# Patient Record
Sex: Female | Born: 1935 | Race: White | Hispanic: No | Smoking: Never smoker
Health system: Southern US, Community
[De-identification: ages and names within clinical notes are randomized; demographics above are authoritative.]

## PROBLEM LIST (undated history)

## (undated) DIAGNOSIS — I10 Essential (primary) hypertension: Secondary | ICD-10-CM

## (undated) DIAGNOSIS — E785 Hyperlipidemia, unspecified: Secondary | ICD-10-CM

## (undated) DIAGNOSIS — M1990 Unspecified osteoarthritis, unspecified site: Secondary | ICD-10-CM

## (undated) DIAGNOSIS — I2510 Atherosclerotic heart disease of native coronary artery without angina pectoris: Secondary | ICD-10-CM

## (undated) DIAGNOSIS — H269 Unspecified cataract: Secondary | ICD-10-CM

## (undated) HISTORY — PX: SPLENECTOMY, TOTAL: SHX788

## (undated) HISTORY — PX: BACK SURGERY: SHX140

## (undated) HISTORY — PX: CARPAL TUNNEL RELEASE: SHX101

## (undated) HISTORY — PX: ABDOMINAL HYSTERECTOMY: SHX81

## (undated) HISTORY — PX: APPENDECTOMY: SHX54

## (undated) HISTORY — PX: HERNIA REPAIR: SHX51

## (undated) HISTORY — PX: KNEE SURGERY: SHX244

## (undated) HISTORY — PX: SHOULDER SURGERY: SHX246

## (undated) HISTORY — DX: Unspecified cataract: H26.9

---

## 1998-09-01 ENCOUNTER — Ambulatory Visit (HOSPITAL_COMMUNITY): Admission: RE | Admit: 1998-09-01 | Discharge: 1998-09-01 | Payer: Self-pay | Admitting: Geriatric Medicine

## 1998-10-15 ENCOUNTER — Encounter: Payer: Self-pay | Admitting: Gastroenterology

## 1998-10-15 ENCOUNTER — Ambulatory Visit (HOSPITAL_COMMUNITY): Admission: RE | Admit: 1998-10-15 | Discharge: 1998-10-15 | Payer: Self-pay | Admitting: Gastroenterology

## 1998-11-04 ENCOUNTER — Other Ambulatory Visit: Admission: RE | Admit: 1998-11-04 | Discharge: 1998-11-04 | Payer: Self-pay | Admitting: Obstetrics and Gynecology

## 2000-03-17 ENCOUNTER — Other Ambulatory Visit: Admission: RE | Admit: 2000-03-17 | Discharge: 2000-03-17 | Payer: Self-pay | Admitting: Obstetrics and Gynecology

## 2000-06-17 ENCOUNTER — Encounter: Admission: RE | Admit: 2000-06-17 | Discharge: 2000-06-17 | Payer: Self-pay | Admitting: Geriatric Medicine

## 2000-06-17 ENCOUNTER — Encounter: Payer: Self-pay | Admitting: Geriatric Medicine

## 2000-10-24 ENCOUNTER — Encounter: Admission: RE | Admit: 2000-10-24 | Discharge: 2000-10-24 | Payer: Self-pay | Admitting: Neurosurgery

## 2000-10-24 ENCOUNTER — Encounter: Payer: Self-pay | Admitting: Neurosurgery

## 2000-10-28 ENCOUNTER — Ambulatory Visit (HOSPITAL_COMMUNITY): Admission: RE | Admit: 2000-10-28 | Discharge: 2000-10-28 | Payer: Self-pay | Admitting: Neurosurgery

## 2000-10-28 ENCOUNTER — Encounter: Payer: Self-pay | Admitting: Neurosurgery

## 2000-11-15 ENCOUNTER — Encounter: Payer: Self-pay | Admitting: Neurosurgery

## 2000-11-15 ENCOUNTER — Encounter: Admission: RE | Admit: 2000-11-15 | Discharge: 2000-11-15 | Payer: Self-pay | Admitting: Neurosurgery

## 2000-11-29 ENCOUNTER — Encounter: Admission: RE | Admit: 2000-11-29 | Discharge: 2000-11-29 | Payer: Self-pay | Admitting: Neurosurgery

## 2000-11-29 ENCOUNTER — Encounter: Payer: Self-pay | Admitting: Neurosurgery

## 2000-12-06 ENCOUNTER — Ambulatory Visit (HOSPITAL_COMMUNITY): Admission: RE | Admit: 2000-12-06 | Discharge: 2000-12-06 | Payer: Self-pay | Admitting: Interventional Cardiology

## 2000-12-13 ENCOUNTER — Encounter: Admission: RE | Admit: 2000-12-13 | Discharge: 2000-12-13 | Payer: Self-pay | Admitting: Neurosurgery

## 2000-12-13 ENCOUNTER — Encounter: Payer: Self-pay | Admitting: Neurosurgery

## 2001-04-05 ENCOUNTER — Other Ambulatory Visit: Admission: RE | Admit: 2001-04-05 | Discharge: 2001-04-05 | Payer: Self-pay | Admitting: Obstetrics and Gynecology

## 2001-04-14 ENCOUNTER — Encounter: Payer: Self-pay | Admitting: Neurosurgery

## 2001-04-14 ENCOUNTER — Encounter: Admission: RE | Admit: 2001-04-14 | Discharge: 2001-04-14 | Payer: Self-pay | Admitting: Neurosurgery

## 2002-07-18 ENCOUNTER — Encounter: Admission: RE | Admit: 2002-07-18 | Discharge: 2002-07-18 | Payer: Self-pay | Admitting: Geriatric Medicine

## 2002-07-18 ENCOUNTER — Encounter: Payer: Self-pay | Admitting: Geriatric Medicine

## 2003-05-13 ENCOUNTER — Encounter: Payer: Self-pay | Admitting: Geriatric Medicine

## 2003-05-13 ENCOUNTER — Encounter: Admission: RE | Admit: 2003-05-13 | Discharge: 2003-05-13 | Payer: Self-pay | Admitting: Geriatric Medicine

## 2003-06-12 ENCOUNTER — Ambulatory Visit (HOSPITAL_COMMUNITY): Admission: RE | Admit: 2003-06-12 | Discharge: 2003-06-12 | Payer: Self-pay | Admitting: Gastroenterology

## 2003-11-06 ENCOUNTER — Other Ambulatory Visit: Admission: RE | Admit: 2003-11-06 | Discharge: 2003-11-06 | Payer: Self-pay | Admitting: Obstetrics and Gynecology

## 2004-06-17 ENCOUNTER — Encounter: Admission: RE | Admit: 2004-06-17 | Discharge: 2004-06-17 | Payer: Self-pay | Admitting: Geriatric Medicine

## 2004-06-24 ENCOUNTER — Encounter: Admission: RE | Admit: 2004-06-24 | Discharge: 2004-06-24 | Payer: Self-pay | Admitting: Geriatric Medicine

## 2004-07-29 ENCOUNTER — Encounter: Admission: RE | Admit: 2004-07-29 | Discharge: 2004-07-29 | Payer: Self-pay | Admitting: Geriatric Medicine

## 2004-09-25 ENCOUNTER — Emergency Department (HOSPITAL_COMMUNITY): Admission: EM | Admit: 2004-09-25 | Discharge: 2004-09-25 | Payer: Self-pay | Admitting: Emergency Medicine

## 2005-01-08 ENCOUNTER — Ambulatory Visit (HOSPITAL_COMMUNITY): Admission: RE | Admit: 2005-01-08 | Discharge: 2005-01-08 | Payer: Self-pay | Admitting: Geriatric Medicine

## 2005-01-10 ENCOUNTER — Inpatient Hospital Stay (HOSPITAL_COMMUNITY): Admission: EM | Admit: 2005-01-10 | Discharge: 2005-01-22 | Payer: Self-pay | Admitting: Emergency Medicine

## 2005-01-12 ENCOUNTER — Encounter (INDEPENDENT_AMBULATORY_CARE_PROVIDER_SITE_OTHER): Payer: Self-pay | Admitting: Specialist

## 2005-06-29 ENCOUNTER — Ambulatory Visit (HOSPITAL_COMMUNITY): Admission: RE | Admit: 2005-06-29 | Discharge: 2005-06-29 | Payer: Self-pay | Admitting: Gastroenterology

## 2005-07-12 ENCOUNTER — Encounter: Admission: RE | Admit: 2005-07-12 | Discharge: 2005-07-12 | Payer: Self-pay | Admitting: Geriatric Medicine

## 2005-07-12 IMAGING — US US EXTREM LOW VENOUS*R*
1 series · 14 of 24 positions shown · non-contrast
Comparison: none

CLINICAL DATA: Right lower extremity pain and swelling.

RIGHT LOWER EXTREMITY VENOUS DUPLEX DOPPLER ULTRASOUND  [DATE]:
TECHNIQUE: Gray-scale sonography with compression and color and duplex Doppler
ultrasound were performed to evaluate the deep venous system from the level of
the common femoral vein through the popliteal and proximal calf veins. The
greater saphenous vein was also evaluated with compression.

[Series 1: unknown · 14 of 38 slices shown]
[im 1/38]
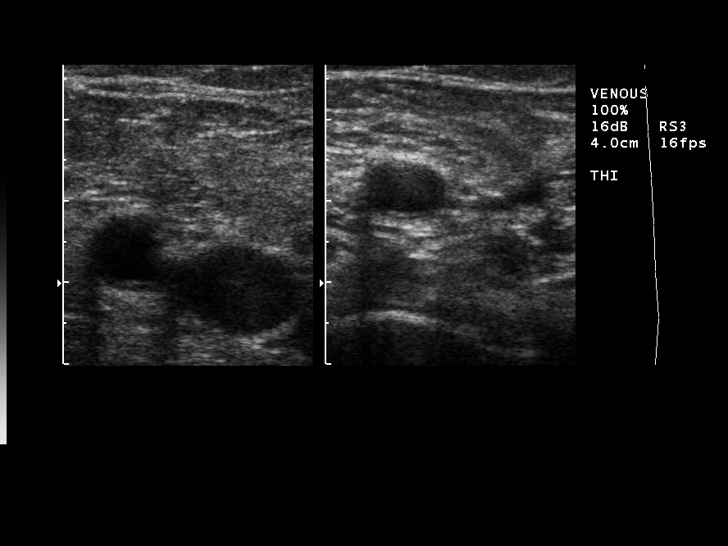
[im 4/38]
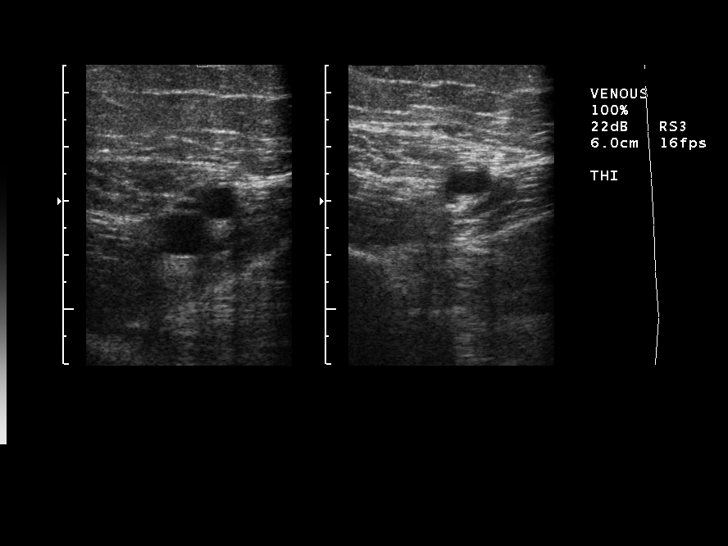
[im 7/38]
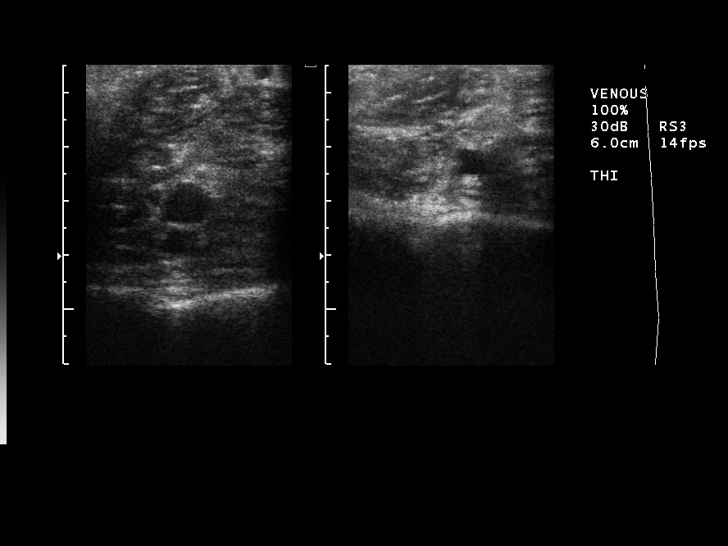
[im 10/38]
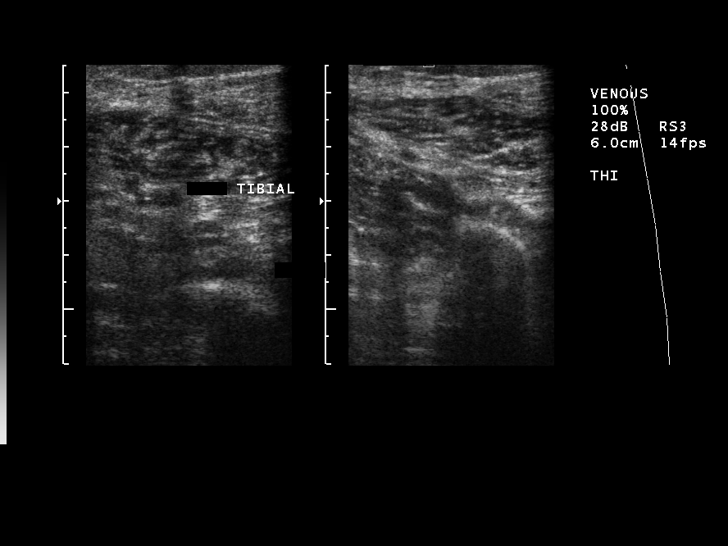
[im 12/38]
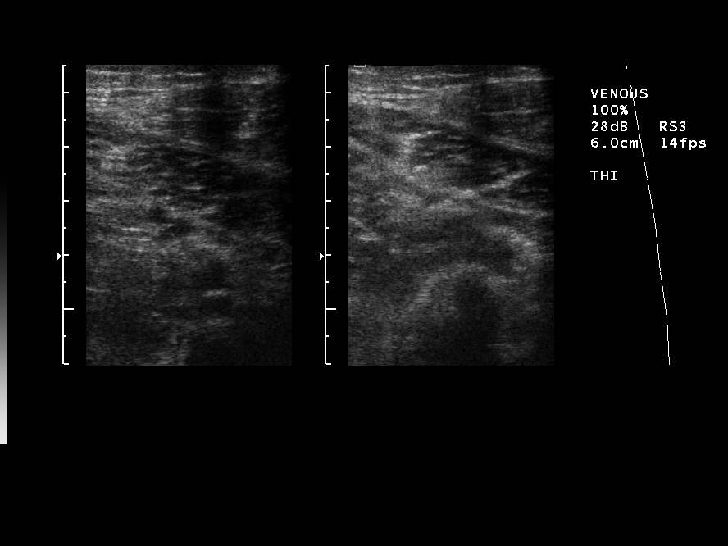
[im 15/38]
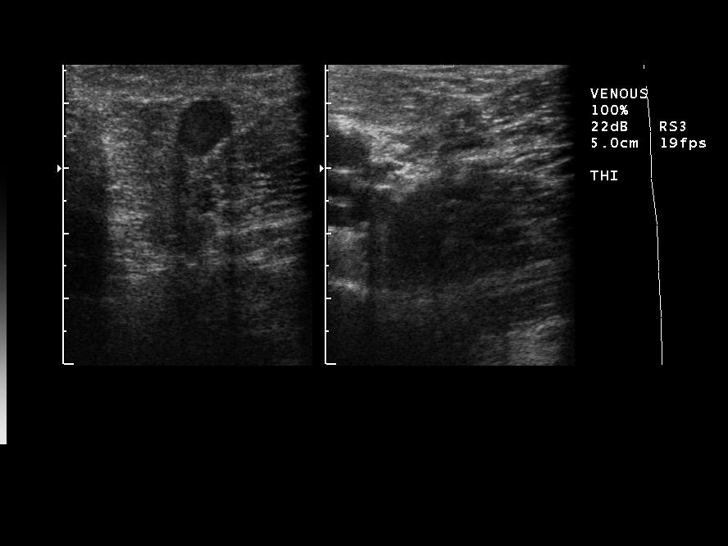
[im 18/38]
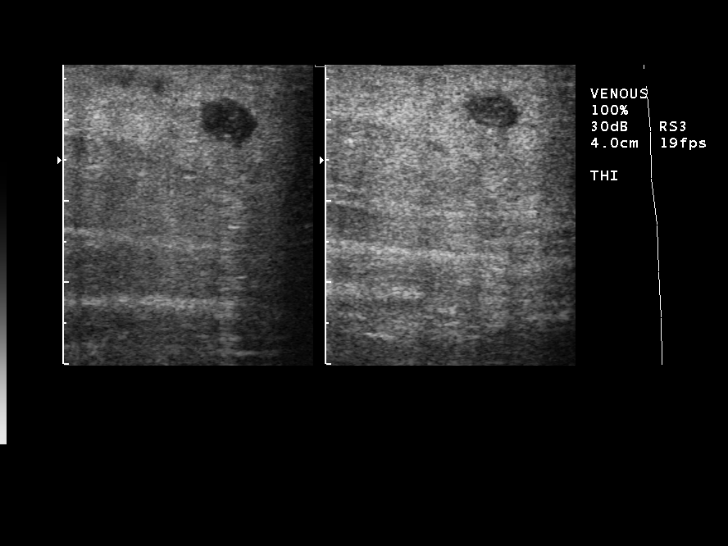
[im 20/38]
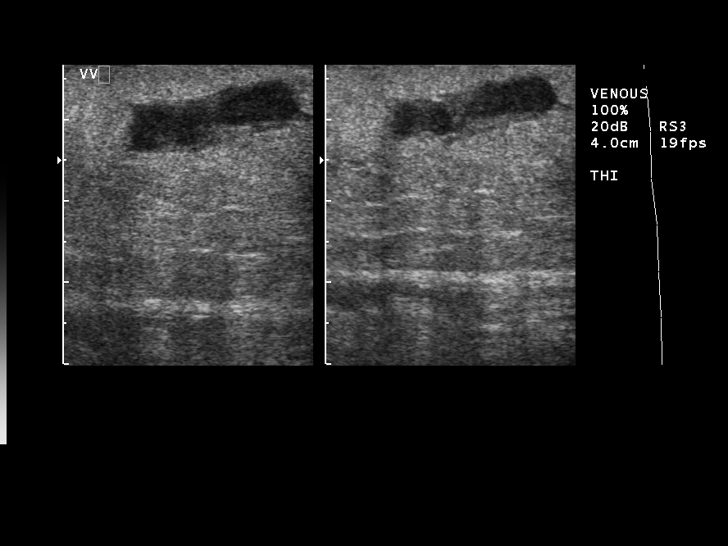
[im 23/38]
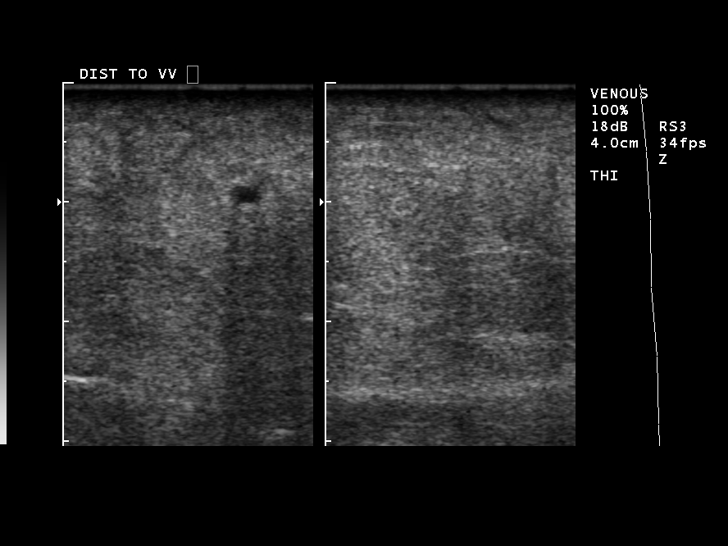
[im 26/38]
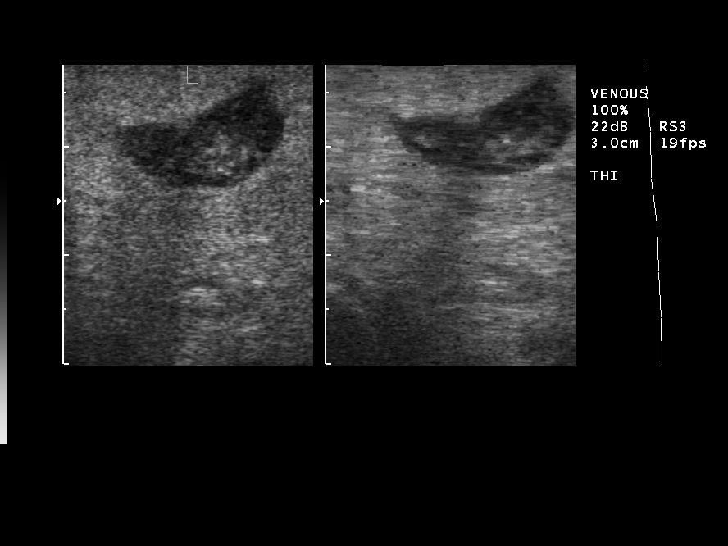
[im 29/38]
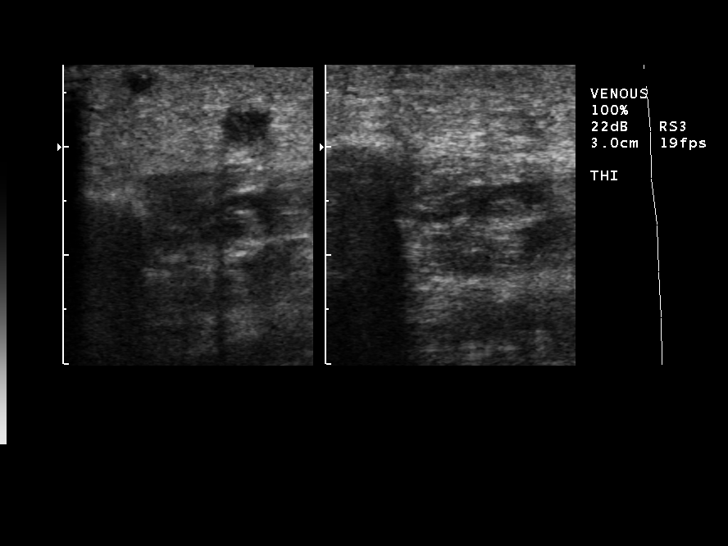
[im 31/38]
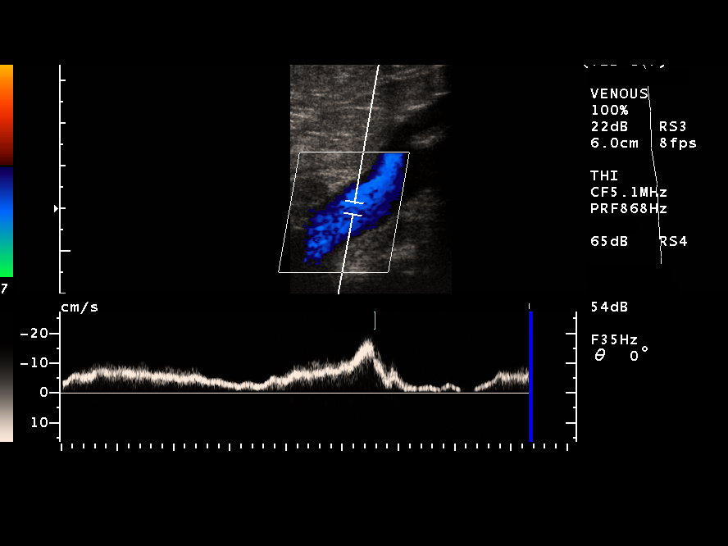
[im 34/38]
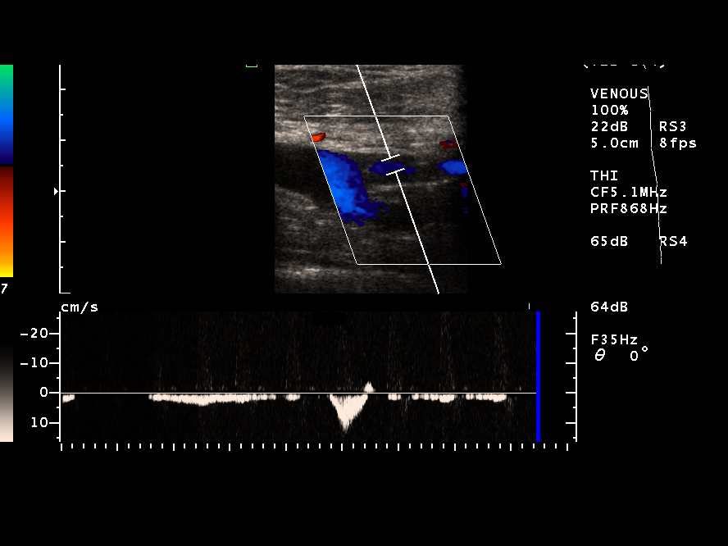
[im 38/38]
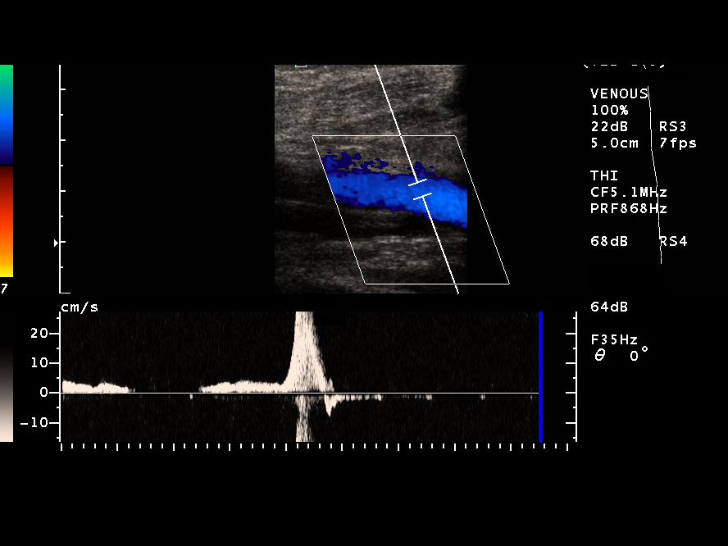

[14 of 24 positions shown; findings below may reference images not displayed]

FINDINGS: The deep venous system is widely patent, with normal compressibility
and normal augmentation to flow in all of the deep veins evaluated, including
the proximal calf veins. However, there is extensive thrombus involving the
greater saphenous vein in the lower leg below the knee. There are varicosities
versus collateral superficial veins in the lower leg.
IMPRESSION: 1. No evidence of deep venous thrombosis.
2. Superficial thrombophlebitis involving the greater saphenous vein below the
knee. There are varicosities and/or collateral superficial veins in the lower
leg.

## 2006-02-14 ENCOUNTER — Encounter: Admission: RE | Admit: 2006-02-14 | Discharge: 2006-02-14 | Payer: Self-pay | Admitting: Orthopedic Surgery

## 2006-02-17 ENCOUNTER — Encounter (INDEPENDENT_AMBULATORY_CARE_PROVIDER_SITE_OTHER): Payer: Self-pay | Admitting: Specialist

## 2006-02-17 ENCOUNTER — Ambulatory Visit (HOSPITAL_BASED_OUTPATIENT_CLINIC_OR_DEPARTMENT_OTHER): Admission: RE | Admit: 2006-02-17 | Discharge: 2006-02-17 | Payer: Self-pay | Admitting: Orthopedic Surgery

## 2006-11-09 ENCOUNTER — Encounter: Admission: RE | Admit: 2006-11-09 | Discharge: 2006-11-09 | Payer: Self-pay | Admitting: Orthopedic Surgery

## 2008-03-25 ENCOUNTER — Encounter: Admission: RE | Admit: 2008-03-25 | Discharge: 2008-03-25 | Payer: Self-pay | Admitting: Orthopedic Surgery

## 2008-03-28 ENCOUNTER — Ambulatory Visit (HOSPITAL_BASED_OUTPATIENT_CLINIC_OR_DEPARTMENT_OTHER): Admission: RE | Admit: 2008-03-28 | Discharge: 2008-03-29 | Payer: Self-pay | Admitting: Orthopedic Surgery

## 2010-06-04 ENCOUNTER — Ambulatory Visit (HOSPITAL_COMMUNITY): Admission: RE | Admit: 2010-06-04 | Discharge: 2010-06-04 | Payer: Self-pay | Admitting: Gastroenterology

## 2010-12-29 NOTE — Op Note (Signed)
[name], [name]                ACCOUNT NO.:  [id number]   MEDICAL RECORD NO.:  [id number]          PATIENT TYPE:  AMB   LOCATION:  DSC                          FACILITY:  [facility]   PHYSICIAN:  Katy Fitch. Sypher, M.D. DATE OF BIRTH:  November 18, 1928   DATE OF PROCEDURE:  03/28/2008  DATE OF DISCHARGE:                               OPERATIVE REPORT   PREOPERATIVE DIAGNOSES:  1. Right rotator cuff tear, documented by preoperative MRI.  2. Advanced degenerative arthritis. right acromioclavicular joint.  3. Unfavorable acromial anatomy.   POSTOPERATIVE DIAGNOSES:  1. Degenerative rotator cuff tear involving entire supraspinatus and      infraspinatus rotator cuff tendons.  2. Chronic acromioclavicular arthropathy.  3. Unfavorable acromial anatomy.  4. Synovitis and minimal adhesive capsulitis, right glenohumeral      joint.   OPERATION:  1. Examination of right shoulder under anesthesia.  2. Diagnostic arthroscopy, right glenohumeral joint followed by      arthroscopic debridement of adhesive capsulitis and deep surface of      rotator cuff tear with photographic documentation of rotator cuff      tear.  3. Arthroscopic subacromial decompression with coracoacromial ligament      relaxation, acromioplasty, and bursectomy.  4. Arthroscopic distal clavicle resection.  5. Open repair of supraspinatus and infraspinatus rotator cuff tear      utilizing two medial footprint Bio-Corkscrew anchors and suture-      bridge technique followed by over-the-top repair to lateral      SwiveLock and a single McLaughlin through bone suture to lateralize      the conjoined tendon.   OPERATION SURGEON:  Katy Fitch. Sypher, MD   ASSISTANT:  Marveen Reeks Dasnoit, PA-C   ANESTHESIA:  General endotracheal supplemented by a right interscalene  block.   SUPERVISING ANESTHESIOLOGIST:  Zenon Mayo, MD   INDICATIONS:  [name] is a 77 year old woman well known to our  practice.  We have cared for  her for number of upper extremity  orthopedic predicaments over the years.  Her primary care physician is  Hal Stoneking.   Recently, she presented for evaluation of a chronically painful right  shoulder.  Clinical examination revealed signs of significant AC  arthropathy, impingement and weakness of abduction and external  rotation.   Plain x-rays confirmed AC arthropathy and unfavorable acromial anatomy.  She had sclerotic change in greater tuberosity consistent with a rotator  cuff tear.   An MRI of the shoulder confirmed a retracted rotator cuff tear.   Due to her failure to respond to nonoperative measures, she is brought  to the operating at this time anticipating reconstruction of her right  rotator cuff and appropriate decompression.   PROCEDURE:  [name] was brought to the operating room and placed in  supine position on the operating table.   Preoperatively, she was interviewed by Dr. Sampson Goon of the Anesthesia  Service.  He advised her through informed consent of general anesthesia  by endotracheal technique supplemented by right interscalene block.  After her consent was achieved, an interscalene block was placed in the  holding  area without complication.  She was brought to room-1, placed in  supine position on the operating table, and under Dr. Jarrett Ables  direct supervision, general anesthesia induced by endotracheal  technique.  [name] was positioned in the beach chair position with the  aide of a torso and head holder designed for shoulder arthroscopy.  The  entire right upper extremity and forequarter prepped with DuraPrep and  draped with impervious arthroscopy drapes.   The shoulder was distended with 20 mL of sterile saline with a spinal  needle followed by placing the arthroscope through a standard posterior  viewing portal.  Diagnostic arthroscopy revealed moderate adhesive  capsulitis changes and synovitis.  An anterior port was created under   direct vision, and a synovectomy accomplished as well as limited  debridement of adhesive capsulitis tissues, tenolysing the subscapularis  tendon.   The deep surface of the rotator cuff was debrided and a full-thickness  tear involving the entire supraspinatus and infraspinatus tendon was  confirmed.   This was documented in the digital camera followed by removal the  arthroscope from the glenohumeral joint and placement in the subacromial  space.   After bursectomy was accomplished, coracoacromial ligament was relaxed  with the cutting cautery followed by leveling of the acromion to a type  1 morphology.  The distal 15 mm of clavicle was resected  arthroscopically.  After hemostasis was achieved, we proceeded to open  repair of the cuff.  A 4-cm muscle-splitting incision was fashioned  between the anterior and middle thirds of deltoid.  The bursa was  resected followed by debridement of the margin of the tear.  The deep  surface was debrided with a suction shaver and a rongeur.  The greater  tuberosity was decorticated and its profile lowered about 3 mm with a  power bur followed by placement 2 medial anchors utilizing 5.5-mm PEEK  and FiberWire.  The 4-0 medial mattress sutures were placed insetting  the margin of the repair anatomically followed by use of McLaughlin  through bone suture to lateralize the conjoined tendon.  The repair was  finished with a pair of over-the-top sutures to a lateral SwiveLock.   A low profile repair was achieved.  The lateral margin of the acromion  was examined and found to be not problematic.  The deltoid split was  then repaired with simple suture of 0 Vicryl and an apical corner suture  grasping style of #2 FiberWire.   The skin was repaired with subdermal sutures of 2-0 Vicryl and  intradermal 3-0 Prolene with Steri-Strips.   [name] was placed in sling and transferred to recovery room with  stable vital signs.  She will be admitted to  recovery care center for  appropriate management of her pain with IV PCA morphine on a p.r.n.  basis and p.o. medications as ordered.   She was provided Ancef 1 g IV q.8 h. x3 doses as a prophylactic  antibiotic.   We anticipate discharge 24 hours to home.  We will reexamine her on the  morning of March 29, 2008, prior to discharge.      Katy Fitch Sypher, M.D.  Electronically Signed     RVS/MEDQ  D:  03/28/2008  T:  03/29/2008  Job:  [id number]   cc:   Hal T. Stoneking, M.D.

## 2011-01-01 NOTE — H&P (Signed)
NAMEEMMALIN, JAQUESS NO.:  [id number]   MEDICAL RECORD NO.:  [id number]          PATIENT TYPE:  INP   LOCATION:  1830                         FACILITY:  [facility]   PHYSICIAN:  Gabrielle Dare. Janee Morn, M.D.DATE OF BIRTH:  1929/01/28   DATE OF ADMISSION:  01/10/2005  DATE OF DISCHARGE:                                HISTORY & PHYSICAL   CHIEF COMPLAINT:  Painful hernia.   HISTORY OF PRESENT ILLNESS:  The patient is a 77 year old white female with  a multiple year history of lower abdominal hernia associated with her  previous Pfannenstiel incision. She presents complaining of increased pain  and nausea since Friday. She was evaluated by her primary care physician on  Friday who sent her for a HIDA scan. This was unremarkable. She continued to  have pain and nausea through the weekend and she felt some redness over her  lower abdomen over the hernia and she came to [facility]  Emergency Department.   Abdominal series shows dilated colon and small bowel. White blood cell count  is elevated at 17.2. She continues to have pain.   PAST MEDICAL HISTORY:  1.  Hypertension.  2.  Hypercholesterolemia.   PAST SURGICAL HISTORY:  1.  Hysterectomy.  2.  Bladder tacking.  3.  Splenectomy.   MEDICATIONS:  Premarin, Ditropan, hydrochlorothiazide 25 mg daily, Bextra,  Zetia, and Ocuvite.   PHYSICAL EXAMINATION:  GENERAL:  She is awake and alert. She is in mild  distress.  NECK:  Supple.  HEENT:  Pupils are reactive and her sclerae are clear.  LUNGS:  Clear to auscultation. Respiratory excursion is normal.  HEART:  Regular rate and rhythm. No murmurs are noted.  ABDOMEN:  Soft but the lower abdomen has a palpable hernia just to the left  of the midline and some erythema of the overlying skin extending across the  left lower quadrant. Hernia is not reducible and is tender. Bowel sounds  were present and occasionally high pitched. No organomegaly noted.  SKIN:   Warm and dry.  MUSCULOSKELETAL:  Strength is equal in the upper and lower extremities  without noted deficit.   LABORATORY DATA:  Data reviewed includes abdominal films as above. White  blood cell count 17.2, hemoglobin 15.9, platelets 194,000. PT is 12.8, INR  1.0. Chemistry panel is pending. EKG is also pending.   IMPRESSION:  A 77 year old female with an incarcerated incisional hernia  with possible ischemic bowel.   PLAN:  Plan will be to take her to the OR for emergency exploration and  repair of this hernia. She may also require bowel resection and possible  colostomy. The procedure, risks, and benefits were discussed in detail with  the patient and her son and she is agreeable with proceeding.      BET/MEDQ  D:  01/10/2005  T:  01/10/2005  Job:  [id number]

## 2011-01-01 NOTE — Op Note (Signed)
[name], [name]                ACCOUNT NO.:  [id number]   MEDICAL RECORD NO.:  [id number]          PATIENT TYPE:  AMB   LOCATION:  DSC                          FACILITY:  [facility]   PHYSICIAN:  [name], M.D. DATE OF BIRTH:  22-Jun-1929   DATE OF PROCEDURE:  02/17/2006  DATE OF DISCHARGE:                                 OPERATIVE REPORT   PREOPERATIVE DIAGNOSES:  1.  Chronic entrapment neuropathy, right median nerve at carpal tunnel.  2.  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   POSTOPERATIVE DIAGNOSES:  1.  Chronic entrapment neuropathy, right median nerve at carpal tunnel.  2.  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  Identification of a complex      ganglion developing on the wall of the arterial branch of the dorsal      radial artery perforating the first web between the adductor pollicis      and first dorsal interosseous muscles.   OPERATION:  1.  Release of right transverse carpal ligament.  2.  Resection of complex first web ganglion.   OPERATIONS:  [name], M.D.  No assistant.   ANESTHESIA:  General by LMA.  Supervising anesthesiologist is Dr. Krista Blue.   INDICATIONS:  [name] is a 77 year old woman referred through the  courtesy of Dr. Merlene [name] for evaluation and management of hand numbness  and a mass on the dorsal aspect of her right thumb index webspace.   Clinical examination and past electrodiagnostic studies have confirmed  carpal tunnel syndrome, unresponsive to nonoperative measures including  splinting, activity modification and ulnar bursa injection with steroid.   Also [name] noted a mass developing in her thumb index web space  consistent with a ganglion associated with the arterial branch connecting  the dorsal radial artery to the deep palmar arch.   She requested  that we resect this ganglion if possible.   I advised that these could be complex and often involve the arterial  structures in this region.  Sometimes they also are noted to have a origin  on the carpometacarpal joint capsules on the palmar aspect of the hand and  can be difficult to eradicate.   After informed consent, she is brought to the operating room at this time.  Preoperatively we discussed potential risks and benefits of surgery in  detail.   PROCEDURE:  [name] is brought to the operating room and placed in  supine position on the operating table.   Following the induction of general anesthesia by LMA technique, the right  arm was prepped with Betadine soap solution, sterilely draped.   Following exsanguination of the right arm with Esmarch bandage, the arterial  tourniquet slated to inflated to 220 mmHg later elevated to 250 mmHg due to  systolic hypertension.   The procedure commenced with short incision in the line of the ring finger  in the palm.  The  subcutaneous tissues were carefully divided revealing the  palmar fascia.  This split longitudinally to reveal common sensory branch of  median nerve.  These were followed back to transverse carpal ligament.  The  superficial palmar arch and ulnar artery identified.  The ulnar aspect of  the transverse carpal ligament was released with scissors extending into the  distal forearm including subcutaneous release of the volar forearm fascia.   This widely opened the carpal canal.   Due to elevation of the systolic blood pressure we noted some bleeding.  Therefore the tourniquet released and the arm was re-exsanguinated with  reinflation of the tourniquet to 250 mmHg.   The palmar wound was repaired with intradermal 3-0 Prolene and Steri-Strips.  2% lidocaine was infiltrated for postoperative analgesia along the wound  margins.  Attention was then directed to the dorsal aspect of the hand.  The  ganglion was easily  palpable between the abductor pollicis and first dorsal  interosseous muscles.  The skin was incised longitudinally directly over the  mass.  The dorsal radial sensory branch to the index metacarpal region was  gently identified and retracted in radial direction.  Several dorsal veins  were dissected free from the mass.  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  This circumferentially dissected  and was found to be exiting a fascial plane adjacent to the perforating  branch of the dorsal radial artery connecting to the deep palmar arch.  This  directly involved the wall the artery.  This circumferentially dissected  through the adventitia of the artery and after circumferential dissection  was removed.  Bleeding points along the margin of the arterial adventitia  were electrocauterized with bipolar current.  Care was taken not to violate  the muscular layer nor the intima.   Past experience with similar lesions has revealed a relatively high risk of  recurrence.  We will share this with the [name] family postoperatively.  I did  not make an attempt to follow the ganglion into the deep palmar space.   The wound was then inspected for bleeding points, tourniquet released, there  was no bleeding noted, followed by repair the skin with intradermal 3-0  Prolene and Steri-Strips.  2% lidocaine was infiltrated for postoperative  analgesia followed by dressing the hand with sterile gauze, sterile Webril  and a volar plaster splint maintaining the wrist in 10 degrees of  dorsiflexion.   There were no apparent complications.      [name], M.D.  Electronically Signed     RVS/MEDQ  D:  02/17/2006  T:  02/17/2006  Job:  [id number]

## 2011-01-01 NOTE — Cardiovascular Report (Signed)
. [facility]  Patient:    [name], [name]                       MRN: [id number] Proc. Date: 12/06/00 Adm. Date:  [id number] Attending:  Lyn Records. Iii CC:         Hal T. Stoneking, M.D.   Cardiac Catheterization  INDICATION FOR STUDY:  Exertional dyspnea, chest discomfort and abnormal Cardiolite.  PROCEDURE PERFORMED: 1. Left heart catheterization. 2. Selective coronary angiography. 3. Left ventriculography.  DESCRIPTION OF PROCEDURE:  After informed consent, a 6-French sheath was inserted into the right femoral artery using the modified Seldinger technique. A 6-French A2 multipurpose catheter was used for left ventriculography by hand injection and right coronary angiography.  The hemodynamic recordings were also made with this catheter.  A 6-French #4 left Judkins catheter was used for left coronary angiography.  The patient tolerated the procedure without complications.  RESULTS:  I.  HEMODYNAMIC DATA:     A. Aortic pressure: 161/75.     b. Left ventricular pressure: 161/19.  II. LEFT VENTRICULOGRAPHY:  Left ventricular cavity is small.  Overall LV function was normal.  EF is 70%.  No mitral regurgitation was noted.  III. CORONARY ANGIOGRAPHY:      A. Left main coronary:  Normal.      B. Left anterior descending coronary artery:  The LAD is relatively         small.  It reaches the apex, but does not go around the apex.  There         is a very eccentric, somewhat complex proximal LAD lesion that         angiographically appeared to be no more than 50% in multiple views.         The diagonal, which arises 3-4 mm beyond this contained a segmental         50-60% proximal narrowing.  Antegrade flow throughout the LAD was         brisk.      C. Circumflex artery:  Three obtuse marginal branches arise from a normal         circumflex.  The obtuse marginal branches are normal.      D. Right coronary artery:  The right coronary artery  was large and free         of any significant obstruction.  A large PDA and left ventricular         branch arise from it.  CONCLUSIONS: 1. Normal LV function. 2. Moderate to moderately severe coronary artery disease with an eccentric    50-60% proximal LAD and 60% first diagonal lesion.  PLAN:  Medical therapy to include risk factor modification with statins therapy and aspirin therapy.  If symptoms become aggressive and/or refractory coronary intervention on this particular lesion would be easy and quite approachable. DD:  12/06/00 TD:  12/06/00 Job: [id number]

## 2011-01-01 NOTE — Discharge Summary (Signed)
NAMENAYA, ILAGAN NO.:  [id number]   MEDICAL RECORD NO.:  [id number]          PATIENT TYPE:  INP   LOCATION:  5742                         FACILITY:  [facility]   PHYSICIAN:  Guy Franco, P.A.       DATE OF BIRTH:  03-22-1929   DATE OF ADMISSION:  01/10/2005  DATE OF DISCHARGE:                                 DISCHARGE SUMMARY   DISCHARGE DIAGNOSES:  1.  Incarcerated incisional hernia, necrotic omentum with a viable sigmoid      colon, status post exploratory laparotomy, partial omentectomy and      repair of incarcerated incisional hernia by Dr. Violeta Gelinas on Jan 10, 2005.  2.  Hypertension, treated.  3.  Hypercholesterolemia, treated.  4.  Status post hysterectomy.  5.  Status post bladder tacking.  6.  Status post splenectomy.   HOSPITAL COURSE:  [name] is a 77 year old female with a multiple year  history of abdominal hernia and had a several day history of increased pain  in that area.  She was evaluated by her primary care physician who  apparently felt this may be her gallbladder.  She was sent for a HIDA scan  that was unremarkable.  She continued to have pain, as well as nausea over  the next several days and the proceeded to the emergency room.  The  abdominal series showed dilated colon, as well as small bowel.  Her white  count was elevated at 17,200.  On exam, she was found to have an  incarcerated incisional hernia and a question of possible ischemic bowel.   She was taken to the operating room on the same day and she was found to  have an incarcerated incisional hernia with necrotic omentum.  She underwent  an exploratory laparotomy, partial omentectomy and repair of the  incarcerated incisional hernia by Dr. Violeta Gelinas.  She seemed to  tolerate the procedure well and was transferred to the surgical ward.  Postoperatively, she did have some redness around her incision and she was  given IV Ancef.  The incision was clean with no  redness or erythema by  discharge.  Her diet was gradually increased over the next several days and  by postoperative day #10, it was felt that she was ready for discharge to  home.  She did have some elevated blood pressures during her hospital stay.  I did speak with Dr. Pete Glatter, who asked me to have her follow up  immediately after her hospitalization, so that he could treat her as an  outpatient for her blood pressure.   DISCHARGE MEDICATIONS:  1.  Continue hydrochlorothiazide, Bextra, Zetia, Ditropan and Premarin.  2.  Her new medications include Protonix 40 mg a day, Reglan q.i.d., Vicodin      1-2 tablets q.6h. as needed for pain.   She is not to lift over 10 pounds.  She is to remain on a low fat diet.  She  is to clean her incisions gently with soap and water and call for  temperature greater than 101.  I have asked her to follow up with Dr. Violeta Gelinas in the next two to three weeks and I have made this appointment for  her.      LB/MEDQ  D:  01/20/2005  T:  01/20/2005  Job:  [id number]   cc:   Gabrielle Dare. Janee Morn, M.D.  [facility]  [address] [place], [place] [address]   Hal T. Stoneking, M.D.  [address] [place], [place] [address]  Fax: [phone]

## 2011-01-01 NOTE — Op Note (Signed)
[name], [name] NO.:  [id number]   MEDICAL RECORD NO.:  [id number]          PATIENT TYPE:  INP   LOCATION:  1830                         FACILITY:  [facility]   PHYSICIAN:  [name]. [name], M.D.DATE OF BIRTH:  01-12-1929   DATE OF PROCEDURE:  01/10/2005  DATE OF DISCHARGE:                                 OPERATIVE REPORT   PREOPERATIVE DIAGNOSIS:  Incarcerated incisional hernia.   POSTOPERATIVE DIAGNOSES:  1.  Incarcerated incisional hernia.  2.  Necrotic omentum.  3.  Viable sigmoid colon.   PROCEDURES:  Exploratory laparotomy, partial omentectomy, repair of  incarcerated incisional hernia.   SURGEON:  [name]. [name], M.D.   ASSISTANT:  [name], M.D.   ANESTHESIA:  General.   HISTORY OF PRESENT ILLNESS:  The patient is a 77 year old white female with  a multiple-year history of a lower abdominal incisional hernia from a  Pfannenstiel incision who presented today complaining of increased pain and  nausea since Friday.  She was evaluated by her primary M.D., who obtained A  HIDA scan which was negative. She continued to have nausea and increasing  pain over her hernia sac in her lower abdomen. She developed some redness of  the skin today and came to the emergency department. Abdominal films were  consistent with colonic obstruction, and her physical exam was consistent  with an incarcerated incisional hernia with concern for ischemic bowel due  to the erythema. She was brought emergently to the operating room.   PROCEDURE IN DETAIL:  Informed consent was obtained. She received  intravenous antibiotics. She was identified and brought to the operating  room. General anesthesia was administered. A lower midline incision was  made. Subcutaneous tissues were dissected down to expose the fascia. The  hernia was encountered. It was a large ball covered with a hernia sac. This  was circumferentially dissected, and the fascia was opened with a  vertical  incision extending from the neck of the hernia upward several centimeters.  The remainder of the hernia was circumferentially dissected off the fascia,  and the hernia sac was then opened, revealing a large piece of necrotic  omentum. This was taken down with Kelly clamps and removed. The pedicles  were tied securely with 2-0 silk. The remainder of the contents was a loop  of sigmoid colon which was a little erythematous but viable. There was an  appendix epiploica on a long stalk that a chronic tip. This was removed, and  the base was tied with 2-0 silk. The colon appeared otherwise intact. The  colon was reduced back into the abdomen. The NG tube was checked to be in  good position. The hernia sac was then excised with the Bovie cautery  getting excellent hemostasis. The decision was made not to use the mesh  considering the character of the omentum and the rest of this case. The  fascia was then cleaned a little bit more, and then the fascial defect was  then closed accomplishing the hernia repair with interrupted figure-of-eight  #1 Novofil sutures. This was accomplished without damaging any other intra-  abdominal contents. Prior to completion of the closure, the sigmoid colon  was rechecked and continued to remain viable. The  fascial closure was completed. The subcutaneous tissues were copiously  irrigated, and the skin was closed with staples. Sponge, needle, and  instrument counts were correct. A sterile dressing was applied. The patient  tolerated the procedure well without apparent complication and was taken to  the recovery in stable condition.      BET/MEDQ  D:  01/10/2005  T:  01/10/2005  Job:  [id number]   cc:   [name], M.D.  [address] [place], [place] [address]  Fax: [phone]

## 2011-01-01 NOTE — Op Note (Signed)
   NAME:  [name], [name]                          ACCOUNT NO.:  [id number]   MEDICAL RECORD NO.:  [id number]                   PATIENT TYPE:  AMB   LOCATION:  ENDO                                 FACILITY:  [facility]   PHYSICIAN:  Danise Edge, M.D.                DATE OF BIRTH:  [birth year]   DATE OF PROCEDURE:  06/12/2003  DATE OF DISCHARGE:                                 OPERATIVE REPORT   PROCEDURE INDICATION:  [name] is a 77 year old female born  1928/10/18.  [name] is scheduled to undergo her first screening  colonoscopy with polypectomy to prevent colon cancer.   ENDOSCOPIST:  Danise Edge, M.D.   PREMEDICATION:  Demerol 70 mg, Versed 10 mg.   PROCEDURE:  After obtaining informed consent, [name] was placed in the  left lateral decubitus position.  I administered intravenous Demerol and  intravenous Versed to achieve conscious sedation for the procedure.  The  patient's blood pressure, oxygen saturation, and cardiac rhythm were  monitored throughout the procedure and documented in the medical record.   Anal inspection was normal.  Digital rectal exam was normal.  The Olympus  adjustable pediatric colonoscope was introduced into the rectum and advanced  to the cecum.  A normal-appearing ileocecal valve was intubated and the  distal ileum inspected.  Colonic preparation for the exam today was  excellent.   Rectum:  normal.   Sigmoid colon and descending colon.  Left colonic diverticulosis without  diverticulitis or diverticular stricture formation.   Splenic flexure:  normal.   Transverse colon:  normal.   Hepatic flexure:  normal.   Ascending colon:  normal.   Cecum and ileocecal valve:  normal.   Distal ileum:  normal.    ASSESSMENT:  Normal screening proctocolonoscopy to the cecum.  No endoscopic  evidence for the presence of colorectal neoplasia.                                               Danise Edge, M.D.    MJ/MEDQ  D:   06/12/2003  T:  06/12/2003  Job:  [id number]   cc:   Hal T. Stoneking, M.D.  [address] [place], [place] [address]  Fax: [phone]

## 2011-05-14 LAB — BASIC METABOLIC PANEL
BUN: 23
CO2: 26
Calcium: 10
Chloride: 106
Creatinine, Ser: 1
GFR calc Af Amer: >60
GFR calc non Af Amer: 54 — ABNORMAL LOW
Glucose, Bld: 103 — ABNORMAL HIGH
Potassium: 4.1
Sodium: 140

## 2011-05-14 LAB — POCT HEMOGLOBIN-HEMACUE: Hemoglobin: 13.5

## 2011-08-27 DIAGNOSIS — M76899 Other specified enthesopathies of unspecified lower limb, excluding foot: Secondary | ICD-10-CM | POA: Diagnosis not present

## 2011-09-06 DIAGNOSIS — M79609 Pain in unspecified limb: Secondary | ICD-10-CM | POA: Diagnosis not present

## 2011-09-06 DIAGNOSIS — M76899 Other specified enthesopathies of unspecified lower limb, excluding foot: Secondary | ICD-10-CM | POA: Diagnosis not present

## 2011-09-08 DIAGNOSIS — M76899 Other specified enthesopathies of unspecified lower limb, excluding foot: Secondary | ICD-10-CM | POA: Diagnosis not present

## 2011-09-08 DIAGNOSIS — M79609 Pain in unspecified limb: Secondary | ICD-10-CM | POA: Diagnosis not present

## 2011-09-10 DIAGNOSIS — M79609 Pain in unspecified limb: Secondary | ICD-10-CM | POA: Diagnosis not present

## 2011-09-10 DIAGNOSIS — M76899 Other specified enthesopathies of unspecified lower limb, excluding foot: Secondary | ICD-10-CM | POA: Diagnosis not present

## 2011-09-13 DIAGNOSIS — M79609 Pain in unspecified limb: Secondary | ICD-10-CM | POA: Diagnosis not present

## 2011-09-13 DIAGNOSIS — M76899 Other specified enthesopathies of unspecified lower limb, excluding foot: Secondary | ICD-10-CM | POA: Diagnosis not present

## 2011-09-15 DIAGNOSIS — M76899 Other specified enthesopathies of unspecified lower limb, excluding foot: Secondary | ICD-10-CM | POA: Diagnosis not present

## 2011-09-15 DIAGNOSIS — M79609 Pain in unspecified limb: Secondary | ICD-10-CM | POA: Diagnosis not present

## 2011-09-17 DIAGNOSIS — M76899 Other specified enthesopathies of unspecified lower limb, excluding foot: Secondary | ICD-10-CM | POA: Diagnosis not present

## 2011-09-17 DIAGNOSIS — M79609 Pain in unspecified limb: Secondary | ICD-10-CM | POA: Diagnosis not present

## 2011-09-30 DIAGNOSIS — N951 Menopausal and female climacteric states: Secondary | ICD-10-CM | POA: Diagnosis not present

## 2011-09-30 DIAGNOSIS — I129 Hypertensive chronic kidney disease with stage 1 through stage 4 chronic kidney disease, or unspecified chronic kidney disease: Secondary | ICD-10-CM | POA: Diagnosis not present

## 2011-09-30 DIAGNOSIS — E669 Obesity, unspecified: Secondary | ICD-10-CM | POA: Diagnosis not present

## 2011-09-30 DIAGNOSIS — N183 Chronic kidney disease, stage 3 unspecified: Secondary | ICD-10-CM | POA: Diagnosis not present

## 2011-09-30 DIAGNOSIS — Z0000 Encounter for general adult medical examination without abnormal findings: Secondary | ICD-10-CM | POA: Diagnosis not present

## 2011-09-30 DIAGNOSIS — Z79899 Other long term (current) drug therapy: Secondary | ICD-10-CM | POA: Diagnosis not present

## 2011-09-30 DIAGNOSIS — E782 Mixed hyperlipidemia: Secondary | ICD-10-CM | POA: Diagnosis not present

## 2011-10-18 DIAGNOSIS — Z780 Asymptomatic menopausal state: Secondary | ICD-10-CM | POA: Diagnosis not present

## 2011-10-26 DIAGNOSIS — M76899 Other specified enthesopathies of unspecified lower limb, excluding foot: Secondary | ICD-10-CM | POA: Diagnosis not present

## 2011-12-21 DIAGNOSIS — M76899 Other specified enthesopathies of unspecified lower limb, excluding foot: Secondary | ICD-10-CM | POA: Diagnosis not present

## 2011-12-22 DIAGNOSIS — H18519 Endothelial corneal dystrophy, unspecified eye: Secondary | ICD-10-CM | POA: Diagnosis not present

## 2011-12-22 DIAGNOSIS — H26499 Other secondary cataract, unspecified eye: Secondary | ICD-10-CM | POA: Diagnosis not present

## 2011-12-22 DIAGNOSIS — H538 Other visual disturbances: Secondary | ICD-10-CM | POA: Diagnosis not present

## 2011-12-22 DIAGNOSIS — H04129 Dry eye syndrome of unspecified lacrimal gland: Secondary | ICD-10-CM | POA: Diagnosis not present

## 2011-12-22 DIAGNOSIS — Z961 Presence of intraocular lens: Secondary | ICD-10-CM | POA: Diagnosis not present

## 2011-12-22 DIAGNOSIS — H40019 Open angle with borderline findings, low risk, unspecified eye: Secondary | ICD-10-CM | POA: Diagnosis not present

## 2012-02-02 DIAGNOSIS — H26499 Other secondary cataract, unspecified eye: Secondary | ICD-10-CM | POA: Diagnosis not present

## 2012-03-15 DIAGNOSIS — H40019 Open angle with borderline findings, low risk, unspecified eye: Secondary | ICD-10-CM | POA: Diagnosis not present

## 2012-03-15 DIAGNOSIS — Z961 Presence of intraocular lens: Secondary | ICD-10-CM | POA: Diagnosis not present

## 2012-03-15 DIAGNOSIS — H26499 Other secondary cataract, unspecified eye: Secondary | ICD-10-CM | POA: Diagnosis not present

## 2012-03-15 DIAGNOSIS — H04129 Dry eye syndrome of unspecified lacrimal gland: Secondary | ICD-10-CM | POA: Diagnosis not present

## 2012-03-15 DIAGNOSIS — H18519 Endothelial corneal dystrophy, unspecified eye: Secondary | ICD-10-CM | POA: Diagnosis not present

## 2012-03-30 DIAGNOSIS — R5383 Other fatigue: Secondary | ICD-10-CM | POA: Diagnosis not present

## 2012-03-30 DIAGNOSIS — D509 Iron deficiency anemia, unspecified: Secondary | ICD-10-CM | POA: Diagnosis not present

## 2012-03-30 DIAGNOSIS — I129 Hypertensive chronic kidney disease with stage 1 through stage 4 chronic kidney disease, or unspecified chronic kidney disease: Secondary | ICD-10-CM | POA: Diagnosis not present

## 2012-03-30 DIAGNOSIS — R5381 Other malaise: Secondary | ICD-10-CM | POA: Diagnosis not present

## 2012-03-30 DIAGNOSIS — N183 Chronic kidney disease, stage 3 unspecified: Secondary | ICD-10-CM | POA: Diagnosis not present

## 2012-03-30 DIAGNOSIS — Z79899 Other long term (current) drug therapy: Secondary | ICD-10-CM | POA: Diagnosis not present

## 2012-03-30 DIAGNOSIS — M25569 Pain in unspecified knee: Secondary | ICD-10-CM | POA: Diagnosis not present

## 2012-05-03 DIAGNOSIS — M76899 Other specified enthesopathies of unspecified lower limb, excluding foot: Secondary | ICD-10-CM | POA: Diagnosis not present

## 2012-05-24 DIAGNOSIS — D509 Iron deficiency anemia, unspecified: Secondary | ICD-10-CM | POA: Diagnosis not present

## 2012-07-16 ENCOUNTER — Emergency Department (HOSPITAL_COMMUNITY): Payer: Medicare Other

## 2012-07-16 ENCOUNTER — Encounter (HOSPITAL_COMMUNITY): Payer: Self-pay | Admitting: Emergency Medicine

## 2012-07-16 ENCOUNTER — Emergency Department (HOSPITAL_COMMUNITY)
Admission: EM | Admit: 2012-07-16 | Discharge: 2012-07-16 | Disposition: A | Discharge status: Home / Self Care | Payer: Medicare Other | Attending: Emergency Medicine | Admitting: Emergency Medicine

## 2012-07-16 DIAGNOSIS — M169 Osteoarthritis of hip, unspecified: Secondary | ICD-10-CM | POA: Diagnosis not present

## 2012-07-16 DIAGNOSIS — Z9889 Other specified postprocedural states: Secondary | ICD-10-CM | POA: Diagnosis not present

## 2012-07-16 DIAGNOSIS — M25559 Pain in unspecified hip: Secondary | ICD-10-CM

## 2012-07-16 DIAGNOSIS — R269 Unspecified abnormalities of gait and mobility: Secondary | ICD-10-CM | POA: Insufficient documentation

## 2012-07-16 DIAGNOSIS — Z8739 Personal history of other diseases of the musculoskeletal system and connective tissue: Secondary | ICD-10-CM | POA: Insufficient documentation

## 2012-07-16 DIAGNOSIS — Z79899 Other long term (current) drug therapy: Secondary | ICD-10-CM | POA: Diagnosis not present

## 2012-07-16 DIAGNOSIS — I10 Essential (primary) hypertension: Secondary | ICD-10-CM | POA: Insufficient documentation

## 2012-07-16 HISTORY — DX: Essential (primary) hypertension: I10

## 2012-07-16 MED ORDER — HYDROCODONE-ACETAMINOPHEN 5-325 MG PO TABS
1.0000 | ORAL_TABLET | Freq: Once | ORAL | Status: AC
  Administered 2012-07-16: 1 via ORAL
  Filled 2012-07-16: qty 1

## 2012-07-16 MED ORDER — HYDROCODONE-ACETAMINOPHEN 5-325 MG PO TABS: ORAL_TABLET | ORAL | Status: DC

## 2012-07-16 NOTE — ED Provider Notes (Signed)
History     CSN: [id number]  Arrival date & time 07/16/12  1245   First MD Initiated Contact with Patient 07/16/12 1338      Chief Complaint  Patient presents with  . Hip Pain    (Consider location/radiation/quality/duration/timing/severity/associated sxs/prior treatment) HPI Comments: Patient with history of lower back surgery, osteoarthritis presents with two-day history of worsening right lateral and posterior hip pain. Onset was gradual and was not associated with any injury or fall. Patient describes pain as a "pinched nerve", throbbing. She denies weakness, numbness, or tingling in her leg. Patient has been ambulatory today while at a funeral and she states that the pain became worse. She denies swelling of the extremity. She's taken Tylenol with minimal relief. She denies fever, chills, chest pain, abdominal pain, urinary symptoms. Onset gradual. Course is constant.  Patient is a 78 y.o. female presenting with hip pain. The history is provided by the patient.  Hip Pain Associated symptoms include arthralgias. Pertinent negatives include no fever, joint swelling, neck pain, numbness or weakness.    Past Medical History  Diagnosis Date  . Hypertension     Past Surgical History  Procedure Date  . Back surgery   . Abdominal hysterectomy   . Knee surgery   . Hernia repair     History reviewed. No pertinent family history.  History  Substance Use Topics  . Smoking status: Never Smoker   . Smokeless tobacco: Not on file  . Alcohol Use: No    OB History    Grav Para Term Preterm Abortions TAB SAB Ect Mult Living                  Review of Systems  Constitutional: Negative for fever, activity change and unexpected weight change.  HENT: Negative for neck pain.   Gastrointestinal: Negative for constipation.       Negative for fecal incontinence.   Genitourinary: Negative for dysuria, hematuria, flank pain and pelvic pain.       Negative for urinary incontinence or  retention.  Musculoskeletal: Positive for arthralgias and gait problem. Negative for back pain and joint swelling.  Skin: Negative for wound.  Neurological: Negative for weakness and numbness.       Denies saddle paresthesias.    Allergies  Felodipine and Omeprazole  Home Medications   Current Outpatient Rx  Name  Route  Sig  Dispense  Refill  . ALPRAZOLAM 0.5 MG PO TABS   Oral   Take 0.5 mg by mouth at bedtime as needed. For sleep         . AMLODIPINE BESYLATE 2.5 MG PO TABS   Oral   Take 2.5 mg by mouth every morning.         Marland Kitchen VITAMIN D 1000 UNITS PO TABS   Oral   Take 1,000 Units by mouth every morning.         . ENALAPRIL MALEATE 20 MG PO TABS   Oral   Take 20 mg by mouth daily.         Marland Kitchen FERROUS SULFATE 325 (65 FE) MG PO TABS   Oral   Take 325 mg by mouth daily with breakfast.         . OMEGA-3 FATTY ACIDS 1000 MG PO CAPS   Oral   Take 2 g by mouth daily after lunch.         Marland Kitchen HYDROCHLOROTHIAZIDE 25 MG PO TABS   Oral   Take 25 mg by mouth every  morning.         . METHYLPREDNISOLONE SODIUM SUCC 40 MG IJ SOLR   Intramuscular   Inject 40-80 mg into the muscle once.         . ADULT MULTIVITAMIN W/MINERALS CH   Oral   Take 1 tablet by mouth daily after lunch.         Marland Kitchen HYDROCODONE-ACETAMINOPHEN 5-325 MG PO TABS      Take 0.5-1 tablet every 6 hours as needed for severe pain   10 tablet   0     BP 156/67  Pulse 74  Temp 99.3 F (37.4 C) (Oral)  Resp 18  SpO2 99%  Physical Exam  Nursing note and vitals reviewed. Constitutional: She appears well-developed and well-nourished.  HENT:  Head: Normocephalic and atraumatic.  Eyes: Pupils are equal, round, and reactive to light.  Neck: Normal range of motion. Neck supple.  Cardiovascular: Exam reveals no decreased pulses.   Pulses:      Dorsalis pedis pulses are 2+ on the right side, and 2+ on the left side.       Posterior tibial pulses are 2+ on the right side, and 2+ on the left  side.  Musculoskeletal: She exhibits tenderness. She exhibits no edema.       Right hip: She exhibits tenderness. She exhibits normal range of motion, normal strength and no swelling.       Left hip: Normal.       Right knee: Normal. She exhibits normal range of motion. no tenderness found.       Right ankle: Normal. She exhibits normal range of motion. no tenderness.       Lumbar back: Normal. She exhibits no tenderness and no bony tenderness.       Back:       Legs:      Right foot: Normal. She exhibits normal range of motion and no tenderness.  Neurological: She is alert. No sensory deficit.       Motor, sensation, and vascular distal to the injury is fully intact.   Skin: Skin is warm and dry.  Psychiatric: She has a normal mood and affect.    ED Course  Procedures (including critical care time)  Labs Reviewed - No data to display Dg Hip Complete Right  07/16/2012  *RADIOLOGY REPORT*  Clinical Data: Right hip pain without injury  RIGHT HIP - COMPLETE 2+ VIEW  Comparison: None.  Findings: Negative for fracture.  Mild degenerative change in the right hip joint.  Negative for AVN.  No mass lesion.  Prior inguinal hernia repair bilaterally.  IMPRESSION: Mild osteoarthritis in the right hip.  No acute abnormality.   Original Report Authenticated By: Janeece Riggers, M.D.      1. Hip pain     1:59 PM Patient seen and examined. X-ray reviewed. D/w Dr. Rosalia Hammers who will see.   Vital signs reviewed and are as follows: Filed Vitals:   07/16/12 1256  BP: 156/67  Pulse: 74  Temp: 99.3 F (37.4 C)  Resp: 18   Dr. Rosalia Hammers saw patient. Agree no need for further imaging. Will treat symptomatically. Patient has PCP and ortho follow-up.   Discussed at length with patient and son that prescribed pain medication can cause drowsiness and can increase fall risk. This medication is to be used at the lowest dose and under supervision. He verbalized understanding.  Patient urged to return with worsening  symptoms or other concerns. Patient verbalized understanding and agrees with plan.  MDM  Patients with gradual onset of right posterior hip pain without injury. Patient has a history of lower back problems. This is likely musculoskeletal or radicular in nature. Patient is ambulatory. No indication for CT of hip. There is no concern for DVT in the pelvis or of cellulitis given exam. Patient has PCP and orthopedic followup.        Renne Crigler, [place] 07/16/12 1755

## 2012-07-16 NOTE — ED Notes (Signed)
Pt c/o Rt hip pain since Friday.  Denies injury.  States "It feels like a pinched nerve"

## 2012-07-17 NOTE — ED Provider Notes (Signed)
History/physical exam/procedure(s) were performed by non-physician practitioner and as supervising physician I was immediately available for consultation/collaboration. I have reviewed all notes and am in agreement with care and plan.   Hilario Quarry, MD 07/17/12 [phone]

## 2012-07-19 DIAGNOSIS — M25559 Pain in unspecified hip: Secondary | ICD-10-CM | POA: Diagnosis not present

## 2012-08-18 DIAGNOSIS — M76899 Other specified enthesopathies of unspecified lower limb, excluding foot: Secondary | ICD-10-CM | POA: Diagnosis not present

## 2012-09-22 DIAGNOSIS — K219 Gastro-esophageal reflux disease without esophagitis: Secondary | ICD-10-CM | POA: Diagnosis not present

## 2012-09-22 DIAGNOSIS — R079 Chest pain, unspecified: Secondary | ICD-10-CM | POA: Diagnosis not present

## 2012-09-26 DIAGNOSIS — I2510 Atherosclerotic heart disease of native coronary artery without angina pectoris: Secondary | ICD-10-CM | POA: Diagnosis not present

## 2012-09-26 DIAGNOSIS — R079 Chest pain, unspecified: Secondary | ICD-10-CM | POA: Diagnosis not present

## 2012-09-26 DIAGNOSIS — R0602 Shortness of breath: Secondary | ICD-10-CM | POA: Diagnosis not present

## 2012-09-26 DIAGNOSIS — I10 Essential (primary) hypertension: Secondary | ICD-10-CM | POA: Diagnosis not present

## 2012-10-06 DIAGNOSIS — I10 Essential (primary) hypertension: Secondary | ICD-10-CM | POA: Diagnosis not present

## 2012-10-06 DIAGNOSIS — I2510 Atherosclerotic heart disease of native coronary artery without angina pectoris: Secondary | ICD-10-CM | POA: Diagnosis not present

## 2012-10-06 DIAGNOSIS — R0602 Shortness of breath: Secondary | ICD-10-CM | POA: Diagnosis not present

## 2012-10-06 DIAGNOSIS — R079 Chest pain, unspecified: Secondary | ICD-10-CM | POA: Diagnosis not present

## 2012-10-11 DIAGNOSIS — I2510 Atherosclerotic heart disease of native coronary artery without angina pectoris: Secondary | ICD-10-CM | POA: Diagnosis not present

## 2012-10-11 DIAGNOSIS — I10 Essential (primary) hypertension: Secondary | ICD-10-CM | POA: Diagnosis not present

## 2012-10-11 DIAGNOSIS — E785 Hyperlipidemia, unspecified: Secondary | ICD-10-CM | POA: Diagnosis not present

## 2012-10-11 DIAGNOSIS — R9439 Abnormal result of other cardiovascular function study: Secondary | ICD-10-CM | POA: Diagnosis not present

## 2012-10-11 DIAGNOSIS — I209 Angina pectoris, unspecified: Secondary | ICD-10-CM | POA: Diagnosis not present

## 2012-10-19 DIAGNOSIS — N183 Chronic kidney disease, stage 3 unspecified: Secondary | ICD-10-CM | POA: Diagnosis not present

## 2012-10-19 DIAGNOSIS — R0602 Shortness of breath: Secondary | ICD-10-CM | POA: Diagnosis not present

## 2012-10-19 DIAGNOSIS — Z0000 Encounter for general adult medical examination without abnormal findings: Secondary | ICD-10-CM | POA: Diagnosis not present

## 2012-10-19 DIAGNOSIS — Z79899 Other long term (current) drug therapy: Secondary | ICD-10-CM | POA: Diagnosis not present

## 2012-10-19 DIAGNOSIS — I129 Hypertensive chronic kidney disease with stage 1 through stage 4 chronic kidney disease, or unspecified chronic kidney disease: Secondary | ICD-10-CM | POA: Diagnosis not present

## 2012-10-19 DIAGNOSIS — R609 Edema, unspecified: Secondary | ICD-10-CM | POA: Diagnosis not present

## 2012-10-19 DIAGNOSIS — Z1331 Encounter for screening for depression: Secondary | ICD-10-CM | POA: Diagnosis not present

## 2012-10-19 DIAGNOSIS — R5381 Other malaise: Secondary | ICD-10-CM | POA: Diagnosis not present

## 2012-11-02 DIAGNOSIS — J209 Acute bronchitis, unspecified: Secondary | ICD-10-CM | POA: Diagnosis not present

## 2012-11-02 DIAGNOSIS — I129 Hypertensive chronic kidney disease with stage 1 through stage 4 chronic kidney disease, or unspecified chronic kidney disease: Secondary | ICD-10-CM | POA: Diagnosis not present

## 2012-11-14 DIAGNOSIS — I209 Angina pectoris, unspecified: Secondary | ICD-10-CM | POA: Diagnosis not present

## 2012-11-14 DIAGNOSIS — I10 Essential (primary) hypertension: Secondary | ICD-10-CM | POA: Diagnosis not present

## 2012-11-14 DIAGNOSIS — R9439 Abnormal result of other cardiovascular function study: Secondary | ICD-10-CM | POA: Diagnosis not present

## 2013-01-10 DIAGNOSIS — H40019 Open angle with borderline findings, low risk, unspecified eye: Secondary | ICD-10-CM | POA: Diagnosis not present

## 2013-01-10 DIAGNOSIS — H04129 Dry eye syndrome of unspecified lacrimal gland: Secondary | ICD-10-CM | POA: Diagnosis not present

## 2013-03-19 DIAGNOSIS — I209 Angina pectoris, unspecified: Secondary | ICD-10-CM | POA: Diagnosis not present

## 2013-03-19 DIAGNOSIS — I10 Essential (primary) hypertension: Secondary | ICD-10-CM | POA: Diagnosis not present

## 2013-03-19 DIAGNOSIS — E785 Hyperlipidemia, unspecified: Secondary | ICD-10-CM | POA: Diagnosis not present

## 2013-03-19 DIAGNOSIS — R9439 Abnormal result of other cardiovascular function study: Secondary | ICD-10-CM | POA: Diagnosis not present

## 2013-03-21 DIAGNOSIS — H18519 Endothelial corneal dystrophy, unspecified eye: Secondary | ICD-10-CM | POA: Diagnosis not present

## 2013-03-21 DIAGNOSIS — H26499 Other secondary cataract, unspecified eye: Secondary | ICD-10-CM | POA: Diagnosis not present

## 2013-03-21 DIAGNOSIS — H35039 Hypertensive retinopathy, unspecified eye: Secondary | ICD-10-CM | POA: Diagnosis not present

## 2013-03-21 DIAGNOSIS — Z961 Presence of intraocular lens: Secondary | ICD-10-CM | POA: Diagnosis not present

## 2013-03-21 DIAGNOSIS — H40019 Open angle with borderline findings, low risk, unspecified eye: Secondary | ICD-10-CM | POA: Diagnosis not present

## 2013-03-22 DIAGNOSIS — M76899 Other specified enthesopathies of unspecified lower limb, excluding foot: Secondary | ICD-10-CM | POA: Diagnosis not present

## 2013-04-19 DIAGNOSIS — N183 Chronic kidney disease, stage 3 unspecified: Secondary | ICD-10-CM | POA: Diagnosis not present

## 2013-04-19 DIAGNOSIS — L821 Other seborrheic keratosis: Secondary | ICD-10-CM | POA: Diagnosis not present

## 2013-04-19 DIAGNOSIS — I129 Hypertensive chronic kidney disease with stage 1 through stage 4 chronic kidney disease, or unspecified chronic kidney disease: Secondary | ICD-10-CM | POA: Diagnosis not present

## 2013-04-23 DIAGNOSIS — H26499 Other secondary cataract, unspecified eye: Secondary | ICD-10-CM | POA: Diagnosis not present

## 2013-05-23 DIAGNOSIS — Z1231 Encounter for screening mammogram for malignant neoplasm of breast: Secondary | ICD-10-CM | POA: Diagnosis not present

## 2013-06-16 ENCOUNTER — Inpatient Hospital Stay (HOSPITAL_COMMUNITY)
Admission: EM | Admit: 2013-06-16 | Discharge: 2013-06-21 | DRG: 418 | Disposition: A | Discharge status: Skilled Nursing Facility | Payer: Medicare Other | Attending: Surgery | Admitting: Surgery

## 2013-06-16 ENCOUNTER — Encounter (HOSPITAL_COMMUNITY): Payer: Self-pay | Admitting: Emergency Medicine

## 2013-06-16 ENCOUNTER — Emergency Department (HOSPITAL_COMMUNITY): Payer: Medicare Other

## 2013-06-16 DIAGNOSIS — K810 Acute cholecystitis: Secondary | ICD-10-CM

## 2013-06-16 DIAGNOSIS — Z01810 Encounter for preprocedural cardiovascular examination: Secondary | ICD-10-CM

## 2013-06-16 DIAGNOSIS — E785 Hyperlipidemia, unspecified: Secondary | ICD-10-CM | POA: Diagnosis present

## 2013-06-16 DIAGNOSIS — K8066 Calculus of gallbladder and bile duct with acute and chronic cholecystitis without obstruction: Secondary | ICD-10-CM | POA: Diagnosis not present

## 2013-06-16 DIAGNOSIS — M109 Gout, unspecified: Secondary | ICD-10-CM | POA: Diagnosis not present

## 2013-06-16 DIAGNOSIS — R1011 Right upper quadrant pain: Secondary | ICD-10-CM

## 2013-06-16 DIAGNOSIS — K8000 Calculus of gallbladder with acute cholecystitis without obstruction: Secondary | ICD-10-CM | POA: Diagnosis not present

## 2013-06-16 DIAGNOSIS — K2970 Gastritis, unspecified, without bleeding: Secondary | ICD-10-CM | POA: Diagnosis not present

## 2013-06-16 DIAGNOSIS — R079 Chest pain, unspecified: Secondary | ICD-10-CM | POA: Diagnosis not present

## 2013-06-16 DIAGNOSIS — N390 Urinary tract infection, site not specified: Secondary | ICD-10-CM | POA: Diagnosis not present

## 2013-06-16 DIAGNOSIS — R109 Unspecified abdominal pain: Secondary | ICD-10-CM | POA: Diagnosis not present

## 2013-06-16 DIAGNOSIS — Z5189 Encounter for other specified aftercare: Secondary | ICD-10-CM | POA: Diagnosis not present

## 2013-06-16 DIAGNOSIS — K5900 Constipation, unspecified: Secondary | ICD-10-CM | POA: Diagnosis not present

## 2013-06-16 DIAGNOSIS — I2510 Atherosclerotic heart disease of native coronary artery without angina pectoris: Secondary | ICD-10-CM | POA: Diagnosis present

## 2013-06-16 DIAGNOSIS — K219 Gastro-esophageal reflux disease without esophagitis: Secondary | ICD-10-CM | POA: Diagnosis not present

## 2013-06-16 DIAGNOSIS — I10 Essential (primary) hypertension: Secondary | ICD-10-CM | POA: Diagnosis present

## 2013-06-16 DIAGNOSIS — R110 Nausea: Secondary | ICD-10-CM | POA: Diagnosis not present

## 2013-06-16 DIAGNOSIS — R5381 Other malaise: Secondary | ICD-10-CM | POA: Diagnosis not present

## 2013-06-16 DIAGNOSIS — K819 Cholecystitis, unspecified: Secondary | ICD-10-CM | POA: Diagnosis not present

## 2013-06-16 DIAGNOSIS — R918 Other nonspecific abnormal finding of lung field: Secondary | ICD-10-CM

## 2013-06-16 DIAGNOSIS — N281 Cyst of kidney, acquired: Secondary | ICD-10-CM | POA: Diagnosis not present

## 2013-06-16 DIAGNOSIS — K279 Peptic ulcer, site unspecified, unspecified as acute or chronic, without hemorrhage or perforation: Secondary | ICD-10-CM | POA: Diagnosis not present

## 2013-06-16 DIAGNOSIS — I517 Cardiomegaly: Secondary | ICD-10-CM | POA: Diagnosis not present

## 2013-06-16 HISTORY — DX: Atherosclerotic heart disease of native coronary artery without angina pectoris: I25.10

## 2013-06-16 HISTORY — DX: Hyperlipidemia, unspecified: E78.5

## 2013-06-16 LAB — CBC
HCT: 38.3 % (ref 36.0–46.0)
Hemoglobin: 13.2 g/dL (ref 12.0–15.0)
MCH: 33 pg (ref 26.0–34.0)
MCHC: 34.5 g/dL (ref 30.0–36.0)
MCV: 95.8 fL (ref 78.0–100.0)
Platelets: 249 10*3/uL (ref 150–400)
RBC: 4 MIL/uL (ref 3.87–5.11)
RDW: 15.1 % (ref 11.5–15.5)
WBC: 9.1 10*3/uL (ref 4.0–10.5)

## 2013-06-16 LAB — COMPREHENSIVE METABOLIC PANEL
ALT: 15 U/L (ref 0–35)
AST: 23 U/L (ref 0–37)
Albumin: 3.4 g/dL — ABNORMAL LOW (ref 3.5–5.2)
Alkaline Phosphatase: 58 U/L (ref 39–117)
BUN: 31 mg/dL — ABNORMAL HIGH (ref 6–23)
CO2: 29 mEq/L (ref 19–32)
Calcium: 10 mg/dL (ref 8.4–10.5)
Chloride: 98 mEq/L (ref 96–112)
Creatinine, Ser: 0.92 mg/dL (ref 0.50–1.10)
GFR calc Af Amer: 65 mL/min — ABNORMAL LOW (ref >90)
GFR calc non Af Amer: 56 mL/min — ABNORMAL LOW (ref >90)
Glucose, Bld: 124 mg/dL — ABNORMAL HIGH (ref 70–99)
Potassium: 4.1 mEq/L (ref 3.5–5.1)
Sodium: 137 mEq/L (ref 135–145)
Total Bilirubin: 0.4 mg/dL (ref 0.3–1.2)
Total Protein: 6.5 g/dL (ref 6.0–8.3)

## 2013-06-16 LAB — LIPASE, BLOOD: Lipase: 29 U/L (ref 11–59)

## 2013-06-16 LAB — TROPONIN I: Troponin I: <0.3 ng/mL (ref <0.30)

## 2013-06-16 MED ORDER — MORPHINE SULFATE 4 MG/ML IJ SOLN
4.0000 mg | Freq: Once | INTRAMUSCULAR | Status: AC
  Administered 2013-06-16: 4 mg via INTRAVENOUS
  Filled 2013-06-16: qty 1

## 2013-06-16 MED ORDER — SODIUM CHLORIDE 0.9 % IV BOLUS (SEPSIS)
500.0000 mL | Freq: Once | INTRAVENOUS | Status: AC
  Administered 2013-06-16: 500 mL via INTRAVENOUS

## 2013-06-16 MED ORDER — LEVOFLOXACIN IN D5W 750 MG/150ML IV SOLN
750.0000 mg | Freq: Once | INTRAVENOUS | Status: AC
  Administered 2013-06-16: 750 mg via INTRAVENOUS
  Filled 2013-06-16: qty 150

## 2013-06-16 MED ORDER — ASPIRIN 325 MG PO TABS
325.0000 mg | ORAL_TABLET | Freq: Once | ORAL | Status: DC
  Filled 2013-06-16: qty 1

## 2013-06-16 MED ORDER — ONDANSETRON HCL 4 MG/2ML IJ SOLN
4.0000 mg | Freq: Once | INTRAMUSCULAR | Status: AC
  Administered 2013-06-16: 4 mg via INTRAVENOUS
  Filled 2013-06-16: qty 2

## 2013-06-16 NOTE — ED Notes (Signed)
Pt changed into a gown with a sheet covering her. Pt also hooked up to the blood pressure cuff and the cardiac monitor.

## 2013-06-16 NOTE — ED Provider Notes (Signed)
CSN: [id number]     Arrival date & time 06/16/13  1921 History   First MD Initiated Contact with Patient 06/16/13 1937     Chief Complaint  Patient presents with  . Chest Pain  . Abdominal Pain   (Consider location/radiation/quality/duration/timing/severity/associated sxs/prior Treatment) HPI Pt presents with c/o right upper abdominal pain.  Pt states pain woke her from sleep approx 5am this morning.  Pain has been constant all day.  Has had several episodes of emesis as well.  Non bloody and nonbilious.  No fever/chills.  No difficulty breathing.  Approx 2 hours before coming to the ED she also had some left sided chest pain which she describes as dull pain under her left breast.  This was relieved with nitroglycerin via EMS.  Pt also received ASA.  Pt states she sometimes has pain similar to this but has no hx of MI or stents.  Her primary complaint is the right upper abdominal pain.  She states she has had episodes of right upper abdominal pain in the past but this episode is more severe.  There are no other associated systemic symptoms, there are no other alleviating or modifying factors.   Past Medical History  Diagnosis Date  . Hypertension   . CAD (coronary artery disease)     a. Mod prox LAD & first diagonal stenosis by cath 2002 - managed medically since that time with normal LV function.  . Hyperlipidemia    Past Surgical History  Procedure Laterality Date  . Back surgery    . Abdominal hysterectomy    . Knee surgery    . Hernia repair    . Shoulder surgery    . Carpal tunnel release     Family History  Problem Relation Age of Onset  . Heart disease Mother     "enlarged valve"   History  Substance Use Topics  . Smoking status: Never Smoker   . Smokeless tobacco: Never Used  . Alcohol Use: No   OB History   Grav Para Term Preterm Abortions TAB SAB Ect Mult Living                 Review of Systems ROS reviewed and all otherwise negative except for mentioned in  HPI  Allergies  Felodipine; Omeprazole; and Shrimp  Home Medications   No current outpatient prescriptions on file. BP 142/68  Pulse 76  Temp(Src) 98.6 F (37 C) (Oral)  Resp 18  Ht 5\' 5"  (1.651 m)  Wt 196 lb 10.4 oz (89.2 kg)  BMI 32.72 kg/m2  SpO2 96% Vitals reviewed Physical Exam Physical Examination: General appearance - alert, well appearing, and in no distress Mental status - alert, oriented to person, place, and time Eyes - no scleral icterus, no conjunctival injection Mouth - mucous membranes moist, pharynx normal without lesions Chest - clear to auscultation, no wheezes, rales or rhonchi, symmetric air entry Heart - normal rate, regular rhythm, normal S1, S2, no murmurs, rubs, clicks or gallops Abdomen - soft, right upper quadrant moderate tenderness, no gaurding or rebound, nabs, nondistended, no masses or organomegaly Extremities - peripheral pulses normal, no pedal edema, no clubbing or cyanosis Skin - normal coloration and turgor, no rashes  ED Course  Procedures (including critical care time)  11:28 PM d/w Dr. Corliss Skains, surgery- he plans to admit patient and would like to do cholecystectomy tomorrow.  Requests medicine consult tonight- paging now to discuss.    11:33 PM  Discussed all results and plan with  patient and family at bedside.    11:44 PM d/w Dr. Lovell Sheehan, [facility] hospitalist, requested medical consult tonight in preparation for OR tomorrow.  Labs Review Labs Reviewed  COMPREHENSIVE METABOLIC PANEL - Abnormal; Notable for the following:    Glucose, Bld 124 (*)    BUN 31 (*)    Albumin 3.4 (*)    GFR calc non Af Amer 56 (*)    GFR calc Af Amer 65 (*)    All other components within normal limits  URINALYSIS, ROUTINE W REFLEX MICROSCOPIC - Abnormal; Notable for the following:    APPearance HAZY (*)    Ketones, ur 15 (*)    Leukocytes, UA SMALL (*)    All other components within normal limits  URINE MICROSCOPIC-ADD ON - Abnormal; Notable for the  following:    Squamous Epithelial / LPF FEW (*)    Bacteria, UA FEW (*)    All other components within normal limits  URINE CULTURE  CBC  LIPASE, BLOOD  TROPONIN I  TROPONIN I  TROPONIN I  TROPONIN I   Imaging Review Dg Chest 2 View  06/16/2013   CLINICAL DATA:  Chest pain  EXAM: CHEST  2 VIEW  COMPARISON:  03/24/2009  FINDINGS: The cardiac shadow is stable. A hiatal hernia is again noted. Left basilar infiltrate is seen adjacent to the hiatal hernia. No other focal infiltrate is noted.  IMPRESSION: Left lower lobe infiltrate   Electronically Signed   By: Alcide Clever M.D.   On: 06/16/2013 21:06   US Abdomen Limited  06/16/2013   CLINICAL DATA:  Right chest and abdominal pain, history splenectomy  EXAM: US ABDOMEN LIMITED - RIGHT UPPER QUADRANT  COMPARISON:  06/24/2004  FINDINGS: Gallbladder  Sludge within gallbladder. Additionally, 1.8 cm diameter shadowing calculus within gallbladder. Borderline gallbladder wall thickening. No pericholecystic fluid. Sonographic Murphy sign present.  Common bile duct  Diameter: 3 mm diameter  Liver:  Normal appearance. Hepatopetal portal venous flow.  No right upper quadrant ascites.  Incidentally noted cyst within right kidney 2.5 x 2.5 x 2.6 cm.  IMPRESSION: Sludge and gallstone within gallbladder with borderline gallbladder wall thickening and presence of a sonographic Murphy sign, raising question of acute cholecystitis.  Small right renal cyst.   Electronically Signed   By: Ulyses Southward M.D.   On: 06/16/2013 22:56    EKG Interpretation     Ventricular Rate:    PR Interval:    QRS Duration:   QT Interval:    QTC Calculation:   R Axis:     Text Interpretation:             Date: 06/16/2013  Rate: 65  Rhythm: normal sinus rhythm with PACs  QRS Axis: right  Intervals: normal  ST/T Wave abnormalities: normal  Conduction Disutrbances:none  Narrative Interpretation:   Old EKG Reviewed: since prior tracing axis has shifted leftward, PACs are  new  Note--unable to enter interpretation into muse- hyperlink to open muse did not work   MDM   1. Acute cholecystitis   2. CAD (coronary artery disease)   3. Chest pain   4. Hypertension   5. RUQ abdominal pain   6. Preop cardiovascular exam     Pt presentks with c/o right upper abdominal pain, labs are reassuring, ultrasound shows signs of acute cholecystitis.  Pt also had some left sided chest pain, troponin negative and EKG reassuring.  CXR also was read as left lower lobe infiltrate- pt started on levaquin, however clinically  pt does not fit picture of pneumonia.  Surgery to admit and medicine to consult.      Ethelda Chick, MD 06/17/13 [phone]

## 2013-06-16 NOTE — ED Notes (Signed)
Pt from [facility] with right sided flank and chest pain.  Pt sts this pain started this morning.  Sts it has progressively gotten worse.  Initially pain was 5/10; pain has increased to 8/10 PTA.  Pt reporting some n/v/d.  Pt given 324 ASA and 2 nitros with some pain relief.  Pt hypertensive with EMS.  Sts she has had difficulty with her medications today.  IV access established en route 20 LAC.  Pt alert and oriented x 4.  MAE x4.

## 2013-06-16 NOTE — ED Notes (Signed)
Old and new  EKG given to Dr Karma Ganja

## 2013-06-17 ENCOUNTER — Encounter (HOSPITAL_COMMUNITY): Payer: Self-pay | Admitting: *Deleted

## 2013-06-17 DIAGNOSIS — R918 Other nonspecific abnormal finding of lung field: Secondary | ICD-10-CM | POA: Diagnosis present

## 2013-06-17 DIAGNOSIS — K810 Acute cholecystitis: Secondary | ICD-10-CM | POA: Diagnosis present

## 2013-06-17 DIAGNOSIS — R1011 Right upper quadrant pain: Secondary | ICD-10-CM

## 2013-06-17 DIAGNOSIS — K8066 Calculus of gallbladder and bile duct with acute and chronic cholecystitis without obstruction: Secondary | ICD-10-CM

## 2013-06-17 DIAGNOSIS — I10 Essential (primary) hypertension: Secondary | ICD-10-CM

## 2013-06-17 DIAGNOSIS — Z01810 Encounter for preprocedural cardiovascular examination: Secondary | ICD-10-CM

## 2013-06-17 DIAGNOSIS — I2510 Atherosclerotic heart disease of native coronary artery without angina pectoris: Secondary | ICD-10-CM

## 2013-06-17 DIAGNOSIS — R079 Chest pain, unspecified: Secondary | ICD-10-CM | POA: Diagnosis present

## 2013-06-17 LAB — URINALYSIS, ROUTINE W REFLEX MICROSCOPIC
Bilirubin Urine: NEGATIVE
Glucose, UA: NEGATIVE mg/dL
Hgb urine dipstick: NEGATIVE
Ketones, ur: 15 mg/dL — AB
Nitrite: NEGATIVE
Protein, ur: NEGATIVE mg/dL
Specific Gravity, Urine: 1.021 (ref 1.005–1.030)
Urobilinogen, UA: 0.2 mg/dL (ref 0.0–1.0)
pH: 5.5 (ref 5.0–8.0)

## 2013-06-17 LAB — URINE MICROSCOPIC-ADD ON

## 2013-06-17 LAB — TROPONIN I
Troponin I: <0.3 ng/mL (ref <0.30)
Troponin I: <0.3 ng/mL (ref <0.30)
Troponin I: <0.3 ng/mL (ref <0.30)

## 2013-06-17 MED ORDER — ONDANSETRON HCL 4 MG/2ML IJ SOLN
4.0000 mg | Freq: Four times a day (QID) | INTRAMUSCULAR | Status: DC | PRN
  Administered 2013-06-17 – 2013-06-21 (×5): 4 mg via INTRAVENOUS
  Filled 2013-06-17 (×5): qty 2

## 2013-06-17 MED ORDER — ENALAPRIL MALEATE 20 MG PO TABS
20.0000 mg | ORAL_TABLET | Freq: Every day | ORAL | Status: DC
  Administered 2013-06-17 – 2013-06-18 (×2): 20 mg via ORAL
  Filled 2013-06-17 (×3): qty 1

## 2013-06-17 MED ORDER — HYDROCHLOROTHIAZIDE 25 MG PO TABS
25.0000 mg | ORAL_TABLET | Freq: Every morning | ORAL | Status: DC
  Administered 2013-06-17 – 2013-06-18 (×2): 25 mg via ORAL
  Filled 2013-06-17 (×3): qty 1

## 2013-06-17 MED ORDER — HYDROMORPHONE HCL PF 1 MG/ML IJ SOLN
1.0000 mg | INTRAMUSCULAR | Status: DC | PRN
  Administered 2013-06-17 – 2013-06-20 (×14): 1 mg via INTRAVENOUS
  Filled 2013-06-17 (×14): qty 1

## 2013-06-17 MED ORDER — FENTANYL CITRATE 0.05 MG/ML IJ SOLN
INTRAMUSCULAR | Status: AC
  Filled 2013-06-17: qty 2

## 2013-06-17 MED ORDER — MORPHINE SULFATE 2 MG/ML IJ SOLN: 2.0000 mg | INTRAMUSCULAR | Status: DC | PRN

## 2013-06-17 MED ORDER — POTASSIUM CHLORIDE IN NACL 20-0.9 MEQ/L-% IV SOLN
INTRAVENOUS | Status: DC
  Administered 2013-06-17 – 2013-06-18 (×3): via INTRAVENOUS
  Filled 2013-06-17 (×8): qty 1000

## 2013-06-17 MED ORDER — PANTOPRAZOLE SODIUM 40 MG PO TBEC
40.0000 mg | DELAYED_RELEASE_TABLET | Freq: Every day | ORAL | Status: DC
  Administered 2013-06-17 – 2013-06-21 (×4): 40 mg via ORAL
  Filled 2013-06-17 (×4): qty 1

## 2013-06-17 MED ORDER — AMLODIPINE BESYLATE 2.5 MG PO TABS
2.5000 mg | ORAL_TABLET | Freq: Every morning | ORAL | Status: DC
  Administered 2013-06-17 – 2013-06-21 (×4): 2.5 mg via ORAL
  Filled 2013-06-17 (×5): qty 1

## 2013-06-17 MED ORDER — ISOSORBIDE MONONITRATE ER 60 MG PO TB24
60.0000 mg | ORAL_TABLET | Freq: Every day | ORAL | Status: DC
  Administered 2013-06-17 – 2013-06-21 (×4): 60 mg via ORAL
  Filled 2013-06-17 (×5): qty 1

## 2013-06-17 MED ORDER — PIPERACILLIN-TAZOBACTAM 3.375 G IVPB
3.3750 g | Freq: Three times a day (TID) | INTRAVENOUS | Status: DC
  Administered 2013-06-17 – 2013-06-20 (×10): 3.375 g via INTRAVENOUS
  Filled 2013-06-17 (×14): qty 50

## 2013-06-17 MED ORDER — MORPHINE SULFATE 2 MG/ML IJ SOLN
INTRAMUSCULAR | Status: AC
  Administered 2013-06-17: 2 mg via INTRAVENOUS
  Filled 2013-06-17: qty 1

## 2013-06-17 MED ORDER — FENTANYL CITRATE 0.05 MG/ML IJ SOLN
50.0000 ug | INTRAMUSCULAR | Status: DC | PRN
  Filled 2013-06-17: qty 2

## 2013-06-17 MED ORDER — ONDANSETRON HCL 4 MG/2ML IJ SOLN
INTRAMUSCULAR | Status: AC
  Administered 2013-06-17: 4 mg
  Filled 2013-06-17: qty 2

## 2013-06-17 NOTE — Consult Note (Signed)
[facility] Medical Consultation  [name] WGN:[id number] DOB: December 07, 1928 DOA: 06/16/2013     PCP: No primary provider on file.  Requesting physician:  Dr. Lorre Munroe Date of consultation: 06/17/2013 Reason for consultation: Pre-op Clearance and Medical Management      Impression/Recommendations Principal Problem:   Acute cholecystitis Active Problems:   RUQ abdominal pain   Chest pain   CAD (coronary artery disease)   Hypertension    1.   Acute Cholecystitis-  General Surgery preparing for possible surgery this AM.  NPO and on IVFs, and IV Fentanyl for Pain.    2.   RUQ Pain due to #1.    3.   Chest Pain-  Hx CAD, cycle Troponins, Negative so far and No Acute Changes on EKG.    Cardiology Consulted for clearance.   4.   CAD hx-  Sees Cardiology Dr. Verdis Prime, and is on Imdur Rx.     5.   HTN-   Home medications of Enalapril, Amlodipine on hold while NPO,  IV hydralazine for SBP > 160.        The [facility] Rounding Team will followup again tomorrow.  Thank you for this consultation.     Chief Complaint: RUQ ABD Pain    HPI:  79 yo female from the [facility] who was sent to the ED due to RUQ ABD Pain  Since the AM.   She has had several months of post-prandial "indigestion" and 16 hours of severe RUQ/ epigastric abdominal pain, nausea, vomiting. This has been her worst attack. The pain became  progressively worse, when the pain started it was a 5/10;  It continued to progress to an 8/10.     She had poor intake of foods and liquids because of her pain and symptoms.  She also reported having chest pain in the ED,  And she was given 324 ASA and 2 nitros with some pain relief.    In the ED she was evaluated by the EDP and an initial troponin was performed and found to be negative, and a LLL infiltrate was seen on Chest X-ray.  And ABD [place] study  Was performed because of the RUQ ABD Pain and revealed a 1.8 cm Gallstone and  Gallbladder wall thickening and Gall bladder Sludge.   General Surgery was called and patient was admitted to the General Surgery Service and Medicaine was consulted.   She was placed on IV levaquin for the LLL Infiltrate.      Review of Systems: The patient denies  fever, chills, headaches, weight loss, vision loss, diplopia, dizziness, decreased hearing, rhinitis, hoarseness, syncope, dyspnea on exertion, peripheral edema, balance deficits, cough, hemoptysis, diarrhea, constipation, hematemesis, melena, hematochezia, severe indigestion/heartburn, dysuria, hematuria, incontinence, muscle weakness, suspicious skin lesions, transient blindness, difficulty walking, depression, unusual weight change, abnormal bleeding, enlarged lymph nodes, angioedema, and breast masses.    Past Medical History  Diagnosis Date  . Hypertension    Past Surgical History  Procedure Laterality Date  . Back surgery    . Abdominal hysterectomy    . Knee surgery    . Hernia repair     Social History:  reports that she has never smoked. She has never used smokeless tobacco. She reports that she does not drink alcohol or use illicit drugs.  Allergies  Allergen Reactions  . Felodipine     Reaction: unknown possible nausea or rash  . Omeprazole     Reaction: unknown possible nausea  or rash  . Shrimp [Shellfish Allergy] Nausea And Vomiting   History reviewed. No pertinent family history.  Prior to Admission medications   Medication Sig Start Date End Date Taking? Authorizing Provider  ALPRAZolam Prudy Feeler) 0.5 MG tablet Take 0.5 mg by mouth at bedtime as needed. For sleep   Yes Historical Provider, MD  amLODipine (NORVASC) 2.5 MG tablet Take 2.5 mg by mouth every morning.   Yes Historical Provider, MD  aspirin EC 325 MG tablet Take 325 mg by mouth daily.   Yes Historical Provider, MD  cholecalciferol (VITAMIN D) 1000 UNITS tablet Take 1,000 Units by mouth every morning.   Yes Historical Provider, MD  enalapril  (VASOTEC) 20 MG tablet Take 20 mg by mouth daily.   Yes Historical Provider, MD  ferrous sulfate 325 (65 FE) MG tablet Take 325 mg by mouth daily with breakfast.   Yes Historical Provider, MD  fish oil-omega-3 fatty acids 1000 MG capsule Take 2 g by mouth daily after lunch.   Yes Historical Provider, MD  hydrochlorothiazide (HYDRODIURIL) 25 MG tablet Take 25 mg by mouth every morning.   Yes Historical Provider, MD  isosorbide mononitrate (IMDUR) 60 MG 24 hr tablet Take 60 mg by mouth daily.   Yes Historical Provider, MD  Multiple Vitamin (MULTIVITAMIN WITH MINERALS) TABS Take 1 tablet by mouth daily after lunch.   Yes Historical Provider, MD  pantoprazole (PROTONIX) 40 MG tablet Take 40 mg by mouth daily.   Yes Historical Provider, MD  Polyethyl Glycol-Propyl Glycol (SYSTANE OP) Place 1 drop into both eyes 2 (two) times daily.   Yes Historical Provider, MD      Physical Exam:   GEN:  Pleasant  Elderly Obese 79 y.o.  Caucasian female  examined  and in no acute distress; cooperative with exam Blood pressure 135/71, pulse 75, temperature 98.4 F (36.9 C), temperature source Oral, resp. rate 16, height 5\' 5"  (1.651 m), weight 89.2 kg (196 lb 10.4 oz), SpO2 97.00%. Filed Vitals:   06/17/13 0413  BP: 135/71  Pulse: 75  Temp: 98.4 F (36.9 C)  Resp: 16   PSYCH: She is alert and oriented x4; does not appear anxious does not appear depressed; affect is normal HEENT: Normocephalic and Atraumatic, Mucous membranes pink; PERRLA; EOM intact; Fundi:  Benign;  No scleral icterus, Nares: Patent, Oropharynx: Clear, Fair Dentition, Neck:  FROM, no cervical lymphadenopathy nor thyromegaly or carotid bruit; no JVD; Breasts:: Not examined CHEST WALL: No tenderness CHEST: Normal respiration, clear to auscultation bilaterally HEART: Regular rate and rhythm; no murmurs rubs or gallops BACK: No kyphosis or scoliosis; no CVA tenderness ABDOMEN: Positive Bowel Sounds, Obese, soft  Tender in the RUQ, + Murphy's  Sign,  No Rebound No Guarding,   No masses, no organomegaly, no pannus; no intertriginous candida. Rectal Exam: Not done EXTREMITIES: No cyanosis, clubbing or edema; no ulcerations. Genitalia: not examined PULSES: 2+ and symmetric SKIN: Normal hydration no rash or ulceration CNS: Cranial nerves 2-12 grossly intact no focal neurologic deficit    Labs on Admission:  Basic Metabolic Panel:  Recent Labs Lab 06/16/13 1958  NA 137  K 4.1  CL 98  CO2 29  GLUCOSE 124*  BUN 31*  CREATININE 0.92  CALCIUM 10.0   Liver Function Tests:  Recent Labs Lab 06/16/13 1958  AST 23  ALT 15  ALKPHOS 58  BILITOT 0.4  PROT 6.5  ALBUMIN 3.4*    Recent Labs Lab 06/16/13 1958  LIPASE 29   No results found  for this basename: AMMONIA,  in the last 168 hours CBC:  Recent Labs Lab 06/16/13 1958  WBC 9.1  HGB 13.2  HCT 38.3  MCV 95.8  PLT 249   Cardiac Enzymes:  Recent Labs Lab 06/16/13 1958  TROPONINI <0.30   BNP: No components found with this basename: POCBNP,  CBG: No results found for this basename: GLUCAP,  in the last 168 hours  Radiological Exams on Admission: Dg Chest 2 View  06/16/2013   CLINICAL DATA:  Chest pain  EXAM: CHEST  2 VIEW  COMPARISON:  03/24/2009  FINDINGS: The cardiac shadow is stable. A hiatal hernia is again noted. Left basilar infiltrate is seen adjacent to the hiatal hernia. No other focal infiltrate is noted.  IMPRESSION: Left lower lobe infiltrate   Electronically Signed   By: Alcide Clever M.D.   On: 06/16/2013 21:06   US Abdomen Limited  06/16/2013   CLINICAL DATA:  Right chest and abdominal pain, history splenectomy  EXAM: US ABDOMEN LIMITED - RIGHT UPPER QUADRANT  COMPARISON:  06/24/2004  FINDINGS: Gallbladder  Sludge within gallbladder. Additionally, 1.8 cm diameter shadowing calculus within gallbladder. Borderline gallbladder wall thickening. No pericholecystic fluid. Sonographic Murphy sign present.  Common bile duct  Diameter: 3 mm diameter   Liver:  Normal appearance. Hepatopetal portal venous flow.  No right upper quadrant ascites.  Incidentally noted cyst within right kidney 2.5 x 2.5 x 2.6 cm.  IMPRESSION: Sludge and gallstone within gallbladder with borderline gallbladder wall thickening and presence of a sonographic Murphy sign, raising question of acute cholecystitis.  Small right renal cyst.   Electronically Signed   By: Ulyses Southward M.D.   On: 06/16/2013 22:56    EKG: Independently reviewed.  Normal Sinus rhythm without Acute S-T changes.      Time spent:  60 Minutes  JENKINS,HARVETTE C [facility] Pager [phone]  If 7PM-7AM, please contact night-coverage [email or web address] Password [facility] 06/17/2013, 5:35 AM

## 2013-06-17 NOTE — Progress Notes (Signed)
[facility] Progress Note    Subjective: Patient still tender in the epigastrium and the RUQ, but in the process of being evaluated by cardiology.  No urgency in surgery at this time.  Objective: Vital signs in last 24 hours: Temp:  [98.3 F (36.8 C)-98.6 F (37 C)] 98.4 F (36.9 C) (11/02 0413) Pulse Rate:  [60-76] 75 (11/02 0413) Resp:  [14-16] 16 (11/02 0413) BP: (135-163)/(49-73) 135/71 mmHg (11/02 0413) SpO2:  [90 %-100 %] 97 % (11/02 0413) Weight:  [89.2 kg (196 lb 10.4 oz)] 89.2 kg (196 lb 10.4 oz) (11/02 0100) Last BM Date: 06/15/13  Intake/Output from previous day:   Intake/Output this shift:    General: No acute distress  Lungs: Clear to auscultation  Abd: + RUQ tenderness with Murphy's sign  Extremities: Mild edema  Neuro: Intact  Lab Results:  @LABLAST2 (wbc:2,hgb:2,hct:2,plt:2) BMET  Recent Labs  06/16/13 1958  NA 137  K 4.1  CL 98  CO2 29  GLUCOSE 124*  BUN 31*  CREATININE 0.92  CALCIUM 10.0   PT/INR No results found for this basename: LABPROT, INR,  in the last 72 hours ABG No results found for this basename: PHART, PCO2, PO2, HCO3,  in the last 72 hours  Studies/Results: Dg Chest 2 View  06/16/2013   CLINICAL DATA:  Chest pain  EXAM: CHEST  2 VIEW  COMPARISON:  03/24/2009  FINDINGS: The cardiac shadow is stable. A hiatal hernia is again noted. Left basilar infiltrate is seen adjacent to the hiatal hernia. No other focal infiltrate is noted.  IMPRESSION: Left lower lobe infiltrate   Electronically Signed   By: Alcide Clever M.D.   On: 06/16/2013 21:06   US Abdomen Limited  06/16/2013   CLINICAL DATA:  Right chest and abdominal pain, history splenectomy  EXAM: US ABDOMEN LIMITED - RIGHT UPPER QUADRANT  COMPARISON:  06/24/2004  FINDINGS: Gallbladder  Sludge within gallbladder. Additionally, 1.8 cm diameter shadowing calculus within gallbladder. Borderline gallbladder wall thickening. No pericholecystic fluid. Sonographic Murphy sign present.  Common  bile duct  Diameter: 3 mm diameter  Liver:  Normal appearance. Hepatopetal portal venous flow.  No right upper quadrant ascites.  Incidentally noted cyst within right kidney 2.5 x 2.5 x 2.6 cm.  IMPRESSION: Sludge and gallstone within gallbladder with borderline gallbladder wall thickening and presence of a sonographic Murphy sign, raising question of acute cholecystitis.  Small right renal cyst.   Electronically Signed   By: Ulyses Southward M.D.   On: 06/16/2013 22:56    Anti-infectives: Anti-infectives   Start     Dose/Rate Route Frequency Ordered Stop   06/17/13 0230  piperacillin-tazobactam (ZOSYN) IVPB 3.375 g     3.375 g 12.5 mL/hr over 240 Minutes Intravenous Every 8 hours 06/17/13 0203     06/16/13 2200  levofloxacin (LEVAQUIN) IVPB 750 mg     750 mg 100 mL/hr over 90 Minutes Intravenous  Once 06/16/13 2146 06/17/13 0032      Assessment/Plan: s/p  Keep NPO for further cardiac evaluation and possible surgery. Seems unlikely to be able to have surgery today.  May need a stress test to further evaluate cardiac status.  May need Cath. Will await further information from cardiology  She has not had any significant cardiac evaluation for 12 years.  LOS: 1 day   Marta Lamas. Gae Bon, MD, FACS [phone] [facility] 06/17/2013

## 2013-06-17 NOTE — H&P (Signed)
[name] is an 79 y.o. female.   Chief Complaint: RUQ abdominal pain/ nausea/ vomiting HPI: 79 yo female presents with several months of post-prandial "indigestion" and 16 hours of severe RUQ/ epigastric abdominal pain, nausea, vomiting.  This has been her worst attack.  The pain is becoming more severe.  She has had poor PO intake today.  The patient reports frequent urinary incontinence.   Past Medical History  Diagnosis Date  . Hypertension   Coronary artery disease - followed by Dr. Verdis Prime  Past Surgical History  Procedure Laterality Date  . Back surgery    . Abdominal hysterectomy    . Knee surgery    . Hernia repair    Incarcerated incisional hernia repair 2008 Carpal tunnel surgery Splenectomy - ?ITP  History reviewed. No pertinent family history. Social History:  reports that she has never smoked. She does not have any smokeless tobacco history on file. She reports that she does not drink alcohol or use illicit drugs.  Allergies:  Allergies  Allergen Reactions  . Felodipine     Reaction: unknown possible nausea or rash  . Omeprazole     Reaction: unknown possible nausea or rash  . Shrimp [Shellfish Allergy] Nausea And Vomiting    Prior to Admission medications   Medication Sig Start Date End Date Taking? Authorizing Provider  ALPRAZolam Prudy Feeler) 0.5 MG tablet Take 0.5 mg by mouth at bedtime as needed. For sleep   Yes Historical Provider, MD  amLODipine (NORVASC) 2.5 MG tablet Take 2.5 mg by mouth every morning.   Yes Historical Provider, MD  aspirin EC 325 MG tablet Take 325 mg by mouth daily.   Yes Historical Provider, MD  cholecalciferol (VITAMIN D) 1000 UNITS tablet Take 1,000 Units by mouth every morning.   Yes Historical Provider, MD  enalapril (VASOTEC) 20 MG tablet Take 20 mg by mouth daily.   Yes Historical Provider, MD  ferrous sulfate 325 (65 FE) MG tablet Take 325 mg by mouth daily with breakfast.   Yes Historical Provider, MD  fish oil-omega-3  fatty acids 1000 MG capsule Take 2 g by mouth daily after lunch.   Yes Historical Provider, MD  hydrochlorothiazide (HYDRODIURIL) 25 MG tablet Take 25 mg by mouth every morning.   Yes Historical Provider, MD  isosorbide mononitrate (IMDUR) 60 MG 24 hr tablet Take 60 mg by mouth daily.   Yes Historical Provider, MD  Multiple Vitamin (MULTIVITAMIN WITH MINERALS) TABS Take 1 tablet by mouth daily after lunch.   Yes Historical Provider, MD  pantoprazole (PROTONIX) 40 MG tablet Take 40 mg by mouth daily.   Yes Historical Provider, MD  Polyethyl Glycol-Propyl Glycol (SYSTANE OP) Place 1 drop into both eyes 2 (two) times daily.   Yes Historical Provider, MD    Results for orders placed during the hospital encounter of 06/16/13 (from the past 48 hour(s))  CBC     Status: None   Collection Time    06/16/13  7:58 PM      Result Value Range   WBC 9.1  4.0 - 10.5 K/uL   RBC 4.00  3.87 - 5.11 MIL/uL   Hemoglobin 13.2  12.0 - 15.0 g/dL   HCT 98.1  19.1 - 47.8 %   MCV 95.8  78.0 - 100.0 fL   MCH 33.0  26.0 - 34.0 pg   MCHC 34.5  30.0 - 36.0 g/dL   RDW 29.5  62.1 - 30.8 %   Platelets 249  150 - 400  K/uL  COMPREHENSIVE METABOLIC PANEL     Status: Abnormal   Collection Time    06/16/13  7:58 PM      Result Value Range   Sodium 137  135 - 145 mEq/L   Potassium 4.1  3.5 - 5.1 mEq/L   Chloride 98  96 - 112 mEq/L   CO2 29  19 - 32 mEq/L   Glucose, Bld 124 (*) 70 - 99 mg/dL   BUN 31 (*) 6 - 23 mg/dL   Creatinine, Ser 4.09  0.50 - 1.10 mg/dL   Calcium 81.1  8.4 - 91.4 mg/dL   Total Protein 6.5  6.0 - 8.3 g/dL   Albumin 3.4 (*) 3.5 - 5.2 g/dL   AST 23  0 - 37 U/L   ALT 15  0 - 35 U/L   Alkaline Phosphatase 58  39 - 117 U/L   Total Bilirubin 0.4  0.3 - 1.2 mg/dL   GFR calc non Af Amer 56 (*) >90 mL/min   GFR calc Af Amer 65 (*) >90 mL/min   Comment: (NOTE)     The eGFR has been calculated using the CKD EPI equation.     This calculation has not been validated in all clinical situations.     eGFR's  persistently <90 mL/min signify possible Chronic Kidney     Disease.  LIPASE, BLOOD     Status: None   Collection Time    06/16/13  7:58 PM      Result Value Range   Lipase 29  11 - 59 U/L  TROPONIN I     Status: None   Collection Time    06/16/13  7:58 PM      Result Value Range   Troponin I <0.30  <0.30 ng/mL   Comment:            Due to the release kinetics of cTnI,     a negative result within the first hours     of the onset of symptoms does not rule out     myocardial infarction with certainty.     If myocardial infarction is still suspected,     repeat the test at appropriate intervals.  URINALYSIS, ROUTINE W REFLEX MICROSCOPIC     Status: Abnormal   Collection Time    06/16/13 11:18 PM      Result Value Range   Color, Urine YELLOW  YELLOW   APPearance HAZY (*) CLEAR   Specific Gravity, Urine 1.021  1.005 - 1.030   pH 5.5  5.0 - 8.0   Glucose, UA NEGATIVE  NEGATIVE mg/dL   Hgb urine dipstick NEGATIVE  NEGATIVE   Bilirubin Urine NEGATIVE  NEGATIVE   Ketones, ur 15 (*) NEGATIVE mg/dL   Protein, ur NEGATIVE  NEGATIVE mg/dL   Urobilinogen, UA 0.2  0.0 - 1.0 mg/dL   Nitrite NEGATIVE  NEGATIVE   Leukocytes, UA SMALL (*) NEGATIVE   Dg Chest 2 View  06/16/2013   CLINICAL DATA:  Chest pain  EXAM: CHEST  2 VIEW  COMPARISON:  03/24/2009  FINDINGS: The cardiac shadow is stable. A hiatal hernia is again noted. Left basilar infiltrate is seen adjacent to the hiatal hernia. No other focal infiltrate is noted.  IMPRESSION: Left lower lobe infiltrate   Electronically Signed   By: Alcide Clever M.D.   On: 06/16/2013 21:06   US Abdomen Limited  06/16/2013   CLINICAL DATA:  Right chest and abdominal pain, history splenectomy  EXAM: US ABDOMEN  LIMITED - RIGHT UPPER QUADRANT  COMPARISON:  06/24/2004  FINDINGS: Gallbladder  Sludge within gallbladder. Additionally, 1.8 cm diameter shadowing calculus within gallbladder. Borderline gallbladder wall thickening. No pericholecystic fluid.  Sonographic Murphy sign present.  Common bile duct  Diameter: 3 mm diameter  Liver:  Normal appearance. Hepatopetal portal venous flow.  No right upper quadrant ascites.  Incidentally noted cyst within right kidney 2.5 x 2.5 x 2.6 cm.  IMPRESSION: Sludge and gallstone within gallbladder with borderline gallbladder wall thickening and presence of a sonographic Murphy sign, raising question of acute cholecystitis.  Small right renal cyst.   Electronically Signed   By: Ulyses Southward M.D.   On: 06/16/2013 22:56    Review of Systems  Constitutional: Negative for weight loss.  HENT: Negative for ear discharge, ear pain, hearing loss and tinnitus.   Eyes: Negative.  Negative for blurred vision, double vision, photophobia and pain.  Respiratory: Negative for cough, sputum production and shortness of breath.   Cardiovascular: Negative for chest pain.  Gastrointestinal: Positive for nausea, vomiting and abdominal pain.  Genitourinary: Negative for dysuria, urgency, frequency and flank pain.  Musculoskeletal: Positive for back pain. Negative for falls, joint pain, myalgias and neck pain.  Neurological: Negative.  Negative for dizziness, tingling, sensory change, focal weakness, loss of consciousness and headaches.  Endo/Heme/Allergies: Negative.  Does not bruise/bleed easily.  Psychiatric/Behavioral: Negative for depression, memory loss and substance abuse. The patient is not nervous/anxious.     Blood pressure 144/49, pulse 69, temperature 98.3 F (36.8 C), temperature source Oral, resp. rate 14, SpO2 96.00%. Physical Exam  WDWN in NAD HEENT:  EOMI, sclera anicteric Neck:  No masses, no thyromegaly Lungs:  CTA bilaterally; normal respiratory effort CV:  Regular rate and rhythm; no murmurs Abd:  Obese, healed lower midline incision and left subcostal incision; tender in epigastrium and RUQ Ext:  Well-perfused; no edema Skin:  Warm, dry; no sign of jaundice  Assessment/Plan Acute  cholecystitis Hypertension Mild CAD  We will admit the patient to the hospital for IV antibiotics, bowel rest, and laparoscopic cholecystectomy this admission.  We will ask [facility] to see the patient pre-operatively for medical clearance and to help manage her medical issues during this hospitalization.   TSUEI,MATTHEW K. 06/17/2013, 12:05 AM

## 2013-06-17 NOTE — Consult Note (Signed)
Cardiology Consultation Note  Patient ID: [name], MRN: [id number], DOB/AGE: Jul 13, 1929 79 y.o. Admit date: 06/16/2013   Date of Consult: 06/17/2013 Primary Physician: No primary provider on file. Primary Cardiologist: Mendel Ryder  Chief Complaint: RUQ, nausea, vomiting Reason for Consult:   HPI: [name] is an 79 y/o F with history of CAD and HTN who was admitted yesterday with acute cholecystitis whom we are asked to see for pre-op cardiac clearance. She had a cath in 2002 with moderate to moderately severe coronary artery disease with an eccentric 50-60% proximal LAD and 60% first diagonal lesion. This has been managed medically and the patient does not think she's had any interim stress testing or echocardiogram since that time but states "he checked me out real good in August." I do not currently have access to her outpatient records. She tells me she saw Dr. Katrinka Blazing in August at which time she was placed on Imdur for intermittent angina with good results. She used to have chest pain with exertion every couple months, relieved with rest. After the Imdur, she no longer has this. She does get occasional SOB with exertion however that is relieved with rest. She is not very active and the most strenuous thing she typically does is make the bed. She denies any palpitations, orthopnea, or syncope.  She presented with acute worsening of several months of post-prandial "indgestion", epigastric/RUQ abd pain, nausea and vomiting. Yesterday was her worst episode lasting 16 hours and got progressively worse causing her to have poor PO intake. LFTs were unremkarkable but abd [place] was concerning for sludge and gallstone within gallbladder with borderline gallbladder wall thickening and presence of a sonographic Murphy sign, raising question of acute cholecystitis. She also had a LLL infiltrate on CXR and was placed on Levaquin. She reports a dry cough for several years and no fever. Initial troponin was negative  and ECG was unremarkable.    Past Medical History  Diagnosis Date  . Hypertension   . CAD (coronary artery disease)     a. Mod prox LAD & first diagonal stenosis by cath 2002 - managed medically since that time with normal LV function.  . Hyperlipidemia       Most Recent Cardiac Studies: Cardiac Cath 2002 Cardiac Catheterization  INDICATION FOR STUDY: Exertional dyspnea, chest discomfort and abnormal  Cardiolite.  PROCEDURE PERFORMED:  1. Left heart catheterization.  2. Selective coronary angiography.  3. Left ventriculography.  DESCRIPTION OF PROCEDURE: After informed consent, a 6-French sheath was  inserted into the right femoral artery using the modified Seldinger technique.  A 6-French A2 multipurpose catheter was used for left ventriculography by hand  injection and right coronary angiography. The hemodynamic recordings were  also made with this catheter. A 6-French #4 left Judkins catheter was used  for left coronary angiography. The patient tolerated the procedure without  complications.  RESULTS:  I. HEMODYNAMIC DATA:  A. Aortic pressure: 161/75.  b. Left ventricular pressure: 161/19.  II. LEFT VENTRICULOGRAPHY: Left ventricular cavity is small. Overall LV  function was normal. EF is 70%. No mitral regurgitation was noted.  III. CORONARY ANGIOGRAPHY:  A. Left main coronary: Normal.  B. Left anterior descending coronary artery: The LAD is relatively  small. It reaches the apex, but does not go around the apex. There  is a very eccentric, somewhat complex proximal LAD lesion that  angiographically appeared to be no more than 50% in multiple views.  The diagonal, which arises 3-4 mm beyond this contained  a segmental  50-60% proximal narrowing. Antegrade flow throughout the LAD was  brisk.  C. Circumflex artery: Three obtuse marginal branches arise from a normal  circumflex. The obtuse marginal branches are normal.  D. Right coronary artery: The right coronary artery  was large and free  of any significant obstruction. A large PDA and left ventricular  branch arise from it.  CONCLUSIONS:  1. Normal LV function.  2. Moderate to moderately severe coronary artery disease with an eccentric  50-60% proximal LAD and 60% first diagonal lesion.  PLAN: Medical therapy to include risk factor modification with statins  therapy and aspirin therapy. If symptoms become aggressive and/or refractory  coronary intervention on this particular lesion would be easy and quite  approachable.   Surgical History:  Past Surgical History  Procedure Laterality Date  . Back surgery    . Abdominal hysterectomy    . Knee surgery    . Hernia repair    . Shoulder surgery    . Carpal tunnel release       Home Meds: Prior to Admission medications   Medication Sig Start Date End Date Taking? Authorizing Provider  ALPRAZolam Prudy Feeler) 0.5 MG tablet Take 0.5 mg by mouth at bedtime as needed. For sleep   Yes Historical Provider, MD  amLODipine (NORVASC) 2.5 MG tablet Take 2.5 mg by mouth every morning.   Yes Historical Provider, MD  aspirin EC 325 MG tablet Take 325 mg by mouth daily.   Yes Historical Provider, MD  cholecalciferol (VITAMIN D) 1000 UNITS tablet Take 1,000 Units by mouth every morning.   Yes Historical Provider, MD  enalapril (VASOTEC) 20 MG tablet Take 20 mg by mouth daily.   Yes Historical Provider, MD  ferrous sulfate 325 (65 FE) MG tablet Take 325 mg by mouth daily with breakfast.   Yes Historical Provider, MD  fish oil-omega-3 fatty acids 1000 MG capsule Take 2 g by mouth daily after lunch.   Yes Historical Provider, MD  hydrochlorothiazide (HYDRODIURIL) 25 MG tablet Take 25 mg by mouth every morning.   Yes Historical Provider, MD  isosorbide mononitrate (IMDUR) 60 MG 24 hr tablet Take 60 mg by mouth daily.   Yes Historical Provider, MD  Multiple Vitamin (MULTIVITAMIN WITH MINERALS) TABS Take 1 tablet by mouth daily after lunch.   Yes Historical Provider, MD    pantoprazole (PROTONIX) 40 MG tablet Take 40 mg by mouth daily.   Yes Historical Provider, MD  Polyethyl Glycol-Propyl Glycol (SYSTANE OP) Place 1 drop into both eyes 2 (two) times daily.   Yes Historical Provider, MD    Inpatient Medications:  . amLODipine  2.5 mg Oral q morning - 10a  . enalapril  20 mg Oral Daily  . hydrochlorothiazide  25 mg Oral q morning - 10a  . isosorbide mononitrate  60 mg Oral Daily  . pantoprazole  40 mg Oral Daily  . piperacillin-tazobactam (ZOSYN)  IV  3.375 g Intravenous Q8H   . 0.9 % NaCl with KCl 20 mEq / L 100 mL/hr at 06/17/13 0222    Allergies:  Allergies  Allergen Reactions  . Felodipine     Reaction: unknown possible nausea or rash  . Omeprazole     Reaction: unknown possible nausea or rash  . Shrimp [Shellfish Allergy] Nausea And Vomiting    History   Social History  . Marital Status: Widowed    Spouse Name: N/A    Number of Children: N/A  . Years of Education: N/A  Occupational History  . Not on file.   Social History Main Topics  . Smoking status: Never Smoker   . Smokeless tobacco: Never Used  . Alcohol Use: No  . Drug Use: No  . Sexual Activity: Not on file   Other Topics Concern  . Not on file   Social History Narrative  . No narrative on file     Family History  Problem Relation Age of Onset  . Heart disease Mother     "enlarged valve"   Review of Systems: General: negative for chills, fever  Cardiovascular: see above. No pnd. Occasional LEE if she's been on her feet during the day. Dermatological: negative for rash Respiratory: negative for cough or wheezing Urologic: negative for hematuria Abdominal: negative for bright red blood per rectum, melena, or hematemesis Neurologic: negative for syncope or dizziness All other systems reviewed and are otherwise negative except as noted above.  Labs:  Recent Labs  06/16/13 1958  TROPONINI <0.30   Lab Results  Component Value Date   WBC 9.1 06/16/2013    HGB 13.2 06/16/2013   HCT 38.3 06/16/2013   MCV 95.8 06/16/2013   PLT 249 06/16/2013     Recent Labs Lab 06/16/13 1958  NA 137  K 4.1  CL 98  CO2 29  BUN 31*  CREATININE 0.92  CALCIUM 10.0  PROT 6.5  BILITOT 0.4  ALKPHOS 58  ALT 15  AST 23  GLUCOSE 124*   Radiology/Studies:  Dg Chest 2 View  06/16/2013   CLINICAL DATA:  Chest pain  EXAM: CHEST  2 VIEW  COMPARISON:  03/24/2009  FINDINGS: The cardiac shadow is stable. A hiatal hernia is again noted. Left basilar infiltrate is seen adjacent to the hiatal hernia. No other focal infiltrate is noted.  IMPRESSION: Left lower lobe infiltrate   Electronically Signed   By: Alcide Clever M.D.   On: 06/16/2013 21:06   US Abdomen Limited  06/16/2013   CLINICAL DATA:  Right chest and abdominal pain, history splenectomy  EXAM: US ABDOMEN LIMITED - RIGHT UPPER QUADRANT  COMPARISON:  06/24/2004  FINDINGS: Gallbladder  Sludge within gallbladder. Additionally, 1.8 cm diameter shadowing calculus within gallbladder. Borderline gallbladder wall thickening. No pericholecystic fluid. Sonographic Murphy sign present.  Common bile duct  Diameter: 3 mm diameter  Liver:  Normal appearance. Hepatopetal portal venous flow.  No right upper quadrant ascites.  Incidentally noted cyst within right kidney 2.5 x 2.5 x 2.6 cm.  IMPRESSION: Sludge and gallstone within gallbladder with borderline gallbladder wall thickening and presence of a sonographic Murphy sign, raising question of acute cholecystitis.  Small right renal cyst.   Electronically Signed   By: Ulyses Southward M.D.   On: 06/16/2013 22:56    EKG:NSR 65bpm, PAC, RV, no acute ST-T changes  Physical Exam: Blood pressure 135/71, pulse 75, temperature 98.4 F (36.9 C), temperature source Oral, resp. rate 16, height 5\' 5"  (1.651 m), weight 196 lb 10.4 oz (89.2 kg), SpO2 97.00%. General: Well developed, well nourished WF in no acute distress. Laying flat in bed without labored breathing. Head: Normocephalic,  atraumatic, sclera non-icteric, no xanthomas, nares are without discharge.  Neck: Negative for carotid bruits. JVD not elevated. Lungs: Clear bilaterally to auscultation without wheezes, rales, or rhonchi. Breathing is unlabored. Heart: RRR with S1 S2. No murmurs, rubs, or gallops appreciated. Abdomen: Soft, hypoactive BS. Tenderness in epigastrum and RUQ. Msk:  Strength and tone appear normal for age. Extremities: No clubbing or cyanosis. No edema.  Distal pedal pulses are 2+ and equal bilaterally. Neuro: Alert and oriented X 3. No facial asymmetry. No focal deficit. Moves all extremities spontaneously. Slightly hard of hearing. Psych:  Responds to questions appropriately with a normal affect.   Assessment and Plan:   1. Acute cholecystitis 2. Known CAD by cath 2002 3. HTN 4. LLL infiltrate by CXR  Patient seen and examined by Dr. Myrtis Ser and myself. At this time we plan for 2D echocardiogram to assess for any structural abnormalities. Will check lipid panel in AM. Per our discussion, we have decided to defer to Dr. Katrinka Blazing in rounds tomorrow AM to determine what further cardiac testing is necessary this admission (nuc vs cath) depending on what she may have had done as outpatient. Will also hold off on addition of beta blocker until outpatient records can be obtained. Resume low dose ASA when OK with surgery. Otherwise continue current antihypertensives including Imdur. Will follow with you.  Signed, Ronie Spies PA-C 06/17/2013, 8:25 AM Patient seen and examined. I agree with the assessment and plan as detailed above. See also my additional thoughts below.   I spoke with the patient and her family in the room. There is a history of coronary disease. Her meds have been adjusted within the past year. She's not had any recent chest pain. She has not had any recent testing for ischemia. Her exercise level is low. We will assess her left ventricular function. We will pass the word on to Dr. Katrinka Blazing to  see if he could see her on rounds tomorrow to make further decisions about her workup. He knows this patient well. His input will be helpful to decide if she needs any further testing before elective cholecystectomy or not.  Jerral Bonito, MD  Willa Rough, MD, [facility] 06/17/2013 9:38 AM

## 2013-06-17 NOTE — Progress Notes (Signed)
BP 149/58  Pulse 74  Temp(Src) 98.4 F (36.9 C) (Oral)  Resp 18  Ht 5\' 5"  (1.651 m)  Wt 89.2 kg (196 lb 10.4 oz)  BMI 32.72 kg/m2  SpO2 98% - agree with plan.

## 2013-06-18 DIAGNOSIS — I517 Cardiomegaly: Secondary | ICD-10-CM

## 2013-06-18 DIAGNOSIS — I2510 Atherosclerotic heart disease of native coronary artery without angina pectoris: Secondary | ICD-10-CM

## 2013-06-18 LAB — CBC
HCT: 37.6 % (ref 36.0–46.0)
Hemoglobin: 12.5 g/dL (ref 12.0–15.0)
MCH: 32.1 pg (ref 26.0–34.0)
MCHC: 33.2 g/dL (ref 30.0–36.0)
MCV: 96.4 fL (ref 78.0–100.0)
Platelets: 219 10*3/uL (ref 150–400)
RBC: 3.9 MIL/uL (ref 3.87–5.11)
RDW: 15.2 % (ref 11.5–15.5)
WBC: 12 10*3/uL — ABNORMAL HIGH (ref 4.0–10.5)

## 2013-06-18 LAB — COMPREHENSIVE METABOLIC PANEL
ALT: 12 U/L (ref 0–35)
AST: 27 U/L (ref 0–37)
Albumin: 2.7 g/dL — ABNORMAL LOW (ref 3.5–5.2)
Alkaline Phosphatase: 50 U/L (ref 39–117)
BUN: 27 mg/dL — ABNORMAL HIGH (ref 6–23)
CO2: 26 mEq/L (ref 19–32)
Calcium: 9.1 mg/dL (ref 8.4–10.5)
Chloride: 104 mEq/L (ref 96–112)
Creatinine, Ser: 1.41 mg/dL — ABNORMAL HIGH (ref 0.50–1.10)
GFR calc Af Amer: 39 mL/min — ABNORMAL LOW (ref >90)
GFR calc non Af Amer: 34 mL/min — ABNORMAL LOW (ref >90)
Glucose, Bld: 102 mg/dL — ABNORMAL HIGH (ref 70–99)
Potassium: 3.8 mEq/L (ref 3.5–5.1)
Sodium: 140 mEq/L (ref 135–145)
Total Bilirubin: 0.5 mg/dL (ref 0.3–1.2)
Total Protein: 6 g/dL (ref 6.0–8.3)

## 2013-06-18 LAB — LIPID PANEL
Cholesterol: 199 mg/dL (ref 0–200)
HDL: 67 mg/dL (ref >39)
LDL Cholesterol: 118 mg/dL — ABNORMAL HIGH (ref 0–99)
Total CHOL/HDL Ratio: 3 RATIO
Triglycerides: 70 mg/dL (ref <150)
VLDL: 14 mg/dL (ref 0–40)

## 2013-06-18 MED ORDER — DIPHENHYDRAMINE HCL 50 MG/ML IJ SOLN
6.2500 mg | Freq: Three times a day (TID) | INTRAMUSCULAR | Status: DC | PRN
  Administered 2013-06-18 (×2): 6.5 mg via INTRAVENOUS
  Filled 2013-06-18 (×2): qty 1

## 2013-06-18 NOTE — Progress Notes (Signed)
Patient ID: [name], female   DOB: [birth year], 79 y.o.   MRN: [id number]    Subjective: Pt feels ok.  Had some pain, but better after pain meds.  Nauseous yesterday.  Objective: Vital signs in last 24 hours: Temp:  [97.7 F (36.5 C)-99 F (37.2 C)] 99 F (37.2 C) (11/03 0523) Pulse Rate:  [72-80] 80 (11/03 0523) Resp:  [18-20] 20 (11/03 0523) BP: (130-149)/(46-68) 130/68 mmHg (11/03 0523) SpO2:  [94 %-98 %] 94 % (11/03 0523) Weight:  [196 lb 13.9 oz (89.3 kg)] 196 lb 13.9 oz (89.3 kg) (11/02 2008) Last BM Date: 06/15/13  Intake/Output from previous day: 11/02 0701 - 11/03 0700 In: 0  Out: 726 [Urine:726] Intake/Output this shift:    PE: Heart: regular, occasional extra beat Lungs: CTAB Abd: soft, diffusely tender, but greatest in RUQ, bloated, multiple abdominal scars from prior abdominal operations  Lab Results:   Recent Labs  06/16/13 1958  WBC 9.1  HGB 13.2  HCT 38.3  PLT 249   BMET  Recent Labs  06/16/13 1958  NA 137  K 4.1  CL 98  CO2 29  GLUCOSE 124*  BUN 31*  CREATININE 0.92  CALCIUM 10.0   PT/INR No results found for this basename: LABPROT, INR,  in the last 72 hours CMP     Component Value Date/Time   NA 137 06/16/2013 1958   K 4.1 06/16/2013 1958   CL 98 06/16/2013 1958   CO2 29 06/16/2013 1958   GLUCOSE 124* 06/16/2013 1958   BUN 31* 06/16/2013 1958   CREATININE 0.92 06/16/2013 1958   CALCIUM 10.0 06/16/2013 1958   PROT 6.5 06/16/2013 1958   ALBUMIN 3.4* 06/16/2013 1958   AST 23 06/16/2013 1958   ALT 15 06/16/2013 1958   ALKPHOS 58 06/16/2013 1958   BILITOT 0.4 06/16/2013 1958   GFRNONAA 56* 06/16/2013 1958   GFRAA 65* 06/16/2013 1958   Lipase     Component Value Date/Time   LIPASE 29 06/16/2013 1958       Studies/Results: Dg Chest 2 View  06/16/2013   CLINICAL DATA:  Chest pain  EXAM: CHEST  2 VIEW  COMPARISON:  03/24/2009  FINDINGS: The cardiac shadow is stable. A hiatal hernia is again noted. Left basilar infiltrate is seen  adjacent to the hiatal hernia. No other focal infiltrate is noted.  IMPRESSION: Left lower lobe infiltrate   Electronically Signed   By: Alcide Clever M.D.   On: 06/16/2013 21:06   US Abdomen Limited  06/16/2013   CLINICAL DATA:  Right chest and abdominal pain, history splenectomy  EXAM: US ABDOMEN LIMITED - RIGHT UPPER QUADRANT  COMPARISON:  06/24/2004  FINDINGS: Gallbladder  Sludge within gallbladder. Additionally, 1.8 cm diameter shadowing calculus within gallbladder. Borderline gallbladder wall thickening. No pericholecystic fluid. Sonographic Murphy sign present.  Common bile duct  Diameter: 3 mm diameter  Liver:  Normal appearance. Hepatopetal portal venous flow.  No right upper quadrant ascites.  Incidentally noted cyst within right kidney 2.5 x 2.5 x 2.6 cm.  IMPRESSION: Sludge and gallstone within gallbladder with borderline gallbladder wall thickening and presence of a sonographic Murphy sign, raising question of acute cholecystitis.  Small right renal cyst.   Electronically Signed   By: Ulyses Southward M.D.   On: 06/16/2013 22:56    Anti-infectives: Anti-infectives   Start     Dose/Rate Route Frequency Ordered Stop   06/17/13 0230  piperacillin-tazobactam (ZOSYN) IVPB 3.375 g     3.375 g 12.5  mL/hr over 240 Minutes Intravenous Every 8 hours 06/17/13 0203     06/16/13 2200  levofloxacin (LEVAQUIN) IVPB 750 mg     750 mg 100 mL/hr over 90 Minutes Intravenous  Once 06/16/13 2146 06/17/13 0032       Assessment/Plan  1. Acute cholecystitis Patient Active Problem List   Diagnosis Date Noted  . Acute cholecystitis 06/17/2013  . Chest pain 06/17/2013  . Pulmonary infiltrate in left lung on chest x-ray 06/17/2013  . Hypertension   . CAD (coronary artery disease)    Plan: 1. Cont cardiac workup and clearance.  If patient is cleared, will proceed with a lap chole.  If not cleared then she will need a perc drain. 2. Cont NPO for now, if no further cardiac work up done later today, may be  able to have clear liquids and NPO p MN 3. Check labs today. 4. Cont abx therapy   LOS: 2 days    OSBORNE,KELLY E 06/18/2013, 9:17 AM Pager: [phone]

## 2013-06-18 NOTE — Progress Notes (Signed)
I have seen and examined the patient and agree with the assessment and plans.  Douglas A. Blackman  MD, FACS  

## 2013-06-18 NOTE — Progress Notes (Signed)
Patient resting this am. Complained of pain this am during bedside reporting, IV medication given shortly after, that was effective. No other complaints, just requesting the MD this am.

## 2013-06-18 NOTE — Progress Notes (Signed)
  Echocardiogram 2D Echocardiogram has been performed.  Cathie Beams 06/18/2013, 12:23 PM

## 2013-06-19 ENCOUNTER — Encounter (HOSPITAL_COMMUNITY): Admission: EM | Disposition: A | Discharge status: Skilled Nursing Facility | Payer: Self-pay | Source: Home / Self Care

## 2013-06-19 ENCOUNTER — Inpatient Hospital Stay (HOSPITAL_COMMUNITY): Payer: Medicare Other | Admitting: Certified Registered"

## 2013-06-19 ENCOUNTER — Encounter (HOSPITAL_COMMUNITY): Payer: Medicare Other | Admitting: Certified Registered"

## 2013-06-19 ENCOUNTER — Encounter (HOSPITAL_COMMUNITY): Payer: Self-pay | Admitting: Anesthesiology

## 2013-06-19 HISTORY — PX: CHOLECYSTECTOMY: SHX55

## 2013-06-19 LAB — BASIC METABOLIC PANEL
BUN: 26 mg/dL — ABNORMAL HIGH (ref 6–23)
CO2: 26 mEq/L (ref 19–32)
Calcium: 9.1 mg/dL (ref 8.4–10.5)
Chloride: 104 mEq/L (ref 96–112)
Creatinine, Ser: 1.09 mg/dL (ref 0.50–1.10)
GFR calc Af Amer: 53 mL/min — ABNORMAL LOW (ref >90)
GFR calc non Af Amer: 46 mL/min — ABNORMAL LOW (ref >90)
Glucose, Bld: 147 mg/dL — ABNORMAL HIGH (ref 70–99)
Potassium: 3.6 mEq/L (ref 3.5–5.1)
Sodium: 140 mEq/L (ref 135–145)

## 2013-06-19 LAB — URINE CULTURE: Colony Count: >=100000

## 2013-06-19 SURGERY — LAPAROSCOPIC CHOLECYSTECTOMY
Anesthesia: General | Site: Abdomen | Wound class: Contaminated

## 2013-06-19 MED ORDER — MORPHINE SULFATE 2 MG/ML IJ SOLN: 2.0000 mg | INTRAMUSCULAR | Status: DC | PRN

## 2013-06-19 MED ORDER — SODIUM CHLORIDE 0.9 % IV SOLN
INTRAVENOUS | Status: DC
  Administered 2013-06-19 – 2013-06-20 (×2): via INTRAVENOUS
  Administered 2013-06-21: 10 mL/h via INTRAVENOUS

## 2013-06-19 MED ORDER — METOCLOPRAMIDE HCL 5 MG/ML IJ SOLN: 10.0000 mg | Freq: Once | INTRAMUSCULAR | Status: DC | PRN

## 2013-06-19 MED ORDER — HEPARIN SODIUM (PORCINE) 5000 UNIT/ML IJ SOLN
5000.0000 [IU] | Freq: Three times a day (TID) | INTRAMUSCULAR | Status: DC
  Administered 2013-06-19 – 2013-06-21 (×4): 5000 [IU] via SUBCUTANEOUS
  Filled 2013-06-19 (×8): qty 1

## 2013-06-19 MED ORDER — NEOSTIGMINE METHYLSULFATE 1 MG/ML IJ SOLN
INTRAMUSCULAR | Status: DC | PRN
  Administered 2013-06-19: 5 mg via INTRAVENOUS

## 2013-06-19 MED ORDER — PROPOFOL 10 MG/ML IV BOLUS
INTRAVENOUS | Status: DC | PRN
  Administered 2013-06-19: 150 mg via INTRAVENOUS

## 2013-06-19 MED ORDER — DEXAMETHASONE SODIUM PHOSPHATE 4 MG/ML IJ SOLN
INTRAMUSCULAR | Status: DC | PRN
  Administered 2013-06-19: 4 mg via INTRAVENOUS

## 2013-06-19 MED ORDER — BUPIVACAINE-EPINEPHRINE PF 0.25-1:200000 % IJ SOLN
INTRAMUSCULAR | Status: AC
  Filled 2013-06-19: qty 30

## 2013-06-19 MED ORDER — FENTANYL CITRATE 0.05 MG/ML IJ SOLN
INTRAMUSCULAR | Status: DC | PRN
  Administered 2013-06-19: 50 ug via INTRAVENOUS

## 2013-06-19 MED ORDER — ONDANSETRON HCL 4 MG/2ML IJ SOLN
INTRAMUSCULAR | Status: DC | PRN
  Administered 2013-06-19: 4 mg via INTRAVENOUS

## 2013-06-19 MED ORDER — SODIUM CHLORIDE 0.9 % IV SOLN: INTRAVENOUS | Status: DC

## 2013-06-19 MED ORDER — 0.9 % SODIUM CHLORIDE (POUR BTL) OPTIME
TOPICAL | Status: DC | PRN
  Administered 2013-06-19: 1000 mL

## 2013-06-19 MED ORDER — HYDROCODONE-ACETAMINOPHEN 5-325 MG PO TABS
1.0000 | ORAL_TABLET | ORAL | Status: DC | PRN
  Administered 2013-06-20: 2 via ORAL
  Administered 2013-06-20 (×2): 1 via ORAL
  Administered 2013-06-21 (×2): 2 via ORAL
  Filled 2013-06-19: qty 1
  Filled 2013-06-19: qty 2
  Filled 2013-06-19: qty 1
  Filled 2013-06-19 (×2): qty 2

## 2013-06-19 MED ORDER — ARTIFICIAL TEARS OP OINT
TOPICAL_OINTMENT | OPHTHALMIC | Status: DC | PRN
  Administered 2013-06-19: 1 via OPHTHALMIC

## 2013-06-19 MED ORDER — LACTATED RINGERS IV SOLN
INTRAVENOUS | Status: DC
  Administered 2013-06-19 (×2): via INTRAVENOUS

## 2013-06-19 MED ORDER — SODIUM CHLORIDE 0.9 % IR SOLN
Status: DC | PRN
  Administered 2013-06-19: 1000 mL

## 2013-06-19 MED ORDER — HEMOSTATIC AGENTS (NO CHARGE) OPTIME
TOPICAL | Status: DC | PRN
  Administered 2013-06-19: 1 via TOPICAL

## 2013-06-19 MED ORDER — ROCURONIUM BROMIDE 100 MG/10ML IV SOLN
INTRAVENOUS | Status: DC | PRN
  Administered 2013-06-19: 40 mg via INTRAVENOUS
  Administered 2013-06-19: 5 mg via INTRAVENOUS

## 2013-06-19 MED ORDER — FENTANYL CITRATE 0.05 MG/ML IJ SOLN: 25.0000 ug | INTRAMUSCULAR | Status: DC | PRN

## 2013-06-19 MED ORDER — BACLOFEN 20 MG PO TABS
20.0000 mg | ORAL_TABLET | Freq: Once | ORAL | Status: AC
  Administered 2013-06-19: 20 mg via ORAL
  Filled 2013-06-19: qty 1

## 2013-06-19 MED ORDER — BUPIVACAINE-EPINEPHRINE 0.25% -1:200000 IJ SOLN
INTRAMUSCULAR | Status: DC | PRN
  Administered 2013-06-19: 20 mL

## 2013-06-19 MED ORDER — LIDOCAINE HCL (CARDIAC) 20 MG/ML IV SOLN
INTRAVENOUS | Status: DC | PRN
  Administered 2013-06-19: 40 mg via INTRAVENOUS

## 2013-06-19 MED ORDER — GLYCOPYRROLATE 0.2 MG/ML IJ SOLN
INTRAMUSCULAR | Status: DC | PRN
  Administered 2013-06-19: .8 mg via INTRAVENOUS

## 2013-06-19 SURGICAL SUPPLY — 39 items
APPLIER CLIP 5 13 M/L LIGAMAX5 (MISCELLANEOUS) ×3
BANDAGE ADHESIVE 1X3 (GAUZE/BANDAGES/DRESSINGS) ×12 IMPLANT
BENZOIN TINCTURE PRP APPL 2/3 (GAUZE/BANDAGES/DRESSINGS) ×3 IMPLANT
CANISTER SUCTION 2500CC (MISCELLANEOUS) ×3 IMPLANT
CHLORAPREP W/TINT 26ML (MISCELLANEOUS) ×3 IMPLANT
CLIP APPLIE 5 13 M/L LIGAMAX5 (MISCELLANEOUS) ×2 IMPLANT
CLSR STERI-STRIP ANTIMIC 1/2X4 (GAUZE/BANDAGES/DRESSINGS) ×3 IMPLANT
COVER MAYO STAND STRL (DRAPES) IMPLANT
COVER SURGICAL LIGHT HANDLE (MISCELLANEOUS) ×3 IMPLANT
DECANTER SPIKE VIAL GLASS SM (MISCELLANEOUS) ×3 IMPLANT
DRAPE C-ARM 42X72 X-RAY (DRAPES) IMPLANT
ELECT REM PT RETURN 9FT ADLT (ELECTROSURGICAL) ×3
ELECTRODE REM PT RTRN 9FT ADLT (ELECTROSURGICAL) ×2 IMPLANT
GLOVE BIO SURGEON STRL SZ7.5 (GLOVE) ×3 IMPLANT
GLOVE BIOGEL PI IND STRL 6.5 (GLOVE) ×2 IMPLANT
GLOVE BIOGEL PI IND STRL 7.5 (GLOVE) ×2 IMPLANT
GLOVE BIOGEL PI INDICATOR 6.5 (GLOVE) ×1
GLOVE BIOGEL PI INDICATOR 7.5 (GLOVE) ×1
GLOVE ORTHOPEDIC STR SZ6.5 (GLOVE) ×3 IMPLANT
GLOVE SURG SIGNA 7.5 PF LTX (GLOVE) ×3 IMPLANT
GOWN STRL NON-REIN LRG LVL3 (GOWN DISPOSABLE) ×9 IMPLANT
GOWN STRL REIN XL XLG (GOWN DISPOSABLE) ×3 IMPLANT
HEMOSTAT SNOW SURGICEL 2X4 (HEMOSTASIS) ×3 IMPLANT
KIT BASIN OR (CUSTOM PROCEDURE TRAY) ×3 IMPLANT
KIT ROOM TURNOVER OR (KITS) ×3 IMPLANT
NS IRRIG 1000ML POUR BTL (IV SOLUTION) ×3 IMPLANT
PAD ARMBOARD 7.5X6 YLW CONV (MISCELLANEOUS) ×3 IMPLANT
POUCH SPECIMEN RETRIEVAL 10MM (ENDOMECHANICALS) ×3 IMPLANT
SCISSORS LAP 5X35 DISP (ENDOMECHANICALS) IMPLANT
SET CHOLANGIOGRAPH 5 50 .035 (SET/KITS/TRAYS/PACK) IMPLANT
SET IRRIG TUBING LAPAROSCOPIC (IRRIGATION / IRRIGATOR) ×3 IMPLANT
SLEEVE ENDOPATH XCEL 5M (ENDOMECHANICALS) ×6 IMPLANT
SPECIMEN JAR SMALL (MISCELLANEOUS) ×3 IMPLANT
SUT MON AB 4-0 PC3 18 (SUTURE) ×6 IMPLANT
TOWEL OR 17X24 6PK STRL BLUE (TOWEL DISPOSABLE) ×3 IMPLANT
TOWEL OR 17X26 10 PK STRL BLUE (TOWEL DISPOSABLE) ×3 IMPLANT
TRAY LAPAROSCOPIC (CUSTOM PROCEDURE TRAY) ×3 IMPLANT
TROCAR XCEL BLUNT TIP 100MML (ENDOMECHANICALS) ×3 IMPLANT
TROCAR XCEL NON-BLD 5MMX100MML (ENDOMECHANICALS) ×3 IMPLANT

## 2013-06-19 NOTE — Anesthesia Preprocedure Evaluation (Addendum)
Anesthesia Evaluation  Patient identified by MRN, date of birth, ID band Patient awake    Reviewed: Allergy & Precautions, H&P , NPO status , Patient's Chart, lab work & pertinent test results, reviewed documented beta blocker date and time   Airway Mallampati: II TM Distance: >3 FB Neck ROM: full    Dental  (+) Dental Advisory Given, Edentulous Upper and Edentulous Lower   Pulmonary neg pulmonary ROS,  breath sounds clear to auscultation        Cardiovascular hypertension, On Medications + CAD negative cardio ROS  Rhythm:regular     Neuro/Psych negative neurological ROS  negative psych ROS   GI/Hepatic negative GI ROS, Neg liver ROS,   Endo/Other  negative endocrine ROS  Renal/GU negative Renal ROS  negative genitourinary   Musculoskeletal   Abdominal   Peds  Hematology negative hematology ROS (+)   Anesthesia Other Findings See surgeon's H&P   Reproductive/Obstetrics negative OB ROS                         Anesthesia Physical Anesthesia Plan  ASA: III  Anesthesia Plan: General   Post-op Pain Management:    Induction: Intravenous  Airway Management Planned: Oral ETT  Additional Equipment:   Intra-op Plan:   Post-operative Plan: Extubation in OR  Informed Consent: I have reviewed the patients History and Physical, chart, labs and discussed the procedure including the risks, benefits and alternatives for the proposed anesthesia with the patient or authorized representative who has indicated his/her understanding and acceptance.   Dental Advisory Given  Plan Discussed with: CRNA and Surgeon  Anesthesia Plan Comments:        Anesthesia Quick Evaluation

## 2013-06-19 NOTE — Progress Notes (Signed)
Utilization Review Completed.Dowell, Deborah T11/11/2012  

## 2013-06-19 NOTE — Transfer of Care (Signed)
Immediate Anesthesia Transfer of Care Note  Patient: [name]  Procedure(s) Performed: Procedure(s): LAPAROSCOPIC CHOLECYSTECTOMY (N/A)  Patient Location: PACU  Anesthesia Type:General  Level of Consciousness: awake, alert  and patient cooperative  Airway & Oxygen Therapy: Patient Spontanous Breathing and Patient connected to nasal cannula oxygen  Post-op Assessment: Report given to PACU RN and Post -op Vital signs reviewed and stable  Post vital signs: Reviewed and stable  Complications: No apparent anesthesia complications

## 2013-06-19 NOTE — Op Note (Signed)
Laparoscopic Cholecystectomy Procedure Note  Indications: This patient presents with symptomatic gallbladder disease and will undergo laparoscopic cholecystectomy.  Pre-operative Diagnosis: Calculus of gallbladder with acute cholecystitis, without mention of obstruction  Post-operative Diagnosis: Same  Surgeon: Abigail Miyamoto A   Assistants: 0  Anesthesia: General endotracheal anesthesia  ASA Class: 3  Procedure Details  The patient was seen again in the Holding Room. The risks, benefits, complications, treatment options, and expected outcomes were discussed with the patient. The possibilities of reaction to medication, pulmonary aspiration, perforation of viscus, bleeding, recurrent infection, finding a normal gallbladder, the need for additional procedures, failure to diagnose a condition, the possible need to convert to an open procedure, and creating a complication requiring transfusion or operation were discussed with the patient. The likelihood of improving the patient's symptoms with return to their baseline status is good.  The patient and/or family concurred with the proposed plan, giving informed consent. The site of surgery properly noted. The patient was taken to Operating Room, identified as [name] and the procedure verified as Laparoscopic Cholecystectomy with Intraoperative Cholangiogram. A Time Out was held and the above information confirmed.  Prior to the induction of general anesthesia, antibiotic prophylaxis was administered. General endotracheal anesthesia was then administered and tolerated well. After the induction, the abdomen was prepped with Chloraprep and draped in sterile fashion. The patient was positioned in the supine position.  Local anesthetic agent was injected into the skin near the umbilicus and an incision made. We dissected down to the abdominal fascia with blunt dissection.  The fascia was incised vertically and we entered the peritoneal cavity  bluntly.  A pursestring suture of 0-Vicryl was placed around the fascial opening.  The Hasson cannula was inserted and secured with the stay suture.  Pneumoperitoneum was then created with CO2 and tolerated well without any adverse changes in the patient's vital signs. An 11-mm port was placed in the subxiphoid position.  Two 5-mm ports were placed in the right upper quadrant. All skin incisions were infiltrated with a local anesthetic agent before making the incision and placing the trocars.   We positioned the patient in reverse Trendelenburg, tilted slightly to the patient's left.  The gallbladder was identified, the fundus grasped and retracted cephalad. Adhesions were lysed bluntly and with the electrocautery where indicated, taking care not to injure any adjacent organs or viscus. The infundibulum was grasped and retracted laterally, exposing the peritoneum overlying the triangle of Calot. This was then divided and exposed in a blunt fashion. The cystic duct was clearly identified and bluntly dissected circumferentially. A critical view of the cystic duct and cystic artery was obtained.  The cystic duct was then ligated with clips and divided. The cystic artery was, dissected free, ligated with clips and divided as well.   The gallbladder was dissected from the liver bed in retrograde fashion with the electrocautery. The gallbladder was removed and placed in an Endocatch sac. The liver bed was irrigated and inspected. Hemostasis was achieved with the electrocautery and a piece of surgical snow. Copious irrigation was utilized and was repeatedly aspirated until clear.  The gallbladder and Endocatch sac were then removed through the umbilical port site.  The pursestring suture was used to close the umbilical fascia.    We again inspected the right upper quadrant for hemostasis.  Pneumoperitoneum was released as we removed the trocars.  4-0 Monocryl was used to close the skin.   Benzoin, steri-strips, and  clean dressings were applied. The patient was then  extubated and brought to the recovery room in stable condition. Instrument, sponge, and needle counts were correct at closure and at the conclusion of the case.   Findings: Cholecystitis with Cholelithiasis  Estimated Blood Loss: Minimal         Drains: 0         Specimens: Gallbladder           Complications: None; patient tolerated the procedure well.         Disposition: PACU - hemodynamically stable.         Condition: stable

## 2013-06-19 NOTE — Anesthesia Postprocedure Evaluation (Signed)
Anesthesia Post Note  Patient: [name]  Procedure(s) Performed: Procedure(s) (LRB): LAPAROSCOPIC CHOLECYSTECTOMY (N/A)  Anesthesia type: General  Patient location: PACU  Post pain: Pain level controlled  Post assessment: Patient's Cardiovascular Status Stable  Last Vitals:  Filed Vitals:   06/19/13 1718  BP: 124/55  Pulse: 82  Temp: 36.7 C  Resp: 18    Post vital signs: Reviewed and stable  Level of consciousness: alert  Complications: No apparent anesthesia complications

## 2013-06-19 NOTE — Anesthesia Procedure Notes (Signed)
Procedure Name: Intubation Date/Time: 06/19/2013 11:31 AM Performed by: De Nurse Pre-anesthesia Checklist: Patient identified, Emergency Drugs available, Suction available, Patient being monitored and Timeout performed Patient Re-evaluated:Patient Re-evaluated prior to inductionOxygen Delivery Method: Circle system utilized Preoxygenation: Pre-oxygenation with 100% oxygen Intubation Type: IV induction Ventilation: Mask ventilation without difficulty Laryngoscope Size: Mac and 3 Grade View: Grade I Tube type: Oral Tube size: 7.0 mm Number of attempts: 1 Airway Equipment and Method: Stylet Placement Confirmation: ETT inserted through vocal cords under direct vision,  positive ETCO2 and breath sounds checked- equal and bilateral Secured at: 21 cm Tube secured with: Tape Dental Injury: Teeth and Oropharynx as per pre-operative assessment

## 2013-06-19 NOTE — Progress Notes (Signed)
  Subjective: Pt up to bsc. Echo completed. C/o abdominal pain, cramping.  Denies n/v  Objective: Vital signs in last 24 hours: Temp:  [98.3 F (36.8 C)-99.9 F (37.7 C)] 99.9 F (37.7 C) (11/04 0435) Pulse Rate:  [76-92] 92 (11/04 0435) Resp:  [18-20] 18 (11/04 0435) BP: (103-141)/(41-83) 114/83 mmHg (11/04 0435) SpO2:  [94 %-99 %] 99 % (11/04 0435) Last BM Date: 06/15/13  Intake/Output from previous day: 11/03 0701 - 11/04 0700 In: 1201.7 [P.O.:480; I.V.:721.7] Out: 650 [Urine:650] Intake/Output this shift:    General appearance: alert, cooperative, appears stated age and no distress Cardio: regular rate and rhythm, S1, S2 normal, no murmur, click, rub or gallop GI: +bs abdomen is soft round and diffusely tender, no guarding or peritonitis. Extremities: extremities normal, atraumatic, no cyanosis or edema  Lab Results:   Recent Labs  06/16/13 1958 06/18/13 1030  WBC 9.1 12.0*  HGB 13.2 12.5  HCT 38.3 37.6  PLT 249 219   BMET  Recent Labs  06/16/13 1958 06/18/13 1030  NA 137 140  K 4.1 3.8  CL 98 104  CO2 29 26  GLUCOSE 124* 102*  BUN 31* 27*  CREATININE 0.92 1.41*  CALCIUM 10.0 9.1   PT/INR No results found for this basename: LABPROT, INR,  in the last 72 hours ABG No results found for this basename: PHART, PCO2, PO2, HCO3,  in the last 72 hours  Studies/Results: No results found.  Anti-infectives: Anti-infectives   Start     Dose/Rate Route Frequency Ordered Stop   06/17/13 0230  piperacillin-tazobactam (ZOSYN) IVPB 3.375 g     3.375 g 12.5 mL/hr over 240 Minutes Intravenous Every 8 hours 06/17/13 0203     06/16/13 2200  levofloxacin (LEVAQUIN) IVPB 750 mg     750 mg 100 mL/hr over 90 Minutes Intravenous  Once 06/16/13 2146 06/17/13 0032      Assessment/Plan: Acute cholecystitis -plan for cholecystectomy today once we have final recommendation from cardiology.   -NPO -obtain consent -on zosyn -SCDs, start heparin post operatively,  mobilize  CAD -2D echo completed, no wall motion abnormalities.  Await cardiology recommendation Hypertension -stable UTI -zosyn 3 days, will consider repeat UA if sCr does not improve Acute renal insufficiency -increase IV fluids, continue with hctz, enalapril for now -repeat BMP in AM    LOS: 3 days    RIEBOCK, EMINA  ANP-BC  06/19/2013 7:56 AM

## 2013-06-19 NOTE — Progress Notes (Signed)
[facility] CONSULT PROGRESS NOTE  Assessment/Plan: *Acute cholecystitis: - surgery today. - zosyn.  Chest pain: - management per cardiology Echo done.    Hypertension - stable mildly. - mild elevation in Cr, d/c HCTZ and Vasotec cont IV fluids. b-met in am. - Change IV fluids to NS, K might increase with elevation in Cr. - Avoid Potassium containing solutions.    Procedures:  Ct adb  Antibiotics:  Echo  HPI/Subjective: No complains  Objective: Filed Vitals:   06/18/13 1004 06/18/13 1829 06/18/13 2138 06/19/13 0435  BP: 105/41 103/61 132/53 114/83  Pulse: 81 78 83 92  Temp:  99.4 F (37.4 C) 99 F (37.2 C) 99.9 F (37.7 C)  TempSrc:   Oral Oral  Resp:  20 18 18   Height:      Weight:      SpO2:  94% 95% 99%    Intake/Output Summary (Last 24 hours) at 06/19/13 1006 Last data filed at 06/19/13 0435  Gross per 24 hour  Intake 876.67 ml  Output    550 ml  Net 326.67 ml   Filed Weights   06/17/13 0100 06/17/13 2008  Weight: 89.2 kg (196 lb 10.4 oz) 89.3 kg (196 lb 13.9 oz)    Exam:  General: Alert, awake, oriented x3, in no acute distress.  Heart: Regular rate and rhythm, without murmurs, rubs, gallops.  Lungs: Good air movement, bilateral air movement.  Abdomen: deferred. Neuro: Grossly intact, nonfocal.   Data Reviewed: Basic Metabolic Panel:  Recent Labs Lab 06/16/13 1958 06/18/13 1030  NA 137 140  K 4.1 3.8  CL 98 104  CO2 29 26  GLUCOSE 124* 102*  BUN 31* 27*  CREATININE 0.92 1.41*  CALCIUM 10.0 9.1   Liver Function Tests:  Recent Labs Lab 06/16/13 1958 06/18/13 1030  AST 23 27  ALT 15 12  ALKPHOS 58 50  BILITOT 0.4 0.5  PROT 6.5 6.0  ALBUMIN 3.4* 2.7*    Recent Labs Lab 06/16/13 1958  LIPASE 29   No results found for this basename: AMMONIA,  in the last 168 hours CBC:  Recent Labs Lab 06/16/13 1958 06/18/13 1030  WBC 9.1 12.0*  HGB 13.2 12.5  HCT 38.3 37.6  MCV 95.8 96.4  PLT 249 219   Cardiac  Enzymes:  Recent Labs Lab 06/16/13 1958 06/17/13 0812 06/17/13 1122 06/17/13 1830  TROPONINI <0.30 <0.30 <0.30 <0.30   BNP (last 3 results) No results found for this basename: PROBNP,  in the last 8760 hours CBG: No results found for this basename: GLUCAP,  in the last 168 hours  Recent Results (from the past 240 hour(s))  URINE CULTURE     Status: None   Collection Time    06/16/13 11:18 PM      Result Value Range Status   Specimen Description URINE, CLEAN CATCH   Final   Special Requests NONE   Final   Culture  Setup Time     Final   Value: 06/16/2013 01:02     Performed at [facility] Count     Final   Value: >=100,000 COLONIES/ML     Performed at [facility]   Culture     Final   Value: ESCHERICHIA COLI     Performed at [facility]   Report Status 06/19/2013 FINAL   Final   Organism ID, Bacteria ESCHERICHIA COLI   Final     Studies: No results found.  Scheduled Meds: . amLODipine  2.5 mg Oral q morning - 10a  . heparin subcutaneous  5,000 Units Subcutaneous Q8H  . isosorbide mononitrate  60 mg Oral Daily  . pantoprazole  40 mg Oral Daily  . piperacillin-tazobactam (ZOSYN)  IV  3.375 g Intravenous Q8H   Continuous Infusions: . 0.9 % NaCl with KCl 20 mEq / L 125 mL/hr at 06/19/13 0855     Marinda Elk  [facility] Pager [phone]. If 8PM-8AM, please contact night-coverage at [email or web address], password [facility] 06/19/2013, 10:06 AM  LOS: 3 days

## 2013-06-19 NOTE — Progress Notes (Signed)
I have seen and examined the patient and agree with the assessment and plans. Plan lap chole today.  I discussed this with the patient and her family in detail.  I discussed the risks which include but are not limited to bleeding, infection, bile duct injury, bile leak, injury to surrounding structures, the need to convert to an open procedures, cardiopulmonary problems, etc.  She agrees to proceed.  Douglas A. Magnus Ivan  MD, FACS

## 2013-06-19 NOTE — Preoperative (Signed)
Beta Blockers   Reason not to administer Beta Blockers:Not Applicable 

## 2013-06-19 NOTE — Plan of Care (Signed)
Problem: Phase I Progression Outcomes Goal: Incision/dressings dry and intact Outcome: Progressing Explained to patient and family about the puncture sites that would be present post operatively.

## 2013-06-20 DIAGNOSIS — M109 Gout, unspecified: Secondary | ICD-10-CM

## 2013-06-20 DIAGNOSIS — R5381 Other malaise: Secondary | ICD-10-CM

## 2013-06-20 DIAGNOSIS — R918 Other nonspecific abnormal finding of lung field: Secondary | ICD-10-CM

## 2013-06-20 LAB — BASIC METABOLIC PANEL
BUN: 31 mg/dL — ABNORMAL HIGH (ref 6–23)
CO2: 24 mEq/L (ref 19–32)
Calcium: 9.5 mg/dL (ref 8.4–10.5)
Chloride: 108 mEq/L (ref 96–112)
Creatinine, Ser: 1.05 mg/dL (ref 0.50–1.10)
GFR calc Af Amer: 56 mL/min — ABNORMAL LOW (ref >90)
GFR calc non Af Amer: 48 mL/min — ABNORMAL LOW (ref >90)
Glucose, Bld: 129 mg/dL — ABNORMAL HIGH (ref 70–99)
Potassium: 4 mEq/L (ref 3.5–5.1)
Sodium: 142 mEq/L (ref 135–145)

## 2013-06-20 MED ORDER — COLCHICINE 0.6 MG PO TABS
0.6000 mg | ORAL_TABLET | Freq: Two times a day (BID) | ORAL | Status: DC
  Administered 2013-06-20 – 2013-06-21 (×3): 0.6 mg via ORAL
  Filled 2013-06-20 (×4): qty 1

## 2013-06-20 MED ORDER — COLCHICINE 0.6 MG PO TABS
0.6000 mg | ORAL_TABLET | Freq: Every day | ORAL | Status: DC
  Filled 2013-06-20: qty 1

## 2013-06-20 MED ORDER — CIPROFLOXACIN HCL 500 MG PO TABS
500.0000 mg | ORAL_TABLET | Freq: Two times a day (BID) | ORAL | Status: DC
  Administered 2013-06-20 – 2013-06-21 (×3): 500 mg via ORAL
  Filled 2013-06-20 (×5): qty 1

## 2013-06-20 NOTE — Progress Notes (Signed)
I have seen and examined the patient and agree with the assessment and plans.  Douglas A. Blackman  MD, FACS  

## 2013-06-20 NOTE — Progress Notes (Signed)
Patient ID: [name], female   DOB: 1929/03/18, 79 y.o.   MRN: [id number] 1 Day Post-Op  Subjective: Pt feels ok today.  Had some pain this am, but got pain meds and is controlled.  Mild nausea.  Eating clear liquids.  Doesn't want to move, feels too weak.  But also wanting to go home where she lives by herself.  Objective: Vital signs in last 24 hours: Temp:  [97.7 F (36.5 C)-100.6 F (38.1 C)] 98 F (36.7 C) (11/05 0542) Pulse Rate:  [74-83] 82 (11/05 0542) Resp:  [17-22] 18 (11/05 0542) BP: (86-180)/(55-94) 180/94 mmHg (11/05 0542) SpO2:  [92 %-96 %] 96 % (11/05 0542) Weight:  [198 lb 1.6 oz (89.858 kg)] 198 lb 1.6 oz (89.858 kg) (11/04 2059) Last BM Date:  (prior to  admission)  Intake/Output from previous day: 11/04 0701 - 11/05 0700 In: 1400 [I.V.:1400] Out: 250 [Urine:250] Intake/Output this shift: Total I/O In: -  Out: 400 [Urine:400]  PE: Abd: soft, appropriately tender, +BS, ND, incisions c/d/i Heart: regular Lungs: CTAB  Lab Results:   Recent Labs  06/18/13 1030  WBC 12.0*  HGB 12.5  HCT 37.6  PLT 219   BMET  Recent Labs  06/19/13 1649 06/20/13 0707  NA 140 142  K 3.6 4.0  CL 104 108  CO2 26 24  GLUCOSE 147* 129*  BUN 26* 31*  CREATININE 1.09 1.05  CALCIUM 9.1 9.5   PT/INR No results found for this basename: LABPROT, INR,  in the last 72 hours CMP     Component Value Date/Time   NA 142 06/20/2013 0707   K 4.0 06/20/2013 0707   CL 108 06/20/2013 0707   CO2 24 06/20/2013 0707   GLUCOSE 129* 06/20/2013 0707   BUN 31* 06/20/2013 0707   CREATININE 1.05 06/20/2013 0707   CALCIUM 9.5 06/20/2013 0707   PROT 6.0 06/18/2013 1030   ALBUMIN 2.7* 06/18/2013 1030   AST 27 06/18/2013 1030   ALT 12 06/18/2013 1030   ALKPHOS 50 06/18/2013 1030   BILITOT 0.5 06/18/2013 1030   GFRNONAA 48* 06/20/2013 0707   GFRAA 56* 06/20/2013 0707   Lipase     Component Value Date/Time   LIPASE 29 06/16/2013 1958       Studies/Results: No results  found.  Anti-infectives: Anti-infectives   Start     Dose/Rate Route Frequency Ordered Stop   06/20/13 0830  ciprofloxacin (CIPRO) tablet 500 mg     500 mg Oral 2 times daily 06/20/13 0829     06/17/13 0230  piperacillin-tazobactam (ZOSYN) IVPB 3.375 g  Status:  Discontinued     3.375 g 12.5 mL/hr over 240 Minutes Intravenous Every 8 hours 06/17/13 0203 06/20/13 0829   06/16/13 2200  levofloxacin (LEVAQUIN) IVPB 750 mg     750 mg 100 mL/hr over 90 Minutes Intravenous  Once 06/16/13 2146 06/17/13 0032       Assessment/Plan  1. S/p lap chole for acute cholecystitis 2. deconditioning Patient Active Problem List   Diagnosis Date Noted  . Acute cholecystitis 06/17/2013  . Chest pain 06/17/2013  . Pulmonary infiltrate in left lung on chest x-ray 06/17/2013  . Hypertension   . CAD (coronary artery disease)    Plan: 1. Will advance diet today 2. Change to oral pain meds 3. Change to oral abx therapy 4. PT eval today to determine what needs the patient may have prior to going home. 5. Hopeful dc tomorrow.    LOS: 4 days  OSBORNE,KELLY E 06/20/2013, 8:30 AM Pager: [phone]

## 2013-06-20 NOTE — Progress Notes (Signed)
[facility] CONSULT PROGRESS NOTE  Assessment/Plan: *Acute cholecystitis:  Patient tolerating by mouth intake. Surgery planning on advance her diet, changing to oral antimicrobial therapy. She appears to be improving.  Chest pain: - management per cardiology Echo done.    Hypertension -Blood pressures appear overall stable, with systolic blood pressures averaging in the 130s to 140s. She did have elevated blood pressure 180/94 which could be secondary to pain. Continue amlodipine 2.5 mg by mouth daily  Probable gout -Patient reporting pain to her left great toe. On exam it was warm to touch, tender, erythematous. She states having a history of gout and current presentation similar to previous episodes of gouty attacks. She has been started on colchicine. .   Procedures:  Ct adb  Antibiotics:  Echo  HPI/Subjective: She complains of pain located to her left great toe. States symptoms similar to previous episodes of acute gouty attack. Otherwise reports pain is better controlled from her laparoscopic cholecystectomy yesterday. Objective: Filed Vitals:   06/19/13 1718 06/19/13 2059 06/20/13 0542 06/20/13 0928  BP: 124/55 146/84 180/94 114/54  Pulse: 82 79 82 65  Temp: 98 F (36.7 C) 98.8 F (37.1 C) 98 F (36.7 C) 98.3 F (36.8 C)  TempSrc: Oral Oral Oral   Resp: 18 18 18 18   Height:  5\' 5"  (1.651 m)    Weight:  89.858 kg (198 lb 1.6 oz)    SpO2: 96% 92% 96% 91%    Intake/Output Summary (Last 24 hours) at 06/20/13 1330 Last data filed at 06/20/13 0929  Gross per 24 hour  Intake    900 ml  Output    580 ml  Net    320 ml   Filed Weights   06/17/13 0100 06/17/13 2008 06/19/13 2059  Weight: 89.2 kg (196 lb 10.4 oz) 89.3 kg (196 lb 13.9 oz) 89.858 kg (198 lb 1.6 oz)    Exam:  General: Alert, awake, oriented x3, in no acute distress.  Heart: Regular rate and rhythm, without murmurs, rubs, gallops.  Lungs: Good air movement, bilateral air movement.  Abdomen:  deferred. Extremities: There is localized erythema and pain to palpation over her left great toe Neuro: Grossly intact, nonfocal.   Data Reviewed: Basic Metabolic Panel:  Recent Labs Lab 06/16/13 1958 06/18/13 1030 06/19/13 1649 06/20/13 0707  NA 137 140 140 142  K 4.1 3.8 3.6 4.0  CL 98 104 104 108  CO2 29 26 26 24   GLUCOSE 124* 102* 147* 129*  BUN 31* 27* 26* 31*  CREATININE 0.92 1.41* 1.09 1.05  CALCIUM 10.0 9.1 9.1 9.5   Liver Function Tests:  Recent Labs Lab 06/16/13 1958 06/18/13 1030  AST 23 27  ALT 15 12  ALKPHOS 58 50  BILITOT 0.4 0.5  PROT 6.5 6.0  ALBUMIN 3.4* 2.7*    Recent Labs Lab 06/16/13 1958  LIPASE 29   No results found for this basename: AMMONIA,  in the last 168 hours CBC:  Recent Labs Lab 06/16/13 1958 06/18/13 1030  WBC 9.1 12.0*  HGB 13.2 12.5  HCT 38.3 37.6  MCV 95.8 96.4  PLT 249 219   Cardiac Enzymes:  Recent Labs Lab 06/16/13 1958 06/17/13 0812 06/17/13 1122 06/17/13 1830  TROPONINI <0.30 <0.30 <0.30 <0.30   BNP (last 3 results) No results found for this basename: PROBNP,  in the last 8760 hours CBG: No results found for this basename: GLUCAP,  in the last 168 hours  Recent Results (from the past 240 hour(s))  URINE  CULTURE     Status: None   Collection Time    06/16/13 11:18 PM      Result Value Range Status   Specimen Description URINE, CLEAN CATCH   Final   Special Requests NONE   Final   Culture  Setup Time     Final   Value: 06/16/2013 01:02     Performed at [facility] Count     Final   Value: >=100,000 COLONIES/ML     Performed at [facility]   Culture     Final   Value: ESCHERICHIA COLI     Performed at [facility]   Report Status 06/19/2013 FINAL   Final   Organism ID, Bacteria ESCHERICHIA COLI   Final     Studies: No results found.  Scheduled Meds: . amLODipine  2.5 mg Oral q morning - 10a  . ciprofloxacin  500 mg Oral BID  . colchicine  0.6 mg Oral  BID  . heparin subcutaneous  5,000 Units Subcutaneous Q8H  . isosorbide mononitrate  60 mg Oral Daily  . pantoprazole  40 mg Oral Daily   Continuous Infusions: . sodium chloride 50 mL/hr at 06/20/13 1244     Jeralyn Bennett  [facility] Pager [phone]. If 8PM-8AM, please contact night-coverage at [email or web address], password [facility] 06/20/2013, 1:30 PM  LOS: 4 days

## 2013-06-20 NOTE — Evaluation (Signed)
Physical Therapy Evaluation Patient Details Name: [name] MRN: [id number] DOB: [birth year] Today's Date: 06/20/2013 Time: 4010-2725 PT Time Calculation (min): 22 min  PT Assessment / Plan / Recommendation History of Present Illness   Pt is a 79 y.o. Adm due to RUQ abdominal pain, n/v. PMH includes HTN, CAD, back surgery, knee surgery, carpal tunnel repair and hernia repair.   Clinical Impression  Patient is s/p laparoscopic cholecystectomy surgery resulting in functional limitations due to the deficits listed below (see PT Problem List).  Patient will benefit from skilled PT to increase their independence and safety with mobility to allow discharge to the venue listed below. Pt is highly motivated to return to her PLOF.      PT Assessment  Patient needs continued PT services    Follow Up Recommendations  Home health PT;Supervision/Assistance - 24 hour    Does the patient have the potential to tolerate intense rehabilitation      Barriers to Discharge Decreased caregiver support pt lives at independent living; son reports he can stay with her till she is functionining at independent level again     Equipment Recommendations  3in1 (PT);Rolling walker with 5" wheels    Recommendations for Other Services OT consult   Frequency Min 4X/week    Precautions / Restrictions Precautions Precautions: Fall Restrictions Weight Bearing Restrictions: No   Pertinent Vitals/Pain C/o pain in Lt foot; attributes this to a "gout flare up"; pt premedicated.       Mobility  Bed Mobility Bed Mobility: Sit to Supine Sit to Supine: 6: Modified independent (Device/Increase time);HOB flat Details for Bed Mobility Assistance: required increased time; demo good technique  Transfers Transfers: Sit to Stand;Stand to Sit Sit to Stand: 3: Mod assist;From chair/3-in-1;With upper extremity assist;With armrests Stand to Sit: 4: Min assist;To bed;To elevated surface;With upper extremity  assist Details for Transfer Assistance: pt with increased difficulty powering up from lower surface (ie recliner) required cues and (A) to acheive upright standing position; cues for safe hand placment with RW  Ambulation/Gait Ambulation/Gait Assistance: 4: Min guard Ambulation Distance (Feet): 12 Feet Assistive device: Rolling walker Ambulation/Gait Assistance Details: pt able to converse when amb on RA; demo SOB at end of session; min guard to steady and for safety; cues for upright posture and management of RW  Gait Pattern: Step-through pattern;Trunk flexed;Wide base of support;Decreased stride length Gait velocity: decreased due to fatigue  Stairs: No Wheelchair Mobility Wheelchair Mobility: No    Exercises General Exercises - Lower Extremity Ankle Circles/Pumps: AROM;Both;10 reps;Supine   PT Diagnosis: Generalized weakness;Acute pain;Difficulty walking  PT Problem List: Decreased strength;Decreased activity tolerance;Decreased balance;Decreased mobility;Decreased knowledge of use of DME;Pain PT Treatment Interventions: DME instruction;Gait training;Functional mobility training;Therapeutic activities;Therapeutic exercise;Balance training;Neuromuscular re-education;Patient/family education     PT Goals(Current goals can be found in the care plan section) Acute Rehab PT Goals Patient Stated Goal: to be independent again PT Goal Formulation: With patient/family Time For Goal Achievement: 07/04/13 Potential to Achieve Goals: Good  Visit Information  Last PT Received On: 06/20/13 Assistance Needed: +1       Prior Functioning  Home Living Family/patient expects to be discharged to:: Other (Comment) Additional Comments: independent living at [facility]  Prior Function Level of Independence: Independent with assistive device(s) Comments: pt reports she ambulated with [facility]; was able to drive; responsible for fixing her breakfast and lunch; would drive to dining room for dinner   Communication Communication: No difficulties    Cognition  Cognition Arousal/Alertness: Awake/alert Behavior During Therapy: [facility]  for tasks assessed/performed Overall Cognitive Status: Within Functional Limits for tasks assessed    Extremity/Trunk Assessment Upper Extremity Assessment Upper Extremity Assessment: Overall WFL for tasks assessed Lower Extremity Assessment Lower Extremity Assessment: Generalized weakness Cervical / Trunk Assessment Cervical / Trunk Assessment: Kyphotic   Balance Balance Balance Assessed: Yes Static Sitting Balance Static Sitting - Balance Support: No upper extremity supported;Feet supported Static Sitting - Level of Assistance: 6: Modified independent (Device/Increase time) Static Sitting - Comment/# of Minutes: 3 min  End of Session PT - End of Session Equipment Utilized During Treatment: Gait belt;Oxygen Activity Tolerance: Patient limited by fatigue;Patient limited by pain Patient left: in bed;with call bell/phone within reach;with family/visitor present;with bed alarm set Nurse Communication: Mobility status;Patient requests pain meds  GP     Donell Sievert, [place] [phone] 06/20/2013, 3:24 PM

## 2013-06-21 ENCOUNTER — Encounter (HOSPITAL_COMMUNITY): Payer: Self-pay | Admitting: Surgery

## 2013-06-21 DIAGNOSIS — K219 Gastro-esophageal reflux disease without esophagitis: Secondary | ICD-10-CM | POA: Diagnosis not present

## 2013-06-21 DIAGNOSIS — K819 Cholecystitis, unspecified: Secondary | ICD-10-CM | POA: Diagnosis not present

## 2013-06-21 DIAGNOSIS — I10 Essential (primary) hypertension: Secondary | ICD-10-CM | POA: Diagnosis not present

## 2013-06-21 DIAGNOSIS — K5900 Constipation, unspecified: Secondary | ICD-10-CM | POA: Diagnosis not present

## 2013-06-21 DIAGNOSIS — Z5189 Encounter for other specified aftercare: Secondary | ICD-10-CM | POA: Diagnosis not present

## 2013-06-21 DIAGNOSIS — M109 Gout, unspecified: Secondary | ICD-10-CM | POA: Diagnosis not present

## 2013-06-21 DIAGNOSIS — E785 Hyperlipidemia, unspecified: Secondary | ICD-10-CM | POA: Diagnosis not present

## 2013-06-21 DIAGNOSIS — R5381 Other malaise: Secondary | ICD-10-CM | POA: Diagnosis not present

## 2013-06-21 DIAGNOSIS — K810 Acute cholecystitis: Secondary | ICD-10-CM | POA: Diagnosis not present

## 2013-06-21 DIAGNOSIS — K279 Peptic ulcer, site unspecified, unspecified as acute or chronic, without hemorrhage or perforation: Secondary | ICD-10-CM | POA: Diagnosis not present

## 2013-06-21 DIAGNOSIS — R079 Chest pain, unspecified: Secondary | ICD-10-CM | POA: Diagnosis not present

## 2013-06-21 DIAGNOSIS — I2510 Atherosclerotic heart disease of native coronary artery without angina pectoris: Secondary | ICD-10-CM | POA: Diagnosis not present

## 2013-06-21 MED ORDER — HYDROCODONE-ACETAMINOPHEN 5-325 MG PO TABS: 1.0000 | ORAL_TABLET | ORAL | Status: DC | PRN

## 2013-06-21 NOTE — Progress Notes (Signed)
Physical Therapy Treatment Patient Details Name: [name] MRN: [id number] DOB: February 28, 1929 Today's Date: 06/21/2013 Time: 4098-1191 PT Time Calculation (min): 25 min  PT Assessment / Plan / Recommendation  History of Present Illness     PT Comments   Pt progressing well with therapy. Pt able to minimally increase ambulation distance with min guard (A) and cues for safety. Pt reports that she does not feel comfortable discharging today due to lack of mobility. Will cont to follow up with pt per POC till acute D/C.   Follow Up Recommendations  Home health PT;Supervision/Assistance - 24 hour     Does the patient have the potential to tolerate intense rehabilitation     Barriers to Discharge        Equipment Recommendations  3in1 (PT);Rolling walker with 5" wheels    Recommendations for Other Services OT consult  Frequency Min 4X/week   Progress towards PT Goals Progress towards PT goals: Progressing toward goals  Plan Current plan remains appropriate    Precautions / Restrictions Precautions Precautions: Fall Restrictions Weight Bearing Restrictions: No   Pertinent Vitals/Pain 4/10 in abdominal region; patient repositioned for comfort     Mobility  Bed Mobility Bed Mobility: Sitting - Scoot to Edge of Bed;Rolling Left;Left Sidelying to Sit Rolling Left: 5: Supervision;With rail Left Sidelying to Sit: 6: Modified independent (Device/Increase time);HOB flat;With rails Sitting - Scoot to Edge of Bed: 6: Modified independent (Device/Increase time) Details for Bed Mobility Assistance: cues for log rolling to decrease pressure in abdominal region; pt with HOB flattened to simulate home enviroment; pt relied on handrails; required incr time due to pain Transfers Transfers: Sit to Stand;Stand to Sit;Stand Pivot Transfers Sit to Stand: 4: Min guard;From bed Stand to Sit: 4: Min guard;To chair/3-in-1;With armrests Stand Pivot Transfers: 4: Min guard;With armrests Details for  Transfer Assistance: pt immediately required SPT to [facility] to use bathroom upon standing; multimodal cues for hand placement and safety with RW; incr time to complete transfers due to pain in abdominal region  Ambulation/Gait Ambulation/Gait Assistance: 4: Min guard Ambulation Distance (Feet): 20 Feet Assistive device: Rolling walker Ambulation/Gait Assistance Details: cues for management of RW and safety with ambulation; cues for upright posture; pt stated "im scared of falling, thats why i look down", pt amb with narrow BOS and short shuffled steps  Gait Pattern: Step-through pattern;Trunk flexed;Decreased stride length;Narrow base of support Gait velocity: decreased due to fatigue  General Gait Details: pt amb on RA; no SOB or wheezing noted; pt able to converse throughout session Stairs: No Wheelchair Mobility Wheelchair Mobility: No    Exercises General Exercises - Lower Extremity Ankle Circles/Pumps: AROM;Both;10 reps;Supine Long Arc Quad: AROM;Both;10 reps;Seated Hip Flexion/Marching: AROM;Both;10 reps;Seated   PT Diagnosis:    PT Problem List:   PT Treatment Interventions:     PT Goals (current goals can now be found in the care plan section) Acute Rehab PT Goals Patient Stated Goal: to be independent again PT Goal Formulation: With patient/family Time For Goal Achievement: 07/04/13 Potential to Achieve Goals: Good  Visit Information  Last PT Received On: 06/21/13 Assistance Needed: +1    Subjective Data  Subjective: pt lying supine; states "i had a really bad night. i just dont know if i can go home today."  Patient Stated Goal: to be independent again   Cognition  Cognition Arousal/Alertness: Awake/alert Behavior During Therapy: WFL for tasks assessed/performed Overall Cognitive Status: Within Functional Limits for tasks assessed    Balance  Balance Balance Assessed:  No  End of Session PT - End of Session Equipment Utilized During Treatment: Gait belt Activity  Tolerance: Patient tolerated treatment well Patient left: in chair;with call bell/phone within reach;with family/visitor present Nurse Communication: Mobility status   GP     Donell Sievert, [place] [phone] 06/21/2013, 10:54 AM

## 2013-06-21 NOTE — Clinical Social Work Placement (Signed)
Clinical Social Work Department CLINICAL SOCIAL WORK PLACEMENT NOTE 06/21/2013  Patient:  [name], [name]  Account Number:  [id number] Admit date:  06/16/2013  Clinical Social Worker:  Genelle Bal, LCSW  Date/time:  06/21/2013 06:19 AM  Clinical Social Work is seeking post-discharge placement for this patient at the following level of care:   SKILLED NURSING   (*CSW will update this form in Epic as items are completed)     Patient/family provided with [facility] Department of Clinical Social Work's list of facilities offering this level of care within the geographic area requested by the patient (or if unable, by the patient's family).  06/21/2013  Patient/family informed of their freedom to choose among providers that offer the needed level of care, that participate in Medicare, Medicaid or managed care program needed by the patient, have an available bed and are willing to accept the patient.    Patient/family informed of [facility]' ownership interest in [facility], as well as of the fact that they are under no obligation to receive care at this facility.  PASARR submitted to EDS on 06/21/2013 PASARR number received from EDS on 06/21/2013  FL2 transmitted to all facilities in geographic area requested by pt/family on  06/21/2013 FL2 transmitted to all facilities within larger geographic area on   Patient informed that his/her managed care company has contracts with or will negotiate with  certain facilities, including the following:     Patient/family informed of bed offers received:  06/21/2013 Patient chooses bed at [facility] Physician recommends and patient chooses bed at    Patient to be transferred to [facility] on  06/21/2013 Patient to be transferred to facility by   The following physician request were entered in Epic:   Additional Comments: Patient lives independently at [place] and is going to [facility] on [place] property.

## 2013-06-21 NOTE — Progress Notes (Signed)
[facility] CONSULT PROGRESS NOTE  Assessment/Plan: *Acute cholecystitis: Status post laparoscopic cholecystectomy. Transition to oral antimicrobial therapy, plan to transfer to skilled nursing facility today  Chest pain: - management per cardiology Echo done.    Hypertension -Blood pressure is stable, with systolic blood pressures fluctuate between 118 and 150 today. Would recommend continuing amlodipine therapy.  Probable gout -She reports improvement to pain of left great toe. Recommend continuing colchicine  Procedures:  Ct adb  Antibiotics:  Echo  HPI/Subjective: Patient tolerating by mouth intake. Has remained afebrile, surgery as written discharge orders for transition to skilled nursing facility.  Objective: Filed Vitals:   06/20/13 1646 06/20/13 2040 06/21/13 0500 06/21/13 0900  BP: 122/62 131/78 118/55 150/68  Pulse: 71 77 70 74  Temp: 98.2 F (36.8 C) 98.6 F (37 C) 97.9 F (36.6 C) 98 F (36.7 C)  TempSrc:  Oral Oral Oral  Resp: 19 18 20 18   Height:  5\' 5"  (1.651 m)    Weight:  91.4 kg (201 lb 8 oz)    SpO2: 95% 92% 94% 94%    Intake/Output Summary (Last 24 hours) at 06/21/13 1303 Last data filed at 06/21/13 1250  Gross per 24 hour  Intake   1770 ml  Output    500 ml  Net   1270 ml   Filed Weights   06/17/13 2008 06/19/13 2059 06/20/13 2040  Weight: 89.3 kg (196 lb 13.9 oz) 89.858 kg (198 lb 1.6 oz) 91.4 kg (201 lb 8 oz)    Exam:  General: Alert, awake, oriented x3, in no acute distress.  Heart: Regular rate and rhythm, without murmurs, rubs, gallops.  Lungs: Good air movement, bilateral air movement.  Abdomen: deferred. Extremities: There is localized erythema and pain to palpation over her left great toe Neuro: Grossly intact, nonfocal.   Data Reviewed: Basic Metabolic Panel:  Recent Labs Lab 06/16/13 1958 06/18/13 1030 06/19/13 1649 06/20/13 0707  NA 137 140 140 142  K 4.1 3.8 3.6 4.0  CL 98 104 104 108  CO2 29 26 26  24   GLUCOSE 124* 102* 147* 129*  BUN 31* 27* 26* 31*  CREATININE 0.92 1.41* 1.09 1.05  CALCIUM 10.0 9.1 9.1 9.5   Liver Function Tests:  Recent Labs Lab 06/16/13 1958 06/18/13 1030  AST 23 27  ALT 15 12  ALKPHOS 58 50  BILITOT 0.4 0.5  PROT 6.5 6.0  ALBUMIN 3.4* 2.7*    Recent Labs Lab 06/16/13 1958  LIPASE 29   No results found for this basename: AMMONIA,  in the last 168 hours CBC:  Recent Labs Lab 06/16/13 1958 06/18/13 1030  WBC 9.1 12.0*  HGB 13.2 12.5  HCT 38.3 37.6  MCV 95.8 96.4  PLT 249 219   Cardiac Enzymes:  Recent Labs Lab 06/16/13 1958 06/17/13 0812 06/17/13 1122 06/17/13 1830  TROPONINI <0.30 <0.30 <0.30 <0.30   BNP (last 3 results) No results found for this basename: PROBNP,  in the last 8760 hours CBG: No results found for this basename: GLUCAP,  in the last 168 hours  Recent Results (from the past 240 hour(s))  URINE CULTURE     Status: None   Collection Time    06/16/13 11:18 PM      Result Value Range Status   Specimen Description URINE, CLEAN CATCH   Final   Special Requests NONE   Final   Culture  Setup Time     Final   Value: 06/16/2013 01:02  Performed at [facility] Count     Final   Value: >=100,000 COLONIES/ML     Performed at [facility]   Culture     Final   Value: ESCHERICHIA COLI     Performed at [facility]   Report Status 06/19/2013 FINAL   Final   Organism ID, Bacteria ESCHERICHIA COLI   Final     Studies: No results found.  Scheduled Meds: . amLODipine  2.5 mg Oral q morning - 10a  . ciprofloxacin  500 mg Oral BID  . colchicine  0.6 mg Oral BID  . heparin subcutaneous  5,000 Units Subcutaneous Q8H  . isosorbide mononitrate  60 mg Oral Daily  . pantoprazole  40 mg Oral Daily   Continuous Infusions: . sodium chloride 10 mL/hr (06/21/13 0909)     Jeralyn Bennett  [facility] Pager [phone]. If 8PM-8AM, please contact night-coverage at [email or web address],  password [facility] 06/21/2013, 1:03 PM  LOS: 5 days

## 2013-06-21 NOTE — Discharge Summary (Signed)
Patient ID: [name] MRN: [id number] DOB/AGE: 10-21-1928 79 y.o.  Admit date: 06/16/2013 Discharge date: 06/21/2013  Procedures: lap chole  Consults: cardiology and internal medicine  Reason for Admission: 79 yo female presents with several months of post-prandial "indigestion" and 16 hours of severe RUQ/ epigastric abdominal pain, nausea, vomiting. This has been her worst attack. The pain is becoming more severe. She has had poor PO intake today.  Admission Diagnoses:  1. Acute cholecystitis Patient Active Problem List   Diagnosis Date Noted  . Acute cholecystitis 06/17/2013  . Chest pain 06/17/2013  . Pulmonary infiltrate in left lung on chest x-ray 06/17/2013  . Hypertension   . CAD (coronary artery disease)     Hospital Course: the patient was admitted and placed on IV abx therapy. Medicine was initially consulted for medical clearance; however, due to chest pain, they requested cardiology clear her.  She underwent an ECHO which was ok.  She was then taken to the OR where she underwent a lap chole.  She tolerated this well.  On POD1, she was weak.  PT was consulted.  Her diet was advanced as tolerated.  She was on O2 and this was able to be weaned.  On POD2, she was stable for dc to short term rehab at the [facility] home.  Discharge Diagnoses:  Principal Problem:   Acute cholecystitis Active Problems:   Hypertension   CAD (coronary artery disease)   Chest pain   Pulmonary infiltrate in left lung on chest x-ray s/p lap chole  Discharge Medications:   Medication List         ALPRAZolam 0.5 MG tablet  Commonly known as:  XANAX  Take 0.5 mg by mouth at bedtime as needed. For sleep     amLODipine 2.5 MG tablet  Commonly known as:  NORVASC  Take 2.5 mg by mouth every morning.     aspirin EC 325 MG tablet  Take 325 mg by mouth daily.     cholecalciferol 1000 UNITS tablet  Commonly known as:  VITAMIN D  Take 1,000 Units by mouth every morning.     enalapril 20 MG  tablet  Commonly known as:  VASOTEC  Take 20 mg by mouth daily.     ferrous sulfate 325 (65 FE) MG tablet  Take 325 mg by mouth daily with breakfast.     fish oil-omega-3 fatty acids 1000 MG capsule  Take 2 g by mouth daily after lunch.     hydrochlorothiazide 25 MG tablet  Commonly known as:  HYDRODIURIL  Take 25 mg by mouth every morning.     HYDROcodone-acetaminophen 5-325 MG per tablet  Commonly known as:  NORCO/VICODIN  Take 1-2 tablets by mouth every 4 (four) hours as needed for moderate pain.     isosorbide mononitrate 60 MG 24 hr tablet  Commonly known as:  IMDUR  Take 60 mg by mouth daily.     multivitamin with minerals Tabs tablet  Take 1 tablet by mouth daily after lunch.     pantoprazole 40 MG tablet  Commonly known as:  PROTONIX  Take 40 mg by mouth daily.     SYSTANE OP  Place 1 drop into both eyes 2 (two) times daily.        Discharge Instructions:     Follow-up Information   Follow up with [facility] On 07/10/2013. (3:00pm, arrive at 2:30pm for paperwork)    Contact information:   [address]   [place] [address] [phone]       Signed: Letha Cape 06/21/2013, 11:23 AM

## 2013-06-21 NOTE — Clinical Social Work Psychosocial (Signed)
Clinical Social Work Department BRIEF PSYCHOSOCIAL ASSESSMENT 06/21/2013  Patient:  [name], [name]     Account Number:  [id number]     Admit date:  06/16/2013  Clinical Social Worker:  Delmer Islam  Date/Time:  06/21/2013 06:08 AM  Referred by:  Physician  Date Referred:  06/21/2013 Referred for  SNF Placement   Other Referral:   Interview type:  Patient Other interview type:    PSYCHOSOCIAL DATA Living Status:  ALONE Admitted from facility:   Level of care:   Primary support name:  [name] Primary support relationship to patient:  CHILD, ADULT Degree of support available:   Patient lives alone at [facility] in Independent Living. Patient also has a sister who has been at patient's bedside daily since her admission.    CURRENT CONCERNS Current Concerns  Post-Acute Placement   Other Concerns:    SOCIAL WORK ASSESSMENT / PLAN CSW and nurse case manager [employer] talked with patient at the bedside with sister present regarding discharge planning and the MD's recommendation of ST rehab. Patient lives alone and does not have 24 hour assistance/supervision, but expressed that she really wanted to go home. After more conversation with patient and a call to the MD by the case manager, another visit was made to patient and she agreed to [facility] rehab at [facility].   Assessment/plan status:  No Further Intervention Required Other assessment/ plan:   Information/referral to community resources:    PATIENT'S/FAMILY'S RESPONSE TO PLAN OF CARE: Patient pleasant although initially hesitant about going to [facility] (on [employer] [address]) for rehab.

## 2013-06-21 NOTE — Care Management Note (Signed)
   CARE MANAGEMENT NOTE 06/21/2013  Patient:  [name], [name]   Account Number:  [id number]  Date Initiated:  06/21/2013  Documentation initiated by:  ROYAL,CHERYL  Subjective/Objective Assessment:   Pt from Indep living at [place].     Action/Plan:   Will d/c to short term SNF at [facility] home today for rehab.   Anticipated DC Date:  06/21/2013   Anticipated DC Plan:  SKILLED NURSING FACILITY         Choice offered to / List presented to:             Status of service:  Completed, signed off Medicare Important Message given?   (If response is "NO", the following Medicare IM given date fields will be blank) Date Medicare IM given:   Date Additional Medicare IM given:    Discharge Disposition:  SKILLED NURSING FACILITY  Per UR Regulation:    If discussed at Long Length of Stay Meetings, dates discussed:    Comments:

## 2013-06-21 NOTE — Progress Notes (Signed)
2 Days Post-Op  Subjective: Still with moderate post op discomfort.  Having trouble getting out of bed on her own Remains on oxygen  Objective: Vital signs in last 24 hours: Temp:  [97.9 F (36.6 C)-98.6 F (37 C)] 97.9 F (36.6 C) (11/06 0500) Pulse Rate:  [65-77] 70 (11/06 0500) Resp:  [17-20] 20 (11/06 0500) BP: (114-137)/(54-78) 118/55 mmHg (11/06 0500) SpO2:  [90 %-95 %] 94 % (11/06 0500) Weight:  [201 lb 8 oz (91.4 kg)] 201 lb 8 oz (91.4 kg) (11/05 2040) Last BM Date:  (prior to  admission)  Intake/Output from previous day: 11/05 0701 - 11/06 0700 In: 1980 [P.O.:1380; I.V.:600] Out: 1080 [Urine:1080] Intake/Output this shift:    Lungs clear Abdomen soft, dressings dry, appropriately tender  Lab Results:   Recent Labs  06/18/13 1030  WBC 12.0*  HGB 12.5  HCT 37.6  PLT 219   BMET  Recent Labs  06/19/13 1649 06/20/13 0707  NA 140 142  K 3.6 4.0  CL 104 108  CO2 26 24  GLUCOSE 147* 129*  BUN 26* 31*  CREATININE 1.09 1.05  CALCIUM 9.1 9.5   PT/INR No results found for this basename: LABPROT, INR,  in the last 72 hours ABG No results found for this basename: PHART, PCO2, PO2, HCO3,  in the last 72 hours  Studies/Results: No results found.  Anti-infectives: Anti-infectives   Start     Dose/Rate Route Frequency Ordered Stop   06/20/13 0930  ciprofloxacin (CIPRO) tablet 500 mg     500 mg Oral 2 times daily 06/20/13 0829     06/17/13 0230  piperacillin-tazobactam (ZOSYN) IVPB 3.375 g  Status:  Discontinued     3.375 g 12.5 mL/hr over 240 Minutes Intravenous Every 8 hours 06/17/13 0203 06/20/13 0829   06/16/13 2200  levofloxacin (LEVAQUIN) IVPB 750 mg     750 mg 100 mL/hr over 90 Minutes Intravenous  Once 06/16/13 2146 06/17/13 0032      Assessment/Plan: s/p Procedure(s): LAPAROSCOPIC CHOLECYSTECTOMY (N/A)  Not ready for discharge today Needs further PT and needs to be weened of oxygen  LOS: 5 days    BLACKMAN,DOUGLAS A 06/21/2013

## 2013-06-22 DIAGNOSIS — I10 Essential (primary) hypertension: Secondary | ICD-10-CM | POA: Diagnosis not present

## 2013-06-22 DIAGNOSIS — I2510 Atherosclerotic heart disease of native coronary artery without angina pectoris: Secondary | ICD-10-CM | POA: Diagnosis not present

## 2013-06-22 DIAGNOSIS — R5381 Other malaise: Secondary | ICD-10-CM | POA: Diagnosis not present

## 2013-06-22 NOTE — Progress Notes (Signed)
Pt discharged to SNF. Pt remains stable. No signs and symptoms of distress. Educated to return to ER in the event of SOB, dizziness, chest pain, or fainting. TATJANA HICKS, RN  

## 2013-07-10 ENCOUNTER — Ambulatory Visit (INDEPENDENT_AMBULATORY_CARE_PROVIDER_SITE_OTHER): Payer: Medicare Other | Admitting: General Surgery

## 2013-07-10 ENCOUNTER — Encounter (INDEPENDENT_AMBULATORY_CARE_PROVIDER_SITE_OTHER): Payer: Self-pay

## 2013-07-10 VITALS — BP 144/80 | HR 76 | Temp 97.2°F | Resp 18 | Ht 63.0 in | Wt 196.0 lb

## 2013-07-10 DIAGNOSIS — K8000 Calculus of gallbladder with acute cholecystitis without obstruction: Secondary | ICD-10-CM | POA: Insufficient documentation

## 2013-07-10 NOTE — Patient Instructions (Signed)
May resume normal activities. Follow up as needed. 

## 2013-07-10 NOTE — Progress Notes (Signed)
Subjective: post op check     Patient ID: [name], female   DOB: 05/18/1929, 79 y.o.   MRN: [id number]  HPI [name] is a 79 year old female with a history of CAD, HTN who underwent a laparoscopic cholecystectomy on 06/17/13 with Magnus Ivan.  She has a UTI which was treated with IV atbx, mild acute renal insufficiency treated with IV hydration.  She had a normal post operative hospitalization.  She was discharged to a SNF.  She has been doing  Quite well.  She is going home tomorrow.  She is tolerating a diet.  Denies f/c/s.  Denies n/v.  Denies abdominal pain.    Review of Systems  All other systems reviewed and are negative.       Objective:   Physical Exam  Constitutional: She appears well-developed and well-nourished. No distress.  Abdominal: Soft. Bowel sounds are normal. She exhibits no distension and no mass. There is no tenderness. There is no rebound and no guarding.  Well healed incisions   Skin: She is not diaphoretic.       Assessment:     Acute on chronic cholecystitis S/p lap chole    Plan:     Pathology reviewed with the patient and a copy was provided.  She is doing excellent.  She may resume normal activites.  She may follow up in clinic PRN.

## 2013-07-20 DIAGNOSIS — M76899 Other specified enthesopathies of unspecified lower limb, excluding foot: Secondary | ICD-10-CM | POA: Diagnosis not present

## 2013-08-02 ENCOUNTER — Observation Stay (HOSPITAL_COMMUNITY)
Admission: EM | Admit: 2013-08-02 | Discharge: 2013-08-03 | Disposition: A | Discharge status: Home / Self Care | Payer: Medicare Other | Attending: Internal Medicine | Admitting: Internal Medicine

## 2013-08-02 ENCOUNTER — Emergency Department (HOSPITAL_COMMUNITY): Payer: Medicare Other

## 2013-08-02 ENCOUNTER — Encounter (HOSPITAL_COMMUNITY): Payer: Self-pay | Admitting: Emergency Medicine

## 2013-08-02 DIAGNOSIS — I517 Cardiomegaly: Secondary | ICD-10-CM | POA: Diagnosis not present

## 2013-08-02 DIAGNOSIS — M549 Dorsalgia, unspecified: Secondary | ICD-10-CM | POA: Diagnosis not present

## 2013-08-02 DIAGNOSIS — E785 Hyperlipidemia, unspecified: Secondary | ICD-10-CM | POA: Diagnosis not present

## 2013-08-02 DIAGNOSIS — I959 Hypotension, unspecified: Secondary | ICD-10-CM | POA: Diagnosis not present

## 2013-08-02 DIAGNOSIS — I10 Essential (primary) hypertension: Secondary | ICD-10-CM

## 2013-08-02 DIAGNOSIS — N179 Acute kidney failure, unspecified: Principal | ICD-10-CM | POA: Insufficient documentation

## 2013-08-02 DIAGNOSIS — N390 Urinary tract infection, site not specified: Secondary | ICD-10-CM

## 2013-08-02 DIAGNOSIS — R5381 Other malaise: Secondary | ICD-10-CM | POA: Insufficient documentation

## 2013-08-02 DIAGNOSIS — I2510 Atherosclerotic heart disease of native coronary artery without angina pectoris: Secondary | ICD-10-CM | POA: Insufficient documentation

## 2013-08-02 DIAGNOSIS — R079 Chest pain, unspecified: Secondary | ICD-10-CM

## 2013-08-02 DIAGNOSIS — E860 Dehydration: Secondary | ICD-10-CM

## 2013-08-02 DIAGNOSIS — K810 Acute cholecystitis: Secondary | ICD-10-CM

## 2013-08-02 DIAGNOSIS — R42 Dizziness and giddiness: Secondary | ICD-10-CM | POA: Diagnosis not present

## 2013-08-02 DIAGNOSIS — R918 Other nonspecific abnormal finding of lung field: Secondary | ICD-10-CM

## 2013-08-02 DIAGNOSIS — K8000 Calculus of gallbladder with acute cholecystitis without obstruction: Secondary | ICD-10-CM

## 2013-08-02 HISTORY — DX: Unspecified osteoarthritis, unspecified site: M19.90

## 2013-08-02 LAB — BASIC METABOLIC PANEL
BUN: 44 mg/dL — ABNORMAL HIGH (ref 6–23)
CO2: 24 mEq/L (ref 19–32)
Calcium: 9.7 mg/dL (ref 8.4–10.5)
Chloride: 99 mEq/L (ref 96–112)
Creatinine, Ser: 1.8 mg/dL — ABNORMAL HIGH (ref 0.50–1.10)
GFR calc Af Amer: 29 mL/min — ABNORMAL LOW (ref >90)
GFR calc non Af Amer: 25 mL/min — ABNORMAL LOW (ref >90)
Glucose, Bld: 119 mg/dL — ABNORMAL HIGH (ref 70–99)
Potassium: 4.3 mEq/L (ref 3.5–5.1)
Sodium: 134 mEq/L — ABNORMAL LOW (ref 135–145)

## 2013-08-02 LAB — URINE MICROSCOPIC-ADD ON

## 2013-08-02 LAB — CBC WITH DIFFERENTIAL/PLATELET
Basophils Absolute: 0.1 10*3/uL (ref 0.0–0.1)
Basophils Relative: 1 % (ref 0–1)
Eosinophils Absolute: 0.1 10*3/uL (ref 0.0–0.7)
Eosinophils Relative: 1 % (ref 0–5)
HCT: 41.7 % (ref 36.0–46.0)
Hemoglobin: 14.2 g/dL (ref 12.0–15.0)
Lymphocytes Relative: 37 % (ref 12–46)
Lymphs Abs: 1.5 10*3/uL (ref 0.7–4.0)
MCH: 31.9 pg (ref 26.0–34.0)
MCHC: 34.1 g/dL (ref 30.0–36.0)
MCV: 93.7 fL (ref 78.0–100.0)
Monocytes Absolute: 0.5 10*3/uL (ref 0.1–1.0)
Monocytes Relative: 13 % — ABNORMAL HIGH (ref 3–12)
Neutro Abs: 2 10*3/uL (ref 1.7–7.7)
Neutrophils Relative %: 48 % (ref 43–77)
Platelets: 176 10*3/uL (ref 150–400)
RBC: 4.45 MIL/uL (ref 3.87–5.11)
RDW: 14.7 % (ref 11.5–15.5)
WBC: 4.2 10*3/uL (ref 4.0–10.5)

## 2013-08-02 LAB — URINALYSIS, ROUTINE W REFLEX MICROSCOPIC
Glucose, UA: NEGATIVE mg/dL
Hgb urine dipstick: NEGATIVE
Ketones, ur: 15 mg/dL — AB
Nitrite: NEGATIVE
Protein, ur: 30 mg/dL — AB
Specific Gravity, Urine: 1.024 (ref 1.005–1.030)
Urobilinogen, UA: 0.2 mg/dL (ref 0.0–1.0)
pH: 5 (ref 5.0–8.0)

## 2013-08-02 LAB — POCT I-STAT TROPONIN I: Troponin i, poc: 0 ng/mL (ref 0.00–0.08)

## 2013-08-02 LAB — CG4 I-STAT (LACTIC ACID): Lactic Acid, Venous: 1.44 mmol/L (ref 0.5–2.2)

## 2013-08-02 LAB — CREATININE, SERUM
Creatinine, Ser: 1.67 mg/dL — ABNORMAL HIGH (ref 0.50–1.10)
GFR calc Af Amer: 32 mL/min — ABNORMAL LOW (ref >90)
GFR calc non Af Amer: 27 mL/min — ABNORMAL LOW (ref >90)

## 2013-08-02 LAB — CBC
HCT: 36.7 % (ref 36.0–46.0)
Hemoglobin: 12.4 g/dL (ref 12.0–15.0)
MCH: 31.7 pg (ref 26.0–34.0)
MCHC: 33.8 g/dL (ref 30.0–36.0)
MCV: 93.9 fL (ref 78.0–100.0)
Platelets: 156 10*3/uL (ref 150–400)
RBC: 3.91 MIL/uL (ref 3.87–5.11)
RDW: 14.8 % (ref 11.5–15.5)
WBC: 4 10*3/uL (ref 4.0–10.5)

## 2013-08-02 MED ORDER — ASPIRIN EC 325 MG PO TBEC
325.0000 mg | DELAYED_RELEASE_TABLET | Freq: Every day | ORAL | Status: DC
  Administered 2013-08-02 – 2013-08-03 (×2): 325 mg via ORAL
  Filled 2013-08-02 (×2): qty 1

## 2013-08-02 MED ORDER — DEXTROSE 5 % IV SOLN
1.0000 g | Freq: Once | INTRAVENOUS | Status: AC
  Administered 2013-08-02: 1 g via INTRAVENOUS
  Filled 2013-08-02: qty 10

## 2013-08-02 MED ORDER — ONDANSETRON HCL 4 MG PO TABS: 4.0000 mg | ORAL_TABLET | Freq: Four times a day (QID) | ORAL | Status: DC | PRN

## 2013-08-02 MED ORDER — HEPARIN SODIUM (PORCINE) 5000 UNIT/ML IJ SOLN
5000.0000 [IU] | Freq: Three times a day (TID) | INTRAMUSCULAR | Status: DC
  Administered 2013-08-02 – 2013-08-03 (×2): 5000 [IU] via SUBCUTANEOUS
  Filled 2013-08-02 (×5): qty 1

## 2013-08-02 MED ORDER — ENALAPRIL MALEATE 20 MG PO TABS: 20.0000 mg | ORAL_TABLET | Freq: Every day | ORAL | Status: DC

## 2013-08-02 MED ORDER — ONDANSETRON HCL 4 MG/2ML IJ SOLN: 4.0000 mg | Freq: Four times a day (QID) | INTRAMUSCULAR | Status: DC | PRN

## 2013-08-02 MED ORDER — FERROUS SULFATE 325 (65 FE) MG PO TABS
325.0000 mg | ORAL_TABLET | Freq: Every day | ORAL | Status: DC
  Administered 2013-08-03: 325 mg via ORAL
  Filled 2013-08-02 (×2): qty 1

## 2013-08-02 MED ORDER — PANTOPRAZOLE SODIUM 40 MG PO TBEC
40.0000 mg | DELAYED_RELEASE_TABLET | Freq: Every day | ORAL | Status: DC
  Administered 2013-08-02 – 2013-08-03 (×2): 40 mg via ORAL
  Filled 2013-08-02 (×2): qty 1

## 2013-08-02 MED ORDER — VITAMIN D3 25 MCG (1000 UNIT) PO TABS
1000.0000 [IU] | ORAL_TABLET | Freq: Every morning | ORAL | Status: DC
  Administered 2013-08-03: 1000 [IU] via ORAL
  Filled 2013-08-02: qty 1

## 2013-08-02 MED ORDER — ISOSORBIDE MONONITRATE ER 60 MG PO TB24
60.0000 mg | ORAL_TABLET | Freq: Every day | ORAL | Status: DC
  Administered 2013-08-03: 60 mg via ORAL
  Filled 2013-08-02: qty 1

## 2013-08-02 MED ORDER — SODIUM CHLORIDE 0.9 % IV SOLN
Freq: Once | INTRAVENOUS | Status: AC
  Administered 2013-08-02: 14:00:00 via INTRAVENOUS

## 2013-08-02 MED ORDER — MORPHINE SULFATE 2 MG/ML IJ SOLN
2.0000 mg | INTRAMUSCULAR | Status: DC | PRN
  Administered 2013-08-02: 2 mg via INTRAVENOUS
  Filled 2013-08-02: qty 1

## 2013-08-02 MED ORDER — ADULT MULTIVITAMIN W/MINERALS CH
1.0000 | ORAL_TABLET | Freq: Every day | ORAL | Status: DC
  Administered 2013-08-03: 1 via ORAL
  Filled 2013-08-02: qty 1

## 2013-08-02 MED ORDER — ACETAMINOPHEN 650 MG RE SUPP: 650.0000 mg | Freq: Four times a day (QID) | RECTAL | Status: DC | PRN

## 2013-08-02 MED ORDER — CEFTRIAXONE SODIUM 1 G IJ SOLR
1.0000 g | INTRAMUSCULAR | Status: DC
  Administered 2013-08-03: 1 g via INTRAVENOUS
  Filled 2013-08-02: qty 10

## 2013-08-02 MED ORDER — ACETAMINOPHEN 325 MG PO TABS
650.0000 mg | ORAL_TABLET | Freq: Four times a day (QID) | ORAL | Status: DC | PRN
  Administered 2013-08-02 – 2013-08-03 (×2): 650 mg via ORAL
  Filled 2013-08-02 (×3): qty 2

## 2013-08-02 MED ORDER — ALPRAZOLAM 0.25 MG PO TABS
0.2500 mg | ORAL_TABLET | Freq: Every evening | ORAL | Status: DC | PRN
  Administered 2013-08-02: 0.25 mg via ORAL
  Filled 2013-08-02: qty 1

## 2013-08-02 MED ORDER — AMLODIPINE BESYLATE 2.5 MG PO TABS
2.5000 mg | ORAL_TABLET | Freq: Every morning | ORAL | Status: DC
  Administered 2013-08-03: 2.5 mg via ORAL
  Filled 2013-08-02: qty 1

## 2013-08-02 MED ORDER — SODIUM CHLORIDE 0.9 % IV BOLUS (SEPSIS)
1000.0000 mL | Freq: Once | INTRAVENOUS | Status: AC
  Administered 2013-08-02: 1000 mL via INTRAVENOUS

## 2013-08-02 MED ORDER — SODIUM CHLORIDE 0.9 % IV SOLN
INTRAVENOUS | Status: DC
  Administered 2013-08-02: 75 mL/h via INTRAVENOUS
  Administered 2013-08-03: 04:00:00 via INTRAVENOUS

## 2013-08-02 NOTE — ED Provider Notes (Signed)
CSN: [id number]     Arrival date & time 08/02/13  1210 History   First MD Initiated Contact with Patient 08/02/13 1240     Chief Complaint  Patient presents with  . Back Pain  . Fatigue   Patient is a 79 y.o. female presenting with weakness. The history is provided by the patient.  Weakness This is a new problem. The current episode started more than 2 days ago. The problem occurs daily. The problem has been gradually worsening. Pertinent negatives include no chest pain, no abdominal pain and no shortness of breath. Exacerbated by: exertion. The symptoms are relieved by rest. She has tried rest for the symptoms. The treatment provided mild relief.  pt reports for past week she has had increased fatigue, generalized weakness, mostly with exertion No syncope No CP She does report upper back pain when she exerts herself No vomiting/diarrhea No abd pain She was sent from physician office for low BP   Past Medical History  Diagnosis Date  . Hypertension   . CAD (coronary artery disease)     a. Mod prox LAD & first diagonal stenosis by cath 2002 - managed medically since that time with normal LV function.  . Hyperlipidemia    Past Surgical History  Procedure Laterality Date  . Back surgery    . Abdominal hysterectomy    . Knee surgery    . Hernia repair    . Shoulder surgery    . Carpal tunnel release    . Cholecystectomy N/A 06/19/2013    Procedure: LAPAROSCOPIC CHOLECYSTECTOMY;  Surgeon: Shelly Rubenstein, MD;  Location: [facility] OR;  Service: General;  Laterality: N/A;   Family History  Problem Relation Age of Onset  . Heart disease Mother     "enlarged valve"   History  Substance Use Topics  . Smoking status: Never Smoker   . Smokeless tobacco: Never Used  . Alcohol Use: No   OB History   Grav Para Term Preterm Abortions TAB SAB Ect Mult Living                 Review of Systems  Constitutional: Negative for fever.  Respiratory: Negative for shortness of breath.    Cardiovascular: Negative for chest pain.  Gastrointestinal: Negative for vomiting, abdominal pain, diarrhea and blood in stool.  Neurological: Positive for weakness. Negative for syncope.  All other systems reviewed and are negative.    Allergies  Felodipine; Omeprazole; and Shrimp  Home Medications   Current Outpatient Rx  Name  Route  Sig  Dispense  Refill  . acetaminophen (TYLENOL) 500 MG tablet   Oral   Take 500 mg by mouth every 6 (six) hours as needed for mild pain or headache.         . ALPRAZolam (XANAX) 0.5 MG tablet   Oral   Take 0.25-0.5 mg by mouth at bedtime as needed for sleep. For sleep         . amLODipine (NORVASC) 2.5 MG tablet   Oral   Take 2.5 mg by mouth every morning.         Marland Kitchen aspirin EC 325 MG tablet   Oral   Take 325 mg by mouth daily.         . cholecalciferol (VITAMIN D) 1000 UNITS tablet   Oral   Take 1,000 Units by mouth every morning.         . enalapril (VASOTEC) 20 MG tablet   Oral   Take 20 mg  by mouth daily.         . ferrous sulfate 325 (65 FE) MG tablet   Oral   Take 325 mg by mouth daily with breakfast.         . fish oil-omega-3 fatty acids 1000 MG capsule   Oral   Take 2 g by mouth daily after lunch.         . hydrochlorothiazide (HYDRODIURIL) 25 MG tablet   Oral   Take 25 mg by mouth every morning.         . isosorbide mononitrate (IMDUR) 60 MG 24 hr tablet   Oral   Take 60 mg by mouth daily.         . Multiple Vitamin (MULTIVITAMIN WITH MINERALS) TABS   Oral   Take 1 tablet by mouth daily after lunch.         . pantoprazole (PROTONIX) 40 MG tablet   Oral   Take 40 mg by mouth daily.         Bertram Gala Glycol-Propyl Glycol (SYSTANE OP)   Right Eye   Place 1 drop into the right eye 2 (two) times daily.           BP 106/57  Pulse 81  Temp(Src) 97.7 F (36.5 C) (Oral)  Resp 22  Ht 5\' 5"  (1.651 m)  Wt 180 lb 6.4 oz (81.829 kg)  BMI 30.02 kg/m2  SpO2 100% BP 104/50  Pulse 79   Temp(Src) 97.7 F (36.5 C) (Oral)  Resp 19  Ht 5\' 5"  (1.651 m)  Wt 180 lb 6.4 oz (81.829 kg)  BMI 30.02 kg/m2  SpO2 98%  Physical Exam CONSTITUTIONAL: Well developed/well nourished HEAD: Normocephalic/atraumatic EYES: EOMI/PERRL ENMT: Mucous membranes moist NECK: supple no meningeal signs SPINE:entire spine nontender CV: S1/S2 noted, no murmurs/rubs/gallops noted LUNGS: Lungs are clear to auscultation bilaterally, no apparent distress ABDOMEN: soft, nontender, no rebound or guarding GU:no cva tenderness NEURO: Pt is awake/alert, moves all extremitiesx4 EXTREMITIES: pulses normal, full ROM SKIN: warm, color normal PSYCH: no abnormalities of mood noted  ED Course  Procedures   3:30 PM Pt found to have uti and dehydration She did have drop in her BP (80s) but that responded to IV fluids and I feel she would benefit from admission for monitoring D/w [facility], will admit Pt agreeable and is asymptomatic at rest   Labs Review Labs Reviewed  CBC WITH DIFFERENTIAL - Abnormal; Notable for the following:    Monocytes Relative 13 (*)    All other components within normal limits  BASIC METABOLIC PANEL  URINALYSIS, ROUTINE W REFLEX MICROSCOPIC  CG4 I-STAT (LACTIC ACID)   Imaging Review No results found.  EKG Interpretation    Date/Time:  Thursday August 02 2013 12:24:27 EST Ventricular Rate:  83 PR Interval:  183 QRS Duration: 82 QT Interval:  367 QTC Calculation: 431 R Axis:   135 Text Interpretation:  Sinus rhythm Right ventricular hypertrophy Confirmed by Bebe Shaggy  MD, DONALD [phone]) on 08/02/2013 12:41:45 PM            MDM  No diagnosis found. Nursing notes including past medical history and social history reviewed and considered in documentation xrays reviewed and considered Labs/vital reviewed and considered     Joya Gaskins, MD 08/02/13 1531

## 2013-08-02 NOTE — H&P (Addendum)
[facility] History and Physical  ATHALIA SETTERLUND QMV:[id number] DOB: June 21, 1929 DOA: 08/02/2013  Referring physician: Emergency department PCP: Ginette Otto, MD  Specialists:   Chief Complaint: Hypotension  HPI: [name] is a 79 y.o. female  With a hx of CAD, htn who presented to the PCP with generalized weakness, found to have sbp of 80's. Pt was brought to the ED with continued sbp in the 80's. Found to have UTI in the ED. Hospitalist consulted for admission  On further questioning, pt admits to poor PO intake since elective cholecystectomy that was done several weeks ago (admitted 06/16/13-06/21/13). Denies syncope or near syncope. Does, however report worsened weakness as of 3-4 days ago.  Review of Systems:  Per above, the remainder of the 10pt ros reviewed and are neg  Past Medical History  Diagnosis Date  . Hypertension   . CAD (coronary artery disease)     a. Mod prox LAD & first diagonal stenosis by cath 2002 - managed medically since that time with normal LV function.  . Hyperlipidemia    Past Surgical History  Procedure Laterality Date  . Back surgery    . Abdominal hysterectomy    . Knee surgery    . Hernia repair    . Shoulder surgery    . Carpal tunnel release    . Cholecystectomy N/A 06/19/2013    Procedure: LAPAROSCOPIC CHOLECYSTECTOMY;  Surgeon: Shelly Rubenstein, MD;  Location: [facility] OR;  Service: General;  Laterality: N/A;   Social History:  reports that she has never smoked. She has never used smokeless tobacco. She reports that she does not drink alcohol or use illicit drugs.  where does patient live--home, ALF, SNF? and with whom if at home?  Can patient participate in ADLs?  Allergies  Allergen Reactions  . Felodipine     Reaction: unknown possible nausea or rash  . Omeprazole     Reaction: unknown possible nausea or rash  . Shrimp [Shellfish Allergy] Nausea And Vomiting    Family History  Problem Relation Age of Onset  . Heart  disease Mother     "enlarged valve"    (be sure to complete)  Prior to Admission medications   Medication Sig Start Date End Date Taking? Authorizing Provider  acetaminophen (TYLENOL) 500 MG tablet Take 500 mg by mouth every 6 (six) hours as needed for mild pain or headache.   Yes Historical Provider, MD  ALPRAZolam Prudy Feeler) 0.5 MG tablet Take 0.25-0.5 mg by mouth at bedtime as needed for sleep. For sleep   Yes Historical Provider, MD  amLODipine (NORVASC) 2.5 MG tablet Take 2.5 mg by mouth every morning.   Yes Historical Provider, MD  aspirin EC 325 MG tablet Take 325 mg by mouth daily.   Yes Historical Provider, MD  cholecalciferol (VITAMIN D) 1000 UNITS tablet Take 1,000 Units by mouth every morning.   Yes Historical Provider, MD  enalapril (VASOTEC) 20 MG tablet Take 20 mg by mouth daily.   Yes Historical Provider, MD  ferrous sulfate 325 (65 FE) MG tablet Take 325 mg by mouth daily with breakfast.   Yes Historical Provider, MD  fish oil-omega-3 fatty acids 1000 MG capsule Take 2 g by mouth daily after lunch.   Yes Historical Provider, MD  hydrochlorothiazide (HYDRODIURIL) 25 MG tablet Take 25 mg by mouth every morning.   Yes Historical Provider, MD  isosorbide mononitrate (IMDUR) 60 MG 24 hr tablet Take 60 mg by mouth daily.   Yes Historical Provider,  MD  Multiple Vitamin (MULTIVITAMIN WITH MINERALS) TABS Take 1 tablet by mouth daily after lunch.   Yes Historical Provider, MD  pantoprazole (PROTONIX) 40 MG tablet Take 40 mg by mouth daily.   Yes Historical Provider, MD  Polyethyl Glycol-Propyl Glycol (SYSTANE OP) Place 1 drop into the right eye 2 (two) times daily.    Yes Historical Provider, MD   Physical Exam: Filed Vitals:   08/02/13 1416 08/02/13 1417 08/02/13 1445 08/02/13 1509  BP: 93/48 82/44 90/53  104/50  Pulse: 86 93 74 79  Temp:      TempSrc:      Resp:   16 19  Height:      Weight:      SpO2:   96% 98%     General:  Awake, in nad  Eyes: PERRL B  ENT: Membranes  dry, dentition fair  Neck: trachea midline, neck supple  Cardiovascular: regular, s1, s2  Respiratory: normal resp effort, no wheezing  Abdomen: soft, nondistended  Skin: poor skin turgor, no abnormal skin lesions seen  Musculoskeletal: perfused, no clubbing  Psychiatric: mood/affect normal // no auditory/visual hallucinations  Neurologic: cn2-12 grossly intact, strength/sensation intact  Labs on Admission:  Basic Metabolic Panel:  Recent Labs Lab 08/02/13 1306  NA 134*  K 4.3  CL 99  CO2 24  GLUCOSE 119*  BUN 44*  CREATININE 1.80*  CALCIUM 9.7   Liver Function Tests: No results found for this basename: AST, ALT, ALKPHOS, BILITOT, PROT, ALBUMIN,  in the last 168 hours No results found for this basename: LIPASE, AMYLASE,  in the last 168 hours No results found for this basename: AMMONIA,  in the last 168 hours CBC:  Recent Labs Lab 08/02/13 1306  WBC 4.2  NEUTROABS 2.0  HGB 14.2  HCT 41.7  MCV 93.7  PLT 176   Cardiac Enzymes: No results found for this basename: CKTOTAL, CKMB, CKMBINDEX, TROPONINI,  in the last 168 hours  BNP (last 3 results) No results found for this basename: PROBNP,  in the last 8760 hours CBG: No results found for this basename: GLUCAP,  in the last 168 hours  Radiological Exams on Admission: Dg Chest Portable 1 View  08/02/2013   CLINICAL DATA:  Weakness.  EXAM: PORTABLE CHEST - 1 VIEW  COMPARISON:  06/16/2013.  FINDINGS: Cardiopericardial silhouette within normal limits. Mediastinal contours normal. Trachea midline. No airspace disease or effusion. Monitoring leads project over the chest.  IMPRESSION: No active cardiopulmonary disease.   Electronically Signed   By: Andreas Newport M.D.   On: 08/02/2013 13:32    Assessment/Plan Principal Problem:   Acute renal failure Active Problems:   Hypertension   CAD (coronary artery disease)   Hypotension   UTI (lower urinary tract infection)   ARF (acute renal failure)  1. Acute renal  failure 1. GFR from 48 -> 25 2. Cont with IVF 3. Follow renal fx closely 4. Hold diuretic and ACEI 5. See below 2. Hypotension 1. Likely secondary to poor PO intake, dehydration s/p recent elective surgery 2. Current SBP on monitor is over 100 3. Will hold diuretics but cont only on Ca channel blocker for now 4. Cont on IVF hydration 5. Admit to med/surg, inpt, given ARF 3. HTN 1. Would hold diuretics per above 4. CAD 1. Stable 2. Asymptomtic 5. UTI 1. Urine cx pending 2. Afebrile 3. Cont on empiric rocephin 6. DVT prophylaxis 1. Heparin subQ 7. Malnutrition 1. Reports poor po intake for the past several weeks since her  elective cholecystectomy 2. Will consult Nutrition  Code Status: DNR - confirmed with patient and son (must indicate code status--if unknown or must be presumed, indicate so) Family Communication: Pt and son in room (indicate person spoken with, if applicable, with phone number if by telephone) Disposition Plan: Pending (indicate anticipated LOS)  Time spent:  CHIU, Scheryl Marten [facility] Pager [phone]  If 7PM-7AM, please contact night-coverage [email or web address] Password [email or web address] 08/02/2013, 4:12 PM

## 2013-08-02 NOTE — ED Notes (Signed)
Reports being sent here from dr office due to bp of 88/48 and pt only reports fatigue and pain between her shoulder blades on exertion. Denies sob or cp. Airway intact.

## 2013-08-03 DIAGNOSIS — E860 Dehydration: Secondary | ICD-10-CM | POA: Diagnosis not present

## 2013-08-03 DIAGNOSIS — I2510 Atherosclerotic heart disease of native coronary artery without angina pectoris: Secondary | ICD-10-CM | POA: Diagnosis not present

## 2013-08-03 DIAGNOSIS — N390 Urinary tract infection, site not specified: Secondary | ICD-10-CM | POA: Diagnosis not present

## 2013-08-03 DIAGNOSIS — N179 Acute kidney failure, unspecified: Principal | ICD-10-CM

## 2013-08-03 LAB — CBC
HCT: 38.3 % (ref 36.0–46.0)
Hemoglobin: 12.8 g/dL (ref 12.0–15.0)
MCH: 31.4 pg (ref 26.0–34.0)
MCHC: 33.4 g/dL (ref 30.0–36.0)
MCV: 94.1 fL (ref 78.0–100.0)
Platelets: 157 K/uL (ref 150–400)
RBC: 4.07 MIL/uL (ref 3.87–5.11)
RDW: 14.7 % (ref 11.5–15.5)
WBC: 3.8 K/uL — ABNORMAL LOW (ref 4.0–10.5)

## 2013-08-03 LAB — COMPREHENSIVE METABOLIC PANEL WITH GFR
ALT: 22 U/L (ref 0–35)
AST: 33 U/L (ref 0–37)
Albumin: 3 g/dL — ABNORMAL LOW (ref 3.5–5.2)
Alkaline Phosphatase: 53 U/L (ref 39–117)
BUN: 40 mg/dL — ABNORMAL HIGH (ref 6–23)
CO2: 22 meq/L (ref 19–32)
Calcium: 8.9 mg/dL (ref 8.4–10.5)
Chloride: 105 meq/L (ref 96–112)
Creatinine, Ser: 1.32 mg/dL — ABNORMAL HIGH (ref 0.50–1.10)
GFR calc Af Amer: 42 mL/min — ABNORMAL LOW (ref >90)
GFR calc non Af Amer: 36 mL/min — ABNORMAL LOW (ref >90)
Glucose, Bld: 71 mg/dL (ref 70–99)
Potassium: 4.3 meq/L (ref 3.5–5.1)
Sodium: 136 meq/L (ref 135–145)
Total Bilirubin: 0.2 mg/dL — ABNORMAL LOW (ref 0.3–1.2)
Total Protein: 5.7 g/dL — ABNORMAL LOW (ref 6.0–8.3)

## 2013-08-03 MED ORDER — CEFUROXIME AXETIL 500 MG PO TABS: 500.0000 mg | ORAL_TABLET | Freq: Two times a day (BID) | ORAL | Status: DC

## 2013-08-03 MED ORDER — BOOST / RESOURCE BREEZE PO LIQD: 1.0000 | ORAL | Status: DC

## 2013-08-03 NOTE — Progress Notes (Signed)
OT Cancellation Note  Patient Details Name: LELAH RENNAKER MRN: [id number] DOB: Sep 20, 1928   Cancelled Treatment:    Reason Eval/Treat Not Completed: OT screened, no needs identified, will sign off.  Pt has discharge order written and is getting ready to return to independent living at [facility].  Pt has high commode and walk in shower.  She feels near baseline for adls.  Will sign off.   SPENCER,MARYELLEN 08/03/2013, 2:09 PM Marica Otter, OTR/L [phone] 08/03/2013

## 2013-08-03 NOTE — Discharge Summary (Signed)
Physician Discharge Summary  TEELA NARDUCCI WUJ:[id number] DOB: [birth year] DOA: 08/02/2013  PCP: Ginette Otto, MD  Admit date: 08/02/2013 Discharge date: 08/03/2013  Time spent: 30 minutes  Recommendations for Outpatient Follow-up:  Patient should have her primary care physician within one week of discharge. She should have her urine culture also followed. Patient be discharged with an additional 2 days of antibiotics for her urinary tract infection. She should continue taking her medications as prescribed. So her medications for blood pressure were held due to low blood pressures as well as acute kidney injury. Will be starting the medication should be discussed with her primary care physician.  Patient was offered home health services however refused. Patient should continue physical therapy as an outpatient.   Discharge Diagnoses:  Principal Problem:   Acute renal failure likely secondary to dehydration versus medications Active Problems:   Hypertension   CAD (coronary artery disease)   Hypotension   UTI (lower urinary tract infection)  Discharge Condition: Stable  Diet recommendation: Heart healthy  Filed Weights   08/02/13 1225 08/02/13 1630 08/02/13 2131  Weight: 81.829 kg (180 lb 6.4 oz) 82.419 kg (181 lb 11.2 oz) 83.416 kg (183 lb 14.4 oz)    History of present illness:  [name] is a 79 y.o. female With a hx of CAD, htn who presented to the PCP with generalized weakness, found to have sbp of 80's. Pt was brought to the ED with continued sbp in the 80's. Found to have UTI in the ED. Hospitalist consulted for admission  On further questioning, pt admits to poor PO intake since elective cholecystectomy that was done several weeks ago (admitted 06/16/13-06/21/13). Denies syncope or near syncope. Does, however report worsened weakness as of 3-4 days ago.  Hospital Course:  This is an 79 year old female history of coronary artery disease, hypertension, recent  urinary tract infection in November 2014, who recently underwent elective cholecystectomy. Patient admits to having poor oral intake and not having a good appetite. However while in the hospital patient's appetite has returned. She was admitted for acute kidney injury with a creatinine of 1.8, which was likely secondary to poor oral intake as well as dehydration. She was resuscitated with IV fluids. Her creatinine has trended downward to 1.3. Nutrition was also consulted for her nutrition status. Patient was found to have a urinary tract infection, for which she was placed on IV ceftriaxone. In November 2014 her urine culture did show Escherichia coli which was fairly pansensitive. Her urine culture will need to be followed. She will be discharged on Ceftin for remainder of a 3 day course of antibiotics. Patient was mildly dehydrated on admission, she also had some hypotension. Some of her home medications including enalapril as well as hydrochlorothiazide were held secondary to her hypotension as well as acute kidney injury. Patient should speak with her primary care physician regarding restarting these medications.  Patient was also found to have some weakness. Physical therapy was consulted and did recommend home health however patient refused. Patient should seek outpatient physical therapy. This was discussed with the patient. She does understand and agree to the above. Patient is to follow with her primary care physician in one week of discharge. She should continue taking her medications as prescribed.  Procedures: None  Consultations: Nutrition  Discharge Exam: Filed Vitals:   08/03/13 0946  BP: 125/75  Pulse: 74  Temp: 97.9 F (36.6 C)  Resp: 18     General: Well developed, well nourished, NAD, appears  stated age  HEENT: NCAT, PERRLA, EOMI, Anicteic Sclera, mucous membranes moist. No pharyngeal erythema or exudates  Neck: Supple, no JVD, no masses  Cardiovascular: S1 S2 auscultated,  no rubs, murmurs or gallops. Regular rate and rhythm.  Respiratory: Clear to auscultation bilaterally with equal chest rise  Abdomen: Soft, nontender, nondistended, + bowel sounds  Extremities: warm dry without cyanosis clubbing or edema  Neuro: AAOx3, cranial nerves grossly intact. Strength 5/5 in patient's upper and lower extremities bilaterally  Skin: Without rashes exudates or nodules  Psych: Normal affect and demeanor with intact judgement and insight  Discharge Instructions  Discharge Orders   Future Orders Complete By Expires   Diet - low sodium heart healthy  As directed    Discharge instructions  As directed    Comments:     Patient should have her primary care physician within one week of discharge. She should have her urine culture also followed. Patient be discharged with an additional 2 days of antibiotics for her urinary tract infection. She should continue taking her medications as prescribed. So her medications for blood pressure were held due to low blood pressures as well as acute kidney injury. Will be starting the medication should be discussed with her primary care physician.   Increase activity slowly  As directed        Medication List    STOP taking these medications       enalapril 20 MG tablet  Commonly known as:  VASOTEC     hydrochlorothiazide 25 MG tablet  Commonly known as:  HYDRODIURIL      TAKE these medications       acetaminophen 500 MG tablet  Commonly known as:  TYLENOL  Take 500 mg by mouth every 6 (six) hours as needed for mild pain or headache.     ALPRAZolam 0.5 MG tablet  Commonly known as:  XANAX  Take 0.25-0.5 mg by mouth at bedtime as needed for sleep. For sleep     amLODipine 2.5 MG tablet  Commonly known as:  NORVASC  Take 2.5 mg by mouth every morning.     aspirin EC 325 MG tablet  Take 325 mg by mouth daily.     cefUROXime 500 MG tablet  Commonly known as:  CEFTIN  Take 1 tablet (500 mg total) by mouth 2 (two)  times daily with a meal.     cholecalciferol 1000 UNITS tablet  Commonly known as:  VITAMIN D  Take 1,000 Units by mouth every morning.     ferrous sulfate 325 (65 FE) MG tablet  Take 325 mg by mouth daily with breakfast.     fish oil-omega-3 fatty acids 1000 MG capsule  Take 2 g by mouth daily after lunch.     isosorbide mononitrate 60 MG 24 hr tablet  Commonly known as:  IMDUR  Take 60 mg by mouth daily.     multivitamin with minerals Tabs tablet  Take 1 tablet by mouth daily after lunch.     pantoprazole 40 MG tablet  Commonly known as:  PROTONIX  Take 40 mg by mouth daily.     SYSTANE OP  Place 1 drop into the right eye 2 (two) times daily.       Allergies  Allergen Reactions  . Felodipine     Reaction: unknown possible nausea or rash  . Omeprazole     Reaction: unknown possible nausea or rash  . Shrimp [Shellfish Allergy] Nausea And Vomiting  Follow-up Information   Follow up with Ginette Otto, MD. Schedule an appointment as soon as possible for a visit in 1 week.   Specialty:  Internal Medicine   Contact information:   [address] [place] [address] [phone]        The results of significant diagnostics from this hospitalization (including imaging, microbiology, ancillary and laboratory) are listed below for reference.    Significant Diagnostic Studies: Dg Chest Portable 1 View  08/02/2013   CLINICAL DATA:  Weakness.  EXAM: PORTABLE CHEST - 1 VIEW  COMPARISON:  06/16/2013.  FINDINGS: Cardiopericardial silhouette within normal limits. Mediastinal contours normal. Trachea midline. No airspace disease or effusion. Monitoring leads project over the chest.  IMPRESSION: No active cardiopulmonary disease.   Electronically Signed   By: Andreas Newport M.D.   On: 08/02/2013 13:32    Microbiology: No results found for this or any previous visit (from the past 240 hour(s)).   Labs: Basic Metabolic Panel:  Recent Labs Lab  08/02/13 1306 08/02/13 1607 08/03/13 0550  NA 134*  --  136  K 4.3  --  4.3  CL 99  --  105  CO2 24  --  22  GLUCOSE 119*  --  71  BUN 44*  --  40*  CREATININE 1.80* 1.67* 1.32*  CALCIUM 9.7  --  8.9   Liver Function Tests:  Recent Labs Lab 08/03/13 0550  AST 33  ALT 22  ALKPHOS 53  BILITOT 0.2*  PROT 5.7*  ALBUMIN 3.0*   No results found for this basename: LIPASE, AMYLASE,  in the last 168 hours No results found for this basename: AMMONIA,  in the last 168 hours CBC:  Recent Labs Lab 08/02/13 1306 08/02/13 1607 08/03/13 0550  WBC 4.2 4.0 3.8*  NEUTROABS 2.0  --   --   HGB 14.2 12.4 12.8  HCT 41.7 36.7 38.3  MCV 93.7 93.9 94.1  PLT 176 156 157   Cardiac Enzymes: No results found for this basename: CKTOTAL, CKMB, CKMBINDEX, TROPONINI,  in the last 168 hours BNP: BNP (last 3 results) No results found for this basename: PROBNP,  in the last 8760 hours CBG: No results found for this basename: GLUCAP,  in the last 168 hours     Signed:  Edsel Petrin  [facility] 08/03/2013, 10:11 AM

## 2013-08-03 NOTE — Evaluation (Signed)
Physical Therapy Evaluation Patient Details Name: [name] MRN: [id number] DOB: 1929-02-07 Today's Date: 08/03/2013 Time: 4098-1191 PT Time Calculation (min): 17 min  PT Assessment / Plan / Recommendation History of Present Illness  Pt is a 79 y.o. female adm from [facility] independent living due to decreased systolic BP and generalized LE weakness. Pt was recently hospitalized here at [facility] due to cholecystectomy.  Clinical Impression  Pt adm due to the above. Presents with limitations in functional mobility due to generalized weakness and mild balance deficits. Pt to benefit from skilled PT in acute setting to address deficits listed below (see PT problem list) in order to return home, where she lives alone at [place]. Pt is adamantly refusing any post acute rehab secondary to finances. Recommend pt ambulate with RW upon D/C to reduce risk of falls, pt is agreeable to this. Will cont to follow per POC.     PT Assessment  Patient needs continued PT services    Follow Up Recommendations  Other (comment);Outpatient PT;Supervision - Intermittent (pt would benefit from HHPT but refusing )    Does the patient have the potential to tolerate intense rehabilitation      Barriers to Discharge Decreased caregiver support pt lives alone at independent living     Equipment Recommendations       Recommendations for Other Services OT consult   Frequency Min 3X/week    Precautions / Restrictions Precautions Precautions: Fall Precaution Comments: denies any recent falls  Restrictions Weight Bearing Restrictions: No   Pertinent Vitals/Pain No c/o pain.       Mobility  Bed Mobility Bed Mobility: Supine to Sit;Sitting - Scoot to Edge of Bed Supine to Sit: 5: Supervision;HOB elevated;With rails Sitting - Scoot to Edge of Bed: 6: Modified independent (Device/Increase time);With rail Details for Bed Mobility Assistance: supervision for safety and min cues for sequencing; pt relies on  handrails  Transfers Transfers: Sit to Stand;Stand to Sit Sit to Stand: 4: Min guard;From bed Stand to Sit: 4: Min guard;To chair/3-in-1;With armrests Details for Transfer Assistance: min guard to steady and for balance; pt slightly unsteady; cues for safety  Ambulation/Gait Ambulation/Gait Assistance: 4: Min guard Ambulation Distance (Feet): 25 Feet Assistive device: 1 person hand held assist Ambulation/Gait Assistance Details: pt very guarded with gt; slightly unsteady with direction changes; cues for safety; tends to reach for external objects to brace; would benefit from using RW  Gait Pattern: Step-through pattern;Decreased stride length;Wide base of support Gait velocity: decreased; guarded gt Stairs: No Wheelchair Mobility Wheelchair Mobility: No         PT Diagnosis: Abnormality of gait;Generalized weakness  PT Problem List: Decreased strength;Decreased activity tolerance;Decreased balance;Decreased mobility;Decreased safety awareness PT Treatment Interventions: DME instruction;Gait training;Functional mobility training;Therapeutic activities;Therapeutic exercise;Balance training;Neuromuscular re-education;Patient/family education     PT Goals(Current goals can be found in the care plan section) Acute Rehab PT Goals Patient Stated Goal: to go home and not do rehab PT Goal Formulation: With patient Time For Goal Achievement: 08/17/13 Potential to Achieve Goals: Good  Visit Information  Last PT Received On: 08/03/13 Assistance Needed: +1 History of Present Illness: Pt is a 79 y.o. female adm from [facility] independent living due to decreased systolic BP and generalized LE weakness. Pt was recently hospitalized here at [facility] due to cholecystectomy.       Prior Functioning  Home Living Family/patient expects to be discharged to:: Private residence Living Arrangements: Alone Available Help at Discharge: Family;Available PRN/intermittently Type of Home: Apartment Home  Access: Level  entry Home Layout: One level Home Equipment: Walker - 2 wheels;Cane - single point Additional Comments: independent living at [facility]  Prior Function Level of Independence: Independent with assistive device(s) Comments: pt reports she had began to ambulate with SPC; was able to drive; responsible for fixing her breakfast and lunch; would drive to dining room for dinner  Communication Communication: No difficulties Dominant Hand: Right    Cognition  Cognition Arousal/Alertness: Awake/alert Behavior During Therapy: WFL for tasks assessed/performed Overall Cognitive Status: Within Functional Limits for tasks assessed    Extremity/Trunk Assessment Upper Extremity Assessment Upper Extremity Assessment: Defer to OT evaluation Lower Extremity Assessment Lower Extremity Assessment: LLE deficits/detail LLE Deficits / Details: grossly 3+/5  Cervical / Trunk Assessment Cervical / Trunk Assessment: Normal   Balance Balance Balance Assessed: Yes Static Sitting Balance Static Sitting - Balance Support: Feet supported;No upper extremity supported Static Sitting - Level of Assistance: 7: Independent Static Sitting - Comment/# of Minutes: tolerated sitting EOB ~ 5 min Static Standing Balance Static Standing - Balance Support: Right upper extremity supported;During functional activity Static Standing - Level of Assistance: Other (comment) (min guard )  End of Session PT - End of Session Equipment Utilized During Treatment: Gait belt Activity Tolerance: Patient tolerated treatment well Patient left: in chair;with call bell/phone within reach Nurse Communication: Mobility status  GP Functional Assessment Tool Used: clinical judgement  Functional Limitation: Mobility: Walking and moving around Mobility: Walking and Moving Around Current Status ([id number]): At least 1 percent but less than 20 percent impaired, limited or restricted Mobility: Walking and Moving Around Goal Status  [phone]): 0 percent impaired, limited or restricted   Donell Sievert, [place] [phone] 08/03/2013, 9:14 AM

## 2013-08-03 NOTE — Progress Notes (Signed)
INITIAL NUTRITION ASSESSMENT  DOCUMENTATION CODES Per approved criteria  -Severe malnutrition in the context of acute illness or injury -Obesity Unspecified   INTERVENTION: Discussed importance of nutrition for healing and during acute illness. Add Resource Breeze po daily, each supplement provides 250 kcal and 9 grams of protein. RD to continue to follow nutrition care plan.  NUTRITION DIAGNOSIS: Inadequate oral intake related to poor appetite as evidenced by 8.5% wt loss x 1 month and poor oral intake.   Goal: Intake to meet >90% of estimated nutrition needs.  Monitor:  weight trends, lab trends, I/O's, PO intake, supplement tolerance  Reason for Assessment: MD Consult for Assessment  79 y.o. female  Admitting Dx: Acute renal failure  ASSESSMENT: PMHx significant for CAD, HLD, and HTN. Admitted with generalized weakness. Work-up reveals UTI.  Pt reports to poor PO intake since elective cholecystectomy that was done several weeks ago (admitted 11/1-11/6).  Pt confirms that her oral intake has been poor since November, however she ate her dinner last night and breakfast this morning very well. She states that her appetite has improved greatly and she actually feels hungry.   She notes that after her November hospitalization, she had no appetite and was only taking 2-3 bites at meal times. She thought about taking Ensure but thinks that she wouldn't like it. Agreeable to trying Breeze while here.  Pt meets criteria for severe MALNUTRITION in the context of acute illness as evidenced by 8.5% wt loss x 1 month and intake of <50% x at least 5 days.   Height: Ht Readings from Last 1 Encounters:  08/02/13 5\' 4"  (1.626 m)    Weight: Wt Readings from Last 1 Encounters:  08/02/13 183 lb 14.4 oz (83.416 kg)    Ideal Body Weight: 120 lb  % Ideal Body Weight: 153%  Wt Readings from Last 10 Encounters:  08/02/13 183 lb 14.4 oz (83.416 kg)  07/10/13 196 lb (88.905 kg)   06/20/13 201 lb 8 oz (91.4 kg)  06/20/13 201 lb 8 oz (91.4 kg)    Usual Body Weight: 200 lb  % Usual Body Weight: 91.5%  BMI:  Body mass index is 31.55 kg/(m^2). Obese Class I  Estimated Nutritional Needs: Kcal: 1600 - 1700 Protein: 70 - 85 g Fluid: 1.8 - 2 liters  Skin: intact  Diet Order: Cardiac  EDUCATION NEEDS: -No education needs identified at this time   Intake/Output Summary (Last 24 hours) at 08/03/13 0936 Last data filed at 08/03/13 0600  Gross per 24 hour  Intake 1808.75 ml  Output   1000 ml  Net 808.75 ml    Last BM: 12/18  Labs:   Recent Labs Lab 08/02/13 1306 08/02/13 1607 08/03/13 0550  NA 134*  --  136  K 4.3  --  4.3  CL 99  --  105  CO2 24  --  22  BUN 44*  --  40*  CREATININE 1.80* 1.67* 1.32*  CALCIUM 9.7  --  8.9  GLUCOSE 119*  --  71    CBG (last 3)  No results found for this basename: GLUCAP,  in the last 72 hours  Lipid Panel     Component Value Date/Time   CHOL 199 06/18/2013 0614   TRIG 70 06/18/2013 0614   HDL 67 06/18/2013 0614   CHOLHDL 3.0 06/18/2013 0614   VLDL 14 06/18/2013 0614   LDLCALC 118* 06/18/2013 0614     Scheduled Meds: . amLODipine  2.5 mg Oral q morning -  10a  . aspirin EC  325 mg Oral Daily  . cefTRIAXone (ROCEPHIN)  IV  1 g Intravenous Q24H  . cholecalciferol  1,000 Units Oral q morning - 10a  . ferrous sulfate  325 mg Oral Q breakfast  . heparin  5,000 Units Subcutaneous Q8H  . isosorbide mononitrate  60 mg Oral Daily  . multivitamin with minerals  1 tablet Oral QPC lunch  . pantoprazole  40 mg Oral Daily    Continuous Infusions: . sodium chloride 75 mL/hr at 08/03/13 0343    Past Medical History  Diagnosis Date  . Hypertension   . CAD (coronary artery disease)     a. Mod prox LAD & first diagonal stenosis by cath 2002 - managed medically since that time with normal LV function.  . Hyperlipidemia   . Arthritis     Past Surgical History  Procedure Laterality Date  . Back surgery    .  Abdominal hysterectomy    . Knee surgery    . Hernia repair    . Shoulder surgery    . Carpal tunnel release    . Cholecystectomy N/A 06/19/2013    Procedure: LAPAROSCOPIC CHOLECYSTECTOMY;  Surgeon: Shelly Rubenstein, MD;  Location: [facility] OR;  Service: General;  Laterality: N/A;  . Splenectomy, total      Jarold Motto MS, [place], [place] Pager: [phone] After-hours pager: [phone]

## 2013-08-03 NOTE — Progress Notes (Signed)
CSW received call from [facility] that pt is discharging today and wishes to return to independent living home at [facility]. CSW called facility and they are aware.   Maryclare Labrador, MSW, [facility] Clinical Social Worker [phone]

## 2013-08-04 LAB — URINE CULTURE: Colony Count: >=100000

## 2013-08-14 DIAGNOSIS — Z09 Encounter for follow-up examination after completed treatment for conditions other than malignant neoplasm: Secondary | ICD-10-CM | POA: Diagnosis not present

## 2013-08-14 DIAGNOSIS — N179 Acute kidney failure, unspecified: Secondary | ICD-10-CM | POA: Diagnosis not present

## 2013-08-14 DIAGNOSIS — I959 Hypotension, unspecified: Secondary | ICD-10-CM | POA: Diagnosis not present

## 2013-08-14 DIAGNOSIS — N390 Urinary tract infection, site not specified: Secondary | ICD-10-CM | POA: Diagnosis not present

## 2013-09-17 DIAGNOSIS — M76899 Other specified enthesopathies of unspecified lower limb, excluding foot: Secondary | ICD-10-CM | POA: Diagnosis not present

## 2013-10-04 ENCOUNTER — Ambulatory Visit
Admission: RE | Admit: 2013-10-04 | Discharge: 2013-10-04 | Disposition: A | Discharge status: Home / Self Care | Payer: Medicare Other | Source: Ambulatory Visit | Attending: Internal Medicine | Admitting: Internal Medicine

## 2013-10-04 ENCOUNTER — Other Ambulatory Visit: Payer: Self-pay | Admitting: Internal Medicine

## 2013-10-04 DIAGNOSIS — R609 Edema, unspecified: Secondary | ICD-10-CM | POA: Diagnosis not present

## 2013-10-04 DIAGNOSIS — I10 Essential (primary) hypertension: Secondary | ICD-10-CM | POA: Diagnosis not present

## 2013-10-04 DIAGNOSIS — M7989 Other specified soft tissue disorders: Secondary | ICD-10-CM | POA: Diagnosis not present

## 2013-10-04 DIAGNOSIS — R52 Pain, unspecified: Secondary | ICD-10-CM

## 2013-10-04 DIAGNOSIS — M79609 Pain in unspecified limb: Secondary | ICD-10-CM | POA: Diagnosis not present

## 2013-11-01 ENCOUNTER — Ambulatory Visit (INDEPENDENT_AMBULATORY_CARE_PROVIDER_SITE_OTHER): Payer: Medicare Other | Admitting: Family Medicine

## 2013-11-01 VITALS — BP 126/60 | HR 74 | Temp 97.9°F | Resp 18 | Ht 62.5 in | Wt 185.0 lb

## 2013-11-01 DIAGNOSIS — M109 Gout, unspecified: Secondary | ICD-10-CM | POA: Diagnosis not present

## 2013-11-01 MED ORDER — COLCHICINE 0.6 MG PO TABS
ORAL_TABLET | ORAL | Status: DC
Start: 2013-11-01 — End: 2014-08-17

## 2013-11-01 NOTE — Patient Instructions (Signed)
Use the colchine as directed for your gout attack.  Let me know if you are not feeling better in the next couple of days- Sooner if worse.

## 2013-11-01 NOTE — Progress Notes (Signed)
[facility] [address] [place] [address] [phone]  Date:  11/01/2013   Name:  [name]   DOB:  [birth year]   MRN:  [id number]  PCP:  Mathews Argyle, MD    Chief Complaint: Gout   History of Present Illness:  [name] is a 80 y.o. very pleasant female patient who presents with the following:  She is here today with probably gout in her right great toe.  She has had gout in her left great toe in the past.   No known injury to her toe, this seems like when she had out in the past.  She has not tried anything for it so far.  Otherwise she is well today.   No other sx, no fever.   Per most recent creat her clearance is just over 30 History of renal failure, CAD.  Patient Active Problem List   Diagnosis Date Noted  . Hypotension 08/02/2013  . UTI (lower urinary tract infection) 08/02/2013  . Acute renal failure 08/02/2013  . ARF (acute renal failure) 08/02/2013  . Calculus of gallbladder with acute cholecystitis, without mention of obstruction 07/10/2013  . Acute cholecystitis 06/17/2013  . Chest pain 06/17/2013  . Pulmonary infiltrate in left lung on chest x-ray 06/17/2013  . Hypertension   . CAD (coronary artery disease)     Past Medical History  Diagnosis Date  . Hypertension   . CAD (coronary artery disease)     a. Mod prox LAD & first diagonal stenosis by cath 2002 - managed medically since that time with normal LV function.  . Hyperlipidemia   . Arthritis   . Cataract     Past Surgical History  Procedure Laterality Date  . Back surgery    . Abdominal hysterectomy    . Knee surgery    . Hernia repair    . Shoulder surgery    . Carpal tunnel release    . Cholecystectomy N/A 06/19/2013    Procedure: LAPAROSCOPIC CHOLECYSTECTOMY;  Surgeon: Harl Bowie, MD;  Location: [facility];  Service: General;  Laterality: N/A;  . Splenectomy, total    . Appendectomy      History  Substance Use Topics  . Smoking status:  Never Smoker   . Smokeless tobacco: Never Used  . Alcohol Use: No    Family History  Problem Relation Age of Onset  . Heart disease Mother     "enlarged valve"  . Stroke Father   . Cancer Sister     Allergies  Allergen Reactions  . Felodipine     Reaction: unknown possible nausea or rash  . Omeprazole     Reaction: unknown possible nausea or rash  . Shrimp [Shellfish Allergy] Nausea And Vomiting    Medication list has been reviewed and updated.  Current Outpatient Prescriptions on File Prior to Visit  Medication Sig Dispense Refill  . acetaminophen (TYLENOL) 500 MG tablet Take 500 mg by mouth every 6 (six) hours as needed for mild pain or headache.      Marland Kitchen amLODipine (NORVASC) 2.5 MG tablet Take 2.5 mg by mouth every morning.      Marland Kitchen aspirin EC 325 MG tablet Take 325 mg by mouth daily.      . ferrous sulfate 325 (65 FE) MG tablet Take 325 mg by mouth daily with breakfast.      . fish oil-omega-3 fatty acids 1000 MG capsule Take 2 g by mouth daily after lunch.      Marland Kitchen  isosorbide mononitrate (IMDUR) 60 MG 24 hr tablet Take 60 mg by mouth daily.      . Multiple Vitamin (MULTIVITAMIN WITH MINERALS) TABS Take 1 tablet by mouth daily after lunch.      . ALPRAZolam (XANAX) 0.5 MG tablet Take 0.25-0.5 mg by mouth at bedtime as needed for sleep. For sleep      . cefUROXime (CEFTIN) 500 MG tablet Take 1 tablet (500 mg total) by mouth 2 (two) times daily with a meal.  4 tablet  0  . cholecalciferol (VITAMIN D) 1000 UNITS tablet Take 1,000 Units by mouth every morning.      . feeding supplement, RESOURCE BREEZE, (RESOURCE BREEZE) LIQD Take 1 Container by mouth daily.    0  . pantoprazole (PROTONIX) 40 MG tablet Take 40 mg by mouth daily.      Vladimir Faster Glycol-Propyl Glycol (SYSTANE OP) Place 1 drop into the right eye 2 (two) times daily.        No current facility-administered medications on file prior to visit.    Review of Systems:  As per HPI- otherwise negative.   Physical  Examination: Filed Vitals:   11/01/13 1322  BP: 126/60  Pulse: 74  Temp: 97.9 F (36.6 C)  Resp: 18   Filed Vitals:   11/01/13 1322  Height: 5' 2.5" (1.588 m)  Weight: 185 lb (83.915 kg)   Body mass index is 33.28 kg/(m^2). Ideal Body Weight: Weight in (lb) to have BMI = 25: 138.6  GEN: WDWN, NAD, Non-toxic, A & O x 3, obese, looks well HEENT: Atraumatic, Normocephalic. Neck supple. No masses, No LAD. Ears and Nose: No external deformity. CV: RRR, No M/G/R. No JVD. No thrill. No extra heart sounds. PULM: CTA B, no wheezes, crackles, rhonchi. No retractions. No resp. distress. No accessory muscle use. EXTR: No c/c/e NEURO Normal gait.  PSYCH: Normally interactive. Conversant. Not depressed or anxious appearing.  Calm demeanor.   Right foot: no swelling.  She is tender and slightly red at the first MTP.  She is tender with movement of this joint.    Assessment and Plan: Gout - Plan: colchicine 0.6 MG tablet  Treat with colchicine as below.  If not better in the next day or so she will let me know  Meds ordered this encounter  Medications  . hydrochlorothiazide (HYDRODIURIL) 25 MG tablet    Sig: Take 25 mg by mouth daily.  . colchicine 0.6 MG tablet    Sig: Take 2 tablets, then take 1 tablet an hour later. Use as needed for gout attack    Dispense:  30 tablet    Refill:  0     Signed Lamar Blinks, MD

## 2013-11-02 DIAGNOSIS — M25559 Pain in unspecified hip: Secondary | ICD-10-CM | POA: Diagnosis not present

## 2013-11-08 DIAGNOSIS — I10 Essential (primary) hypertension: Secondary | ICD-10-CM | POA: Diagnosis not present

## 2013-11-08 DIAGNOSIS — Z0000 Encounter for general adult medical examination without abnormal findings: Secondary | ICD-10-CM | POA: Diagnosis not present

## 2013-11-08 DIAGNOSIS — Z23 Encounter for immunization: Secondary | ICD-10-CM | POA: Diagnosis not present

## 2013-11-08 DIAGNOSIS — Z1331 Encounter for screening for depression: Secondary | ICD-10-CM | POA: Diagnosis not present

## 2013-11-08 DIAGNOSIS — R109 Unspecified abdominal pain: Secondary | ICD-10-CM | POA: Diagnosis not present

## 2013-11-17 ENCOUNTER — Encounter: Payer: Self-pay | Admitting: *Deleted

## 2013-12-16 ENCOUNTER — Other Ambulatory Visit: Payer: Self-pay | Admitting: Interventional Cardiology

## 2013-12-17 ENCOUNTER — Other Ambulatory Visit: Payer: Self-pay | Admitting: Interventional Cardiology

## 2013-12-31 DIAGNOSIS — H40019 Open angle with borderline findings, low risk, unspecified eye: Secondary | ICD-10-CM | POA: Diagnosis not present

## 2013-12-31 DIAGNOSIS — H5200 Hypermetropia, unspecified eye: Secondary | ICD-10-CM | POA: Diagnosis not present

## 2013-12-31 DIAGNOSIS — H01009 Unspecified blepharitis unspecified eye, unspecified eyelid: Secondary | ICD-10-CM | POA: Diagnosis not present

## 2013-12-31 DIAGNOSIS — Z961 Presence of intraocular lens: Secondary | ICD-10-CM | POA: Diagnosis not present

## 2014-01-03 DIAGNOSIS — N183 Chronic kidney disease, stage 3 unspecified: Secondary | ICD-10-CM | POA: Diagnosis not present

## 2014-01-03 DIAGNOSIS — I129 Hypertensive chronic kidney disease with stage 1 through stage 4 chronic kidney disease, or unspecified chronic kidney disease: Secondary | ICD-10-CM | POA: Diagnosis not present

## 2014-02-13 DIAGNOSIS — M76899 Other specified enthesopathies of unspecified lower limb, excluding foot: Secondary | ICD-10-CM | POA: Diagnosis not present

## 2014-02-19 DIAGNOSIS — M7150 Other bursitis, not elsewhere classified, unspecified site: Secondary | ICD-10-CM | POA: Diagnosis not present

## 2014-02-19 DIAGNOSIS — M25569 Pain in unspecified knee: Secondary | ICD-10-CM | POA: Diagnosis not present

## 2014-02-20 DIAGNOSIS — M7150 Other bursitis, not elsewhere classified, unspecified site: Secondary | ICD-10-CM | POA: Diagnosis not present

## 2014-02-20 DIAGNOSIS — M25569 Pain in unspecified knee: Secondary | ICD-10-CM | POA: Diagnosis not present

## 2014-02-25 DIAGNOSIS — M25569 Pain in unspecified knee: Secondary | ICD-10-CM | POA: Diagnosis not present

## 2014-02-25 DIAGNOSIS — M7150 Other bursitis, not elsewhere classified, unspecified site: Secondary | ICD-10-CM | POA: Diagnosis not present

## 2014-02-27 DIAGNOSIS — M7150 Other bursitis, not elsewhere classified, unspecified site: Secondary | ICD-10-CM | POA: Diagnosis not present

## 2014-02-27 DIAGNOSIS — M25569 Pain in unspecified knee: Secondary | ICD-10-CM | POA: Diagnosis not present

## 2014-03-04 DIAGNOSIS — M7150 Other bursitis, not elsewhere classified, unspecified site: Secondary | ICD-10-CM | POA: Diagnosis not present

## 2014-03-04 DIAGNOSIS — M25569 Pain in unspecified knee: Secondary | ICD-10-CM | POA: Diagnosis not present

## 2014-03-11 DIAGNOSIS — M25569 Pain in unspecified knee: Secondary | ICD-10-CM | POA: Diagnosis not present

## 2014-03-11 DIAGNOSIS — M7150 Other bursitis, not elsewhere classified, unspecified site: Secondary | ICD-10-CM | POA: Diagnosis not present

## 2014-03-13 DIAGNOSIS — M25569 Pain in unspecified knee: Secondary | ICD-10-CM | POA: Diagnosis not present

## 2014-03-13 DIAGNOSIS — M7150 Other bursitis, not elsewhere classified, unspecified site: Secondary | ICD-10-CM | POA: Diagnosis not present

## 2014-04-16 DIAGNOSIS — M76899 Other specified enthesopathies of unspecified lower limb, excluding foot: Secondary | ICD-10-CM | POA: Diagnosis not present

## 2014-05-08 DIAGNOSIS — Z961 Presence of intraocular lens: Secondary | ICD-10-CM | POA: Diagnosis not present

## 2014-05-08 DIAGNOSIS — H40019 Open angle with borderline findings, low risk, unspecified eye: Secondary | ICD-10-CM | POA: Diagnosis not present

## 2014-05-08 DIAGNOSIS — H4720 Unspecified optic atrophy: Secondary | ICD-10-CM | POA: Diagnosis not present

## 2014-05-08 DIAGNOSIS — H5200 Hypermetropia, unspecified eye: Secondary | ICD-10-CM | POA: Diagnosis not present

## 2014-05-08 DIAGNOSIS — H02409 Unspecified ptosis of unspecified eyelid: Secondary | ICD-10-CM | POA: Diagnosis not present

## 2014-05-09 DIAGNOSIS — E669 Obesity, unspecified: Secondary | ICD-10-CM | POA: Diagnosis not present

## 2014-05-09 DIAGNOSIS — Z6833 Body mass index (BMI) 33.0-33.9, adult: Secondary | ICD-10-CM | POA: Diagnosis not present

## 2014-05-09 DIAGNOSIS — I10 Essential (primary) hypertension: Secondary | ICD-10-CM | POA: Diagnosis not present

## 2014-05-20 ENCOUNTER — Encounter: Payer: Self-pay | Admitting: Interventional Cardiology

## 2014-05-20 ENCOUNTER — Ambulatory Visit (INDEPENDENT_AMBULATORY_CARE_PROVIDER_SITE_OTHER): Payer: Medicare Other | Admitting: Interventional Cardiology

## 2014-05-20 VITALS — BP 140/78 | HR 71 | Ht 63.0 in | Wt 184.4 lb

## 2014-05-20 DIAGNOSIS — I209 Angina pectoris, unspecified: Secondary | ICD-10-CM | POA: Diagnosis not present

## 2014-05-20 DIAGNOSIS — I10 Essential (primary) hypertension: Secondary | ICD-10-CM | POA: Diagnosis not present

## 2014-05-20 DIAGNOSIS — E785 Hyperlipidemia, unspecified: Secondary | ICD-10-CM | POA: Diagnosis not present

## 2014-05-20 DIAGNOSIS — I25119 Atherosclerotic heart disease of native coronary artery with unspecified angina pectoris: Secondary | ICD-10-CM | POA: Diagnosis not present

## 2014-05-20 NOTE — Patient Instructions (Signed)
Your physician recommends that you continue on your current medications as directed. Please refer to the Current Medication list given to you today.   Your physician recommends that you schedule a follow-up appointment as needed  

## 2014-05-20 NOTE — Progress Notes (Signed)
Patient ID: [name], female   DOB: [birth year], 80 y.o.   MRN: [id number]    [address], [place]  [address] Phone: [phone] Fax:  [phone]  Date:  05/20/2014   ID:  [name], DOB 11/26/1928, MRN [id number]  PCP:  Mathews Argyle, MD   ASSESSMENT:  1. Coronary artery disease, stable on medical regimen now for several years. She is now using sublingual nitroglycerin or having limitations related to her heart 2. Excessive daytime sleepiness and fatigue. We discussed the possibility of sleep apnea 3. Hypertension 4. Hyperlipidemia 5. EKG suggests the possibility of right ventricular hypertrophy which could be related to pulmonary hypertension which could be related to sleep apnea.  PLAN:  1. Continue risk factor modification 2. Consider sleep study. 3. We will see on an as-needed basis if angina recurs or other cardiovascular concerns arise. 4. I have not ordered an echocardiogram given the absence of clinical signs or symptoms of right heart failure.   SUBJECTIVE: [name] is a 80 y.o. female who has no cardiopulmonary complaints. She denies angina. Since medication adjustment, she has not needed nitroglycerin. She tolerates the medication regimen without difficulty. Several years ago, the addition of long-acting nitrates relieved chest discomfort. She does have an abnormal myocardial perfusion study from 2013. She denies orthopnea, PND, and syncope.   Wt Readings from Last 3 Encounters:  05/20/14 184 lb 6.4 oz (83.643 kg)  11/01/13 185 lb (83.915 kg)  08/02/13 183 lb 14.4 oz (83.416 kg)     Past Medical History  Diagnosis Date  . Hypertension   . CAD (coronary artery disease)     a. Mod prox LAD & first diagonal stenosis by cath 2002 - managed medically since that time with normal LV function.  . Hyperlipidemia   . Arthritis   . Cataract     Current Outpatient Prescriptions  Medication Sig Dispense Refill  .  acetaminophen (TYLENOL) 500 MG tablet Take 500 mg by mouth every 6 (six) hours as needed for mild pain or headache.      . ALPRAZolam (XANAX) 0.5 MG tablet Take 0.25-0.5 mg by mouth at bedtime as needed for sleep. For sleep      . amLODipine (NORVASC) 2.5 MG tablet Take 2.5 mg by mouth every morning.      Marland Kitchen aspirin EC 325 MG tablet Take 325 mg by mouth daily.      . cholecalciferol (VITAMIN D) 1000 UNITS tablet Take 1,000 Units by mouth every morning.      . colchicine 0.6 MG tablet Take 2 tablets, then take 1 tablet an hour later. Use as needed for gout attack  30 tablet  0  . ferrous sulfate 325 (65 FE) MG tablet Take 325 mg by mouth daily with breakfast.      . fish oil-omega-3 fatty acids 1000 MG capsule Take 2 g by mouth daily after lunch.      . hydrochlorothiazide (HYDRODIURIL) 25 MG tablet Take 25 mg by mouth daily.      . isosorbide mononitrate (IMDUR) 60 MG 24 hr tablet TAKE 1 TABLET BY MOUTH DAILY  90 tablet  0  . Multiple Vitamin (MULTIVITAMIN WITH MINERALS) TABS Take 1 tablet by mouth daily after lunch.      . pantoprazole (PROTONIX) 40 MG tablet Take 40 mg by mouth daily.      Vladimir Faster Glycol-Propyl Glycol (SYSTANE OP) Place 1 drop into the right eye 2 (two) times daily.       Marland Kitchen  feeding supplement, RESOURCE BREEZE, (RESOURCE BREEZE) LIQD Take 1 Container by mouth daily.    0   No current facility-administered medications for this visit.    Allergies:    Allergies  Allergen Reactions  . Felodipine     Reaction: unknown possible nausea or rash  . Omeprazole     Reaction: unknown possible nausea or rash  . Shrimp [Shellfish Allergy] Nausea And Vomiting    Social History:  The patient  reports that she has never smoked. She has never used smokeless tobacco. She reports that she does not drink alcohol or use illicit drugs.   ROS:  Please see the history of present illness.   No prolonged palpitations. Denies transient neurological symptoms. No peripheral edema. No  exertion-related chest burning and tightness.   All other systems reviewed and negative.   OBJECTIVE: VS:  BP 140/78  Pulse 71  Ht 5\' 3"  (1.6 m)  Wt 184 lb 6.4 oz (83.643 kg)  BMI 32.67 kg/m2 Well nourished, well developed, in no acute distress, elderly and obese HEENT: normal Neck: JVD flat. Carotid bruit absent  Cardiac:  normal S1, S2; IRR; no murmur Lungs:  clear to auscultation bilaterally, no wheezing, rhonchi or rales Abd: soft, nontender, no hepatomegaly Ext: Edema absent. Pulses 2+ and symmetric Skin: warm and dry Neuro:  CNs 2-12 intact, no focal abnormalities noted  EKG:  Normal sinus rhythm with atrial premature contractions incomplete right bundle branch block with vertical axis .   No change compared to prior tracing from 2014.    Signed, Illene Labrador III, MD 05/20/2014 10:10 AM

## 2014-05-29 DIAGNOSIS — Z1231 Encounter for screening mammogram for malignant neoplasm of breast: Secondary | ICD-10-CM | POA: Diagnosis not present

## 2014-07-04 DIAGNOSIS — I129 Hypertensive chronic kidney disease with stage 1 through stage 4 chronic kidney disease, or unspecified chronic kidney disease: Secondary | ICD-10-CM | POA: Diagnosis not present

## 2014-07-04 DIAGNOSIS — R609 Edema, unspecified: Secondary | ICD-10-CM | POA: Diagnosis not present

## 2014-07-04 DIAGNOSIS — N183 Chronic kidney disease, stage 3 (moderate): Secondary | ICD-10-CM | POA: Diagnosis not present

## 2014-07-16 ENCOUNTER — Other Ambulatory Visit: Payer: Self-pay | Admitting: Interventional Cardiology

## 2014-08-01 DIAGNOSIS — I129 Hypertensive chronic kidney disease with stage 1 through stage 4 chronic kidney disease, or unspecified chronic kidney disease: Secondary | ICD-10-CM | POA: Diagnosis not present

## 2014-08-01 DIAGNOSIS — N183 Chronic kidney disease, stage 3 (moderate): Secondary | ICD-10-CM | POA: Diagnosis not present

## 2014-08-01 DIAGNOSIS — M722 Plantar fascial fibromatosis: Secondary | ICD-10-CM | POA: Diagnosis not present

## 2014-08-06 ENCOUNTER — Ambulatory Visit (INDEPENDENT_AMBULATORY_CARE_PROVIDER_SITE_OTHER): Payer: Medicare Other | Admitting: Podiatry

## 2014-08-06 ENCOUNTER — Encounter: Payer: Self-pay | Admitting: Podiatry

## 2014-08-06 ENCOUNTER — Ambulatory Visit (INDEPENDENT_AMBULATORY_CARE_PROVIDER_SITE_OTHER): Payer: Medicare Other

## 2014-08-06 VITALS — BP 140/65 | HR 70 | Resp 16

## 2014-08-06 DIAGNOSIS — M722 Plantar fascial fibromatosis: Secondary | ICD-10-CM | POA: Diagnosis not present

## 2014-08-06 DIAGNOSIS — S92302A Fracture of unspecified metatarsal bone(s), left foot, initial encounter for closed fracture: Secondary | ICD-10-CM

## 2014-08-06 DIAGNOSIS — I25119 Atherosclerotic heart disease of native coronary artery with unspecified angina pectoris: Secondary | ICD-10-CM

## 2014-08-06 NOTE — Progress Notes (Signed)
   Subjective:    Patient ID: [name], female    DOB: 03-Nov-1928, 80 y.o.   MRN: [id number]  HPI Comments: "My foot hurts"  Patient c/o aching dorsal midfoot, left for several months. She thought it was just arthritis. Swells a lot. Painful to walk. Takes Tylenol-no help.  Foot Pain      Review of Systems  All other systems reviewed and are negative.      Objective:   Physical Exam: I have reviewed her past medical history medications allergies surgery social history and review of systems. Pulses are strongly palpable left foot. Venous insufficiency is prominent left lower extremity over the right. Pitting edema is noted anterior leg. No calf pain. Neurologic sensorium is intact per Semmes-Weinstein monofilament deep tendon reflexes are intact bilateral muscle strength is 5 over 5 dorsiflexion plantar flexors and inverters everters all intrinsic musculature is intact. Orthopedic evaluation demonstrates all joints distal to the ankle for range of motion without crepitation. She does have pain on palpation at the base of the second metatarsal. Radiographic evaluation demonstrates what appears to be a nondisplaced cortical fracture of the second metatarsal of the left foot. This does not appear to be a new fracture nor is it displaced or comminuted.  Assessment: Fracture second metatarsal left foot.  Plan: I put her in a compression anklet and a Darco shoe she is encouraged to stay off the foot as much as possible and keep it elevated I will follow up with her in 4 weeks for another set of x-rays.        Assessment & Plan:

## 2014-08-17 ENCOUNTER — Emergency Department (HOSPITAL_COMMUNITY): Payer: Medicare Other

## 2014-08-17 ENCOUNTER — Encounter (HOSPITAL_COMMUNITY): Payer: Self-pay | Admitting: Emergency Medicine

## 2014-08-17 ENCOUNTER — Emergency Department (HOSPITAL_COMMUNITY)
Admission: EM | Admit: 2014-08-17 | Discharge: 2014-08-17 | Disposition: A | Discharge status: Home / Self Care | Payer: Medicare Other | Attending: Emergency Medicine | Admitting: Emergency Medicine

## 2014-08-17 DIAGNOSIS — M1990 Unspecified osteoarthritis, unspecified site: Secondary | ICD-10-CM | POA: Insufficient documentation

## 2014-08-17 DIAGNOSIS — H269 Unspecified cataract: Secondary | ICD-10-CM | POA: Insufficient documentation

## 2014-08-17 DIAGNOSIS — M10072 Idiopathic gout, left ankle and foot: Secondary | ICD-10-CM | POA: Diagnosis not present

## 2014-08-17 DIAGNOSIS — Z7982 Long term (current) use of aspirin: Secondary | ICD-10-CM | POA: Diagnosis not present

## 2014-08-17 DIAGNOSIS — M109 Gout, unspecified: Secondary | ICD-10-CM

## 2014-08-17 DIAGNOSIS — Z79899 Other long term (current) drug therapy: Secondary | ICD-10-CM | POA: Diagnosis not present

## 2014-08-17 DIAGNOSIS — I10 Essential (primary) hypertension: Secondary | ICD-10-CM | POA: Diagnosis not present

## 2014-08-17 DIAGNOSIS — I2510 Atherosclerotic heart disease of native coronary artery without angina pectoris: Secondary | ICD-10-CM | POA: Insufficient documentation

## 2014-08-17 DIAGNOSIS — M79672 Pain in left foot: Secondary | ICD-10-CM

## 2014-08-17 DIAGNOSIS — E785 Hyperlipidemia, unspecified: Secondary | ICD-10-CM | POA: Diagnosis not present

## 2014-08-17 DIAGNOSIS — Z8781 Personal history of (healed) traumatic fracture: Secondary | ICD-10-CM | POA: Diagnosis not present

## 2014-08-17 DIAGNOSIS — M19072 Primary osteoarthritis, left ankle and foot: Secondary | ICD-10-CM | POA: Diagnosis not present

## 2014-08-17 MED ORDER — ACETAMINOPHEN 500 MG PO TABS
500.0000 mg | ORAL_TABLET | Freq: Once | ORAL | Status: AC
  Administered 2014-08-17: 500 mg via ORAL
  Filled 2014-08-17: qty 1

## 2014-08-17 MED ORDER — COLCHICINE 0.6 MG PO TABS
1.2000 mg | ORAL_TABLET | Freq: Once | ORAL | Status: AC
Start: 2014-08-17 — End: 2014-08-17
  Administered 2014-08-17: 1.2 mg via ORAL
  Filled 2014-08-17 (×2): qty 2

## 2014-08-17 MED ORDER — COLCHICINE 0.6 MG PO TABS: 0.6000 mg | ORAL_TABLET | Freq: Every day | ORAL | Status: DC

## 2014-08-17 NOTE — Discharge Instructions (Signed)

## 2014-08-17 NOTE — ED Notes (Signed)
Son called stating he was at [facility] to pick up prescription for pt and no one had called/faxed it in. Spoke with Dr Venora Maples who called it in. Pt updated to check with pharmacy in an hour or so to pick up.

## 2014-08-17 NOTE — ED Provider Notes (Signed)
CSN: [id number]     Arrival date & time 08/17/14  1227 History   First MD Initiated Contact with Patient 08/17/14 1346     Chief Complaint  Patient presents with  . Gout  . Toe Pain     (Consider location/radiation/quality/duration/timing/severity/associated sxs/prior Treatment) HPI Comments: Patient presents for left foot pain.  She was seen on 08/06/2014 by podiatry for same with report of several months of left foot pain.  She was diagnosed with plantar fasciitis found to have an incidental chronic metatarsal fracture and was placed in a hard soled shoe.  She states is ago her left first M.D. P joint began becoming red, increasingly painful, swollen, which she says is consistent with prior episodes of gout.  She says the pain is terrible, although denies taking any medicine for the pain today  Patient is a 81 y.o. female presenting with toe pain. The history is provided by the patient and a relative. No language interpreter was used.  Toe Pain This is a recurrent problem. The current episode started 2 days ago. The problem occurs constantly. The problem has been gradually worsening. Pertinent negatives include no chest pain, no abdominal pain, no headaches and no shortness of breath. Nothing aggravates the symptoms. Nothing relieves the symptoms. She has tried nothing for the symptoms. The treatment provided no relief.    Past Medical History  Diagnosis Date  . Hypertension   . CAD (coronary artery disease)     a. Mod prox LAD & first diagonal stenosis by cath 2002 - managed medically since that time with normal LV function.  . Hyperlipidemia   . Arthritis   . Cataract    Past Surgical History  Procedure Laterality Date  . Back surgery    . Abdominal hysterectomy    . Knee surgery    . Hernia repair    . Shoulder surgery    . Carpal tunnel release    . Cholecystectomy N/A 06/19/2013    Procedure: LAPAROSCOPIC CHOLECYSTECTOMY;  Surgeon: Harl Bowie, MD;  Location: [facility];   Service: General;  Laterality: N/A;  . Splenectomy, total    . Appendectomy     Family History  Problem Relation Age of Onset  . Heart disease Mother     "enlarged valve"  . Stroke Father   . Cancer Sister    History  Substance Use Topics  . Smoking status: Never Smoker   . Smokeless tobacco: Never Used  . Alcohol Use: No   OB History    No data available     Review of Systems  Constitutional: Negative for fever, chills, diaphoresis, activity change, appetite change and fatigue.  HENT: Negative for congestion, facial swelling, rhinorrhea and sore throat.   Eyes: Negative for photophobia and discharge.  Respiratory: Negative for cough, chest tightness and shortness of breath.   Cardiovascular: Negative for chest pain, palpitations and leg swelling.  Gastrointestinal: Negative for nausea, vomiting, abdominal pain and diarrhea.  Endocrine: Negative for polydipsia and polyuria.  Genitourinary: Negative for dysuria, frequency, difficulty urinating and pelvic pain.  Musculoskeletal: Negative for back pain, arthralgias, neck pain and neck stiffness.  Skin: Negative for color change and wound.  Allergic/Immunologic: Negative for immunocompromised state.  Neurological: Negative for facial asymmetry, weakness, numbness and headaches.  Hematological: Does not bruise/bleed easily.  Psychiatric/Behavioral: Negative for confusion and agitation.      Allergies  Felodipine; Omeprazole; and Shrimp  Home Medications   Prior to Admission medications   Medication Sig Start Date  End Date Taking? Authorizing Provider  acetaminophen (TYLENOL) 500 MG tablet Take 500 mg by mouth every 6 (six) hours as needed for mild pain or headache.   Yes Historical Provider, MD  ALPRAZolam Duanne Moron) 0.5 MG tablet Take 0.25-0.5 mg by mouth at bedtime as needed for sleep. For sleep   Yes Historical Provider, MD  aspirin EC 325 MG tablet Take 325 mg by mouth daily.   Yes Historical Provider, MD    cholecalciferol (VITAMIN D) 1000 UNITS tablet Take 1,000 Units by mouth every morning.   Yes Historical Provider, MD  feeding supplement, RESOURCE BREEZE, (RESOURCE BREEZE) LIQD Take 1 Container by mouth daily. 08/03/13  Yes Maryann Mikhail, DO  ferrous sulfate 325 (65 FE) MG tablet Take 325 mg by mouth daily with breakfast.   Yes Historical Provider, MD  fish oil-omega-3 fatty acids 1000 MG capsule Take 2 g by mouth daily after lunch.   Yes Historical Provider, MD  hydrochlorothiazide (HYDRODIURIL) 25 MG tablet Take 25 mg by mouth daily.   Yes Historical Provider, MD  isosorbide mononitrate (IMDUR) 60 MG 24 hr tablet TAKE 1 TABLET BY MOUTH DAILY 07/17/14  Yes Sinclair Grooms, MD  Multiple Vitamin (MULTIVITAMIN WITH MINERALS) TABS Take 1 tablet by mouth daily after lunch.   Yes Historical Provider, MD  Polyethyl Glycol-Propyl Glycol (SYSTANE OP) Place 1 drop into the right eye 2 (two) times daily.    Yes Historical Provider, MD  ranitidine (ZANTAC) 150 MG tablet Take 150 mg by mouth daily as needed for heartburn.   Yes Historical Provider, MD  colchicine 0.6 MG tablet Take 1 tablet (0.6 mg total) by mouth daily. Take 2 tablets by mouth, then 1 tablet, one hour later for acute gout attack, then one daily for acute gout flare 08/17/14   Ernestina Patches, MD   BP 133/56 mmHg  Pulse 77  Temp(Src) 98.3 F (36.8 C) (Oral)  Resp 16  SpO2 97% Physical Exam  Constitutional: She is oriented to person, place, and time. She appears well-developed and well-nourished. No distress.  HENT:  Head: Normocephalic and atraumatic.  Mouth/Throat: No oropharyngeal exudate.  Eyes: Pupils are equal, round, and reactive to light.  Neck: Normal range of motion. Neck supple.  Cardiovascular: Normal rate, regular rhythm and normal heart sounds.  Exam reveals no gallop and no friction rub.   No murmur heard. Pulmonary/Chest: Effort normal and breath sounds normal. No respiratory distress. She has no wheezes. She has no  rales.  Abdominal: Soft. Bowel sounds are normal. She exhibits no distension and no mass. There is no tenderness. There is no rebound and no guarding.  Musculoskeletal: Normal range of motion. She exhibits no edema.       Left foot: There is tenderness and swelling.       Feet:  Neurological: She is alert and oriented to person, place, and time.  Skin: Skin is warm and dry.  Psychiatric: She has a normal mood and affect.    ED Course  Procedures (including critical care time) Labs Review Labs Reviewed - No data to display  Imaging Review Dg Foot Complete Left  08/17/2014   CLINICAL DATA:  Pain, primarily laterally.  No recent injury.  EXAM: LEFT FOOT - COMPLETE 3+ VIEW  COMPARISON:  08/06/2014 outside films.  No report available.  FINDINGS: Moderate joint space narrowing and osteophyte formation involving the first metatarsal phalangeal joint. Mild hallux valgus deformity. No acute fracture or dislocation. No callus deposition or periosteal reaction. Small calcaneal spur.  Mild pes planus deformity.  IMPRESSION: Degenerative change, without acute osseous finding.   Electronically Signed   By: Abigail Miyamoto M.D.   On: 08/17/2014 15:36     EKG Interpretation None      MDM   Final diagnoses:  Foot pain, left  Acute gout of left foot, unspecified cause    Pt is a 81 y.o. female with Pmhx as above who presents with continued L foot pain, now with worsening pain at L first MTP joint that she states feels likprior episodes of gout which she has had in the same location. She was seen by podiatry on 12/22 for foot pain found to have an incidental metatarsal frac, placed in hard soled boot.  She has had no falls or recent injuries.  No fever.  On physical exam, vital signs are stable.  She is in no acute distress.  She has erythema at the left first MTP joint.  She she'll has tenderness over the dorsal midfoot and plantar fascia (diagnosed with plantar fasciitis last week by podiatry).  X-ray  ordered given history of recent incidentally found foot fractures, but had no acute findings.  Colchicine, will be given. She can otherwise continue tylenol, ibuprofen.      [name] evaluation in the Emergency Department is complete. It has been determined that no acute conditions requiring further emergency intervention are present at this time. The patient/guardian have been advised of the diagnosis and plan. We have discussed signs and symptoms that warrant return to the ED, such as changes or worsening in symptoms, worsening redness, red streaking, fevers.       Ernestina Patches, MD 08/17/14 [phone]

## 2014-08-17 NOTE — ED Notes (Signed)
Patient saw foot doctor ([place]) last Tuesday for fracture in big toe. Applied compression stocking and shoe-no surgical intervention required. Patient starting to have increased pain and thinks she has gout in big toe. Has hx gout. No medications taken for pain today. Denies numbness in foot. Still able to wiggle toes. Pedal pulse present.

## 2014-08-20 ENCOUNTER — Ambulatory Visit: Payer: Medicare Other

## 2014-09-03 ENCOUNTER — Ambulatory Visit (INDEPENDENT_AMBULATORY_CARE_PROVIDER_SITE_OTHER): Payer: Medicare Other | Admitting: Podiatry

## 2014-09-03 ENCOUNTER — Encounter: Payer: Self-pay | Admitting: Podiatry

## 2014-09-03 ENCOUNTER — Ambulatory Visit (INDEPENDENT_AMBULATORY_CARE_PROVIDER_SITE_OTHER): Payer: Medicare Other

## 2014-09-03 VITALS — BP 141/68 | HR 74 | Resp 12

## 2014-09-03 DIAGNOSIS — S92302A Fracture of unspecified metatarsal bone(s), left foot, initial encounter for closed fracture: Secondary | ICD-10-CM

## 2014-09-03 NOTE — Progress Notes (Signed)
She presented today for follow-up of fractured second metatarsal left foot. She states it is still moderately tender but feels much better than it did previously.  Objective: Vital signs are stable she is alert and oriented 3 she has mild tenderness to the dorsal aspect of the second metatarsal metadiaphyseal region and at the base of the proximal phalanx. Radiographs today confirm what appears to be an end ostial stress reaction as well as an avulsion fracture of the base of the second proximal phalanx left foot.  Assessment: Well-healing fractured second metatarsal left foot.  Plan: I encouraged her to wear the [place] shoe for another 2 weeks and then switch back to her regular shoes if which she is supposed to wear anytime she is walking. I encouraged her not to wear better shoes in the house but to wear tennis shoes instead I will follow-up with her in the next 3 weeks if necessary.

## 2014-09-17 DIAGNOSIS — H40013 Open angle with borderline findings, low risk, bilateral: Secondary | ICD-10-CM | POA: Diagnosis not present

## 2014-12-05 DIAGNOSIS — H6123 Impacted cerumen, bilateral: Secondary | ICD-10-CM | POA: Diagnosis not present

## 2014-12-05 DIAGNOSIS — E780 Pure hypercholesterolemia: Secondary | ICD-10-CM | POA: Diagnosis not present

## 2014-12-05 DIAGNOSIS — R269 Unspecified abnormalities of gait and mobility: Secondary | ICD-10-CM | POA: Diagnosis not present

## 2014-12-05 DIAGNOSIS — Z0000 Encounter for general adult medical examination without abnormal findings: Secondary | ICD-10-CM | POA: Diagnosis not present

## 2014-12-05 DIAGNOSIS — Z6833 Body mass index (BMI) 33.0-33.9, adult: Secondary | ICD-10-CM | POA: Diagnosis not present

## 2014-12-05 DIAGNOSIS — Z79899 Other long term (current) drug therapy: Secondary | ICD-10-CM | POA: Diagnosis not present

## 2014-12-05 DIAGNOSIS — Z1389 Encounter for screening for other disorder: Secondary | ICD-10-CM | POA: Diagnosis not present

## 2014-12-05 DIAGNOSIS — E559 Vitamin D deficiency, unspecified: Secondary | ICD-10-CM | POA: Diagnosis not present

## 2014-12-05 DIAGNOSIS — E669 Obesity, unspecified: Secondary | ICD-10-CM | POA: Diagnosis not present

## 2014-12-05 DIAGNOSIS — R5383 Other fatigue: Secondary | ICD-10-CM | POA: Diagnosis not present

## 2014-12-05 DIAGNOSIS — I129 Hypertensive chronic kidney disease with stage 1 through stage 4 chronic kidney disease, or unspecified chronic kidney disease: Secondary | ICD-10-CM | POA: Diagnosis not present

## 2014-12-05 DIAGNOSIS — N183 Chronic kidney disease, stage 3 (moderate): Secondary | ICD-10-CM | POA: Diagnosis not present

## 2014-12-16 DIAGNOSIS — R269 Unspecified abnormalities of gait and mobility: Secondary | ICD-10-CM | POA: Diagnosis not present

## 2014-12-23 DIAGNOSIS — R269 Unspecified abnormalities of gait and mobility: Secondary | ICD-10-CM | POA: Diagnosis not present

## 2014-12-26 DIAGNOSIS — R269 Unspecified abnormalities of gait and mobility: Secondary | ICD-10-CM | POA: Diagnosis not present

## 2014-12-30 DIAGNOSIS — R269 Unspecified abnormalities of gait and mobility: Secondary | ICD-10-CM | POA: Diagnosis not present

## 2015-01-01 DIAGNOSIS — R269 Unspecified abnormalities of gait and mobility: Secondary | ICD-10-CM | POA: Diagnosis not present

## 2015-01-03 DIAGNOSIS — L820 Inflamed seborrheic keratosis: Secondary | ICD-10-CM | POA: Diagnosis not present

## 2015-01-03 DIAGNOSIS — L814 Other melanin hyperpigmentation: Secondary | ICD-10-CM | POA: Diagnosis not present

## 2015-01-03 DIAGNOSIS — L578 Other skin changes due to chronic exposure to nonionizing radiation: Secondary | ICD-10-CM | POA: Diagnosis not present

## 2015-01-03 DIAGNOSIS — L570 Actinic keratosis: Secondary | ICD-10-CM | POA: Diagnosis not present

## 2015-01-06 DIAGNOSIS — R269 Unspecified abnormalities of gait and mobility: Secondary | ICD-10-CM | POA: Diagnosis not present

## 2015-01-08 DIAGNOSIS — R269 Unspecified abnormalities of gait and mobility: Secondary | ICD-10-CM | POA: Diagnosis not present

## 2015-06-05 DIAGNOSIS — Z79899 Other long term (current) drug therapy: Secondary | ICD-10-CM | POA: Diagnosis not present

## 2015-06-05 DIAGNOSIS — I129 Hypertensive chronic kidney disease with stage 1 through stage 4 chronic kidney disease, or unspecified chronic kidney disease: Secondary | ICD-10-CM | POA: Diagnosis not present

## 2015-06-05 DIAGNOSIS — D539 Nutritional anemia, unspecified: Secondary | ICD-10-CM | POA: Diagnosis not present

## 2015-06-05 DIAGNOSIS — N183 Chronic kidney disease, stage 3 (moderate): Secondary | ICD-10-CM | POA: Diagnosis not present

## 2015-06-05 DIAGNOSIS — Z6832 Body mass index (BMI) 32.0-32.9, adult: Secondary | ICD-10-CM | POA: Diagnosis not present

## 2015-06-05 DIAGNOSIS — E669 Obesity, unspecified: Secondary | ICD-10-CM | POA: Diagnosis not present

## 2015-06-05 DIAGNOSIS — R5383 Other fatigue: Secondary | ICD-10-CM | POA: Diagnosis not present

## 2015-06-13 DIAGNOSIS — L578 Other skin changes due to chronic exposure to nonionizing radiation: Secondary | ICD-10-CM | POA: Diagnosis not present

## 2015-06-13 DIAGNOSIS — L570 Actinic keratosis: Secondary | ICD-10-CM | POA: Diagnosis not present

## 2015-06-18 DIAGNOSIS — H40013 Open angle with borderline findings, low risk, bilateral: Secondary | ICD-10-CM | POA: Diagnosis not present

## 2015-06-18 DIAGNOSIS — H02403 Unspecified ptosis of bilateral eyelids: Secondary | ICD-10-CM | POA: Diagnosis not present

## 2015-06-18 DIAGNOSIS — Z961 Presence of intraocular lens: Secondary | ICD-10-CM | POA: Diagnosis not present

## 2015-06-18 DIAGNOSIS — H47293 Other optic atrophy, bilateral: Secondary | ICD-10-CM | POA: Diagnosis not present

## 2015-06-24 DIAGNOSIS — Z1231 Encounter for screening mammogram for malignant neoplasm of breast: Secondary | ICD-10-CM | POA: Diagnosis not present

## 2015-06-30 DIAGNOSIS — D649 Anemia, unspecified: Secondary | ICD-10-CM | POA: Diagnosis not present

## 2015-07-04 DIAGNOSIS — L814 Other melanin hyperpigmentation: Secondary | ICD-10-CM | POA: Diagnosis not present

## 2015-07-04 DIAGNOSIS — L570 Actinic keratosis: Secondary | ICD-10-CM | POA: Diagnosis not present

## 2015-07-04 DIAGNOSIS — L578 Other skin changes due to chronic exposure to nonionizing radiation: Secondary | ICD-10-CM | POA: Diagnosis not present

## 2015-07-04 DIAGNOSIS — L820 Inflamed seborrheic keratosis: Secondary | ICD-10-CM | POA: Diagnosis not present

## 2015-07-24 DIAGNOSIS — D649 Anemia, unspecified: Secondary | ICD-10-CM | POA: Diagnosis not present

## 2015-07-26 ENCOUNTER — Other Ambulatory Visit: Payer: Self-pay | Admitting: Interventional Cardiology

## 2015-08-28 DIAGNOSIS — L570 Actinic keratosis: Secondary | ICD-10-CM | POA: Diagnosis not present

## 2015-08-28 DIAGNOSIS — L218 Other seborrheic dermatitis: Secondary | ICD-10-CM | POA: Diagnosis not present

## 2015-08-28 DIAGNOSIS — L814 Other melanin hyperpigmentation: Secondary | ICD-10-CM | POA: Diagnosis not present

## 2015-09-07 ENCOUNTER — Ambulatory Visit (INDEPENDENT_AMBULATORY_CARE_PROVIDER_SITE_OTHER): Payer: Medicare Other

## 2015-09-07 ENCOUNTER — Ambulatory Visit (INDEPENDENT_AMBULATORY_CARE_PROVIDER_SITE_OTHER): Payer: Medicare Other | Admitting: Family Medicine

## 2015-09-07 VITALS — BP 146/74 | HR 67 | Temp 98.0°F | Resp 17 | Ht 63.0 in | Wt 170.0 lb

## 2015-09-07 DIAGNOSIS — R05 Cough: Secondary | ICD-10-CM

## 2015-09-07 DIAGNOSIS — R059 Cough, unspecified: Secondary | ICD-10-CM

## 2015-09-07 DIAGNOSIS — H6123 Impacted cerumen, bilateral: Secondary | ICD-10-CM | POA: Diagnosis not present

## 2015-09-07 MED ORDER — AZITHROMYCIN 250 MG PO TABS: ORAL_TABLET | ORAL | Status: DC

## 2015-09-07 MED ORDER — HYDROCODONE-HOMATROPINE 5-1.5 MG/5ML PO SYRP: 5.0000 mL | ORAL_SOLUTION | Freq: Three times a day (TID) | ORAL | Status: DC | PRN

## 2015-09-07 NOTE — Patient Instructions (Signed)

## 2015-09-07 NOTE — Progress Notes (Addendum)
By signing my name below, I, Moises Blood, attest that this documentation has been prepared under the direction and in the presence of Robyn Haber, MD. Electronically Signed: Moises Blood, [place]. 09/07/2015 , 1:32 PM .  Patient was seen in room 8 .   Patient ID: [name] MRN: [id number], DOB: Dec 23, 1928, 82 y.o. Date of Encounter: 09/07/2015  Primary Physician: Mathews Argyle, MD  Chief Complaint:  Chief Complaint  Patient presents with  . Cough    X Tuesday  . Fatigue    X Wednesday    HPI:  [name] is a 82 y.o. female who presents to [facility] complaining of productive cough that started about 5 days ago. She states that she started feeling sick about a week ago after lunch. She thought it was something she ate. She didn't feel like she had a cold and denies fever at the time. She felt nauseous with vomiting. Then, about 5 days ago, she had productive coughs. She denies blood in phlegm, shortness of breath and chest pain.   She lives alone at the [facility].   Past Medical History  Diagnosis Date  . Hypertension   . CAD (coronary artery disease)     a. Mod prox LAD & first diagonal stenosis by cath 2002 - managed medically since that time with normal LV function.  . Hyperlipidemia   . Arthritis   . Cataract      Home Meds: Prior to Admission medications   Medication Sig Start Date End Date Taking? Authorizing Provider  aspirin EC 325 MG tablet Take 325 mg by mouth daily.   Yes Historical Provider, MD  cholecalciferol (VITAMIN D) 1000 UNITS tablet Take 1,000 Units by mouth every morning.   Yes Historical Provider, MD  ferrous sulfate 325 (65 FE) MG tablet Take 325 mg by mouth daily with breakfast.   Yes Historical Provider, MD  fish oil-omega-3 fatty acids 1000 MG capsule Take 2 g by mouth daily after lunch.   Yes Historical Provider, MD  guaiFENesin (ROBITUSSIN) 100 MG/5ML liquid Take 200 mg by mouth 3 (three) times daily  as needed for cough.   Yes Historical Provider, MD  hydrochlorothiazide (HYDRODIURIL) 25 MG tablet Take 25 mg by mouth daily.   Yes Historical Provider, MD  isosorbide mononitrate (IMDUR) 60 MG 24 hr tablet TAKE 1 TABLET BY MOUTH DAILY 07/28/15  Yes Belva Crome, MD  Multiple Vitamin (MULTIVITAMIN WITH MINERALS) TABS Take 1 tablet by mouth daily after lunch.   Yes Historical Provider, MD  Polyethyl Glycol-Propyl Glycol (SYSTANE OP) Place 1 drop into the right eye 2 (two) times daily.    Yes Historical Provider, MD  acetaminophen (TYLENOL) 500 MG tablet Take 500 mg by mouth every 6 (six) hours as needed for mild pain or headache. Reported on 09/07/2015    Historical Provider, MD  ALPRAZolam Duanne Moron) 0.5 MG tablet Take 0.25-0.5 mg by mouth at bedtime as needed for sleep. Reported on 09/07/2015    Historical Provider, MD  colchicine 0.6 MG tablet Take 1 tablet (0.6 mg total) by mouth daily. Take 2 tablets by mouth, then 1 tablet, one hour later for acute gout attack, then one daily for acute gout flare Patient not taking: Reported on 09/07/2015 08/17/14   Ernestina Patches, MD  feeding supplement, RESOURCE BREEZE, (RESOURCE BREEZE) LIQD Take 1 Container by mouth daily. Patient not taking: Reported on 09/07/2015 08/03/13   Velta Addison Mikhail, DO  ranitidine (ZANTAC) 150 MG tablet Take  150 mg by mouth daily as needed for heartburn. Reported on 09/07/2015    Historical Provider, MD    Allergies:  Allergies  Allergen Reactions  . Felodipine     Reaction: unknown possible nausea or rash  . Omeprazole     Reaction: unknown possible nausea or rash  . Shrimp [Shellfish Allergy] Nausea And Vomiting    Social History   Social History  . Marital Status: Widowed    Spouse Name: N/A  . Number of Children: N/A  . Years of Education: N/A   Occupational History  . Not on file.   Social History Main Topics  . Smoking status: Never Smoker   . Smokeless tobacco: Never Used  . Alcohol Use: No  . Drug Use: No  .  Sexual Activity: Not on file   Other Topics Concern  . Not on file   Social History Narrative     Review of Systems: Constitutional: negative for fever, chills, night sweats, weight changes; positive for fatigue  HEENT: negative for vision changes, hearing loss, rhinorrhea, ST, epistaxis, or sinus pressure; positive for congestion Cardiovascular: negative for chest pain or palpitations Respiratory: negative for hemoptysis, shortness of breath; positive for cough, wheezing Abdominal: negative for abdominal pain, nausea, vomiting, diarrhea, or constipation Dermatological: negative for rash Neurologic: negative for headache, dizziness, or syncope All other systems reviewed and are otherwise negative with the exception to those above and in the HPI.  Physical Exam: Blood pressure 146/74, pulse 67, temperature 98 F (36.7 C), temperature source Oral, resp. rate 17, height 5\' 3"  (1.6 m), weight 170 lb (77.111 kg), SpO2 94 %., Body mass index is 30.12 kg/(m^2). General: Well developed, well nourished, in no acute distress. Head: Normocephalic, atraumatic, eyes without discharge, sclera non-icteric, nares are without discharge. TM's are without perforation, pearly grey and translucent with reflective cone of light bilaterally. Oral cavity moist, posterior pharynx without exudate, erythema, peritonsillar abscess, or post nasal drip; cerumen impaction bilaterally Neck: Supple. No thyromegaly. Full ROM. No lymphadenopathy. Lungs: Clear bilaterally to auscultation without rales, or rhonchi. Breathing is unlabored. Expiratory wheezing bilaterally Heart: RRR with S1 S2. No murmurs, rubs, or gallops appreciated. Abdomen: Soft, non-tender, non-distended with normoactive bowel sounds. No hepatomegaly. No rebound/guarding. No obvious abdominal masses. Msk:  Strength and tone normal for age. Extremities/Skin: Warm and dry. No clubbing or cyanosis. No edema. No rashes or suspicious lesions. Neuro: Alert and  oriented X 3. Moves all extremities spontaneously. Gait is normal. CNII-XII grossly in tact. Psych:  Responds to questions appropriately with a normal affect.   [facility] reading (PRIMARY) by Dr. Joseph Art : chest xray: no definite infiltrate, some increased bronchial markings  ASSESSMENT AND PLAN:  82 y.o. year old female with  This chart was scribed in my presence and reviewed by me personally.    ICD-9-CM ICD-10-CM   1. Cough 786.2 R05 DG Chest 2 View     azithromycin (ZITHROMAX) 250 MG tablet     HYDROcodone-homatropine (HYCODAN) 5-1.5 MG/5ML syrup  2. Cerumen impaction, bilateral 380.4 H61.23 Ear wax removal     Signed, Robyn Haber, MD     Signed, Robyn Haber, MD 09/07/2015 1:53 PM

## 2015-09-11 DIAGNOSIS — J209 Acute bronchitis, unspecified: Secondary | ICD-10-CM | POA: Diagnosis not present

## 2015-09-18 DIAGNOSIS — G4700 Insomnia, unspecified: Secondary | ICD-10-CM | POA: Diagnosis not present

## 2015-09-18 DIAGNOSIS — N183 Chronic kidney disease, stage 3 (moderate): Secondary | ICD-10-CM | POA: Diagnosis not present

## 2015-09-18 DIAGNOSIS — I129 Hypertensive chronic kidney disease with stage 1 through stage 4 chronic kidney disease, or unspecified chronic kidney disease: Secondary | ICD-10-CM | POA: Diagnosis not present

## 2015-09-18 DIAGNOSIS — R05 Cough: Secondary | ICD-10-CM | POA: Diagnosis not present

## 2015-10-23 DIAGNOSIS — L814 Other melanin hyperpigmentation: Secondary | ICD-10-CM | POA: Diagnosis not present

## 2015-10-23 DIAGNOSIS — L570 Actinic keratosis: Secondary | ICD-10-CM | POA: Diagnosis not present

## 2015-10-24 ENCOUNTER — Other Ambulatory Visit: Payer: Self-pay | Admitting: Interventional Cardiology

## 2015-11-04 DIAGNOSIS — H47293 Other optic atrophy, bilateral: Secondary | ICD-10-CM | POA: Diagnosis not present

## 2015-11-04 DIAGNOSIS — H40013 Open angle with borderline findings, low risk, bilateral: Secondary | ICD-10-CM | POA: Diagnosis not present

## 2015-11-12 ENCOUNTER — Ambulatory Visit
Admission: RE | Admit: 2015-11-12 | Discharge: 2015-11-12 | Disposition: A | Discharge status: Home / Self Care | Payer: Medicare Other | Source: Ambulatory Visit | Attending: Internal Medicine | Admitting: Internal Medicine

## 2015-11-12 ENCOUNTER — Other Ambulatory Visit: Payer: Self-pay | Admitting: Internal Medicine

## 2015-11-12 DIAGNOSIS — R0602 Shortness of breath: Secondary | ICD-10-CM | POA: Diagnosis not present

## 2015-11-12 DIAGNOSIS — R059 Cough, unspecified: Secondary | ICD-10-CM

## 2015-11-12 DIAGNOSIS — R05 Cough: Secondary | ICD-10-CM | POA: Diagnosis not present

## 2015-11-17 ENCOUNTER — Encounter: Payer: Self-pay | Admitting: Interventional Cardiology

## 2015-11-17 ENCOUNTER — Ambulatory Visit (INDEPENDENT_AMBULATORY_CARE_PROVIDER_SITE_OTHER): Payer: Medicare Other | Admitting: Interventional Cardiology

## 2015-11-17 VITALS — BP 184/88 | HR 59 | Ht 63.0 in | Wt 166.2 lb

## 2015-11-17 DIAGNOSIS — I25119 Atherosclerotic heart disease of native coronary artery with unspecified angina pectoris: Secondary | ICD-10-CM | POA: Diagnosis not present

## 2015-11-17 DIAGNOSIS — E785 Hyperlipidemia, unspecified: Secondary | ICD-10-CM | POA: Diagnosis not present

## 2015-11-17 DIAGNOSIS — I10 Essential (primary) hypertension: Secondary | ICD-10-CM | POA: Diagnosis not present

## 2015-11-17 NOTE — Patient Instructions (Addendum)
Medication Instructions:  Your physician recommends that you continue on your current medications as directed. Please refer to the Current Medication list given to you today.   Labwork: None ordred  Testing/Procedures: None ordered  Follow-Up: Your physician recommends that you schedule a follow-up appointment as needed   Any Other Special Instructions Will Be Listed Below (If Applicable).     If you need a refill on your cardiac medications before your next appointment, please call your pharmacy.

## 2015-11-17 NOTE — Progress Notes (Signed)
Cardiology Office Note   Date:  11/17/2015   ID:  [name], DOB 06/14/1929, MRN [id number]  PCP:  Mathews Argyle, MD  Cardiologist:  Sinclair Grooms, MD   Chief Complaint  Patient presents with  . Coronary Artery Disease      History of Present Illness: [name] is a 82 y.o. female who presents for Coronary disease involving moderate first diagonal and LAD and 2002 by cath, and hypertension. Also has a history of hyperlipidemia.  Now elderly, she ambulates with a walker. She denies chest discomfort. There is no orthopnea or PND. No lower extremity swelling is been noted. She denies palpitations.    Past Medical History  Diagnosis Date  . Hypertension   . CAD (coronary artery disease)     a. Mod prox LAD & first diagonal stenosis by cath 2002 - managed medically since that time with normal LV function.  . Hyperlipidemia   . Arthritis   . Cataract     Past Surgical History  Procedure Laterality Date  . Back surgery    . Abdominal hysterectomy    . Knee surgery    . Hernia repair    . Shoulder surgery    . Carpal tunnel release    . Cholecystectomy N/A 06/19/2013    Procedure: LAPAROSCOPIC CHOLECYSTECTOMY;  Surgeon: Harl Bowie, MD;  Location: [facility];  Service: General;  Laterality: N/A;  . Splenectomy, total    . Appendectomy       Current Outpatient Prescriptions  Medication Sig Dispense Refill  . acetaminophen (TYLENOL) 500 MG tablet Take 500 mg by mouth every 6 (six) hours as needed for mild pain or headache. Reported on 09/07/2015    . ALPRAZolam (XANAX) 0.5 MG tablet Take 0.25-0.5 mg by mouth at bedtime as needed for sleep. Reported on 09/07/2015    . aspirin EC 325 MG tablet Take 325 mg by mouth daily.    . Cholecalciferol (VITAMIN D3) 2000 units TABS Take 2,000 Units by mouth daily.    . ferrous sulfate 325 (65 FE) MG tablet Take 325 mg by mouth daily with breakfast.    . fish oil-omega-3 fatty acids 1000 MG capsule Take 2 g by  mouth daily after lunch.    . hydrochlorothiazide (HYDRODIURIL) 25 MG tablet Take 25 mg by mouth daily.    . isosorbide mononitrate (IMDUR) 60 MG 24 hr tablet TAKE 1 TABLET BY MOUTH DAILY 90 tablet 0  . Multiple Vitamin (MULTIVITAMIN WITH MINERALS) TABS Take 1 tablet by mouth daily after lunch.    Vladimir Faster Glycol-Propyl Glycol (SYSTANE OP) Place 1 drop into both eyes 2 (two) times daily.     . ranitidine (ZANTAC) 150 MG tablet Take 150 mg by mouth daily as needed for heartburn. Reported on 09/07/2015     No current facility-administered medications for this visit.    Allergies:   Felodipine; Omeprazole; and Shrimp    Social History:  The patient  reports that she has never smoked. She has never used smokeless tobacco. She reports that she does not drink alcohol or use illicit drugs.   Family History:  The patient's family history includes Cancer in her mother and sister; Heart disease in her mother; Stroke in her father.    ROS:  Please see the history of present illness.   Otherwise, review of systems are positive for Out of energy all the time. Denies transient neurological symptoms. No blood in the urine or stool.Marland Kitchen  All other systems are reviewed and negative.    PHYSICAL EXAM: VS:  BP 184/88 mmHg  Pulse 59  Ht 5\' 3"  (1.6 m)  Wt 166 lb 3.2 oz (75.388 kg)  BMI 29.45 kg/m2 , BMI Body mass index is 29.45 kg/(m^2). GEN: Well nourished, well developed, in no acute distress HEENT: normal Neck: no JVD, carotid bruits, or masses Cardiac: RRR.  There is no murmur, rub, or gallop. There is no edema. Respiratory:  clear to auscultation bilaterally, normal work of breathing. GI: soft, nontender, nondistended, + BS MS: no deformity or atrophy Skin: warm and dry, no rash Neuro:  Strength and sensation are intact Psych: euthymic mood, full affect   EKG:  EKG is ordered today. The ekg reveals normal sinus rhythm, rightward axis, essentially normal tracing for age.   Recent Labs: No  results found for requested labs within last 365 days.    Lipid Panel    Component Value Date/Time   CHOL 199 06/18/2013 0614   TRIG 70 06/18/2013 0614   HDL 67 06/18/2013 0614   CHOLHDL 3.0 06/18/2013 0614   VLDL 14 06/18/2013 0614   LDLCALC 118* 06/18/2013 0614      Wt Readings from Last 3 Encounters:  11/17/15 166 lb 3.2 oz (75.388 kg)  09/07/15 170 lb (77.111 kg)  05/20/14 184 lb 6.4 oz (83.643 kg)      Other studies Reviewed: Additional studies/ records that were reviewed today include: Last office note.. The findings include no new or helpful data.    ASSESSMENT AND PLAN:  1. Coronary artery disease involving native coronary artery of native heart with angina pectoris ([place]) Asymptomatic  2. Essential hypertension Repeat blood pressure 138/72  3. Hyperlipidemia Followed by primary care    Current medicines are reviewed at length with the patient today.  The patient has the following concerns regarding medicines: None.  The following changes/actions have been instituted:    She has been out for 2 weeks and has had no symptoms. We will therefore discontinue isosorbide.  Labs/ tests ordered today include:  No orders of the defined types were placed in this encounter.     Disposition:   FU with HS in as needed   Signed, Sinclair Grooms, MD  11/17/2015 11:35 AM    [address] [place], [place], [place]  [address] Phone: [phone]; Fax: [phone]

## 2015-12-11 DIAGNOSIS — Z1389 Encounter for screening for other disorder: Secondary | ICD-10-CM | POA: Diagnosis not present

## 2015-12-11 DIAGNOSIS — I129 Hypertensive chronic kidney disease with stage 1 through stage 4 chronic kidney disease, or unspecified chronic kidney disease: Secondary | ICD-10-CM | POA: Diagnosis not present

## 2015-12-11 DIAGNOSIS — Z6830 Body mass index (BMI) 30.0-30.9, adult: Secondary | ICD-10-CM | POA: Diagnosis not present

## 2015-12-11 DIAGNOSIS — D649 Anemia, unspecified: Secondary | ICD-10-CM | POA: Diagnosis not present

## 2015-12-11 DIAGNOSIS — E669 Obesity, unspecified: Secondary | ICD-10-CM | POA: Diagnosis not present

## 2015-12-11 DIAGNOSIS — R350 Frequency of micturition: Secondary | ICD-10-CM | POA: Diagnosis not present

## 2015-12-11 DIAGNOSIS — N183 Chronic kidney disease, stage 3 (moderate): Secondary | ICD-10-CM | POA: Diagnosis not present

## 2015-12-11 DIAGNOSIS — K439 Ventral hernia without obstruction or gangrene: Secondary | ICD-10-CM | POA: Diagnosis not present

## 2015-12-11 DIAGNOSIS — Z0000 Encounter for general adult medical examination without abnormal findings: Secondary | ICD-10-CM | POA: Diagnosis not present

## 2015-12-11 DIAGNOSIS — K5901 Slow transit constipation: Secondary | ICD-10-CM | POA: Diagnosis not present

## 2015-12-11 DIAGNOSIS — Z79899 Other long term (current) drug therapy: Secondary | ICD-10-CM | POA: Diagnosis not present

## 2016-01-28 DIAGNOSIS — L814 Other melanin hyperpigmentation: Secondary | ICD-10-CM | POA: Diagnosis not present

## 2016-01-28 DIAGNOSIS — L570 Actinic keratosis: Secondary | ICD-10-CM | POA: Diagnosis not present

## 2016-02-26 ENCOUNTER — Other Ambulatory Visit: Payer: Self-pay | Admitting: Geriatric Medicine

## 2016-02-26 DIAGNOSIS — R1030 Lower abdominal pain, unspecified: Secondary | ICD-10-CM | POA: Diagnosis not present

## 2016-02-26 DIAGNOSIS — N183 Chronic kidney disease, stage 3 (moderate): Secondary | ICD-10-CM | POA: Diagnosis not present

## 2016-02-26 DIAGNOSIS — I129 Hypertensive chronic kidney disease with stage 1 through stage 4 chronic kidney disease, or unspecified chronic kidney disease: Secondary | ICD-10-CM | POA: Diagnosis not present

## 2016-03-03 ENCOUNTER — Ambulatory Visit
Admission: RE | Admit: 2016-03-03 | Discharge: 2016-03-03 | Disposition: A | Discharge status: Home / Self Care | Payer: Medicare Other | Source: Ambulatory Visit | Attending: Geriatric Medicine | Admitting: Geriatric Medicine

## 2016-03-03 DIAGNOSIS — R1030 Lower abdominal pain, unspecified: Secondary | ICD-10-CM | POA: Diagnosis not present

## 2016-03-03 MED ORDER — IOPAMIDOL (ISOVUE-300) INJECTION 61%
100.0000 mL | Freq: Once | INTRAVENOUS | Status: AC | PRN
  Administered 2016-03-03: 100 mL via INTRAVENOUS

## 2016-03-10 DIAGNOSIS — R160 Hepatomegaly, not elsewhere classified: Secondary | ICD-10-CM | POA: Diagnosis not present

## 2016-03-16 ENCOUNTER — Other Ambulatory Visit: Payer: Self-pay | Admitting: Surgery

## 2016-03-16 DIAGNOSIS — R160 Hepatomegaly, not elsewhere classified: Secondary | ICD-10-CM

## 2016-03-18 ENCOUNTER — Other Ambulatory Visit: Payer: Self-pay | Admitting: Interventional Radiology

## 2016-03-18 ENCOUNTER — Ambulatory Visit
Admission: RE | Admit: 2016-03-18 | Discharge: 2016-03-18 | Disposition: A | Discharge status: Home / Self Care | Payer: Medicare Other | Source: Ambulatory Visit | Attending: Surgery | Admitting: Surgery

## 2016-03-18 ENCOUNTER — Other Ambulatory Visit (HOSPITAL_COMMUNITY): Payer: Self-pay | Admitting: Interventional Radiology

## 2016-03-18 DIAGNOSIS — R160 Hepatomegaly, not elsewhere classified: Secondary | ICD-10-CM

## 2016-03-18 DIAGNOSIS — R1907 Generalized intra-abdominal and pelvic swelling, mass and lump: Secondary | ICD-10-CM | POA: Diagnosis not present

## 2016-03-18 HISTORY — PX: IR GENERIC HISTORICAL: IMG1180011

## 2016-03-18 NOTE — Consult Note (Signed)
Chief Complaint: I have a spot in my abdomen.   Referring Physician(s): Martin,Matthew  History of Present Illness: [name] is a 82 y.o. female presenting to vascular and interventional radiology clinic today for evaluation of image guided biopsy.  She has had abdominal CT performed for abdominal pain, with the result 03/03/2016. There is soft tissue anterior to the left liver lobe of uncertain etiology, and, although she has not been diagnosed with any prior carcinoma/cancer, could potentially represent omental metastases.  She reports tenderness in this area, although this does not keep her awake at night. It is worst on palpation and she says that she can feel the spot in her belly. She is unable to feel at this time.  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 I think the differential diagnosis on the CT scan would be splenosis/splenic tissue.  She has not had any recent hospitalizations.  Past Medical History:  Diagnosis Date  . Arthritis   . CAD (coronary artery disease)    a. Mod prox LAD & first diagonal stenosis by cath 2002 - managed medically since that time with normal LV function.  . Cataract   . Hyperlipidemia   . Hypertension     Past Surgical History:  Procedure Laterality Date  . ABDOMINAL HYSTERECTOMY    . APPENDECTOMY    . BACK SURGERY    . CARPAL TUNNEL RELEASE    . CHOLECYSTECTOMY N/A 06/19/2013   Procedure: LAPAROSCOPIC CHOLECYSTECTOMY;  Surgeon: Harl Bowie, MD;  Location: [facility];  Service: General;  Laterality: N/A;  . [place]    . SHOULDER SURGERY    . SPLENECTOMY, TOTAL      Allergies: Felodipine; Omeprazole; and Shrimp [shellfish allergy]  Medications: Prior to Admission medications   Medication Sig Start Date End Date Taking? Authorizing Provider  acetaminophen (TYLENOL) 500 MG tablet Take 500 mg by mouth every 6 (six) hours as needed for mild  pain or headache. Reported on 09/07/2015   Yes Historical Provider, MD  ALPRAZolam Duanne Moron) 0.5 MG tablet Take 0.25-0.5 mg by mouth at bedtime as needed for sleep. Reported on 09/07/2015   Yes Historical Provider, MD  aspirin EC 325 MG tablet Take 325 mg by mouth daily.   Yes Historical Provider, MD  BIOTIN PO Take 1,500 mg by mouth daily.   Yes Historical Provider, MD  Cholecalciferol (VITAMIN D3) 2000 units TABS Take 2,000 Units by mouth daily.   Yes Historical Provider, MD  ferrous sulfate 325 (65 FE) MG tablet Take 325 mg by mouth daily with breakfast.   Yes Historical Provider, MD  fish oil-omega-3 fatty acids 1000 MG capsule Take 2 g by mouth daily after lunch.   Yes Historical Provider, MD  hydrochlorothiazide (HYDRODIURIL) 25 MG tablet Take 25 mg by mouth daily.   Yes Historical Provider, MD  Multiple Vitamin (MULTIVITAMIN WITH MINERALS) TABS Take 1 tablet by mouth daily after lunch.   Yes Historical Provider, MD  Polyethyl Glycol-Propyl Glycol (SYSTANE OP) Place 1 drop into both eyes 2 (two) times daily.     Historical Provider, MD  ranitidine (ZANTAC) 150 MG tablet Take 150 mg by mouth daily as needed for heartburn. Reported on 09/07/2015    Historical Provider, MD     Family History  Problem Relation Age of Onset  . Heart disease Mother     "enlarged valve"  . Cancer Mother  Ovarian  . Stroke Father   . Cancer Sister     Colon    Social History   Social History  . Marital status: Widowed    Spouse name: N/A  . Number of children: N/A  . Years of education: N/A   Social History Main Topics  . Smoking status: Never Smoker  . Smokeless tobacco: Never Used  . Alcohol use No  . Drug use: No  . Sexual activity: Not on file   Other Topics Concern  . Not on file   Social History Narrative  . No narrative on file     Review of Systems: A 12 point ROS discussed and pertinent positives are indicated in the HPI above.  All other systems are negative.  Review of  Systems  Vital Signs: BP (!) 149/68 (BP Location: Left Arm, Patient Position: Sitting, Cuff Size: Normal)   Pulse 65   Temp 98 F (36.7 C) (Oral)   Resp 16   Ht 5\' 3"  (1.6 m)   Wt 167 lb (75.8 kg)   SpO2 98%   BMI 29.58 kg/m   Physical Exam Atraumatic normocephalic mucous membranes moist pink. Conjugate gaze. No scleral icterus or scleral injection. Full cervical motion. Lungs clear to auscultation. Symmetric excursion on inspiration and expiration. Abdomen soft nontender nondistended. I cannot palpate this spot in her upper abdomen. No peritoneal signs. Genitourinary deferred. Moving all 4 extremities grossly, with gross sensory intact.  Mallampati Score:  3  Imaging: Ct Abdomen Pelvis W Contrast  Result Date: 03/03/2016 CLINICAL DATA:  Mid to low abdomen pain for several months, nausea and vomiting EXAM: CT ABDOMEN AND PELVIS WITH CONTRAST TECHNIQUE: Multidetector CT imaging of the abdomen and pelvis was performed using the standard protocol following bolus administration of intravenous contrast. CONTRAST:  1104mL ISOVUE-300 IOPAMIDOL (ISOVUE-300) INJECTION 61% COMPARISON:  CT abdomen pelvis of 07/29/2004 FINDINGS: The lung bases are clear. However the previously small hiatal hernia has enlarged now being moderate in size. The liver enhances with no focal abnormality and no ductal dilatation is seen. The gallbladder has previously been resected. Just anterior to the left lobe of liver in within the peritoneal cavity there is a lobular soft tissue structure present of 3.7 x 1.6 x 3.4 cm with attenuation of 57 HU. This lobular soft tissue mass was not present on the prior CT and this could represent adenopathy or possibly peritoneal carcinomatosis. No additional nodular opacities however are seen throughout the peritoneal cavity a small enhancing nodular opacity high in the left upper quadrant posteriorly abutting the hemidiaphragm was present previously and has not changed, consistent  with a benign process. The right adrenal gland is unremarkable. A lobular higher attenuation lesion emanates from the left adrenal gland some which was present previously. This most likely is a benign process such as an atypical adenoma in view of the lack of significant interval change. The spleen has been previously resected. The stomach is moderately distended with contrast with no additional abnormality noted other than the previously described hiatal hernia. The kidneys enhance and there are bilateral renal cysts present. No solid renal mass is noted and on delayed images, there is no evidence of hydronephrosis. Atherosclerotic change is noted involving the abdominal aorta. A midline lower abdomen- upper pelvic ventral hernia is present containing fat and nondilated loop of small bowel. The urinary bladder is not well distended. The uterus has previously been resected. No adnexal lesion is seen. No free fluid is noted within the pelvis. There are  scattered rectosigmoid colon diverticula present. Terminal ileum is unremarkable. The appendix has previously been resected by history. There is diffuse degenerative change throughout the lumbar spine. Ray cage fusion is noted at L2-3 and L3-4 levels. IMPRESSION: 1. Interval increase in size of a now moderate sized hiatal hernia compared to the CT from 2005. 2. Midline ventral hernia containing fat and a nondilated loop with small bowel. 3. Lobular soft tissue mass anteriorly within the peritoneal cavity anterior to the liver of uncertain significance. This could represent adenopathy although in the proper clinical setting peritoneal carcinomatosis cannot be excluded. 4. Rectosigmoid colon diverticula. Electronically Signed   By: Ivar Drape M.D.   On: 03/03/2016 14:35    Labs:  CBC: No results for input(s): WBC, HGB, HCT, PLT in the last 8760 hours.  COAGS: No results for input(s): INR, APTT in the last 8760 hours.  BMP: No results for input(s): NA, K, CL,  CO2, GLUCOSE, BUN, CALCIUM, CREATININE, GFRNONAA, GFRAA in the last 8760 hours.  Invalid input(s): CMP  LIVER FUNCTION TESTS: No results for input(s): BILITOT, AST, ALT, ALKPHOS, PROT, ALBUMIN in the last 8760 hours.  TUMOR MARKERS: No results for input(s): AFPTM, CEA, CA199, CHROMGRNA in the last 8760 hours.  Assessment and Plan:  82 year old female with a history of abnormal soft tissue in the upper abdomen anterior to the left liver lobe. Differential does include peritoneal carcinomatosis, although she has no history of a known malignancy. I think the differential diagnosis would also be for splenosis.  An image guided biopsy is relatively low risk and reasonable.  I discussed with her options for image guided biopsy, and I believe ultrasound is reasonable given the location.  She would like to proceed with the biopsy.  Thank you for this interesting consult.  I greatly enjoyed meeting [name] and look forward to participating in their care.  A copy of this report was sent to the requesting provider on this date.  Electronically Signed: Corrie Mckusick 03/18/2016, 11:57 AM   I spent a total of  15 Minutes   in face to face in clinical consultation, greater than 50% of which was counseling/coordinating care for US guided biopsy of upper abdominal mass.

## 2016-03-26 ENCOUNTER — Other Ambulatory Visit: Payer: Self-pay | Admitting: Radiology

## 2016-03-29 ENCOUNTER — Encounter (HOSPITAL_COMMUNITY): Payer: Self-pay

## 2016-03-29 ENCOUNTER — Ambulatory Visit (HOSPITAL_COMMUNITY)
Admission: RE | Admit: 2016-03-29 | Discharge: 2016-03-29 | Disposition: A | Discharge status: Home / Self Care | Payer: Medicare Other | Source: Ambulatory Visit | Attending: Interventional Radiology | Admitting: Interventional Radiology

## 2016-03-29 DIAGNOSIS — M7989 Other specified soft tissue disorders: Secondary | ICD-10-CM | POA: Diagnosis not present

## 2016-03-29 DIAGNOSIS — R1900 Intra-abdominal and pelvic swelling, mass and lump, unspecified site: Secondary | ICD-10-CM | POA: Insufficient documentation

## 2016-03-29 DIAGNOSIS — Z9049 Acquired absence of other specified parts of digestive tract: Secondary | ICD-10-CM | POA: Insufficient documentation

## 2016-03-29 DIAGNOSIS — D696 Thrombocytopenia, unspecified: Secondary | ICD-10-CM | POA: Insufficient documentation

## 2016-03-29 DIAGNOSIS — C9020 Extramedullary plasmacytoma not having achieved remission: Secondary | ICD-10-CM | POA: Diagnosis not present

## 2016-03-29 DIAGNOSIS — E785 Hyperlipidemia, unspecified: Secondary | ICD-10-CM | POA: Diagnosis not present

## 2016-03-29 DIAGNOSIS — Z7982 Long term (current) use of aspirin: Secondary | ICD-10-CM | POA: Diagnosis not present

## 2016-03-29 DIAGNOSIS — Z9081 Acquired absence of spleen: Secondary | ICD-10-CM | POA: Insufficient documentation

## 2016-03-29 DIAGNOSIS — Z91013 Allergy to seafood: Secondary | ICD-10-CM | POA: Insufficient documentation

## 2016-03-29 DIAGNOSIS — I2510 Atherosclerotic heart disease of native coronary artery without angina pectoris: Secondary | ICD-10-CM | POA: Insufficient documentation

## 2016-03-29 DIAGNOSIS — Z90710 Acquired absence of both cervix and uterus: Secondary | ICD-10-CM | POA: Diagnosis not present

## 2016-03-29 DIAGNOSIS — Z888 Allergy status to other drugs, medicaments and biological substances status: Secondary | ICD-10-CM | POA: Insufficient documentation

## 2016-03-29 DIAGNOSIS — R160 Hepatomegaly, not elsewhere classified: Secondary | ICD-10-CM

## 2016-03-29 DIAGNOSIS — K668 Other specified disorders of peritoneum: Secondary | ICD-10-CM | POA: Diagnosis not present

## 2016-03-29 DIAGNOSIS — M1990 Unspecified osteoarthritis, unspecified site: Secondary | ICD-10-CM | POA: Diagnosis not present

## 2016-03-29 DIAGNOSIS — I10 Essential (primary) hypertension: Secondary | ICD-10-CM | POA: Insufficient documentation

## 2016-03-29 LAB — CBC
HCT: 36.8 % (ref 36.0–46.0)
Hemoglobin: 12.2 g/dL (ref 12.0–15.0)
MCH: 31.4 pg (ref 26.0–34.0)
MCHC: 33.2 g/dL (ref 30.0–36.0)
MCV: 94.8 fL (ref 78.0–100.0)
Platelets: 302 10*3/uL (ref 150–400)
RBC: 3.88 MIL/uL (ref 3.87–5.11)
RDW: 15.4 % (ref 11.5–15.5)
WBC: 6.5 10*3/uL (ref 4.0–10.5)

## 2016-03-29 LAB — APTT: aPTT: 29 seconds (ref 24–36)

## 2016-03-29 LAB — PROTIME-INR
INR: 1.01
Prothrombin Time: 13.3 seconds (ref 11.4–15.2)

## 2016-03-29 MED ORDER — FENTANYL CITRATE (PF) 100 MCG/2ML IJ SOLN
INTRAMUSCULAR | Status: AC
  Filled 2016-03-29: qty 4

## 2016-03-29 MED ORDER — FENTANYL CITRATE (PF) 100 MCG/2ML IJ SOLN
INTRAMUSCULAR | Status: AC | PRN
  Administered 2016-03-29: 50 ug via INTRAVENOUS

## 2016-03-29 MED ORDER — SODIUM CHLORIDE 0.9 % IV SOLN: INTRAVENOUS | Status: DC

## 2016-03-29 MED ORDER — MIDAZOLAM HCL 2 MG/2ML IJ SOLN
INTRAMUSCULAR | Status: AC | PRN
  Administered 2016-03-29 (×3): 1 mg via INTRAVENOUS

## 2016-03-29 MED ORDER — MIDAZOLAM HCL 2 MG/2ML IJ SOLN
INTRAMUSCULAR | Status: AC
  Filled 2016-03-29: qty 6

## 2016-03-29 NOTE — Progress Notes (Signed)
Patient ID: [name], female   DOB: 01/26/1929, 82 y.o.   MRN: [id number]    Referring Physician(s): Stoneking,Hal  Supervising Physician: Arne Cleveland  Patient Status:  Outpatient  Chief Complaint: abdominal mass  Subjective: Patient familiar to IR service from recent consultation with Dr. Earleen Newport on 03/18/16 for consideration of biopsy of abdominal mass noted on recent imaging. Please see his note for details of present illness. CT of the abdomen and pelvis on 03/03/16 revealed a lobular soft tissue mass anteriorly within the peritoneal cavity and anterior to liver. She denies history of malignancy. She has had prior splenectomy for thrombocytopenia per patient. She was deemed an appropriate candidate for biopsy of the abdominal mass and presents today for the procedure. She currently denies fever, chest pain, dyspnea, cough, back pain, vomiting or abnormal bleeding. She has had abdominal pain as well as intermittent nausea.  Past Medical History:  Diagnosis Date  . Arthritis   . CAD (coronary artery disease)    a. Mod prox LAD & first diagonal stenosis by cath 2002 - managed medically since that time with normal LV function.  . Cataract   . Hyperlipidemia   . Hypertension    Past Surgical History:  Procedure Laterality Date  . ABDOMINAL HYSTERECTOMY    . APPENDECTOMY    . BACK SURGERY    . CARPAL TUNNEL RELEASE    . CHOLECYSTECTOMY N/A 06/19/2013   Procedure: LAPAROSCOPIC CHOLECYSTECTOMY;  Surgeon: Harl Bowie, MD;  Location: [facility];  Service: General;  Laterality: N/A;  . [place]    . SHOULDER SURGERY    . SPLENECTOMY, TOTAL       Allergies: Felodipine; Omeprazole; and Shrimp [shellfish allergy]  Medications: Prior to Admission medications   Medication Sig Start Date End Date Taking? Authorizing Provider  acetaminophen (TYLENOL) 500 MG tablet Take 500 mg by mouth every 6 (six) hours as needed for mild pain or headache. Reported on  09/07/2015    Historical Provider, MD  ALPRAZolam Duanne Moron) 0.5 MG tablet Take 0.25-0.5 mg by mouth at bedtime as needed for sleep. Reported on 09/07/2015    Historical Provider, MD  aspirin EC 325 MG tablet Take 325 mg by mouth daily.    Historical Provider, MD  BIOTIN PO Take 1,500 mg by mouth daily.    Historical Provider, MD  Cholecalciferol (VITAMIN D3) 2000 units TABS Take 2,000 Units by mouth daily.    Historical Provider, MD  ferrous sulfate 325 (65 FE) MG tablet Take 325 mg by mouth daily with breakfast.    Historical Provider, MD  fish oil-omega-3 fatty acids 1000 MG capsule Take 2 g by mouth daily after lunch.    Historical Provider, MD  hydrochlorothiazide (HYDRODIURIL) 25 MG tablet Take 25 mg by mouth daily.    Historical Provider, MD  Multiple Vitamin (MULTIVITAMIN WITH MINERALS) TABS Take 1 tablet by mouth daily after lunch.    Historical Provider, MD  Polyethyl Glycol-Propyl Glycol (SYSTANE OP) Place 1 drop into both eyes 2 (two) times daily.     Historical Provider, MD  ranitidine (ZANTAC) 150 MG tablet Take 150 mg by mouth daily as needed for heartburn. Reported on 09/07/2015    Historical Provider, MD     Vital Signs: Blood pressure 170/59, temperature 98.5, heart rate 65, respirations 16   Physical Exam patient awake, alert. Chest clear to auscultation bilaterally. Heart with regular rate, occasional ectopy. Abdomen obese, soft, positive bowel sounds, mild epigastric tenderness to  palpation; lower extremities with no edema.  Imaging: No results found.  Labs:  CBC:  Recent Labs  03/29/16 1135  WBC 6.5  HGB 12.2  HCT 36.8  PLT 302    COAGS:  Recent Labs  03/29/16 1135  INR 1.01  APTT 29    BMP: No results for input(s): NA, K, CL, CO2, GLUCOSE, BUN, CALCIUM, CREATININE, GFRNONAA, GFRAA in the last 8760 hours.  Invalid input(s): CMP  LIVER FUNCTION TESTS: No results for input(s): BILITOT, AST, ALT, ALKPHOS, PROT, ALBUMIN in the last 8760  hours.  Assessment and Plan: Patient familiar to IR service from recent consultation with Dr. Earleen Newport on 03/18/16 for consideration of biopsy of abdominal mass noted on recent imaging. Please see his note for details of present illness. CT of the abdomen and pelvis on 03/03/16 revealed a lobular soft tissue mass anteriorly within the peritoneal cavity and anterior to liver. She denies history of malignancy. She has had prior splenectomy for thrombocytopenia per patient. She was deemed an appropriate candidate for biopsy of the abdominal mass and presents today for the procedure. Risks and benefits discussed with the patient/family including, but not limited to bleeding, infection, damage to adjacent structures or low yield requiring additional tests.All of the patient's questions were answered, patient is agreeable to proceed.Consent signed and in chart.     Electronically Signed: D. Rowe Robert 03/29/2016, 1:03 PM   I spent a total of 20 minutes at the the patient's bedside AND on the patient's hospital floor or unit, greater than 50% of which was counseling/coordinating care for ultrasound-guided biopsy of abdominal mass

## 2016-03-29 NOTE — Discharge Instructions (Signed)
Needle Biopsy, Care After °These instructions give you information about caring for yourself after your procedure. Your doctor may also give you more specific instructions. Call your doctor if you have any problems or questions after your procedure. °HOME CARE °· Rest as told by your doctor. °· Take medicines only as told by your doctor. °· There are many different ways to close and cover the biopsy site, including stitches (sutures), skin glue, and adhesive strips. Follow instructions from your doctor about: °¨ How to take care of your biopsy site. °¨ When and how you should change your bandage (dressing). °¨ When you should remove your dressing. °¨ Removing whatever was used to close your biopsy site. °· Check your biopsy site every day for signs of infection. Watch for: °¨ Redness, swelling, or pain. °¨ Fluid, blood, or pus. °GET HELP IF: °· You have a fever. °· You have redness, swelling, or pain at the biopsy site, and it lasts longer than a few days. °· You have fluid, blood, or pus coming from the biopsy site. °· You feel sick to your stomach (nauseous). °· You throw up (vomit). °GET HELP RIGHT AWAY IF: °· You are short of breath. °· You have trouble breathing. °· Your chest hurts. °· You feel dizzy or you pass out (faint). °· You have bleeding that does not stop with pressure or a bandage. °· You cough up blood. °· Your belly (abdomen) hurts. °  °This information is not intended to replace advice given to you by your health care provider. Make sure you discuss any questions you have with your health care provider. °  °Document Released: 07/15/2008 Document Revised: 12/17/2014 Document Reviewed: 07/29/2014 °Elsevier Interactive Patient Education ©2016 [employer] °Moderate Conscious Sedation, Adult, Care After °Refer to this sheet in the next few weeks. These instructions provide you with information on caring for yourself after your procedure. Your health care provider may also give you more specific  instructions. Your treatment has been planned according to current medical practices, but problems sometimes occur. Call your health care provider if you have any problems or questions after your procedure. °WHAT TO EXPECT AFTER THE PROCEDURE  °After your procedure: °· You may feel sleepy, clumsy, and have poor balance for several hours. °· Vomiting may occur if you eat too soon after the procedure. °HOME CARE INSTRUCTIONS °· Do not participate in any activities where you could become injured for at least 24 hours. Do not: °· Drive. °· Swim. °· Ride a bicycle. °· Operate heavy machinery. °· Cook. °· Use power tools. °· Climb ladders. °· Work from a high place. °· Do not make important decisions or sign legal documents until you are improved. °· If you vomit, drink water, juice, or soup when you can drink without vomiting. Make sure you have little or no nausea before eating solid foods. °· Only take over-the-counter or prescription medicines for pain, discomfort, or fever as directed by your health care provider. °· Make sure you and your family fully understand everything about the medicines given to you, including what side effects may occur. °· You should not drink alcohol, take sleeping pills, or take medicines that cause drowsiness for at least 24 hours. °· If you smoke, do not smoke without supervision. °· If you are feeling better, you may resume normal activities 24 hours after you were sedated. °· Keep all appointments with your health care provider. °SEEK MEDICAL CARE IF: °· Your skin is pale or bluish in color. °· You   continue to feel nauseous or vomit. °· Your pain is getting worse and is not helped by medicine. °· You have bleeding or swelling. °· You are still sleepy or feeling clumsy after 24 hours. °SEEK IMMEDIATE MEDICAL CARE IF: °· You develop a rash. °· You have difficulty breathing. °· You develop any type of allergic problem. °· You have a fever. °MAKE SURE YOU: °· Understand these  instructions. °· Will watch your condition. °· Will get help right away if you are not doing well or get worse. °  °This information is not intended to replace advice given to you by your health care provider. Make sure you discuss any questions you have with your health care provider. °  °Document Released: 05/23/2013 Document Revised: 08/23/2014 Document Reviewed: 05/23/2013 °Elsevier Interactive Patient Education ©2016 [employer] ° °

## 2016-03-29 NOTE — Procedures (Signed)
[place] core ant abd mass 18g x3 to surg path No complication No blood loss. See complete dictation in [facility].

## 2016-03-29 NOTE — Discharge Instructions (Signed)
Needle Biopsy, Care After °These instructions give you information about caring for yourself after your procedure. Your doctor may also give you more specific instructions. Call your doctor if you have any problems or questions after your procedure. °HOME CARE °· Rest as told by your doctor. °· Take medicines only as told by your doctor. °· There are many different ways to close and cover the biopsy site, including stitches (sutures), skin glue, and adhesive strips. Follow instructions from your doctor about: °¨ How to take care of your biopsy site. °¨ When and how you should change your bandage (dressing). °¨ When you should remove your dressing. °¨ Removing whatever was used to close your biopsy site. °· Check your biopsy site every day for signs of infection. Watch for: °¨ Redness, swelling, or pain. °¨ Fluid, blood, or pus. °GET HELP IF: °· You have a fever. °· You have redness, swelling, or pain at the biopsy site, and it lasts longer than a few days. °· You have fluid, blood, or pus coming from the biopsy site. °· You feel sick to your stomach (nauseous). °· You throw up (vomit). °GET HELP RIGHT AWAY IF: °· You are short of breath. °· You have trouble breathing. °· Your chest hurts. °· You feel dizzy or you pass out (faint). °· You have bleeding that does not stop with pressure or a bandage. °· You cough up blood. °· Your belly (abdomen) hurts. °  °This information is not intended to replace advice given to you by your health care provider. Make sure you discuss any questions you have with your health care provider. °  °Document Released: 07/15/2008 Document Revised: 12/17/2014 Document Reviewed: 07/29/2014 °Elsevier Interactive Patient Education ©2016 [employer] ° ° ° °Moderate Conscious Sedation, Adult °Sedation is the use of medicines to promote relaxation and relieve discomfort and anxiety. Moderate conscious sedation is a type of sedation. Under moderate conscious sedation you are less alert than  normal but are still able to respond to instructions or stimulation. Moderate conscious sedation is used during short medical and dental procedures. It is milder than deep sedation or general anesthesia and allows you to return to your regular activities sooner. °LET YOUR HEALTH CARE PROVIDER KNOW ABOUT:  °· Any allergies you have. °· All medicines you are taking, including vitamins, herbs, eye drops, creams, and over-the-counter medicines. °· Use of steroids (by mouth or creams). °· Previous problems you or members of your family have had with the use of anesthetics. °· Any blood disorders you have. °· Previous surgeries you have had. °· Medical conditions you have. °· Possibility of pregnancy, if this applies. °· Use of cigarettes, alcohol, or illegal drugs. °RISKS AND COMPLICATIONS °Generally, this is a safe procedure. However, as with any procedure, problems can occur. Possible problems include: °· Oversedation. °· Trouble breathing on your own. You may need to have a breathing tube until you are awake and breathing on your own. °· Allergic reaction to any of the medicines used for the procedure. °BEFORE THE PROCEDURE °· You may have blood tests done. These tests can help show how well your kidneys and liver are working. They can also show how well your blood clots. °· A physical exam will be done.   °· Only take medicines as directed by your health care provider. You may need to stop taking medicines (such as blood thinners, aspirin, or nonsteroidal anti-inflammatory drugs) before the procedure.   °· Do not eat or drink at least 6 hours before the procedure or   as directed by your health care provider. °· Arrange for a responsible adult, family member, or friend to take you home after the procedure. He or she should stay with you for at least 24 hours after the procedure, until the medicine has worn off. °PROCEDURE  °· An intravenous (IV) catheter will be inserted into one of your veins. Medicine will be able to  flow directly into your body through this catheter. You may be given medicine through this tube to help prevent pain and help you relax. °· The medical or dental procedure will be done. °AFTER THE PROCEDURE °· You will stay in a recovery area until the medicine has worn off. Your blood pressure and pulse will be checked.   °·  Depending on the procedure you had, you may be allowed to go home when you can tolerate liquids and your pain is under control. °  °This information is not intended to replace advice given to you by your health care provider. Make sure you discuss any questions you have with your health care provider. °  °Document Released: 04/27/2001 Document Revised: 08/23/2014 Document Reviewed: 04/09/2013 °Elsevier Interactive Patient Education ©2016 [employer] Inc[employer] ° °

## 2016-04-09 DIAGNOSIS — R160 Hepatomegaly, not elsewhere classified: Secondary | ICD-10-CM | POA: Diagnosis not present

## 2016-04-12 ENCOUNTER — Telehealth: Payer: Self-pay | Admitting: Oncology

## 2016-04-12 ENCOUNTER — Encounter: Payer: Self-pay | Admitting: Oncology

## 2016-04-12 NOTE — Telephone Encounter (Signed)
Pt confirmed appt, completed intake, mailed new pt letter, faxed referring provider date/time,

## 2016-05-04 ENCOUNTER — Telehealth: Payer: Self-pay | Admitting: Oncology

## 2016-05-04 ENCOUNTER — Ambulatory Visit (HOSPITAL_BASED_OUTPATIENT_CLINIC_OR_DEPARTMENT_OTHER): Payer: Medicare Other | Admitting: Oncology

## 2016-05-04 VITALS — BP 148/66 | HR 62 | Temp 98.3°F | Resp 18 | Ht 63.0 in | Wt 167.4 lb

## 2016-05-04 DIAGNOSIS — D7589 Other specified diseases of blood and blood-forming organs: Secondary | ICD-10-CM | POA: Diagnosis not present

## 2016-05-04 NOTE — Telephone Encounter (Signed)
Avs report and appointment schedule given to patient per 05/04/16 los. °

## 2016-05-04 NOTE — Progress Notes (Signed)
Reason for Referral: Extremities are very hematopoiesis.   HPI: 82 year old woman currently of [place] where she lived the majority of her life. She currently resides independently in a senior living apartment. She is ambulatory with the help of a walker and does drive short distances. She does have history of hyperlipidemia and hypertension as well as coronary artery disease. She had been having chronic intermittent abdominal pain for the last few months without any clear cut etiology. CT scan obtained by Dr. Felipa Eth on 03/03/2016 showed an interval increase of a hiatal hernia compared to a CT scan in 2005. A lobular soft tissue mass was noted measuring 3.7 x 1.6 x 3.4 cm within the peritoneal cavity close to the left lobe of the liver. No additional lymphadenopathy or abnormalities noted. Her spleen have been previously resected. Based on these findings seen was evaluated by Dr. Hassell Done from [facility] surgery and an ultrasound-guided biopsy obtained on 03/29/2016 showed some medullary hematopoiesis. Patient referred to me for this evaluation. Clinically she has really no other complaints. She denied any constitutional symptoms of fevers or chills or sweats. Her appetite have been excellent and have not lost any weight. She continues to has reasonable performance status. She continues to have intermittent abdominal pain that is rather vague and associated with some occasional constipation.  She does not report any headaches, blurry vision, syncope or seizures. She does not report any fevers, chills or sweats. She does not report any cough, wheezing or hemoptysis. She does not report any chest pain, palpitation, orthopnea or leg edema. She does not report any nausea, vomiting hematochezia or melena. She does not report any frequency urgency or hesitancy. She does not report any skeletal complaints. Remaining review of systems unremarkable.   Past Medical History:  Diagnosis Date  . Arthritis   . CAD  (coronary artery disease)    a. Mod prox LAD & first diagonal stenosis by cath 2002 - managed medically since that time with normal LV function.  . Cataract   . Hyperlipidemia   . Hypertension   :  Past Surgical History:  Procedure Laterality Date  . ABDOMINAL HYSTERECTOMY    . APPENDECTOMY    . BACK SURGERY    . CARPAL TUNNEL RELEASE    . CHOLECYSTECTOMY N/A 06/19/2013   Procedure: LAPAROSCOPIC CHOLECYSTECTOMY;  Surgeon: Harl Bowie, MD;  Location: [facility];  Service: General;  Laterality: N/A;  . [place]    . SHOULDER SURGERY    . SPLENECTOMY, TOTAL    :   Current Outpatient Prescriptions:  .  acetaminophen (TYLENOL) 500 MG tablet, Take 500 mg by mouth every 6 (six) hours as needed for mild pain or headache. Reported on 09/07/2015, Disp: , Rfl:  .  ALPRAZolam (XANAX) 0.5 MG tablet, Take 0.25-0.5 mg by mouth at bedtime as needed for sleep. Reported on 09/07/2015, Disp: , Rfl:  .  aspirin EC 325 MG tablet, Take 325 mg by mouth daily., Disp: , Rfl:  .  BIOTIN PO, Take 1,500 mg by mouth daily., Disp: , Rfl:  .  Cholecalciferol (VITAMIN D3) 2000 units TABS, Take 2,000 Units by mouth daily., Disp: , Rfl:  .  ferrous sulfate 325 (65 FE) MG tablet, Take 325 mg by mouth daily with breakfast., Disp: , Rfl:  .  fish oil-omega-3 fatty acids 1000 MG capsule, Take 2 g by mouth daily after lunch., Disp: , Rfl:  .  hydrochlorothiazide (HYDRODIURIL) 25 MG tablet, Take 25 mg by  mouth daily., Disp: , Rfl:  .  Multiple Vitamin (MULTIVITAMIN WITH MINERALS) TABS, Take 1 tablet by mouth daily after lunch., Disp: , Rfl:  .  Polyethyl Glycol-Propyl Glycol (SYSTANE OP), Place 1 drop into both eyes 2 (two) times daily. , Disp: , Rfl:  .  ranitidine (ZANTAC) 150 MG tablet, Take 150 mg by mouth daily as needed for heartburn. Reported on 09/07/2015, Disp: , Rfl: :  Allergies  Allergen Reactions  . Felodipine     Reaction: unknown possible nausea or rash  . Omeprazole     Reaction:  unknown possible nausea or rash  . Shrimp [Shellfish Allergy] Nausea And Vomiting  :  Family History  Problem Relation Age of Onset  . Heart disease Mother     "enlarged valve"  . Cancer Mother     Ovarian  . Stroke Father   . Cancer Sister     Colon  :  Social History   Social History  . Marital status: Widowed    Spouse name: N/A  . Number of children: N/A  . Years of education: N/A   Occupational History  . Not on file.   Social History Main Topics  . Smoking status: Never Smoker  . Smokeless tobacco: Never Used  . Alcohol use No  . Drug use: No  . Sexual activity: Not on file   Other Topics Concern  . Not on file   Social History Narrative  . No narrative on file  :  Pertinent items are noted in HPI.  Exam: Blood pressure (!) 148/66, pulse 62, temperature 98.3 F (36.8 C), temperature source Oral, resp. rate 18, height 5' 3" (1.6 m), weight 167 lb 6.4 oz (75.9 kg), SpO2 99 %.  ECOG 1 General appearance: alert and cooperative reported without distress. Head: Normocephalic, without obvious abnormality Throat: lips, mucosa, and tongue normal; teeth and gums normal no oral thrush noted. Neck: no adenopathy Back: negative Resp: clear to auscultation bilaterally Chest wall: no tenderness Cardio: regular rate and rhythm, S1, S2 normal, no murmur, click, rub or gallop GI: soft, non-tender; bowel sounds normal; no masses,  no organomegaly Extremities: extremities normal, atraumatic, no cyanosis or edema   CBC    Component Value Date/Time   WBC 6.5 03/29/2016 1135   RBC 3.88 03/29/2016 1135   HGB 12.2 03/29/2016 1135   HCT 36.8 03/29/2016 1135   PLT 302 03/29/2016 1135   MCV 94.8 03/29/2016 1135   MCH 31.4 03/29/2016 1135   MCHC 33.2 03/29/2016 1135   RDW 15.4 03/29/2016 1135   LYMPHSABS 1.5 08/02/2013 1306   MONOABS 0.5 08/02/2013 1306   EOSABS 0.1 08/02/2013 1306   BASOSABS 0.1 08/02/2013 1306     Assessment and Plan:   82 year old woman with  the following issues:  1. 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 The differential diagnosis was discussed with the patient today and likely this represents early signs of myeloproliferative disorder. Her spleen have been resected in the past and likely her expansion of hematopoiesis forced these findings.  Condition such as myelofibrosis, chronic myelogenous leukemia, essential thrombocythemia among others could be contributing. Infiltrative malignancy into the bone marrow is extremely unlikely without any evidence of this on her CT scan.  From a management standpoint, her CBCs perfectly normal and even if she has early signs of mild early myeloproliferative disorder, no treatment is warranted. Surgical resection of this mass would be appropriate if it  becomes massively enlarged or symptomatic.  We also discussed the rationale for doing a bone marrow biopsy at some point. Risks and benefits of this procedure were reviewed and we have deferred this option to a later date given the fact that she is asymptomatic and likely will not alter our management.  2. Abdominal pain: I do not think this is related to her peritoneal mass but certainly worth monitoring moving forward.  3. Follow-up: Will be in 4 Mr. repeat a CBC and check on her clinical status.

## 2016-06-03 DIAGNOSIS — N183 Chronic kidney disease, stage 3 (moderate): Secondary | ICD-10-CM | POA: Diagnosis not present

## 2016-06-03 DIAGNOSIS — I129 Hypertensive chronic kidney disease with stage 1 through stage 4 chronic kidney disease, or unspecified chronic kidney disease: Secondary | ICD-10-CM | POA: Diagnosis not present

## 2016-06-03 DIAGNOSIS — E669 Obesity, unspecified: Secondary | ICD-10-CM | POA: Diagnosis not present

## 2016-06-23 DIAGNOSIS — Z1231 Encounter for screening mammogram for malignant neoplasm of breast: Secondary | ICD-10-CM | POA: Diagnosis not present

## 2016-06-30 DIAGNOSIS — H353120 Nonexudative age-related macular degeneration, left eye, stage unspecified: Secondary | ICD-10-CM | POA: Diagnosis not present

## 2016-06-30 DIAGNOSIS — H40013 Open angle with borderline findings, low risk, bilateral: Secondary | ICD-10-CM | POA: Diagnosis not present

## 2016-06-30 DIAGNOSIS — H353110 Nonexudative age-related macular degeneration, right eye, stage unspecified: Secondary | ICD-10-CM | POA: Diagnosis not present

## 2016-06-30 DIAGNOSIS — Z961 Presence of intraocular lens: Secondary | ICD-10-CM | POA: Diagnosis not present

## 2016-07-07 ENCOUNTER — Ambulatory Visit: Payer: Medicare Other

## 2016-07-07 DIAGNOSIS — N3000 Acute cystitis without hematuria: Secondary | ICD-10-CM | POA: Diagnosis not present

## 2016-07-07 DIAGNOSIS — R109 Unspecified abdominal pain: Secondary | ICD-10-CM | POA: Diagnosis not present

## 2016-07-07 DIAGNOSIS — R350 Frequency of micturition: Secondary | ICD-10-CM | POA: Diagnosis not present

## 2016-08-13 DIAGNOSIS — L218 Other seborrheic dermatitis: Secondary | ICD-10-CM | POA: Diagnosis not present

## 2016-08-13 DIAGNOSIS — L570 Actinic keratosis: Secondary | ICD-10-CM | POA: Diagnosis not present

## 2016-08-13 DIAGNOSIS — L821 Other seborrheic keratosis: Secondary | ICD-10-CM | POA: Diagnosis not present

## 2016-08-13 DIAGNOSIS — L814 Other melanin hyperpigmentation: Secondary | ICD-10-CM | POA: Diagnosis not present

## 2016-08-13 DIAGNOSIS — D485 Neoplasm of uncertain behavior of skin: Secondary | ICD-10-CM | POA: Diagnosis not present

## 2016-08-19 ENCOUNTER — Encounter: Payer: Self-pay | Admitting: Interventional Radiology

## 2016-08-31 DIAGNOSIS — L300 Nummular dermatitis: Secondary | ICD-10-CM | POA: Diagnosis not present

## 2016-08-31 DIAGNOSIS — L218 Other seborrheic dermatitis: Secondary | ICD-10-CM | POA: Diagnosis not present

## 2016-08-31 DIAGNOSIS — L570 Actinic keratosis: Secondary | ICD-10-CM | POA: Diagnosis not present

## 2016-09-07 ENCOUNTER — Telehealth: Payer: Self-pay | Admitting: Oncology

## 2016-09-07 ENCOUNTER — Other Ambulatory Visit: Payer: Medicare Other

## 2016-09-07 ENCOUNTER — Ambulatory Visit (HOSPITAL_BASED_OUTPATIENT_CLINIC_OR_DEPARTMENT_OTHER): Payer: Medicare Other | Admitting: Oncology

## 2016-09-07 VITALS — BP 176/75 | HR 53 | Temp 98.1°F | Resp 18 | Ht 63.0 in | Wt 166.5 lb

## 2016-09-07 DIAGNOSIS — D759 Disease of blood and blood-forming organs, unspecified: Secondary | ICD-10-CM

## 2016-09-07 DIAGNOSIS — D7589 Other specified diseases of blood and blood-forming organs: Secondary | ICD-10-CM

## 2016-09-07 LAB — COMPREHENSIVE METABOLIC PANEL
ALT: 15 U/L (ref 0–55)
AST: 25 U/L (ref 5–34)
Albumin: 3.7 g/dL (ref 3.5–5.0)
Alkaline Phosphatase: 82 U/L (ref 40–150)
Anion Gap: 11 mEq/L (ref 3–11)
BUN: 27.2 mg/dL — ABNORMAL HIGH (ref 7.0–26.0)
CO2: 24 mEq/L (ref 22–29)
Calcium: 10.4 mg/dL (ref 8.4–10.4)
Chloride: 104 mEq/L (ref 98–109)
Creatinine: 1 mg/dL (ref 0.6–1.1)
EGFR: 52 mL/min/{1.73_m2} — ABNORMAL LOW (ref >90)
Glucose: 98 mg/dl (ref 70–140)
Potassium: 3.9 mEq/L (ref 3.5–5.1)
Sodium: 139 mEq/L (ref 136–145)
Total Bilirubin: 0.35 mg/dL (ref 0.20–1.20)
Total Protein: 6.7 g/dL (ref 6.4–8.3)

## 2016-09-07 LAB — CBC WITH DIFFERENTIAL/PLATELET
BASO%: 1.2 % (ref 0.0–2.0)
Basophils Absolute: 0.1 10*3/uL (ref 0.0–0.1)
EOS%: 14.9 % — ABNORMAL HIGH (ref 0.0–7.0)
Eosinophils Absolute: 1 10*3/uL — ABNORMAL HIGH (ref 0.0–0.5)
HCT: 42.4 % (ref 34.8–46.6)
HGB: 13.9 g/dL (ref 11.6–15.9)
LYMPH%: 21.7 % (ref 14.0–49.7)
MCH: 31 pg (ref 25.1–34.0)
MCHC: 32.7 g/dL (ref 31.5–36.0)
MCV: 94.7 fL (ref 79.5–101.0)
MONO#: 0.9 10*3/uL (ref 0.1–0.9)
MONO%: 12.8 % (ref 0.0–14.0)
NEUT#: 3.3 10*3/uL (ref 1.5–6.5)
NEUT%: 49.4 % (ref 38.4–76.8)
Platelets: 232 10*3/uL (ref 145–400)
RBC: 4.48 10*6/uL (ref 3.70–5.45)
RDW: 15 % — ABNORMAL HIGH (ref 11.2–14.5)
WBC: 6.8 10*3/uL (ref 3.9–10.3)
lymph#: 1.5 10*3/uL (ref 0.9–3.3)

## 2016-09-07 LAB — LACTATE DEHYDROGENASE: LDH: 197 U/L (ref 125–245)

## 2016-09-07 NOTE — Progress Notes (Signed)
Hematology and Oncology Follow Up Visit  [name] [id number] 01/14/1929 83 y.o. 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 This was discovered incidentally on a CT scan in July 2017. She has no other signs or symptoms of a blood disorder.  Current therapy: Observation and surveillance.  Interim History: [name] presents today for a follow-up visit. She is a pleasant woman I saw in consultation in September 2017. She had an incidental finding of an extramedullary hematopoiesis mass noted on a CT scan in July 2017. She is also status post splenectomy in the past. Since the last visit, she reports no recent change in her health. She denied any fatigue, tiredness or constitutional symptoms. She denied any fevers or chills. She denied any abdominal pain or discomfort. She currently lives in a senior living facility and ambulates with the help of a walker. She denied any falls or syncope.  She does not report any headaches, blurry vision, syncope or seizures. She does not report any fevers, chills or sweats. She does not report any cough, wheezing or hemoptysis. She does not report any chest pain, palpitation, orthopnea or leg edema. She does not report any nausea, vomiting hematochezia or melena. She does not report any frequency urgency or hesitancy. She does not report any skeletal complaints. Remaining review of systems unremarkable.   Medications: I have reviewed the patient's current medications.  Current Outpatient Prescriptions  Medication Sig Dispense Refill  . acetaminophen (TYLENOL) 500 MG tablet Take 500 mg by mouth every 6 (six) hours as needed for mild pain or headache. Reported on 09/07/2015    . ALPRAZolam (XANAX) 0.5 MG tablet Take 0.25-0.5 mg by mouth at bedtime as needed for sleep. Reported on 09/07/2015    . aspirin EC 325 MG tablet Take 325 mg by mouth daily.    Marland Kitchen BIOTIN PO Take 1,500 mg  by mouth daily.    . Cholecalciferol (VITAMIN D3) 2000 units TABS Take 2,000 Units by mouth daily.    . ferrous sulfate 325 (65 FE) MG tablet Take 325 mg by mouth daily with breakfast.    . fish oil-omega-3 fatty acids 1000 MG capsule Take 2 g by mouth daily after lunch.    . hydrochlorothiazide (HYDRODIURIL) 25 MG tablet Take 25 mg by mouth daily.    . Multiple Vitamin (MULTIVITAMIN WITH MINERALS) TABS Take 1 tablet by mouth daily after lunch.    Vladimir Faster Glycol-Propyl Glycol (SYSTANE OP) Place 1 drop into both eyes 2 (two) times daily.     . ranitidine (ZANTAC) 150 MG tablet Take 150 mg by mouth daily as needed for heartburn. Reported on 09/07/2015     No current facility-administered medications for this visit.      Allergies:  Allergies  Allergen Reactions  . Shrimp [Shellfish Allergy] Nausea And Vomiting  . Felodipine Other (See Comments)    Reaction: unknown possible nausea or rash  . Omeprazole Other (See Comments)    Reaction: unknown possible nausea or rash    Past Medical History, Surgical history, Social history, and Family History were reviewed and updated.   Physical Exam: Blood pressure (!) 176/75, pulse (!) 53, temperature 98.1 F (36.7 C), temperature source Oral, resp. rate 18, height _0  (1.6 m), weight 166 lb 8 oz (75.5 kg), SpO2 97 %. ECOG: 0 General appearance: alert and cooperative appeared without distress. Head: Normocephalic, without obvious abnormality no oral ulcers or lesions. Neck: no adenopathy and no  carotid bruit Lymph nodes: Cervical, supraclavicular, and axillary nodes normal. Heart:regular rate and rhythm, S1, S2 normal, no murmur, click, rub or gallop Lung:chest clear, no wheezing, rales, normal symmetric air entry. Abdomin: soft, non-tender, without masses or organomegaly EXT:no erythema, induration, or nodules   Lab Results: Lab Results  Component Value Date   WBC 6.5 03/29/2016   HGB 12.2 03/29/2016   HCT 36.8 03/29/2016   MCV  94.8 03/29/2016   PLT 302 03/29/2016     Chemistry      Component Value Date/Time   NA 136 08/03/2013 0550   K 4.3 08/03/2013 0550   CL 105 08/03/2013 0550   CO2 22 08/03/2013 0550   BUN 40 (H) 08/03/2013 0550   CREATININE 1.32 (H) 08/03/2013 0550      Component Value Date/Time   CALCIUM 8.9 08/03/2013 0550   ALKPHOS 53 08/03/2013 0550   AST 33 08/03/2013 0550   ALT 22 08/03/2013 0550   BILITOT 0.2 (L) 08/03/2013 0550       Impression and Plan:  83 year old woman with the following issues:  1. 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  This represents early signs of myeloproliferative disorder. Her spleen have been resected in the past and likely her expansion of hematopoiesis forced these findings. Please conditions include myelofibrosis, chronic myelogenous leukemia among others. Infiltrating bone marrow disease or other causes are also consideration.  I have recommended a bone marrow biopsy for full quantification and she declined at this time. She is completely asymptomatic and her CBC is normal. I feel it is reasonable to continue to monitor this elderly asymptomatic woman. She develops any symptoms or worsening cytopenias, workup will take place.   2. Abdominal pain: I do not believe this is related to her hematological findings. Likely related to a hiatal hernia.  3. Follow-up: Will be in 6 months.    [email or web address],MMNOT, MD 1/23/201810:38 AM

## 2016-09-07 NOTE — Telephone Encounter (Signed)
Appointments scheduled per 1/23 LOS. Patient given AVS report and calendars with future scheduled appointments. °

## 2016-09-30 ENCOUNTER — Ambulatory Visit (INDEPENDENT_AMBULATORY_CARE_PROVIDER_SITE_OTHER): Payer: Medicare Other

## 2016-09-30 ENCOUNTER — Encounter (INDEPENDENT_AMBULATORY_CARE_PROVIDER_SITE_OTHER): Payer: Self-pay | Admitting: Orthopaedic Surgery

## 2016-09-30 ENCOUNTER — Ambulatory Visit (INDEPENDENT_AMBULATORY_CARE_PROVIDER_SITE_OTHER): Payer: Medicare Other | Admitting: Orthopaedic Surgery

## 2016-09-30 VITALS — BP 167/79 | HR 79 | Ht 63.0 in | Wt 165.0 lb

## 2016-09-30 DIAGNOSIS — M25562 Pain in left knee: Secondary | ICD-10-CM

## 2016-09-30 DIAGNOSIS — G8929 Other chronic pain: Secondary | ICD-10-CM

## 2016-09-30 MED ORDER — LIDOCAINE HCL 1 % IJ SOLN
5.0000 mL | INTRAMUSCULAR | Status: AC | PRN
  Administered 2016-09-30: 5 mL

## 2016-09-30 MED ORDER — BUPIVACAINE HCL 0.5 % IJ SOLN
3.0000 mL | INTRAMUSCULAR | Status: AC | PRN
  Administered 2016-09-30: 3 mL via INTRA_ARTICULAR

## 2016-09-30 MED ORDER — METHYLPREDNISOLONE ACETATE 40 MG/ML IJ SUSP
80.0000 mg | INTRAMUSCULAR | Status: AC | PRN
  Administered 2016-09-30: 80 mg

## 2016-09-30 NOTE — Progress Notes (Signed)
Office Visit Note   Patient: [name]           Date of Birth: Dec 17, 1928           MRN: [id number] Visit Date: 09/30/2016              Requested by: Lajean Manes, MD [address] [place], [place] [address] PCP: Mathews Argyle, MD   Assessment & Plan: Visit Diagnoses: Tricompartmental osteoarthritis left knee   Plan cortisone injection and follow up in 1 month if no improvement: Long discussion regarding diagnosis and x-ray findings  Follow-Up Instructions: No Follow-up on file.   Orders:  No orders of the defined types were placed in this encounter.  No orders of the defined types were placed in this encounter.     Procedures: Large Joint Inj Date/Time: 09/30/2016 2:14 PM Performed by: Garald Balding Authorized by: Garald Balding   Consent Given by:  Patient Timeout: prior to procedure the correct patient, procedure, and site was verified   Indications:  Pain and joint swelling Location:  Knee Site:  L knee Prep: patient was prepped and draped in usual sterile fashion   Needle Size:  25 G Needle Length:  1.5 inches Approach:  Anteromedial Ultrasound Guidance: No   Fluoroscopic Guidance: No   Arthrogram: No   Medications:  5 mL lidocaine 1 %; 80 mg methylPREDNISolone acetate 40 MG/ML; 3 mL bupivacaine 0.5 % Aspiration Attempted: No   Patient tolerance:  Patient tolerated the procedure well with no immediate complications     Clinical Data: No additional findings.   Subjective: No chief complaint on file.   Pt presents today for Left knee pain x 2 weeks. She can sit but standing and walking hurts the back of her knee.Pt ambulates with a walker.   [name] denies history of injury or trauma. She denies fever or chills. Pain came on rather suddenly approximately 2 weeks ago. She does use a walker for her "balance". Review of Systems   Objective: Vital Signs: There were no vitals taken for this visit.  Physical  Exam  Ortho Exam left knee exam with very minimal effusion. Full extension. Flexion over 115. No instability. Mild diffuse lateral joint pain. No swelling distally. Neurovascular exam intact. Straight leg raise negative. Painless range of motion both hips.  Specialty Comments:  No specialty comments available.  Imaging: No results found.   PMFS History: Patient Active Problem List   Diagnosis Date Noted  . Hyperlipidemia 05/20/2014  . Hypotension 08/02/2013  . UTI (lower urinary tract infection) 08/02/2013  . Acute renal failure ([place]) 08/02/2013  . ARF (acute renal failure) ([facility]) 08/02/2013  . Calculus of gallbladder with acute cholecystitis, without mention of obstruction 07/10/2013  . Acute cholecystitis 06/17/2013  . Chest pain 06/17/2013  . Pulmonary infiltrate in left lung on chest x-ray 06/17/2013  . Hypertension   . CAD (coronary artery disease)    Past Medical History:  Diagnosis Date  . Arthritis   . CAD (coronary artery disease)    a. Mod prox LAD & first diagonal stenosis by cath 2002 - managed medically since that time with normal LV function.  . Cataract   . Hyperlipidemia   . Hypertension     Family History  Problem Relation Age of Onset  . Heart disease Mother     "enlarged valve"  . Cancer Mother     Ovarian  . Stroke Father   . Cancer Sister  Colon    Past Surgical History:  Procedure Laterality Date  . ABDOMINAL HYSTERECTOMY    . APPENDECTOMY    . BACK SURGERY    . CARPAL TUNNEL RELEASE    . CHOLECYSTECTOMY N/A 06/19/2013   Procedure: LAPAROSCOPIC CHOLECYSTECTOMY;  Surgeon: Harl Bowie, MD;  Location: [facility];  Service: General;  Laterality: N/A;  . HERNIA REPAIR    . IR GENERIC HISTORICAL  03/18/2016   IR RADIOLOGIST EVAL & MGMT 03/18/2016 Corrie Mckusick, DO [facility] INTERV RAD  . KNEE SURGERY    . SHOULDER SURGERY    . SPLENECTOMY, TOTAL     Social History   Occupational History  . Not on file.   Social History Main Topics  .  Smoking status: Never Smoker  . Smokeless tobacco: Never Used  . Alcohol use No  . Drug use: No  . Sexual activity: Not on file

## 2016-10-05 DIAGNOSIS — L570 Actinic keratosis: Secondary | ICD-10-CM | POA: Diagnosis not present

## 2016-10-25 DIAGNOSIS — H02421 Myogenic ptosis of right eyelid: Secondary | ICD-10-CM | POA: Diagnosis not present

## 2016-10-25 DIAGNOSIS — H02423 Myogenic ptosis of bilateral eyelids: Secondary | ICD-10-CM | POA: Diagnosis not present

## 2016-12-06 DIAGNOSIS — H02421 Myogenic ptosis of right eyelid: Secondary | ICD-10-CM | POA: Diagnosis not present

## 2016-12-16 DIAGNOSIS — R1084 Generalized abdominal pain: Secondary | ICD-10-CM | POA: Diagnosis not present

## 2016-12-16 DIAGNOSIS — N183 Chronic kidney disease, stage 3 (moderate): Secondary | ICD-10-CM | POA: Diagnosis not present

## 2016-12-16 DIAGNOSIS — M25571 Pain in right ankle and joints of right foot: Secondary | ICD-10-CM | POA: Diagnosis not present

## 2016-12-16 DIAGNOSIS — E7800 Pure hypercholesterolemia, unspecified: Secondary | ICD-10-CM | POA: Diagnosis not present

## 2016-12-16 DIAGNOSIS — M25572 Pain in left ankle and joints of left foot: Secondary | ICD-10-CM | POA: Diagnosis not present

## 2016-12-16 DIAGNOSIS — Z79899 Other long term (current) drug therapy: Secondary | ICD-10-CM | POA: Diagnosis not present

## 2016-12-16 DIAGNOSIS — I129 Hypertensive chronic kidney disease with stage 1 through stage 4 chronic kidney disease, or unspecified chronic kidney disease: Secondary | ICD-10-CM | POA: Diagnosis not present

## 2016-12-16 DIAGNOSIS — G8929 Other chronic pain: Secondary | ICD-10-CM | POA: Diagnosis not present

## 2016-12-16 DIAGNOSIS — Z6830 Body mass index (BMI) 30.0-30.9, adult: Secondary | ICD-10-CM | POA: Diagnosis not present

## 2016-12-16 DIAGNOSIS — R110 Nausea: Secondary | ICD-10-CM | POA: Diagnosis not present

## 2016-12-16 DIAGNOSIS — Z23 Encounter for immunization: Secondary | ICD-10-CM | POA: Diagnosis not present

## 2016-12-16 DIAGNOSIS — Z0000 Encounter for general adult medical examination without abnormal findings: Secondary | ICD-10-CM | POA: Diagnosis not present

## 2016-12-16 DIAGNOSIS — E669 Obesity, unspecified: Secondary | ICD-10-CM | POA: Diagnosis not present

## 2016-12-16 DIAGNOSIS — K5901 Slow transit constipation: Secondary | ICD-10-CM | POA: Diagnosis not present

## 2016-12-16 DIAGNOSIS — Z1389 Encounter for screening for other disorder: Secondary | ICD-10-CM | POA: Diagnosis not present

## 2017-02-01 DIAGNOSIS — L219 Seborrheic dermatitis, unspecified: Secondary | ICD-10-CM | POA: Diagnosis not present

## 2017-02-01 DIAGNOSIS — L570 Actinic keratosis: Secondary | ICD-10-CM | POA: Diagnosis not present

## 2017-02-01 DIAGNOSIS — L814 Other melanin hyperpigmentation: Secondary | ICD-10-CM | POA: Diagnosis not present

## 2017-02-03 ENCOUNTER — Other Ambulatory Visit: Payer: Self-pay | Admitting: Geriatric Medicine

## 2017-02-03 DIAGNOSIS — I129 Hypertensive chronic kidney disease with stage 1 through stage 4 chronic kidney disease, or unspecified chronic kidney disease: Secondary | ICD-10-CM | POA: Diagnosis not present

## 2017-02-03 DIAGNOSIS — R109 Unspecified abdominal pain: Secondary | ICD-10-CM | POA: Diagnosis not present

## 2017-02-03 DIAGNOSIS — R1084 Generalized abdominal pain: Secondary | ICD-10-CM

## 2017-02-03 DIAGNOSIS — K5900 Constipation, unspecified: Secondary | ICD-10-CM | POA: Diagnosis not present

## 2017-02-07 DIAGNOSIS — H02421 Myogenic ptosis of right eyelid: Secondary | ICD-10-CM | POA: Diagnosis not present

## 2017-02-11 ENCOUNTER — Ambulatory Visit
Admission: RE | Admit: 2017-02-11 | Discharge: 2017-02-11 | Disposition: A | Discharge status: Home / Self Care | Payer: Medicare Other | Source: Ambulatory Visit | Attending: Geriatric Medicine | Admitting: Geriatric Medicine

## 2017-02-11 DIAGNOSIS — R1084 Generalized abdominal pain: Secondary | ICD-10-CM

## 2017-02-17 DIAGNOSIS — N183 Chronic kidney disease, stage 3 (moderate): Secondary | ICD-10-CM | POA: Diagnosis not present

## 2017-02-17 DIAGNOSIS — R110 Nausea: Secondary | ICD-10-CM | POA: Diagnosis not present

## 2017-02-17 DIAGNOSIS — I129 Hypertensive chronic kidney disease with stage 1 through stage 4 chronic kidney disease, or unspecified chronic kidney disease: Secondary | ICD-10-CM | POA: Diagnosis not present

## 2017-02-17 DIAGNOSIS — H3530 Unspecified macular degeneration: Secondary | ICD-10-CM | POA: Diagnosis not present

## 2017-03-08 ENCOUNTER — Other Ambulatory Visit (HOSPITAL_BASED_OUTPATIENT_CLINIC_OR_DEPARTMENT_OTHER): Payer: Medicare Other

## 2017-03-08 ENCOUNTER — Ambulatory Visit (HOSPITAL_BASED_OUTPATIENT_CLINIC_OR_DEPARTMENT_OTHER): Payer: Medicare Other | Admitting: Oncology

## 2017-03-08 ENCOUNTER — Telehealth: Payer: Self-pay | Admitting: Oncology

## 2017-03-08 VITALS — BP 195/62 | HR 68 | Temp 98.2°F | Resp 17 | Ht 63.0 in | Wt 168.9 lb

## 2017-03-08 DIAGNOSIS — D471 Chronic myeloproliferative disease: Secondary | ICD-10-CM

## 2017-03-08 DIAGNOSIS — D759 Disease of blood and blood-forming organs, unspecified: Secondary | ICD-10-CM

## 2017-03-08 LAB — CBC WITH DIFFERENTIAL/PLATELET
BASO%: 0.6 % (ref 0.0–2.0)
Basophils Absolute: 0 10*3/uL (ref 0.0–0.1)
EOS%: 2.8 % (ref 0.0–7.0)
Eosinophils Absolute: 0.2 10*3/uL (ref 0.0–0.5)
HCT: 32.3 % — ABNORMAL LOW (ref 34.8–46.6)
HGB: 10.6 g/dL — ABNORMAL LOW (ref 11.6–15.9)
LYMPH%: 19.5 % (ref 14.0–49.7)
MCH: 28.5 pg (ref 25.1–34.0)
MCHC: 32.7 g/dL (ref 31.5–36.0)
MCV: 87.3 fL (ref 79.5–101.0)
MONO#: 1.1 10*3/uL — ABNORMAL HIGH (ref 0.1–0.9)
MONO%: 14.7 % — ABNORMAL HIGH (ref 0.0–14.0)
NEUT#: 4.5 10*3/uL (ref 1.5–6.5)
NEUT%: 62.4 % (ref 38.4–76.8)
Platelets: 362 10*3/uL (ref 145–400)
RBC: 3.7 10*6/uL (ref 3.70–5.45)
RDW: 15.6 % — ABNORMAL HIGH (ref 11.2–14.5)
WBC: 7.2 10*3/uL (ref 3.9–10.3)
lymph#: 1.4 10*3/uL (ref 0.9–3.3)

## 2017-03-08 NOTE — Telephone Encounter (Signed)
Scheduled appt per 7/24 los - Gave patient AVS and calender per los.  

## 2017-03-08 NOTE — Progress Notes (Signed)
Hematology and Oncology Follow Up Visit  [name] [id number] 02/23/1929 83 y.o. 03/08/2017 10:41 AM Stoneking, Hal, MDStoneking, Hal, MD   Principle Diagnosis: 83-year-old with extramedullary hematopoiesis. This was discovered incidentally on a CT scan in July 2017. The etiology is related to occult bone marrow disease such as myelofibrosis or other myeloproliferative disorders.  Current therapy: Observation and surveillance.  Interim History: [name] presents today for a follow-up visit. Since the last visit, she reports no recent change in her health. She denied any fatigue, tiredness or constitutional symptoms. She denied any fevers or chills. She denied any abdominal pain or discomfort. She does report periodic nausea but no vomiting. She continues to do well and maintaining her weight. She currently lives in a senior living facility and ambulates with the help of a walker. She denied any falls or syncope. She denied any recent hospitalization or illnesses. She denied any need for transfusions.  She does not report any headaches, blurry vision, syncope or seizures. She does not report any fevers, chills or sweats. She does not report any cough, wheezing or hemoptysis. She does not report any chest pain, palpitation, orthopnea or leg edema. She does not report any nausea, vomiting hematochezia or melena. She does not report any frequency urgency or hesitancy. She does not report any skeletal complaints. Remaining review of systems unremarkable.   Medications: I have reviewed the patient's current medications.  Current Outpatient Prescriptions  Medication Sig Dispense Refill  . acetaminophen (TYLENOL) 500 MG tablet Take 500 mg by mouth every 6 (six) hours as needed for mild pain or headache. Reported on 09/07/2015    . ALPRAZolam (XANAX) 0.5 MG tablet Take 0.25-0.5 mg by mouth at bedtime as needed for sleep. Reported on 09/07/2015    . aspirin EC 325 MG tablet Take 325 mg by mouth daily.     . BIOTIN PO Take 1,500 mg by mouth daily.    . Cholecalciferol (VITAMIN D3) 2000 units TABS Take 2,000 Units by mouth daily.    . ferrous sulfate 325 (65 FE) MG tablet Take 325 mg by mouth daily with breakfast.    . fish oil-omega-3 fatty acids 1000 MG capsule Take 2 g by mouth daily after lunch.    . hydrochlorothiazide (HYDRODIURIL) 25 MG tablet Take 25 mg by mouth daily.    . Multiple Vitamin (MULTIVITAMIN WITH MINERALS) TABS Take 1 tablet by mouth daily after lunch.    . Polyethyl Glycol-Propyl Glycol (SYSTANE OP) Place 1 drop into both eyes 2 (two) times daily.     . ranitidine (ZANTAC) 150 MG tablet Take 150 mg by mouth daily as needed for heartburn. Reported on 09/07/2015     No current facility-administered medications for this visit.      Allergies:  Allergies  Allergen Reactions  . Shrimp [Shellfish Allergy] Nausea And Vomiting  . Felodipine Other (See Comments)    Reaction: unknown possible nausea or rash  . Omeprazole Other (See Comments)    Reaction: unknown possible nausea or rash    Past Medical History, Surgical history, Social history, and Family History were reviewed and updated.   Physical Exam: Blood pressure (!) 195/62, pulse 68, temperature 98.2 F (36.8 C), temperature source Oral, resp. rate 17, height 5' 3" (1.6 m), weight 168 lb 14.4 oz (76.6 kg), SpO2 100 %. ECOG: 0 General appearance: Well-appearing woman appeared without distress. Head: Normocephalic, without obvious abnormality no oral thrush or ulcers. Neck: no adenopathy and no carotid bruit Lymph nodes: Cervical, supraclavicular,   and axillary nodes normal. Heart:regular rate and rhythm, S1, S2 normal, no murmur, click, rub or gallop Lung:chest clear, no wheezing, rales, normal symmetric air entry. Abdomin: soft, non-tender, without masses or organomegaly shifting dullness or ascites. EXT:no erythema, induration, or nodules   Lab Results: Lab Results  Component Value Date   WBC 7.2  03/08/2017   HGB 10.6 (L) 03/08/2017   HCT 32.3 (L) 03/08/2017   MCV 87.3 03/08/2017   PLT 362 03/08/2017     Chemistry      Component Value Date/Time   NA 139 09/07/2016 0958   K 3.9 09/07/2016 0958   CL 105 08/03/2013 0550   CO2 24 09/07/2016 0958   BUN 27.2 (H) 09/07/2016 0958   CREATININE 1.0 09/07/2016 0958      Component Value Date/Time   CALCIUM 10.4 09/07/2016 0958   ALKPHOS 82 09/07/2016 0958   AST 25 09/07/2016 0958   ALT 15 09/07/2016 0958   BILITOT 0.35 09/07/2016 0958       Impression and Plan:  83 year old woman with the following issues:  1. 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  This represents early signs of myeloproliferative disorder. Her spleen have been resected in the past and likely her expansion of hematopoiesis forced these findings.   The differential diagnosis includes myelofibrosis as the likely condition given her normal white cell count and mild anemia. Her CBC was reviewed today and did show mild anemia but normal platelet count and white cell count. She has very mild monocytosis.  From a  management standpoint, she continues to be asymptomatic from these findings. Her anemia is rather mild and does not require any intervention. To fully diagnosis condition she will require bone marrow biopsy which I discussed with her today. Risks and benefits of this procedure was reviewed again as well as the indications for it. At this time, it will not alter our management given we confirm the diagnosis of myelofibrosis. She is now back candidate for aggressive therapy and the plan for day for supportive care only.  I recommended observation and surveillance and repeat counts in 6 months and consider bone marrow biopsy in the future and transfusions as needed. I will check iron studies as well with that visit.  2. Mild nausea without weight loss: Could be related to hiatal hernia and unlikely  related to her hematological condition.   3. Follow-up: Will be in 6 months.    [facility], MD 7/24/201810:41 AM

## 2017-03-23 ENCOUNTER — Other Ambulatory Visit: Payer: Self-pay | Admitting: Gastroenterology

## 2017-03-23 DIAGNOSIS — K5900 Constipation, unspecified: Secondary | ICD-10-CM | POA: Diagnosis not present

## 2017-03-23 DIAGNOSIS — R1013 Epigastric pain: Secondary | ICD-10-CM | POA: Diagnosis not present

## 2017-03-23 DIAGNOSIS — R110 Nausea: Secondary | ICD-10-CM | POA: Diagnosis not present

## 2017-03-23 DIAGNOSIS — K222 Esophageal obstruction: Secondary | ICD-10-CM | POA: Diagnosis not present

## 2017-03-24 ENCOUNTER — Other Ambulatory Visit: Payer: Self-pay | Admitting: Gastroenterology

## 2017-04-12 ENCOUNTER — Encounter (HOSPITAL_COMMUNITY): Payer: Self-pay

## 2017-04-26 ENCOUNTER — Encounter (HOSPITAL_COMMUNITY): Payer: Self-pay | Admitting: *Deleted

## 2017-04-26 ENCOUNTER — Encounter (HOSPITAL_COMMUNITY)
Admission: RE | Disposition: A | Discharge status: Home / Self Care | Payer: Self-pay | Source: Ambulatory Visit | Attending: Gastroenterology

## 2017-04-26 ENCOUNTER — Ambulatory Visit (HOSPITAL_COMMUNITY): Payer: Medicare Other | Admitting: Anesthesiology

## 2017-04-26 ENCOUNTER — Ambulatory Visit (HOSPITAL_COMMUNITY)
Admission: RE | Admit: 2017-04-26 | Discharge: 2017-04-26 | Disposition: A | Discharge status: Home / Self Care | Payer: Medicare Other | Source: Ambulatory Visit | Attending: Gastroenterology | Admitting: Gastroenterology

## 2017-04-26 DIAGNOSIS — K2960 Other gastritis without bleeding: Secondary | ICD-10-CM | POA: Insufficient documentation

## 2017-04-26 DIAGNOSIS — Z79899 Other long term (current) drug therapy: Secondary | ICD-10-CM | POA: Insufficient documentation

## 2017-04-26 DIAGNOSIS — K2980 Duodenitis without bleeding: Secondary | ICD-10-CM | POA: Diagnosis not present

## 2017-04-26 DIAGNOSIS — Z885 Allergy status to narcotic agent status: Secondary | ICD-10-CM | POA: Insufficient documentation

## 2017-04-26 DIAGNOSIS — K449 Diaphragmatic hernia without obstruction or gangrene: Secondary | ICD-10-CM | POA: Insufficient documentation

## 2017-04-26 DIAGNOSIS — K222 Esophageal obstruction: Secondary | ICD-10-CM | POA: Insufficient documentation

## 2017-04-26 DIAGNOSIS — Z888 Allergy status to other drugs, medicaments and biological substances status: Secondary | ICD-10-CM | POA: Insufficient documentation

## 2017-04-26 DIAGNOSIS — K259 Gastric ulcer, unspecified as acute or chronic, without hemorrhage or perforation: Secondary | ICD-10-CM | POA: Diagnosis not present

## 2017-04-26 DIAGNOSIS — Z881 Allergy status to other antibiotic agents status: Secondary | ICD-10-CM | POA: Insufficient documentation

## 2017-04-26 DIAGNOSIS — E7800 Pure hypercholesterolemia, unspecified: Secondary | ICD-10-CM | POA: Diagnosis not present

## 2017-04-26 DIAGNOSIS — I2510 Atherosclerotic heart disease of native coronary artery without angina pectoris: Secondary | ICD-10-CM | POA: Insufficient documentation

## 2017-04-26 DIAGNOSIS — R0989 Other specified symptoms and signs involving the circulatory and respiratory systems: Secondary | ICD-10-CM | POA: Insufficient documentation

## 2017-04-26 DIAGNOSIS — Z90710 Acquired absence of both cervix and uterus: Secondary | ICD-10-CM | POA: Diagnosis not present

## 2017-04-26 DIAGNOSIS — Q399 Congenital malformation of esophagus, unspecified: Secondary | ICD-10-CM | POA: Insufficient documentation

## 2017-04-26 DIAGNOSIS — R1310 Dysphagia, unspecified: Secondary | ICD-10-CM | POA: Insufficient documentation

## 2017-04-26 DIAGNOSIS — D509 Iron deficiency anemia, unspecified: Secondary | ICD-10-CM | POA: Insufficient documentation

## 2017-04-26 DIAGNOSIS — R110 Nausea: Secondary | ICD-10-CM | POA: Insufficient documentation

## 2017-04-26 DIAGNOSIS — E559 Vitamin D deficiency, unspecified: Secondary | ICD-10-CM | POA: Insufficient documentation

## 2017-04-26 DIAGNOSIS — K3189 Other diseases of stomach and duodenum: Secondary | ICD-10-CM | POA: Diagnosis not present

## 2017-04-26 DIAGNOSIS — K219 Gastro-esophageal reflux disease without esophagitis: Secondary | ICD-10-CM | POA: Insufficient documentation

## 2017-04-26 DIAGNOSIS — Z9842 Cataract extraction status, left eye: Secondary | ICD-10-CM | POA: Insufficient documentation

## 2017-04-26 DIAGNOSIS — K5900 Constipation, unspecified: Secondary | ICD-10-CM | POA: Diagnosis not present

## 2017-04-26 DIAGNOSIS — H3530 Unspecified macular degeneration: Secondary | ICD-10-CM | POA: Diagnosis not present

## 2017-04-26 DIAGNOSIS — Z7982 Long term (current) use of aspirin: Secondary | ICD-10-CM | POA: Insufficient documentation

## 2017-04-26 DIAGNOSIS — Z8041 Family history of malignant neoplasm of ovary: Secondary | ICD-10-CM | POA: Insufficient documentation

## 2017-04-26 DIAGNOSIS — G5600 Carpal tunnel syndrome, unspecified upper limb: Secondary | ICD-10-CM | POA: Insufficient documentation

## 2017-04-26 DIAGNOSIS — Z823 Family history of stroke: Secondary | ICD-10-CM | POA: Insufficient documentation

## 2017-04-26 DIAGNOSIS — K5790 Diverticulosis of intestine, part unspecified, without perforation or abscess without bleeding: Secondary | ICD-10-CM | POA: Diagnosis not present

## 2017-04-26 DIAGNOSIS — Z9841 Cataract extraction status, right eye: Secondary | ICD-10-CM | POA: Diagnosis not present

## 2017-04-26 DIAGNOSIS — Z8249 Family history of ischemic heart disease and other diseases of the circulatory system: Secondary | ICD-10-CM | POA: Insufficient documentation

## 2017-04-26 DIAGNOSIS — I10 Essential (primary) hypertension: Secondary | ICD-10-CM | POA: Diagnosis not present

## 2017-04-26 DIAGNOSIS — Z9081 Acquired absence of spleen: Secondary | ICD-10-CM | POA: Insufficient documentation

## 2017-04-26 DIAGNOSIS — Z800 Family history of malignant neoplasm of digestive organs: Secondary | ICD-10-CM | POA: Insufficient documentation

## 2017-04-26 DIAGNOSIS — Z9049 Acquired absence of other specified parts of digestive tract: Secondary | ICD-10-CM | POA: Diagnosis not present

## 2017-04-26 DIAGNOSIS — K810 Acute cholecystitis: Secondary | ICD-10-CM | POA: Diagnosis not present

## 2017-04-26 HISTORY — PX: ESOPHAGOGASTRODUODENOSCOPY: SHX5428

## 2017-04-26 SURGERY — EGD (ESOPHAGOGASTRODUODENOSCOPY)
Anesthesia: Monitor Anesthesia Care

## 2017-04-26 MED ORDER — ONDANSETRON HCL 4 MG/2ML IJ SOLN
INTRAMUSCULAR | Status: DC | PRN
  Administered 2017-04-26: 4 mg via INTRAVENOUS

## 2017-04-26 MED ORDER — SODIUM CHLORIDE 0.9 % IV SOLN: INTRAVENOUS | Status: DC

## 2017-04-26 MED ORDER — DEXAMETHASONE SODIUM PHOSPHATE 10 MG/ML IJ SOLN
INTRAMUSCULAR | Status: AC
  Filled 2017-04-26: qty 1

## 2017-04-26 MED ORDER — PROPOFOL 10 MG/ML IV BOLUS
INTRAVENOUS | Status: DC | PRN
  Administered 2017-04-26 (×3): 20 mg via INTRAVENOUS

## 2017-04-26 MED ORDER — LIDOCAINE HCL (CARDIAC) 20 MG/ML IV SOLN
INTRAVENOUS | Status: DC | PRN
  Administered 2017-04-26: 50 mg via INTRAVENOUS

## 2017-04-26 MED ORDER — PROPOFOL 500 MG/50ML IV EMUL
INTRAVENOUS | Status: DC | PRN
  Administered 2017-04-26: 100 ug/kg/min via INTRAVENOUS

## 2017-04-26 MED ORDER — ONDANSETRON HCL 4 MG/2ML IJ SOLN
INTRAMUSCULAR | Status: AC
  Filled 2017-04-26: qty 2

## 2017-04-26 MED ORDER — LACTATED RINGERS IV SOLN
INTRAVENOUS | Status: DC
  Administered 2017-04-26 (×2): via INTRAVENOUS

## 2017-04-26 MED ORDER — DEXAMETHASONE SODIUM PHOSPHATE 10 MG/ML IJ SOLN
INTRAMUSCULAR | Status: DC | PRN
  Administered 2017-04-26: 10 mg via INTRAVENOUS

## 2017-04-26 MED ORDER — PROPOFOL 10 MG/ML IV BOLUS
INTRAVENOUS | Status: AC
Start: 2017-04-26 — End: 2017-04-26
  Filled 2017-04-26: qty 40

## 2017-04-26 NOTE — Brief Op Note (Signed)
04/26/2017  12:50 PM  PATIENT:  [name]  83 y.o. female  PRE-OPERATIVE DIAGNOSIS:  Schatzki's ring of distal esophagus  POST-OPERATIVE DIAGNOSIS:  Gastric erosions, 10 cm Hietal hernia, Biopsies taken in duodenal and stomach and esophageal dilitation done.    PROCEDURE:  Procedure(s): ESOPHAGOGASTRODUODENOSCOPY (EGD) W/ DILATION (N/A)  SURGEON:  Surgeon(s) and Role:    * Ronnette Juniper, MD - Primary  PHYSICIAN ASSISTANT:   ASSISTANTS: none   ANESTHESIA:   MAC  EBL:  Total I/O In: 400 [I.V.:400] Out: -   BLOOD ADMINISTERED:none  DRAINS: none   LOCAL MEDICATIONS USED:  NONE  SPECIMEN:  Biopsy / Limited Resection  DISPOSITION OF SPECIMEN:  PATHOLOGY  COUNTS:  YES  TOURNIQUET:  * No tourniquets in log *  DICTATION: .Dragon Dictation  PLAN OF CARE: Discharge to home after PACU  PATIENT DISPOSITION:  PACU - hemodynamically stable.   Delay start of Pharmacological VTE agent (>24hrs) due to surgical blood loss or risk of bleeding: no

## 2017-04-26 NOTE — Anesthesia Postprocedure Evaluation (Signed)
Anesthesia Post Note  Patient: [name]  Procedure(s) Performed: Procedure(s) (LRB): ESOPHAGOGASTRODUODENOSCOPY (EGD) W/ DILATION (N/A)     Patient location during evaluation: PACU Anesthesia Type: MAC Level of consciousness: awake and alert Pain management: pain level controlled Vital Signs Assessment: post-procedure vital signs reviewed and stable Respiratory status: spontaneous breathing, nonlabored ventilation, respiratory function stable and patient connected to nasal cannula oxygen Cardiovascular status: stable and blood pressure returned to baseline Anesthetic complications: no    Last Vitals:  Vitals:   04/26/17 1320 04/26/17 1330  BP: (!) 200/61 (!) 171/96  Pulse: 70 (!) 59  Resp: (!) 27 17  Temp:    SpO2: 95% 99%    Last Pain:  Vitals:   04/26/17 1254  TempSrc: Oral                 HODIERNE,ADAM S

## 2017-04-26 NOTE — Anesthesia Preprocedure Evaluation (Signed)
Anesthesia Evaluation  Patient identified by MRN, date of birth, ID band Patient awake    Reviewed: Allergy & Precautions, H&P , NPO status , Patient's Chart, lab work & pertinent test results  Airway Mallampati: II   Neck ROM: full    Dental   Pulmonary neg pulmonary ROS,    breath sounds clear to auscultation       Cardiovascular hypertension, + CAD   Rhythm:regular Rate:Normal     Neuro/Psych    GI/Hepatic   Endo/Other    Renal/GU      Musculoskeletal  (+) Arthritis ,   Abdominal   Peds  Hematology   Anesthesia Other Findings   Reproductive/Obstetrics                             Anesthesia Physical Anesthesia Plan  ASA: III  Anesthesia Plan: MAC   Post-op Pain Management:    Induction: Intravenous  PONV Risk Score and Plan: 2 and Ondansetron, Dexamethasone, Propofol infusion and Treatment may vary due to age or medical condition  Airway Management Planned: Nasal Cannula  Additional Equipment:   Intra-op Plan:   Post-operative Plan:   Informed Consent: I have reviewed the patients History and Physical, chart, labs and discussed the procedure including the risks, benefits and alternatives for the proposed anesthesia with the patient or authorized representative who has indicated his/her understanding and acceptance.     Plan Discussed with: CRNA, Anesthesiologist and Surgeon  Anesthesia Plan Comments:         Anesthesia Quick Evaluation

## 2017-04-26 NOTE — Op Note (Signed)
[facility] Patient Name: [name] Procedure Date: 04/26/2017 MRN: [id number] Attending MD: Ronnette Juniper , MD Date of Birth: [birth year] CSN: [id number] Age: 83 Admit Type: Outpatient Procedure:                Upper GI endoscopy Indications:              Epigastric abdominal pain, Dysphagia, Nausea Providers:                Ronnette Juniper, MD, Elmer Ramp. Tilden Dome, RN, Cletis Athens,                            Technician Referring MD:              Medicines:                Monitored Anesthesia Care Complications:            No immediate complications. Estimated Blood Loss:     Estimated blood loss was minimal. Procedure:                Pre-Anesthesia Assessment:                           - Prior to the procedure, a History and Physical                            was performed, and patient medications and                            allergies were reviewed. The patient's tolerance of                            previous anesthesia was also reviewed. The risks                            and benefits of the procedure and the sedation                            options and risks were discussed with the patient.                            All questions were answered, and informed consent                            was obtained. Prior Anticoagulants: The patient has                            taken aspirin, last dose was 5 days prior to                            procedure. ASA Grade Assessment: III - A patient                            with severe systemic disease. After reviewing the  risks and benefits, the patient was deemed in                            satisfactory condition to undergo the procedure.                           After obtaining informed consent, the endoscope was                            passed under direct vision. Throughout the                            procedure, the patient's blood pressure, pulse, and   oxygen saturations were monitored continuously. The                            EG-[id number] ([id number]) scope was introduced through the                            mouth, and advanced to the second part of duodenum.                            The upper GI endoscopy was accomplished without                            difficulty. The patient tolerated the procedure                            well. Scope In: Scope Out: Findings:      The distal esophagus was moderately tortuous.      A low-grade of narrowing Schatzki ring (acquired) was found in the lower       third of the esophagus. A TTS dilator was passed through the scope.       Dilation with an 18-19-20 mm x 8 cm CRE balloon dilator was performed to       18 mm, 19 mm and 20 mm. The dilation site was examined following       endoscope reinsertion and showed complete resolution of luminal       narrowing.      A 10 cm hiatal hernia was present.      Multiple localized, diminutive non-bleeding erosions were found in the       gastric body. There were no stigmata of recent bleeding. These are       likely [facility] lesions- erosions and dimunitive ulcers from mucosal       trauma related to the large hiatal hernia.      Localized mildly erythematous mucosa without bleeding was found in the       gastric antrum. Biopsies were taken with a cold forceps for Helicobacter       pylori testing.      The examined duodenum was normal. Biopsies for histology were taken with       a cold forceps for evaluation of celiac disease.      The cardia and gastric fundus were normal on retroflexion except for       large hiatal hernia. Impression:               -  Tortuous esophagus.                           - Low-grade of narrowing Schatzki ring. Dilated.                           - 10 cm hiatal hernia.                           - Non-bleeding erosive gastropathy.                           - Erythematous mucosa in the antrum. Biopsied.                            - Normal examined duodenum. Biopsied. Moderate Sedation:      Patient did not receive moderate sedation for this procedure, but       instead received monitored anesthesia care. Recommendation:           - Patient has a contact number available for                            emergencies. The signs and symptoms of potential                            delayed complications were discussed with the                            patient. Return to normal activities tomorrow.                            Written discharge instructions were provided to the                            patient.                           - Resume regular diet.                           - Continue present medications.                           - Await pathology results.                           - Resume aspirin at prior dose tomorrow.                           - Aggressive anti reflux measures such as weight                            loss, elevate head end of the bed during sleep,                            avoid or limit caffeinated products and space last  meal of the day and bedtime by at least 3 hours.                           - Use Protonix (pantoprazole) 40 mg PO daily                            indefinitely. Procedure Code(s):        --- Professional ---                           [phone], Esophagogastroduodenoscopy, flexible,                            transoral; with transendoscopic balloon dilation of                            esophagus (less than 30 mm diameter)                           43239, Esophagogastroduodenoscopy, flexible,                            transoral; with biopsy, single or multiple Diagnosis Code(s):        --- Professional ---                           Q39.9, Congenital malformation of esophagus,                            unspecified                           K22.2, Esophageal obstruction                           K44.9, Diaphragmatic hernia without  obstruction or                            gangrene                           K31.89, Other diseases of stomach and duodenum                           R10.13, Epigastric pain                           R13.10, Dysphagia, unspecified                           R11.0, Nausea CPT copyright 2016 American Medical Association. All rights reserved. The codes documented in this report are preliminary and upon coder review may  be revised to meet current compliance requirements. Ronnette Juniper, MD 04/26/2017 12:56:39 PM This report has been signed electronically. Number of Addenda: 0

## 2017-04-26 NOTE — Discharge Instructions (Signed)

## 2017-04-26 NOTE — Op Note (Signed)
EGD reveals a tortuous distal esophagus. Low-grade narrowing which Schatzki's ring was noted at 30 cm from insertion. A large 10 cm hiatal hernia was noted. 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 Mildly erythematous antrum noted, biopsies taken to rule out H. Pylori. The duodenal bulb and the rest of the duodenum appeared unremarkable, biopsies taken to rule out celiac disease.   Recommend: Antireflux measures-avoid late-night meals/avoided limit caffeinated products/limit use of NSAIDs. Continue PPI such as Protonix indefinitely. Biopsies to be followed as an outpatient.   Ronnette Juniper, M.D.

## 2017-04-26 NOTE — Interval H&P Note (Signed)
History and Physical Interval Note:  04/26/2017 12:14 PM  [name]  has presented today for EGD with dilatation, with the diagnosis of Schatzki's ring of distal esophagus  The various methods of treatment have been discussed with the patient and family. After consideration of risks, benefits and other options for treatment, the patient has consented to  Procedure(s): ESOPHAGOGASTRODUODENOSCOPY (EGD) W/ DILATION (N/A) as a surgical intervention .  The patient's history has been reviewed, patient examined, no change in status, stable for surgery.  I have reviewed the patient's chart and labs.  Questions were answered to the patient's satisfaction.     Ronnette Juniper

## 2017-04-26 NOTE — H&P (Signed)
History of Present Illness  General:  88/female was referred for persistent nausea. EGD from 2006 showed a Schatzki's ring dilated to 16 mm, small hiatal hernia and remaining UGI tract was normal. Patient was advised to take protonix. CMP from 5/18 showed aBU nof 29, low GFR and CBC was normal. She has nausea all the time, has been worse this week, is intermittent, mostly in the morning, associated with dry heaves in the afternoon, this has been ongoing for a year almost. She has not taken any medication for that. She has a choking spell last week and it occurs intermittently, she may have problem swallowing a pill a few days ago.She feels like food gets stuck in the epigastrium. She has mild acid and has heartburn and has not needed any medications. She reports a fair appetite, this month she has been eating better and she feels full after lunch and wants to avoid dinner. Denies weight loss. She lost 18 lbs after a cholecystectomy 3-4 years ago and has been able to maintain her weight. She has constipation and reports a BMs after taking miralax and stool softners at least once a week. Stool consistency is solid to soft, she reports hard and pellet like stools and need to strain. She denies blood in stool or black stools. Nausea is not related to food, usually occurs in the morning. She hasgeneralized "soreness" all inside her belly.   Vital Signs  Wt 170.9, Wt change 11.9 lb, Ht 62, BMI 31.25, Temp 99.4, Pulse sitting 60, BP sitting 170/80.   Examination  Gastroenterology:: GENERAL APPEARANCE: Well developed, well nourished, no active distress, pleasant, no acute distress .  EYES: Lids and conjunctiva normal. Sclera normal, pupils equal and reactive .  ORAL CAVITY: Lips, teeth and gums are normal. Pharynx, tongue, mucosa normal .  SCLERA: anicteric .  NECK Full ROM, trachea midline, no thyromegaly or masses .  CARDIOVASCULAR PMI LS border. Normal RRR w/o murmers or gallops. No peripheral  edema .  RESPIRATORY Breath sounds normal. Respiration even and unlabored .  ABDOMEN No masses palpated. Liver and spleen not palpated, normal. Bowel sounds normal, Abdomen not distended .  EXTREMITIES: No edema, pulses intact .  NEURO: normal strength and reflexes, cranial nerves II-XII grossly intact, uses a walker, she is independent and drives by herself.  PSYCH: mood/affect normal .     Assessments   1. Schatzki's ring of distal esophagus - K22.2 (Primary)   2. Constipation, unspecified constipation type - K59.00   3. Nausea - R11.0   4. Epigastric pain - R10.13   Treatment  1. Schatzki's ring of distal esophagus  IMAGING: Esophagoscopy    Whitfield,Dia 03/23/2017 02:31:51 PM > spoke with Kendall-scheduled for 04/26/17-[facility] at 11:30 am-prep instructions given   Notes: Wi schedule for a therapeutic EGD and dilatation at [facility], advised to stop ASA 325 mg for 5 days before the procedure, ok to use ASA 81 mg meanwhile.  Referral To: Reason:egd w/dil-spoke with Kendall-[facility]-#[id number]    2. Constipation, unspecified constipation type  Notes: Advsied to take miralax and stool softner on a more regular basis, everyday or every other day along with high fiber diet and increased fluid intake.    3. Nausea  Start Zofran Tablet, 4 MG, 1 tablet as needed, Orally, three times a day, 90 days, 270 Tablet, Refills 0 Start Protonix Tablet Delayed Release, 40 MG, 1 tablet, 30 minutes before breakfast, Orally, Once a day, 90 days, 90 Tablet, Refills 1 Notes: ?etiology, H pylori infection?IBS, celiac  disease. Advised to take zofran PRN and start protonix 40 mg a day.    4. Epigastric pain  Notes: ?H pylori infection, IBS acid reflux. Will schedule a diagnostic EGD and biopies. Advised low residue diet, small frequent meals, avoid caffeine and tannins and late night meals.    Current Medications  Taking   PreserVision AREDS 2 2 Capsule 2 Orally   Aspirin 325 MG Tablet 1 tablet Orally Once a day    Hydrochlorothiazide 25 Tablet take 1 tablet by mouth every day Orally Once a day   Alprazolam 0.5 Milligram Tablet take 1 tablet by mouth every night at bedtime Orally as needed   MiraLax(Polyethylene Glycol 3350) - Powder 17 g Orally Once a day   Biotin 5000(Biotin) 5 MG Capsule 1 capsule hold Once a day   Discontinued   Bactrim DS 800-160 mg 3 d DS 800-160 mg Tabs one tab orally twice a day   Colace(Docusate Sodium) 100 MG Capsule 2 capsule Orally Once a day   Nitroglycerin 0.4 mg 0.4 mg tablet 1 tablet as directed SL as directed prn chest pain   Fish Oil 1000 MG Capsule 1 capsule with a meal Orally twice daily   Vitamin D 2000 UNIT Tablet 1 tablet with a meal hold Once a day   Medication List reviewed and reconciled with the patient    Past Medical History  coronary artery disease catheterization April 2002 60% LAD, 60% first diagona ( Dr Tamala Julian); 2013-- cardiolite with a moderate area of severe ischemia..   hypercholesterolemia goal LDL less than 70 intolerant of medication.   GERD.   Carpal tunnel syndrome.   Hypertension.   DJD.   Peptic ulcer disease.   Migraines.   ITP status post splenectomy.   Macular degeneration.   right carotid bruit normal Doppler.   Diverticulosis.   urinary incontinence Dr Alona Bene.   Hypercalcemia stable since 2004 question mild hyperparathyroidism.   ortho Dr Maureen Ralphs.   opthal Dr Herbert Deaner.   iron deficiency anemia 03/2010 had colonoscopy and EGD 05/2010.   stage III renal disease.   large hiatal hernia on EGD 05/2010.   urology Dr Amalia Hailey.   cardiology Dr Pernell Dupre.   vitamin D deficiency 10/2012.   Iron deficiency hemoglobin 9.8 on 05/2015.   Abdominal wall hernia right.   03/2016 liver mass bx hematopoetic ? due to [facility]- hematology Dr Osker Mason 04/2016 and 02/2017.    Surgical History  right carpal tunnel-Dr. Daylene Katayama July 2007  splenectomy 1995  bladder suspension procedure 1994 and 1996  hysterectomy    unilateral oophorectomy   bilateral cataracts Dr Herbert Deaner 07/2008  Cholecystectomy 06/2013   Family History  Father: deceased 18 yrs, Stroke, diagnosed with CVA, Hypertension  Mother: deceased 11 yrs, Ovarian cancer  Sister 1: alive 43 yrs, A + W, diagnosed with Colon cancer  Son(s): alive  Son [name] status post CABG, MYOSITIS he RETIRED at [facility] [employer] home in charge of the facilities No Family History of Colon Cancer, Polyps, or Liver Disease.   Social History  General:  Tobacco use  cigarettes: Never smoked Tobacco history last updated 03/23/2017 Additional Findings: Tobacco Non-User Non-smoker for personal reasons no EXPOSURE TO PASSIVE SMOKE.  no Alcohol.  Caffeine: yes.  no Recreational drug use.  Marital Status: single, Husband Pieter Partridge deceased died suddenly.  OCCUPATION: unemployed, Retired Armed forces logistics/support/administrative officer.  Lives at the [facility] home son [name] head of maintenance.   Allergies  PredniSONE  Zocor  Zithromax  Codeine Phosphate  Lipitor  Pravachol  Lescol  Crestor  Zetia  WelChol  Feldene  Prilosec  Nexium   Hospitalization/Major Diagnostic Procedure  Hypotension, ARF and UTI 07/2013  Not in the past yr. 03/2017   Review of Systems  CONSTITUTIONAL:  Chills No. Fatigue YES. Fever No. Insomnia No. Night sweats No. Weight change No.  HEENT:  Change in vision No. Double vision No . Hoarseness No. Nose bleeds No. sore throat No. Sinus Problems No. Glaucoma No.  CARDIOLOGY:  ByPass Surgery No. Poor Circulation No. Artificial Heart Valves No. High blood pressure No. History of Heart attack No. Irregular heart beat No. Known coronary artery disease No. Pacemaker/Defibrillator No.  RESPIRATORY:  Shortness of breath YES. Cough No. Excessive Sputum No. Using Oxygen No. Asthma No. Bronchitis No. Pneumonia No. Sleep Apnea No.  UROLOGY:  Interstitials Cystitis No. Incontinence YES. Blood in urine No. Difficulty urinating No. Kidney disease YES. Kidney stones No.   GASTROENTEROLOGY:  Abdominal pain YES. Acid reflux YES. Black stools No. Bloating/belching YES. Change in bowel habits No. Constipation YES. Dark tarry stools No. Diarrhea No. Difficulty swallowing YES. Heartburn No. Incontinence of stool No. Indigestion: YES. Lactose intolerance No. Nausea YES. Rectal bleeding YES. Vomiting YES. Hepatitis/yellow jaundice No. History of Ulcers No. Use of Pain Medication No. Previous Colonoscopy No.  MUSCULOSKELETAL:  joint pain YES. Arthritis YES. Joint Replacement No.  NEUROLOGY:  Dizziness No. Fainting No. Headache YES. Paralysis No. Seizures No. Strokes No. Weakness YES. Alzheimer's No.  SKIN:  Rash No. Bruises easily No.  ENDOCRINOLOGY:  Diabetes No. High cholesterol No. Thyroid disorder No.  HEMATOLOGY/LYMPH:  Abnormal bleeding No. Anemia YES. Enlarged lymph nodes No. Past blood transfusion No. Swollen glands No. Blood Clots No. Using Blood Thinners YES.  INFECTIOUS DISEASE:  HIV/AIDS No. Tuberculosis No . Hepatitis No. Sexually Transmitted Disease No. MRSA No.  GI PROCEDURE:  no Pacemaker/ AICD, no. no Artificial heart valves. no MI/heart attack. no Abnormal heart rhythm. no Angina. no CVA. Hypertension YES. no Hypotension. no Asthma, COPD. no Sleep apnea. no Seizure disorders. no Artificial joints. no Severe DJD. no Diabetes. no Significant headaches. no Vertigo. no Depression/anxiety. no Abnormal bleeding. no Kidney Disease. no Liver disease, no. no Chance of pregnancy. no Blood transfusion. no Method of Birth Control. no Birth control pills.  GU/GYN:  Pregnant Now No. Trying to conceive No. Use Birth Control No. Heavy Periods No.     Gari Crown

## 2017-04-26 NOTE — Transfer of Care (Signed)
Immediate Anesthesia Transfer of Care Note  Patient: [name]  Procedure(s) Performed: Procedure(s): ESOPHAGOGASTRODUODENOSCOPY (EGD) W/ DILATION (N/A)  Patient Location: PACU  Anesthesia Type:MAC  Level of Consciousness:  sedated, patient cooperative and responds to stimulation  Airway & Oxygen Therapy:Patient Spontanous Breathing and Patient connected to face mask oxgen  Post-op Assessment:  Report given to PACU RN and Post -op Vital signs reviewed and stable  Post vital signs:  Reviewed and stable  Last Vitals:  Vitals:   04/26/17 1032  BP: (!) 190/54  Pulse: 98  Resp: 18  Temp: 36.6 C  SpO2: 63%    Complications: No apparent anesthesia complications

## 2017-04-28 ENCOUNTER — Encounter (HOSPITAL_COMMUNITY): Payer: Self-pay | Admitting: Gastroenterology

## 2017-05-10 ENCOUNTER — Ambulatory Visit (INDEPENDENT_AMBULATORY_CARE_PROVIDER_SITE_OTHER): Payer: Medicare Other | Admitting: Orthopaedic Surgery

## 2017-05-10 ENCOUNTER — Encounter (INDEPENDENT_AMBULATORY_CARE_PROVIDER_SITE_OTHER): Payer: Self-pay | Admitting: Orthopaedic Surgery

## 2017-05-10 VITALS — BP 148/64 | HR 59 | Resp 12 | Ht 63.0 in | Wt 165.0 lb

## 2017-05-10 DIAGNOSIS — M1712 Unilateral primary osteoarthritis, left knee: Secondary | ICD-10-CM | POA: Diagnosis not present

## 2017-05-10 MED ORDER — LIDOCAINE HCL 1 % IJ SOLN
5.0000 mL | INTRAMUSCULAR | Status: AC | PRN
  Administered 2017-05-10: 5 mL

## 2017-05-10 MED ORDER — METHYLPREDNISOLONE ACETATE 40 MG/ML IJ SUSP
80.0000 mg | INTRAMUSCULAR | Status: AC | PRN
  Administered 2017-05-10: 80 mg

## 2017-05-10 MED ORDER — BUPIVACAINE HCL 0.5 % IJ SOLN
3.0000 mL | INTRAMUSCULAR | Status: AC | PRN
  Administered 2017-05-10: 3 mL via INTRA_ARTICULAR

## 2017-05-10 NOTE — Progress Notes (Signed)
Office Visit Note   Patient: [name]           Date of Birth: 10/21/1928           MRN: [id number] Visit Date: 05/10/2017              Requested by: Lajean Manes, MD [address] [place], [place] [address] PCP: Lajean Manes, MD   Assessment & Plan: Visit Diagnoses:  1. Unilateral primary osteoarthritis, left knee     Plan: epeat cortisone injection lateral compartment left knee. Follow up 3-4 weeks if no improvement and consider Visco supplementation  Follow-Up Instructions: Return if symptoms worsen or fail to improve.   Orders:  Orders Placed This Encounter  Procedures  . Large Joint Injection/Arthrocentesis   No orders of the defined types were placed in this encounter.     Procedures: Large Joint Inj Date/Time: 05/10/2017 2:03 PM Performed by: Garald Balding Authorized by: Garald Balding   Consent Given by:  Patient Timeout: prior to procedure the correct patient, procedure, and site was verified   Indications:  Pain and joint swelling Location:  Knee Site:  L knee Prep: patient was prepped and draped in usual sterile fashion   Needle Size:  25 G Needle Length:  1.5 inches Approach:  Anteromedial Ultrasound Guidance: No   Fluoroscopic Guidance: No   Arthrogram: No   Medications:  5 mL lidocaine 1 %; 80 mg methylPREDNISolone acetate 40 MG/ML; 3 mL bupivacaine 0.5 % Aspiration Attempted: No   Patient tolerance:  Patient tolerated the procedure well with no immediate complications     Clinical Data: No additional findings.   Subjective: Chief Complaint  Patient presents with  . Left Knee - Pain, Edema    [name] is an 83 y o that is here for Left knee pain x 2 weeks. Denies injury, swelling or numbness.AMbulates with a walker.  seen in February 2018 with a diagnosis of osteoarthritis in all 3 compartments left knee but predominantly lateral compartment. Responded well to cortisone injection. Does use a walker for  "balance". Would like another cortisone inj.No history of  chills or fever or lower extremity swelling since last visit  HPI  Review of Systems  Constitutional: Negative for chills, fatigue and fever.  Eyes: Negative for itching.  Respiratory: Negative for chest tightness and shortness of breath.   Cardiovascular: Negative for chest pain, palpitations and leg swelling.  Gastrointestinal: Negative for blood in stool, constipation and diarrhea.  Endocrine: Negative for polyuria.  Genitourinary: Negative for dysuria.  Musculoskeletal: Negative for back pain, joint swelling, neck pain and neck stiffness.  Allergic/Immunologic: Negative for immunocompromised state.  Neurological: Positive for headaches. Negative for dizziness and numbness.  Hematological: Does not bruise/bleed easily.  Psychiatric/Behavioral: The patient is not nervous/anxious.      Objective: Vital Signs: BP (!) 148/64   Pulse (!) 59   Resp 12   Ht 5\' 3"  (1.6 m)   Wt 165 lb (74.8 kg)   BMI 29.23 kg/m   Physical Exam  Ortho Examleft knee without effusion. Slight increased valgus with weightbearing. Predominantly lateral joint pain. No crepitation. Minimal popliteal fullness but nopain. No calf discomfort. No distal edema. Neurovascular exam intact. Skin intact. Painless ROM both hips. Straight leg raise negative  Specialty Comments:  No specialty comments available.  Imaging: No results found.   PMFS History: Patient Active Problem List   Diagnosis Date Noted  . Hyperlipidemia 05/20/2014  . Hypotension 08/02/2013  .  UTI (lower urinary tract infection) 08/02/2013  . Acute renal failure ([facility]) 08/02/2013  . ARF (acute renal failure) ([facility]) 08/02/2013  . Calculus of gallbladder with acute cholecystitis, without mention of obstruction 07/10/2013  . Acute cholecystitis 06/17/2013  . Chest pain 06/17/2013  . Pulmonary infiltrate in left lung on chest x-ray 06/17/2013  . Hypertension   . CAD (coronary artery  disease)    Past Medical History:  Diagnosis Date  . Arthritis   . CAD (coronary artery disease)    a. Mod prox LAD & first diagonal stenosis by cath 2002 - managed medically since that time with normal LV function.  . Cataract   . Hyperlipidemia   . Hypertension     Family History  Problem Relation Age of Onset  . Heart disease Mother        "enlarged valve"  . Cancer Mother        Ovarian  . Stroke Father   . Cancer Sister        Colon    Past Surgical History:  Procedure Laterality Date  . ABDOMINAL HYSTERECTOMY    . APPENDECTOMY    . BACK SURGERY    . CARPAL TUNNEL RELEASE    . CHOLECYSTECTOMY N/A 06/19/2013   Procedure: LAPAROSCOPIC CHOLECYSTECTOMY;  Surgeon: Harl Bowie, MD;  Location: [facility];  Service: General;  Laterality: N/A;  . ESOPHAGOGASTRODUODENOSCOPY N/A 04/26/2017   Procedure: ESOPHAGOGASTRODUODENOSCOPY (EGD) W/ DILATION;  Surgeon: Ronnette Juniper, MD;  Location: [facility];  Service: Gastroenterology;  Laterality: N/A;  . HERNIA REPAIR    . IR GENERIC HISTORICAL  03/18/2016   IR RADIOLOGIST EVAL & MGMT 03/18/2016 Corrie Mckusick, DO [facility] INTERV RAD  . KNEE SURGERY    . SHOULDER SURGERY    . SPLENECTOMY, TOTAL     Social History   Occupational History  . Not on file.   Social History Main Topics  . Smoking status: Never Smoker  . Smokeless tobacco: Never Used  . Alcohol use No  . Drug use: No  . Sexual activity: Not on file

## 2017-05-18 DIAGNOSIS — Z23 Encounter for immunization: Secondary | ICD-10-CM | POA: Diagnosis not present

## 2017-05-19 DIAGNOSIS — R51 Headache: Secondary | ICD-10-CM | POA: Diagnosis not present

## 2017-05-19 DIAGNOSIS — I129 Hypertensive chronic kidney disease with stage 1 through stage 4 chronic kidney disease, or unspecified chronic kidney disease: Secondary | ICD-10-CM | POA: Diagnosis not present

## 2017-05-19 DIAGNOSIS — Z79899 Other long term (current) drug therapy: Secondary | ICD-10-CM | POA: Diagnosis not present

## 2017-05-19 DIAGNOSIS — R110 Nausea: Secondary | ICD-10-CM | POA: Diagnosis not present

## 2017-05-19 DIAGNOSIS — N183 Chronic kidney disease, stage 3 (moderate): Secondary | ICD-10-CM | POA: Diagnosis not present

## 2017-05-31 DIAGNOSIS — N183 Chronic kidney disease, stage 3 (moderate): Secondary | ICD-10-CM | POA: Diagnosis not present

## 2017-05-31 DIAGNOSIS — D649 Anemia, unspecified: Secondary | ICD-10-CM | POA: Diagnosis not present

## 2017-05-31 DIAGNOSIS — I129 Hypertensive chronic kidney disease with stage 1 through stage 4 chronic kidney disease, or unspecified chronic kidney disease: Secondary | ICD-10-CM | POA: Diagnosis not present

## 2017-05-31 DIAGNOSIS — I2510 Atherosclerotic heart disease of native coronary artery without angina pectoris: Secondary | ICD-10-CM | POA: Diagnosis not present

## 2017-06-27 DIAGNOSIS — Z1231 Encounter for screening mammogram for malignant neoplasm of breast: Secondary | ICD-10-CM | POA: Diagnosis not present

## 2017-07-11 DIAGNOSIS — H40013 Open angle with borderline findings, low risk, bilateral: Secondary | ICD-10-CM | POA: Diagnosis not present

## 2017-07-11 DIAGNOSIS — H35363 Drusen (degenerative) of macula, bilateral: Secondary | ICD-10-CM | POA: Diagnosis not present

## 2017-07-11 DIAGNOSIS — H35033 Hypertensive retinopathy, bilateral: Secondary | ICD-10-CM | POA: Diagnosis not present

## 2017-07-11 DIAGNOSIS — Z961 Presence of intraocular lens: Secondary | ICD-10-CM | POA: Diagnosis not present

## 2017-07-13 ENCOUNTER — Ambulatory Visit (INDEPENDENT_AMBULATORY_CARE_PROVIDER_SITE_OTHER): Payer: Medicare Other | Admitting: Orthopedic Surgery

## 2017-07-13 ENCOUNTER — Encounter (INDEPENDENT_AMBULATORY_CARE_PROVIDER_SITE_OTHER): Payer: Self-pay | Admitting: Orthopedic Surgery

## 2017-07-13 VITALS — BP 193/69 | HR 55 | Resp 16 | Ht 63.0 in | Wt 167.0 lb

## 2017-07-13 DIAGNOSIS — M1712 Unilateral primary osteoarthritis, left knee: Secondary | ICD-10-CM | POA: Diagnosis not present

## 2017-07-13 MED ORDER — BUPIVACAINE HCL 0.5 % IJ SOLN
2.0000 mL | INTRAMUSCULAR | Status: AC | PRN
  Administered 2017-07-13: 2 mL via INTRA_ARTICULAR

## 2017-07-13 MED ORDER — METHYLPREDNISOLONE ACETATE 40 MG/ML IJ SUSP
80.0000 mg | INTRAMUSCULAR | Status: AC | PRN
  Administered 2017-07-13: 80 mg

## 2017-07-13 MED ORDER — LIDOCAINE HCL 1 % IJ SOLN
2.0000 mL | INTRAMUSCULAR | Status: AC | PRN
  Administered 2017-07-13: 2 mL

## 2017-07-13 NOTE — Progress Notes (Signed)
Office Visit Note   Patient: [name]           Date of Birth: 09-03-1928           MRN: [id number] Visit Date: 07/13/2017              Requested by: Lajean Manes, MD [address] [place], [place] [address] PCP: Lajean Manes, MD   Assessment & Plan: Visit Diagnoses:  1. Unilateral primary osteoarthritis, left knee     Plan:  #1: We will give her a corticosteroid injection as per her request. I told her that this will probably be last one for a while. We'll try to order Visco supplementation but we'll need to do that after the first of the year.  Follow-Up Instructions: No Follow-up on file.   Orders:  Orders Placed This Encounter  Procedures  . Large Joint Inj   No orders of the defined types were placed in this encounter.     Procedures: Large Joint Inj: L knee on 07/13/2017 1:03 PM Indications: pain and diagnostic evaluation Details: 25 G 1.5 in needle, anteromedial approach  Arthrogram: No  Medications: 2 mL lidocaine 1 %; 2 mL bupivacaine 0.5 %; 80 mg methylPREDNISolone acetate 40 MG/ML Procedure, treatment alternatives, risks and benefits explained, specific risks discussed. Consent was given by the patient. Patient was prepped and draped in the usual sterile fashion.       Clinical Data: No additional findings.   Subjective: Chief Complaint  Patient presents with  . Left Knee - Pain  . Knee Pain    Calcium high, left knee pain, swelling, popping, clicking, grinding noises, difficulty walking, difficulty sleeping at night, no injury, not diabetic, no surgery to left knee    [name] is a very pleasant 83 -year-old white female who presents today with pain and discomfort in her left knee. She has been previously seen for osteoarthritis of the left knee and has had corticosteroid injections. Her last was on 05/10/2017. She's had the some relief for a very short time. She wants to have another injection today. Her complaints are that  of a swelling, popping, clicking, grinding noises as well as difficulty with sleeping and walking.      Review of Systems  Constitutional: Positive for activity change.  HENT: Negative for trouble swallowing.   Eyes: Negative for pain.  Respiratory: Negative for shortness of breath.   Cardiovascular: Positive for leg swelling.  Gastrointestinal: Positive for constipation.  Endocrine: Positive for cold intolerance.  Genitourinary: Negative for frequency.  Musculoskeletal: Positive for back pain, gait problem and joint swelling.  Skin: Negative for rash.  Neurological: Negative for numbness.  Hematological: Bruises/bleeds easily.  Psychiatric/Behavioral: Positive for sleep disturbance.     Objective: Vital Signs: BP (!) 193/69 (BP Location: Right Arm, Patient Position: Sitting, Cuff Size: Normal)   Pulse (!) 55   Resp 16   Ht 5\' 3"  (1.6 m)   Wt 167 lb (75.8 kg)   BMI 29.58 kg/m   Physical Exam  Constitutional: She is oriented to person, place, and time. She appears well-developed and well-nourished.  HENT:  Head: Normocephalic and atraumatic.  Eyes: EOM are normal. Pupils are equal, round, and reactive to light.  Pulmonary/Chest: Effort normal.  Neurological: She is alert and oriented to person, place, and time.  Skin: Skin is warm and dry.  Psychiatric: She has a normal mood and affect. Her behavior is normal. Judgment and thought content normal.    Ortho  Exam  Range of motion reveals 5-7 shy of full extension with flexion to about 95. Tender over both joint lines more lateral than medial. Calf supple nontender. Slight Baker's cyst and posteriorly.  Specialty Comments:  No specialty comments available.  Imaging: No results found.   PMFS History: Patient Active Problem List   Diagnosis Date Noted  . Hyperlipidemia 05/20/2014  . Hypotension 08/02/2013  . UTI (lower urinary tract infection) 08/02/2013  . Acute renal failure ([facility]) 08/02/2013  . ARF (acute  renal failure) ([facility]) 08/02/2013  . Calculus of gallbladder with acute cholecystitis, without mention of obstruction 07/10/2013  . Acute cholecystitis 06/17/2013  . Chest pain 06/17/2013  . Pulmonary infiltrate in left lung on chest x-ray 06/17/2013  . Hypertension   . CAD (coronary artery disease)    Past Medical History:  Diagnosis Date  . Arthritis   . CAD (coronary artery disease)    a. Mod prox LAD & first diagonal stenosis by cath 2002 - managed medically since that time with normal LV function.  . Cataract   . Hyperlipidemia   . Hypertension     Family History  Problem Relation Age of Onset  . Heart disease Mother        "enlarged valve"  . Cancer Mother        Ovarian  . Stroke Father   . Cancer Sister        Colon    Past Surgical History:  Procedure Laterality Date  . ABDOMINAL HYSTERECTOMY    . APPENDECTOMY    . BACK SURGERY    . CARPAL TUNNEL RELEASE    . CHOLECYSTECTOMY N/A 06/19/2013   Procedure: LAPAROSCOPIC CHOLECYSTECTOMY;  Surgeon: Harl Bowie, MD;  Location: [facility];  Service: General;  Laterality: N/A;  . ESOPHAGOGASTRODUODENOSCOPY N/A 04/26/2017   Procedure: ESOPHAGOGASTRODUODENOSCOPY (EGD) W/ DILATION;  Surgeon: Ronnette Juniper, MD;  [facility];  Service: Gastroenterology;  Laterality: N/A;  . HERNIA REPAIR    . IR GENERIC HISTORICAL  03/18/2016   IR RADIOLOGIST EVAL & MGMT 03/18/2016 Corrie Mckusick, DO [facility] INTERV RAD  . KNEE SURGERY    . SHOULDER SURGERY    . SPLENECTOMY, TOTAL     Social History   Occupational History  . Not on file  Tobacco Use  . Smoking status: Never Smoker  . Smokeless tobacco: Never Used  Substance and Sexual Activity  . Alcohol use: No  . Drug use: No  . Sexual activity: Not on file

## 2017-07-18 ENCOUNTER — Telehealth (INDEPENDENT_AMBULATORY_CARE_PROVIDER_SITE_OTHER): Payer: Self-pay | Admitting: Orthopaedic Surgery

## 2017-07-18 NOTE — Telephone Encounter (Signed)
Please advise and I can call.

## 2017-07-18 NOTE — Telephone Encounter (Signed)
Thurs or Fri

## 2017-07-18 NOTE — Telephone Encounter (Signed)
Patient called this morning stating that the injection Aaron Edelman gave her did not work.  She would like to see Dr. Durward Fortes this week for another injection.  She is not able to come today or Wednesday.  She stated that someone told her we would fit her into his schedule this week.  CB#[phone] after 1:00.  Thank you.

## 2017-07-18 NOTE — Telephone Encounter (Signed)
Please schedule injection for Wed 12/5 at 1:00

## 2017-07-21 NOTE — Telephone Encounter (Signed)
Patient has appointment scheduled for Friday 07/22/17.

## 2017-07-22 ENCOUNTER — Encounter (INDEPENDENT_AMBULATORY_CARE_PROVIDER_SITE_OTHER): Payer: Self-pay | Admitting: Orthopaedic Surgery

## 2017-07-22 ENCOUNTER — Ambulatory Visit (INDEPENDENT_AMBULATORY_CARE_PROVIDER_SITE_OTHER): Payer: Medicare Other | Admitting: Orthopaedic Surgery

## 2017-07-22 DIAGNOSIS — M170 Bilateral primary osteoarthritis of knee: Secondary | ICD-10-CM | POA: Insufficient documentation

## 2017-07-22 MED ORDER — SODIUM HYALURONATE (VISCOSUP) 20 MG/2ML IX SOSY
20.0000 mg | PREFILLED_SYRINGE | INTRA_ARTICULAR | Status: AC | PRN
Start: 2017-07-22 — End: 2017-07-22
  Administered 2017-07-22: 20 mg via INTRA_ARTICULAR

## 2017-07-22 MED ORDER — LIDOCAINE HCL 1 % IJ SOLN
2.0000 mL | INTRAMUSCULAR | Status: AC | PRN
  Administered 2017-07-22: 2 mL

## 2017-07-22 NOTE — Progress Notes (Signed)
Office Visit Note   Patient: [name]           Date of Birth: [birth year]           MRN: [id number] Visit Date: 07/22/2017              Requested by: Lajean Manes, MD [address] [place],  [address] PCP: Lajean Manes, MD   Assessment & Plan: Visit Diagnoses:  1. Bilateral primary osteoarthritis of knee     Plan: Primary osteoarthritis both knees. Poor response to cortisone 2 weeks ago left knee. We will initiate U flexor left knee and see her back weekly for the next 2 weeks to complete the series. Will also try a pullover knee support  Follow-Up Instructions: Return in about 1 week (around 07/29/2017).   Orders:  No orders of the defined types were placed in this encounter.  No orders of the defined types were placed in this encounter.     Procedures: Large Joint Inj: L knee on 07/22/2017 11:50 AM Indications: pain and diagnostic evaluation Details: 25 G 1.5 in needle, anteromedial approach  Arthrogram: No  Medications: 2 mL lidocaine 1 %; 20 mg Sodium Hyaluronate 20 MG/2ML Procedure, treatment alternatives, risks and benefits explained, specific risks discussed. Consent was given by the patient. Patient was prepped and draped in the usual sterile fashion.       Clinical Data: No additional findings.   Subjective: No chief complaint on file. Mrs. Hockley has a history of osteoarthritis both knees. She's had good response to cortisone in the past. However, after injecting the left knee 2 weeks ago she still having some discomfort. We will try Visco supplementation  HPI  Review of Systems   Objective: Vital Signs: There were no vitals taken for this visit.  Physical Exam  Ortho Exam awake alert and oriented 3. Comfortable sitting. Does use a walker for ambulatory aid. Little hard of hearing. Left knee was not hot red warm or swollen. Predominantly lateral joint pain particularly with weightbearing. No swelling distally. Skin  intact. No effusion. Good pulses distally. He is negative. Pain is range of motion both hips. Some patella crepitation. Little bit more lateral than medial joint pain  No specialty comments available.  Imaging: No results found.   PMFS History: Patient Active Problem List   Diagnosis Date Noted  . Bilateral primary osteoarthritis of knee 07/22/2017  . Hyperlipidemia 05/20/2014  . Hypotension 08/02/2013  . UTI (lower urinary tract infection) 08/02/2013  . Acute renal failure ([facility]) 08/02/2013  . ARF (acute renal failure) ([facility]) 08/02/2013  . Calculus of gallbladder with acute cholecystitis, without mention of obstruction 07/10/2013  . Acute cholecystitis 06/17/2013  . Chest pain 06/17/2013  . Pulmonary infiltrate in left lung on chest x-ray 06/17/2013  . Hypertension   . CAD (coronary artery disease)    Past Medical History:  Diagnosis Date  . Arthritis   . CAD (coronary artery disease)    a. Mod prox LAD & first diagonal stenosis by cath 2002 - managed medically since that time with normal LV function.  . Cataract   . Hyperlipidemia   . Hypertension     Family History  Problem Relation Age of Onset  . Heart disease Mother        "enlarged valve"  . Cancer Mother        Ovarian  . Stroke Father   . Cancer Sister        Colon  Past Surgical History:  Procedure Laterality Date  . ABDOMINAL HYSTERECTOMY    . APPENDECTOMY    . BACK SURGERY    . CARPAL TUNNEL RELEASE    . CHOLECYSTECTOMY N/A 06/19/2013   Procedure: LAPAROSCOPIC CHOLECYSTECTOMY;  Surgeon: Harl Bowie, MD;  Location: [facility];  Service: General;  Laterality: N/A;  . ESOPHAGOGASTRODUODENOSCOPY N/A 04/26/2017   Procedure: ESOPHAGOGASTRODUODENOSCOPY (EGD) W/ DILATION;  Surgeon: Ronnette Juniper, MD;  [facility];  Service: Gastroenterology;  Laterality: N/A;  . HERNIA REPAIR    . IR GENERIC HISTORICAL  03/18/2016   IR RADIOLOGIST EVAL & MGMT 03/18/2016 Corrie Mckusick, DO [facility] INTERV RAD  . KNEE SURGERY     . SHOULDER SURGERY    . SPLENECTOMY, TOTAL     Social History   Occupational History  . Not on file  Tobacco Use  . Smoking status: Never Smoker  . Smokeless tobacco: Never Used  Substance and Sexual Activity  . Alcohol use: No  . Drug use: No  . Sexual activity: Not on file     Garald Balding, MD   Note - This record has been created using [employer].  Chart creation errors have been sought, but may not always  have been located. Such creation errors do not reflect on  the standard of medical care.

## 2017-07-27 ENCOUNTER — Ambulatory Visit (INDEPENDENT_AMBULATORY_CARE_PROVIDER_SITE_OTHER): Payer: Medicare Other | Admitting: Orthopedic Surgery

## 2017-07-27 ENCOUNTER — Encounter (INDEPENDENT_AMBULATORY_CARE_PROVIDER_SITE_OTHER): Payer: Self-pay | Admitting: Orthopedic Surgery

## 2017-07-27 ENCOUNTER — Telehealth: Payer: Self-pay

## 2017-07-27 DIAGNOSIS — M1712 Unilateral primary osteoarthritis, left knee: Secondary | ICD-10-CM

## 2017-07-27 MED ORDER — SODIUM HYALURONATE (VISCOSUP) 20 MG/2ML IX SOSY
20.0000 mg | PREFILLED_SYRINGE | INTRA_ARTICULAR | Status: AC | PRN
Start: 2017-07-27 — End: 2017-07-27
  Administered 2017-07-27: 20 mg via INTRA_ARTICULAR

## 2017-07-27 MED ORDER — LIDOCAINE HCL 1 % IJ SOLN
2.0000 mL | INTRAMUSCULAR | Status: AC | PRN
  Administered 2017-07-27: 2 mL

## 2017-07-27 NOTE — Progress Notes (Signed)
Office Visit Note   Patient: [name]           Date of Birth: Dec 15, 1928           MRN: [id number] Visit Date: 07/27/2017              Requested by: Lajean Manes, MD [address] [place], [place] [address] PCP: Lajean Manes, MD   Assessment & Plan: Visit Diagnoses:  1. Unilateral primary osteoarthritis, left knee     Plan:  #1: Second Euflex injection was given without difficulty #2: We discussed the possible use of Voltaren gel and she will check with the pharmacy to see what the cost would be.  if she is supposed desires to obtain this and she'll give [place] a call and we can fax it in #3: Follow back up 1 week for the third Euflexa injection to the left knee.  Follow-Up Instructions: Return in about 1 week (around 08/03/2017).   Orders:  No orders of the defined types were placed in this encounter.  No orders of the defined types were placed in this encounter.     Procedures: Large Joint Inj: L knee on 07/27/2017 10:13 AM Indications: pain and joint swelling Details: 25 G 1.5 in needle, anteromedial approach  Arthrogram: No  Medications: 2 mL lidocaine 1 %; 20 mg Sodium Hyaluronate 20 MG/2ML Outcome: tolerated well, no immediate complications Procedure, treatment alternatives, risks and benefits explained, specific risks discussed. Consent was given by the patient. Immediately prior to procedure a time out was called to verify the correct patient, procedure, equipment, support staff and site/side marked as required. Patient was prepped and draped in the usual sterile fashion.       Clinical Data: No additional findings.   Subjective: No chief complaint on file.   HPI  [name] is a very pleasant 83 year old white female who returns today for a her second Euflex injection. She denies any reactivity. Seen today for her second Euflexa injection.   Review of Systems  Constitutional: Positive for activity change.  HENT: Negative for  trouble swallowing.   Eyes: Negative for pain.  Respiratory: Negative for shortness of breath.   Cardiovascular: Positive for leg swelling.  Gastrointestinal: Positive for constipation.  Endocrine: Positive for cold intolerance.  Genitourinary: Negative for frequency.  Musculoskeletal: Positive for back pain, gait problem and joint swelling.  Skin: Negative for rash.  Neurological: Negative for numbness.  Hematological: Bruises/bleeds easily.  Psychiatric/Behavioral: Positive for sleep disturbance.     Objective: Vital Signs: There were no vitals taken for this visit.  Physical Exam  Ortho Exam  Left knee does reveal a trace effusion. No warmth or erythema. No signs of reactivity.  Specialty Comments:  No specialty comments available.  Imaging: No results found.   PMFS History: Patient Active Problem List   Diagnosis Date Noted  . Bilateral primary osteoarthritis of knee 07/22/2017  . Hyperlipidemia 05/20/2014  . Hypotension 08/02/2013  . UTI (lower urinary tract infection) 08/02/2013  . Acute renal failure ([facility]) 08/02/2013  . ARF (acute renal failure) ([facility]) 08/02/2013  . Calculus of gallbladder with acute cholecystitis, without mention of obstruction 07/10/2013  . Acute cholecystitis 06/17/2013  . Chest pain 06/17/2013  . Pulmonary infiltrate in left lung on chest x-ray 06/17/2013  . Hypertension   . CAD (coronary artery disease)    Past Medical History:  Diagnosis Date  . Arthritis   . CAD (coronary artery disease)    a. Mod prox LAD &  first diagonal stenosis by cath 2002 - managed medically since that time with normal LV function.  . Cataract   . Hyperlipidemia   . Hypertension     Family History  Problem Relation Age of Onset  . Heart disease Mother        "enlarged valve"  . Cancer Mother        Ovarian  . Stroke Father   . Cancer Sister        Colon    Past Surgical History:  Procedure Laterality Date  . ABDOMINAL HYSTERECTOMY    .  APPENDECTOMY    . BACK SURGERY    . CARPAL TUNNEL RELEASE    . CHOLECYSTECTOMY N/A 06/19/2013   Procedure: LAPAROSCOPIC CHOLECYSTECTOMY;  Surgeon: Harl Bowie, MD;  Location: [facility];  Service: General;  Laterality: N/A;  . ESOPHAGOGASTRODUODENOSCOPY N/A 04/26/2017   Procedure: ESOPHAGOGASTRODUODENOSCOPY (EGD) W/ DILATION;  Surgeon: Ronnette Juniper, MD;  [facility];  Service: Gastroenterology;  Laterality: N/A;  . HERNIA REPAIR    . IR GENERIC HISTORICAL  03/18/2016   IR RADIOLOGIST EVAL & MGMT 03/18/2016 Corrie Mckusick, DO [facility] INTERV RAD  . KNEE SURGERY    . SHOULDER SURGERY    . SPLENECTOMY, TOTAL     Social History   Occupational History  . Not on file  Tobacco Use  . Smoking status: Never Smoker  . Smokeless tobacco: Never Used  Substance and Sexual Activity  . Alcohol use: No  . Drug use: No  . Sexual activity: Not on file

## 2017-07-27 NOTE — Telephone Encounter (Signed)
Patient would like to proceed with a Rx for Voltaren Gel.  Would like for Rx to be faxed to [facility] at [phone]?  Cb# is [phone].  Please advise.  Thank You.

## 2017-07-28 ENCOUNTER — Other Ambulatory Visit (INDEPENDENT_AMBULATORY_CARE_PROVIDER_SITE_OTHER): Payer: Self-pay

## 2017-07-28 MED ORDER — DICLOFENAC SODIUM 1 % TD GEL: 2.0000 g | Freq: Four times a day (QID) | TRANSDERMAL | 3 refills | Status: DC

## 2017-07-28 NOTE — Telephone Encounter (Signed)
Called pt.

## 2017-08-03 ENCOUNTER — Encounter (INDEPENDENT_AMBULATORY_CARE_PROVIDER_SITE_OTHER): Payer: Self-pay | Admitting: Orthopedic Surgery

## 2017-08-03 ENCOUNTER — Ambulatory Visit (INDEPENDENT_AMBULATORY_CARE_PROVIDER_SITE_OTHER): Payer: Medicare Other | Admitting: Orthopedic Surgery

## 2017-08-03 DIAGNOSIS — M1712 Unilateral primary osteoarthritis, left knee: Secondary | ICD-10-CM | POA: Diagnosis not present

## 2017-08-03 MED ORDER — SODIUM HYALURONATE (VISCOSUP) 20 MG/2ML IX SOSY
20.0000 mg | PREFILLED_SYRINGE | INTRA_ARTICULAR | Status: AC | PRN
  Administered 2017-08-03: 20 mg via INTRA_ARTICULAR

## 2017-08-03 MED ORDER — LIDOCAINE HCL 1 % IJ SOLN
2.0000 mL | INTRAMUSCULAR | Status: AC | PRN
  Administered 2017-08-03: 2 mL

## 2017-08-03 NOTE — Progress Notes (Signed)
Office Visit Note   Patient: [name]           Date of Birth: 1929-04-17           MRN: [id number] Visit Date: 08/03/2017              Requested by: Lajean Manes, MD [address] [place], [place] [address] PCP: Lajean Manes, MD   Assessment & Plan: Visit Diagnoses:  1. Unilateral primary osteoarthritis, left knee     Plan:  #1: Third Euflexa injections given to the left knee. #2: Follow-up when necessary #3: She still has not obtained her Voltaren gel Envision  Follow-Up Instructions: Return if symptoms worsen or fail to improve.   Orders:  No orders of the defined types were placed in this encounter.  No orders of the defined types were placed in this encounter.     Procedures: Large Joint Inj: L knee on 08/03/2017 10:16 AM Indications: pain and joint swelling Details: 25 G 1.5 in needle, anteromedial approach  Arthrogram: No  Medications: 2 mL lidocaine 1 %; 20 mg Sodium Hyaluronate 20 MG/2ML Outcome: tolerated well, no immediate complications Procedure, treatment alternatives, risks and benefits explained, specific risks discussed. Consent was given by the patient. Immediately prior to procedure a time out was called to verify the correct patient, procedure, equipment, support staff and site/side marked as required. Patient was prepped and draped in the usual sterile fashion.       Clinical Data: No additional findings.   Subjective: No chief complaint on file.   HPI  [name] is seen today for her third Euflexa injection. She states she's had some relief. Denies any reactivity  Review of Systems  Constitutional: Positive for activity change.  HENT: Negative for trouble swallowing.   Eyes: Negative for pain.  Respiratory: Negative for shortness of breath.   Cardiovascular: Positive for leg swelling.  Gastrointestinal: Positive for constipation.  Endocrine: Positive for cold intolerance.  Genitourinary: Negative for  frequency.  Musculoskeletal: Positive for back pain, gait problem and joint swelling.  Skin: Negative for rash.  Neurological: Negative for numbness.  Hematological: Bruises/bleeds easily.  Psychiatric/Behavioral: Positive for sleep disturbance.     Objective: Vital Signs: There were no vitals taken for this visit.  Physical Exam  Constitutional: She is oriented to person, place, and time. She appears well-developed and well-nourished.  HENT:  Head: Normocephalic and atraumatic.  Eyes: EOM are normal. Pupils are equal, round, and reactive to light.  Pulmonary/Chest: Effort normal.  Neurological: She is alert and oriented to person, place, and time.  Skin: Skin is warm and dry.  Psychiatric: She has a normal mood and affect. Her behavior is normal. Judgment and thought content normal.    Ortho Exam  Exam today reveals no reactivity. No warmth or erythema. She has trace effusion. She lacks a few degrees of extension that about 5-7. Flexes to 95.  Specialty Comments:  No specialty comments available.  Imaging: No results found.   PMFS History: Patient Active Problem List   Diagnosis Date Noted  . Bilateral primary osteoarthritis of knee 07/22/2017  . Hyperlipidemia 05/20/2014  . Hypotension 08/02/2013  . UTI (lower urinary tract infection) 08/02/2013  . Acute renal failure ([place]) 08/02/2013  . ARF (acute renal failure) ([place]) 08/02/2013  . Calculus of gallbladder with acute cholecystitis, without mention of obstruction 07/10/2013  . Acute cholecystitis 06/17/2013  . Chest pain 06/17/2013  . Pulmonary infiltrate in left lung on chest x-ray 06/17/2013  .  Hypertension   . CAD (coronary artery disease)    Past Medical History:  Diagnosis Date  . Arthritis   . CAD (coronary artery disease)    a. Mod prox LAD & first diagonal stenosis by cath 2002 - managed medically since that time with normal LV function.  . Cataract   . Hyperlipidemia   . Hypertension     Family  History  Problem Relation Age of Onset  . Heart disease Mother        "enlarged valve"  . Cancer Mother        Ovarian  . Stroke Father   . Cancer Sister        Colon    Past Surgical History:  Procedure Laterality Date  . ABDOMINAL HYSTERECTOMY    . APPENDECTOMY    . BACK SURGERY    . CARPAL TUNNEL RELEASE    . CHOLECYSTECTOMY N/A 06/19/2013   Procedure: LAPAROSCOPIC CHOLECYSTECTOMY;  Surgeon: Harl Bowie, MD;  Location: [facility];  Service: General;  Laterality: N/A;  . ESOPHAGOGASTRODUODENOSCOPY N/A 04/26/2017   Procedure: ESOPHAGOGASTRODUODENOSCOPY (EGD) W/ DILATION;  Surgeon: Ronnette Juniper, MD;  Location: [facility];  Service: Gastroenterology;  Laterality: N/A;  . HERNIA REPAIR    . IR GENERIC HISTORICAL  03/18/2016   IR RADIOLOGIST EVAL & MGMT 03/18/2016 Corrie Mckusick, DO [facility] INTERV RAD  . KNEE SURGERY    . SHOULDER SURGERY    . SPLENECTOMY, TOTAL     Social History   Occupational History  . Not on file  Tobacco Use  . Smoking status: Never Smoker  . Smokeless tobacco: Never Used  Substance and Sexual Activity  . Alcohol use: No  . Drug use: No  . Sexual activity: Not on file

## 2017-08-05 ENCOUNTER — Telehealth (INDEPENDENT_AMBULATORY_CARE_PROVIDER_SITE_OTHER): Payer: Self-pay | Admitting: Orthopaedic Surgery

## 2017-08-05 NOTE — Telephone Encounter (Signed)
RX for Voltaren 1% gel. Pharmacy needs clarification on directions, and quantity due to directions. Also, question allergy profile with Diclofenac rx. Please call to advise. Allen.

## 2017-08-15 ENCOUNTER — Telehealth (INDEPENDENT_AMBULATORY_CARE_PROVIDER_SITE_OTHER): Payer: Self-pay | Admitting: Orthopaedic Surgery

## 2017-08-15 NOTE — Telephone Encounter (Signed)
Patient would like a rx for a lift chair. Patient wants to use [facility] supply Fax# [phone]. Patient would like a call if approved.

## 2017-08-17 NOTE — Telephone Encounter (Signed)
Patient called again in reference to getting an RX for a lift chair. Per patient chair is on sale today, and needs rx if doctor feels this is suitable for her. Please call patient to advise.

## 2017-08-18 NOTE — Telephone Encounter (Signed)
LVMOM

## 2017-09-06 ENCOUNTER — Telehealth: Payer: Self-pay | Admitting: Oncology

## 2017-09-06 ENCOUNTER — Inpatient Hospital Stay: Payer: Medicare Other

## 2017-09-06 ENCOUNTER — Inpatient Hospital Stay: Payer: Medicare Other | Attending: Oncology | Admitting: Oncology

## 2017-09-06 VITALS — BP 174/64 | HR 64 | Temp 97.5°F | Resp 17 | Ht 63.0 in | Wt 160.7 lb

## 2017-09-06 DIAGNOSIS — D696 Thrombocytopenia, unspecified: Secondary | ICD-10-CM

## 2017-09-06 DIAGNOSIS — R1900 Intra-abdominal and pelvic swelling, mass and lump, unspecified site: Secondary | ICD-10-CM

## 2017-09-06 DIAGNOSIS — D471 Chronic myeloproliferative disease: Secondary | ICD-10-CM | POA: Diagnosis not present

## 2017-09-06 LAB — CBC WITH DIFFERENTIAL/PLATELET
Basophils Absolute: 0.1 10*3/uL (ref 0.0–0.1)
Basophils Relative: 1 %
Eosinophils Absolute: 0.2 10*3/uL (ref 0.0–0.5)
Eosinophils Relative: 3 %
HCT: 46.8 % — ABNORMAL HIGH (ref 34.8–46.6)
Hemoglobin: 15.1 g/dL (ref 11.6–15.9)
Lymphocytes Relative: 24 %
Lymphs Abs: 1.6 10*3/uL (ref 0.9–3.3)
MCH: 29.6 pg (ref 25.1–34.0)
MCHC: 32.3 g/dL (ref 31.5–36.0)
MCV: 91.7 fL (ref 79.5–101.0)
Monocytes Absolute: 1.2 10*3/uL — ABNORMAL HIGH (ref 0.1–0.9)
Monocytes Relative: 17 %
Neutro Abs: 3.7 10*3/uL (ref 1.5–6.5)
Neutrophils Relative %: 55 %
Platelets: 225 10*3/uL (ref 145–400)
RBC: 5.1 MIL/uL (ref 3.70–5.45)
RDW: 16.1 % (ref 11.2–16.1)
WBC: 6.8 10*3/uL (ref 3.9–10.3)

## 2017-09-06 NOTE — Progress Notes (Signed)
Hematology and Oncology Follow Up Visit  [name] [id number] [birth year] 84 y.o. 09/06/2017 10:17 AM Stoneking, Christiane Ha, MDStoneking, Christiane Ha, MD   Principle Diagnosis: 84 year old with:  1.  Thrombocytopenia diagnosed in the 90s.  She underwent a splenectomy at the time.   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  She has no evidence to suggest a myeloproliferative disorder.  The differential diagnosis includes myelofibrosis versus secondary causes related to previous splenectomy.  Current therapy: Observation and surveillance.  Interim History: [name] is here by herself for a follow-up.  She reports no major changes since the last visit.  She continues to have periodic discomfort in her abdomen and back in the morning that improves as the day progresses.  She continues to live independently in a senior living facility but able to ambulate with the help of a walker without any falls or syncope.  She is able to eat at this time and maintain her weight.  She did have nausea issues that has reported at this time.  She denies any vomiting, abdominal pain or change in her bowel habits.  Her performance status remained unchanged at this time.  She does not report any headaches, blurry vision, syncope or seizures. She does not report any fevers, chills or sweats. She does not report any cough, wheezing or hemoptysis. She does not report any chest pain, palpitation, orthopnea or leg edema. She does not report any nausea, vomiting hematochezia or melena. She does not report any frequency urgency or hesitancy.  He does not report any easy bruising, lymphadenopathy or skin rashes.  Remaining review of systems is negative.  Medications: I have reviewed the patient's current medications.  Current Outpatient Medications  Medication Sig Dispense Refill  . acetaminophen (TYLENOL 8 HOUR ARTHRITIS PAIN) 650 MG CR tablet Take 650 mg by mouth every 8 (eight) hours  as needed for pain.    Marland Kitchen ALPRAZolam (XANAX) 0.5 MG tablet Take 0.25-0.5 mg by mouth at bedtime as needed for sleep. Reported on 09/07/2015    . aspirin EC 325 MG tablet Take 81 mg by mouth daily.     Marland Kitchen aspirin EC 81 MG tablet Take 81 mg by mouth daily.    Marland Kitchen BIOTIN PO Take 1,500 mcg by mouth daily after lunch.     . diclofenac sodium (VOLTAREN) 1 % GEL Apply 2 g topically 4 (four) times daily. 1 Tube 3  . fish oil-omega-3 fatty acids 1000 MG capsule Take 1 g by mouth daily after lunch.     . hydrochlorothiazide (HYDRODIURIL) 25 MG tablet Take 25 mg by mouth daily.    Marland Kitchen ketoconazole (NIZORAL) 2 % shampoo ketoconazole 2 % shampoo    . Multiple Vitamin (MULTIVITAMIN WITH MINERALS) TABS Take 1 tablet by mouth daily after lunch.    . Multiple Vitamins-Minerals (PRESERVISION AREDS 2 PO) Take 1 tablet by mouth daily.    . pantoprazole (PROTONIX) 40 MG tablet pantoprazole 40 mg tablet,delayed release    . Polyethyl Glycol-Propyl Glycol (SYSTANE OP) Place 1 drop into both eyes 2 (two) times daily.     . polyethylene glycol powder (GLYCOLAX/MIRALAX) powder Take 17 g by mouth daily as needed (for constipation).    . ranitidine (ZANTAC) 150 MG tablet Take 150 mg by mouth daily as needed for heartburn. Reported on 09/07/2015     No current facility-administered medications for this visit.      Allergies:  Allergies  Allergen Reactions  . Prednisone   .  Shrimp [Shellfish Allergy] Nausea And Vomiting  . Statins Other (See Comments)    Muscle/leg pain.  . Felodipine Other (See Comments)    Reaction: unknown possible nausea or rash  . Omeprazole Other (See Comments)    Reaction: unknown possible nausea or rash    Past Medical History, Surgical history, Social history, and Family History were reviewed and updated.   Physical Exam: Blood pressure (!) 174/64, pulse 64, temperature (!) 97.5 F (36.4 C), temperature source Oral, resp. rate 17, height 5' 3"  (1.6 m), weight 160 lb 11.2 oz (72.9 kg), SpO2  98 %. ECOG: 0 General appearance: Alert, awake woman without distress. Head: Normocephalic, without obvious abnormality  Oral mucosa without thrush or ulcers. Eyes: No scleral icterus. Lymph nodes: Cervical, supraclavicular, and axillary nodes normal. Heart:regular rate and rhythm, S1, S2 normal, no murmur, click, rub or gallop Lung:chest clear, no wheezing, rales, normal symmetric air entry. Abdomin: Soft without any tenderness or rebound.  Good bowel sounds auscultated.  No masses or lesions. Musculoskeletal: No joint deformity or effusion. Skin: No ulcers or lesions.  Lab Results: Lab Results  Component Value Date   WBC 6.8 09/06/2017   HGB 15.1 09/06/2017   HCT 46.8 (H) 09/06/2017   MCV 91.7 09/06/2017   PLT 225 09/06/2017     Chemistry      Component Value Date/Time   NA 139 09/07/2016 0958   K 3.9 09/07/2016 0958   CL 105 08/03/2013 0550   CO2 24 09/07/2016 0958   BUN 27.2 (H) 09/07/2016 0958   CREATININE 1.0 09/07/2016 0958      Component Value Date/Time   CALCIUM 10.4 09/07/2016 0958   ALKPHOS 82 09/07/2016 0958   AST 25 09/07/2016 0958   ALT 15 09/07/2016 0958   BILITOT 0.35 09/07/2016 0958       Impression and Plan:  84 year old woman with the following issues:  1. 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  CT scan obtained at the time showed a mass measuring 3.7 x 1.6 x 3.4 cm.    The differential diagnosis was reviewed today with the patient.  She does have history of splenectomy related to immune thrombocytopenia per her report.  This could be related to early stage myelofibrosis versus secondary as a result of her splenectomy  Her hemoglobin continues to be normal as well as a normal CBC.  From a management standpoint, I offered her a bone marrow biopsy to confirm or rule out any myeloproliferative disorder.  Given the fact that she is completely asymptomatic at this time from this finding I recommended continued  observation and surveillance.  We will repeat a CBC in 6-8 months and consider bone marrow biopsy at that time if she develops any symptoms.  2.  Thrombocytopenia: Per her report that was diagnosed in the 90s and underwent a splenectomy.  Platelet count is normal at this time.  3. Mild nausea without weight loss: Resolved at this time and appears to be eating well and maintaining her weight.  4. Follow-up: Will be in 8 months.  15  minutes was spent with the patient face-to-face today.  More than 50% of time was dedicated to patient counseling, education and answering her questions.     Zola Button, MD 1/22/201910:17 AM

## 2017-09-06 NOTE — Addendum Note (Signed)
Addended by: Amelia Jo I on: 09/06/2017 10:38 AM   Modules accepted: Orders

## 2017-09-06 NOTE — Telephone Encounter (Signed)
Gave avs and calendar for September  °

## 2017-09-15 ENCOUNTER — Other Ambulatory Visit: Payer: Self-pay | Admitting: Geriatric Medicine

## 2017-09-15 DIAGNOSIS — N183 Chronic kidney disease, stage 3 (moderate): Secondary | ICD-10-CM | POA: Diagnosis not present

## 2017-09-15 DIAGNOSIS — R1031 Right lower quadrant pain: Secondary | ICD-10-CM

## 2017-09-15 DIAGNOSIS — M545 Low back pain: Secondary | ICD-10-CM | POA: Diagnosis not present

## 2017-09-15 DIAGNOSIS — I129 Hypertensive chronic kidney disease with stage 1 through stage 4 chronic kidney disease, or unspecified chronic kidney disease: Secondary | ICD-10-CM | POA: Diagnosis not present

## 2017-09-16 LAB — JAK2 (INCLUDING V617F AND EXON 12), MPL,& CALR-NEXT GEN SEQ

## 2017-09-22 ENCOUNTER — Other Ambulatory Visit: Payer: Medicare Other

## 2017-10-04 DIAGNOSIS — L821 Other seborrheic keratosis: Secondary | ICD-10-CM | POA: Diagnosis not present

## 2017-10-04 DIAGNOSIS — L814 Other melanin hyperpigmentation: Secondary | ICD-10-CM | POA: Diagnosis not present

## 2017-10-18 ENCOUNTER — Other Ambulatory Visit: Payer: Medicare Other

## 2017-10-18 ENCOUNTER — Ambulatory Visit
Admission: RE | Admit: 2017-10-18 | Discharge: 2017-10-18 | Disposition: A | Discharge status: Home / Self Care | Payer: Medicare Other | Source: Ambulatory Visit | Attending: Geriatric Medicine | Admitting: Geriatric Medicine

## 2017-10-18 DIAGNOSIS — R1031 Right lower quadrant pain: Secondary | ICD-10-CM

## 2017-10-18 MED ORDER — IOPAMIDOL (ISOVUE-300) INJECTION 61%
100.0000 mL | Freq: Once | INTRAVENOUS | Status: AC | PRN
  Administered 2017-10-18: 100 mL via INTRAVENOUS

## 2017-10-31 ENCOUNTER — Ambulatory Visit (INDEPENDENT_AMBULATORY_CARE_PROVIDER_SITE_OTHER): Payer: Medicare Other

## 2017-10-31 ENCOUNTER — Ambulatory Visit (INDEPENDENT_AMBULATORY_CARE_PROVIDER_SITE_OTHER): Payer: Medicare Other | Admitting: Orthopaedic Surgery

## 2017-10-31 ENCOUNTER — Encounter (INDEPENDENT_AMBULATORY_CARE_PROVIDER_SITE_OTHER): Payer: Self-pay | Admitting: Orthopaedic Surgery

## 2017-10-31 ENCOUNTER — Ambulatory Visit (INDEPENDENT_AMBULATORY_CARE_PROVIDER_SITE_OTHER): Payer: Self-pay

## 2017-10-31 VITALS — BP 178/84 | HR 61 | Resp 16 | Ht 63.0 in | Wt 156.0 lb

## 2017-10-31 DIAGNOSIS — M25561 Pain in right knee: Secondary | ICD-10-CM

## 2017-10-31 DIAGNOSIS — M25562 Pain in left knee: Secondary | ICD-10-CM

## 2017-10-31 MED ORDER — METHYLPREDNISOLONE ACETATE 40 MG/ML IJ SUSP
80.0000 mg | INTRAMUSCULAR | Status: AC | PRN
  Administered 2017-10-31: 80 mg

## 2017-10-31 MED ORDER — BUPIVACAINE HCL 0.5 % IJ SOLN
2.0000 mL | INTRAMUSCULAR | Status: AC | PRN
  Administered 2017-10-31: 2 mL via INTRA_ARTICULAR

## 2017-10-31 MED ORDER — LIDOCAINE HCL 1 % IJ SOLN
2.0000 mL | INTRAMUSCULAR | Status: AC | PRN
  Administered 2017-10-31: 2 mL

## 2017-10-31 NOTE — Progress Notes (Signed)
Office Visit Note   Patient: [name]           Date of Birth: [birth year]           MRN: [id number] Visit Date: 10/31/2017              Requested by: Lajean Manes, MD [address] [place], [place] [address] PCP: Lajean Manes, MD   Assessment & Plan: Visit Diagnoses:  1. Acute pain of left knee   2. Acute pain of right knee     Plan: Long discussion regarding x-ray findings and diagnosis of bilateral knee osteoarthritis predominant lateral compartments.  Will inject the more symptomatic left knee with cortisone and reevaluate in 2 or 3 weeks consideration of right knee injection  Follow-Up Instructions: Return if symptoms worsen or fail to improve.   Orders:  Orders Placed This Encounter  Procedures  . Large Joint Inj: L knee  . XR KNEE 3 VIEW LEFT  . XR KNEE 3 VIEW RIGHT   No orders of the defined types were placed in this encounter.     Procedures: Large Joint Inj: L knee on 10/31/2017 12:40 PM Indications: pain and diagnostic evaluation Details: 25 G 1.5 in needle, anteromedial approach  Arthrogram: No  Medications: 2 mL lidocaine 1 %; 2 mL bupivacaine 0.5 %; 80 mg methylPREDNISolone acetate 40 MG/ML Procedure, treatment alternatives, risks and benefits explained, specific risks discussed. Consent was given by the patient. Patient was prepped and draped in the usual sterile fashion.       Clinical Data: No additional findings.   Subjective: Chief Complaint  Patient presents with  . Left Knee - Pain    [name] IS 84 Y O F HERE FOR LEFT KNEE PAIN FOR 1 MONTH, NO INJURY, XRAYS OR INJECTIONS   . Right Knee - Pain    [name] IS 84 Y O F HERE FOR RIGHT KNEE PAIN FOR 1 MONTH, NO INJURY, XRAYS OR INJECTIONS   Prior diagnosis of osteoarthritis of both knees.  Prior cortisone injection with good relief.  Presently having more trouble on the left than the right.  Having difficulty bearing weight.  Uses a walker.  No injury or trauma.  No  numbness or tingling or back pain.  No hip pain  HPI  Review of Systems  Constitutional: Positive for fatigue. Negative for fever.  HENT: Positive for ear pain.   Eyes: Negative for pain.  Respiratory: Negative for cough and shortness of breath.   Cardiovascular: Positive for leg swelling.  Gastrointestinal: Positive for constipation. Negative for blood in stool and diarrhea.  Genitourinary: Negative for dysuria.  Musculoskeletal: Positive for back pain.  Skin: Negative for rash and wound.  Allergic/Immunologic: Positive for food allergies.  Neurological: Positive for weakness. Negative for dizziness, light-headedness, numbness and headaches.  Hematological: Bruises/bleeds easily.  Psychiatric/Behavioral: Positive for sleep disturbance.     Objective: Vital Signs: BP (!) 178/84 (BP Location: Left Arm, Patient Position: Sitting, Cuff Size: Normal)   Pulse 61   Resp 16   Ht 5\' 3"  (1.6 m)   Wt 156 lb (70.8 kg)   BMI 27.63 kg/m   Physical Exam  Ortho Exam awake alert and oriented x3.  Comfortable sitting.  Pain in both knees predominantly in the lateral compartments.  Full extension about 105 degrees of flexion.  No instability.  Straight leg raise negative.  Painless range of motion both hips.  Pain with weightbearing in the lateral compartment of both  of her knees.  No swelling distally neurovascular exam intact.  Some fullness in the popliteal space medially consistent with small Baker's cyst.  No calf pain  Specialty Comments:  No specialty comments available.  Imaging: Xr Knee 3 View Left  Result Date: 10/31/2017 Films of the left knee were obtained in 3 projections standing.  There are degenerative changes lateral compartment more pronounced than any other compartment.  There are tricompartmental degenerative changes evidence of fracture.  Slight increased valgus.  Lateral compartment with narrowing of the joint space peripheral osteophytes and subchondral sclerosis.  No  ectopic calcification  Xr Knee 3 View Right  Result Date: 10/31/2017 Films of the right knee were obtained in 3 projections standing.  There are tricompartmental degenerative changes predominantly in the lateral compartment.  Lateral compartment has peripheral osteophytes, narrowing of the joint space, subchondral sclerosis and slight increased valgus no ectopic calcification.  No fracture    PMFS History: Patient Active Problem List   Diagnosis Date Noted  . Bilateral primary osteoarthritis of knee 07/22/2017  . Hyperlipidemia 05/20/2014  . Hypotension 08/02/2013  . UTI (lower urinary tract infection) 08/02/2013  . Acute renal failure ([facility]) 08/02/2013  . ARF (acute renal failure) ([facility]) 08/02/2013  . Calculus of gallbladder with acute cholecystitis, without mention of obstruction 07/10/2013  . Acute cholecystitis 06/17/2013  . Chest pain 06/17/2013  . Pulmonary infiltrate in left lung on chest x-ray 06/17/2013  . Hypertension   . CAD (coronary artery disease)    Past Medical History:  Diagnosis Date  . Arthritis   . CAD (coronary artery disease)    a. Mod prox LAD & first diagonal stenosis by cath 2002 - managed medically since that time with normal LV function.  . Cataract   . Hyperlipidemia   . Hypertension     Family History  Problem Relation Age of Onset  . Heart disease Mother        "enlarged valve"  . Cancer Mother        Ovarian  . Stroke Father   . Cancer Sister        Colon    Past Surgical History:  Procedure Laterality Date  . ABDOMINAL HYSTERECTOMY    . APPENDECTOMY    . BACK SURGERY    . CARPAL TUNNEL RELEASE    . CHOLECYSTECTOMY N/A 06/19/2013   Procedure: LAPAROSCOPIC CHOLECYSTECTOMY;  Surgeon: Harl Bowie, MD;  Location: [facility];  Service: General;  Laterality: N/A;  . ESOPHAGOGASTRODUODENOSCOPY N/A 04/26/2017   Procedure: ESOPHAGOGASTRODUODENOSCOPY (EGD) W/ DILATION;  Surgeon: Ronnette Juniper, MD;  Location: [facility];  Service:  Gastroenterology;  Laterality: N/A;  . HERNIA REPAIR    . IR GENERIC HISTORICAL  03/18/2016   IR RADIOLOGIST EVAL & MGMT 03/18/2016 Corrie Mckusick, DO [facility] INTERV RAD  . KNEE SURGERY    . SHOULDER SURGERY    . SPLENECTOMY, TOTAL     Social History   Occupational History  . Not on file  Tobacco Use  . Smoking status: Never Smoker  . Smokeless tobacco: Never Used  Substance and Sexual Activity  . Alcohol use: No  . Drug use: No  . Sexual activity: Not on file

## 2017-11-14 ENCOUNTER — Ambulatory Visit (INDEPENDENT_AMBULATORY_CARE_PROVIDER_SITE_OTHER): Payer: Medicare Other | Admitting: Orthopaedic Surgery

## 2017-12-22 DIAGNOSIS — Z1389 Encounter for screening for other disorder: Secondary | ICD-10-CM | POA: Diagnosis not present

## 2017-12-22 DIAGNOSIS — K5901 Slow transit constipation: Secondary | ICD-10-CM | POA: Diagnosis not present

## 2017-12-22 DIAGNOSIS — N183 Chronic kidney disease, stage 3 (moderate): Secondary | ICD-10-CM | POA: Diagnosis not present

## 2017-12-22 DIAGNOSIS — I129 Hypertensive chronic kidney disease with stage 1 through stage 4 chronic kidney disease, or unspecified chronic kidney disease: Secondary | ICD-10-CM | POA: Diagnosis not present

## 2017-12-22 DIAGNOSIS — Z0000 Encounter for general adult medical examination without abnormal findings: Secondary | ICD-10-CM | POA: Diagnosis not present

## 2018-02-15 ENCOUNTER — Emergency Department (HOSPITAL_BASED_OUTPATIENT_CLINIC_OR_DEPARTMENT_OTHER): Payer: Medicare Other

## 2018-02-15 ENCOUNTER — Other Ambulatory Visit: Payer: Self-pay

## 2018-02-15 ENCOUNTER — Emergency Department (HOSPITAL_COMMUNITY)
Admission: EM | Admit: 2018-02-15 | Discharge: 2018-02-15 | Disposition: A | Discharge status: Home / Self Care | Payer: Medicare Other | Attending: Emergency Medicine | Admitting: Emergency Medicine

## 2018-02-15 ENCOUNTER — Emergency Department (HOSPITAL_COMMUNITY): Payer: Medicare Other

## 2018-02-15 DIAGNOSIS — M25562 Pain in left knee: Secondary | ICD-10-CM | POA: Insufficient documentation

## 2018-02-15 DIAGNOSIS — Z79899 Other long term (current) drug therapy: Secondary | ICD-10-CM | POA: Insufficient documentation

## 2018-02-15 DIAGNOSIS — M25561 Pain in right knee: Secondary | ICD-10-CM

## 2018-02-15 DIAGNOSIS — I2510 Atherosclerotic heart disease of native coronary artery without angina pectoris: Secondary | ICD-10-CM | POA: Diagnosis not present

## 2018-02-15 DIAGNOSIS — M7989 Other specified soft tissue disorders: Secondary | ICD-10-CM | POA: Diagnosis not present

## 2018-02-15 DIAGNOSIS — I10 Essential (primary) hypertension: Secondary | ICD-10-CM | POA: Insufficient documentation

## 2018-02-15 DIAGNOSIS — Z7982 Long term (current) use of aspirin: Secondary | ICD-10-CM | POA: Diagnosis not present

## 2018-02-15 DIAGNOSIS — R2242 Localized swelling, mass and lump, left lower limb: Secondary | ICD-10-CM | POA: Insufficient documentation

## 2018-02-15 DIAGNOSIS — E785 Hyperlipidemia, unspecified: Secondary | ICD-10-CM | POA: Diagnosis not present

## 2018-02-15 DIAGNOSIS — S8992XA Unspecified injury of left lower leg, initial encounter: Secondary | ICD-10-CM | POA: Diagnosis not present

## 2018-02-15 MED ORDER — TRAMADOL HCL 50 MG PO TABS: 50.0000 mg | ORAL_TABLET | Freq: Four times a day (QID) | ORAL | 0 refills | Status: DC | PRN

## 2018-02-15 NOTE — Progress Notes (Signed)
Left lower extremity venous duplex has been completed. Negative for DVT. Results were given to Dr. Tamera Punt.  02/15/18 2:19 PM [name] RVT

## 2018-02-15 NOTE — ED Notes (Signed)
US at bedside

## 2018-02-15 NOTE — ED Triage Notes (Signed)
Patient reports fall 2 weeks ago, patient reports increasing left knee pain, mobility progressively getting worse. No obviously swelling noted to knee. PMS intact. Hx arthritis. Not currently taking anything for pain.

## 2018-02-15 NOTE — ED Notes (Signed)
ED Provider at bedside. 

## 2018-02-15 NOTE — ED Provider Notes (Signed)
[facility] DEPT Provider Note   CSN: [id number] Arrival date & time: 02/15/18  1058     History   Chief Complaint Chief Complaint  Patient presents with  . Knee Pain    HPI [name] is a 84 y.o. female.  Patient is a 84 year old female who presents with knee pain.  She states she has some chronic issues with both of her knees due to arthritis but she had a fall about 2 weeks ago where she was unloading groceries and fell on the floor.  She is not sure exactly what made her fall at that time but since then her knees have been hurting worse.  She did not fall directly on them but feels like they may have twisted.  She states primarily her left knee is hurting and it hurts to walk on it.  It does radiate down her left leg.  She denies any swelling of the leg.  No numbness or weakness in the leg.  She states now her right knee is started to hurt as well.  She denies any chest pain or shortness of breath.  No fevers.  She is been using Tylenol arthritis without improvement in symptoms.     Past Medical History:  Diagnosis Date  . Arthritis   . CAD (coronary artery disease)    a. Mod prox LAD & first diagonal stenosis by cath 2002 - managed medically since that time with normal LV function.  . Cataract   . Hyperlipidemia   . Hypertension     Patient Active Problem List   Diagnosis Date Noted  . Bilateral primary osteoarthritis of knee 07/22/2017  . Hyperlipidemia 05/20/2014  . Hypotension 08/02/2013  . UTI (lower urinary tract infection) 08/02/2013  . Acute renal failure ([place]) 08/02/2013  . ARF (acute renal failure) ([place]) 08/02/2013  . Calculus of gallbladder with acute cholecystitis, without mention of obstruction 07/10/2013  . Acute cholecystitis 06/17/2013  . Chest pain 06/17/2013  . Pulmonary infiltrate in left lung on chest x-ray 06/17/2013  . Hypertension   . CAD (coronary artery disease)     Past Surgical History:  Procedure  Laterality Date  . ABDOMINAL HYSTERECTOMY    . APPENDECTOMY    . BACK SURGERY    . CARPAL TUNNEL RELEASE    . CHOLECYSTECTOMY N/A 06/19/2013   Procedure: LAPAROSCOPIC CHOLECYSTECTOMY;  Surgeon: Harl Bowie, MD;  Location: [facility];  Service: General;  Laterality: N/A;  . ESOPHAGOGASTRODUODENOSCOPY N/A 04/26/2017   Procedure: ESOPHAGOGASTRODUODENOSCOPY (EGD) W/ DILATION;  Surgeon: Ronnette Juniper, MD;  Location: [facility];  Service: Gastroenterology;  Laterality: N/A;  . HERNIA REPAIR    . IR GENERIC HISTORICAL  03/18/2016   IR RADIOLOGIST EVAL & MGMT 03/18/2016 Corrie Mckusick, DO [facility] INTERV RAD  . KNEE SURGERY    . SHOULDER SURGERY    . SPLENECTOMY, TOTAL       OB History   None      Home Medications    Prior to Admission medications   Medication Sig Start Date End Date Taking? Authorizing Provider  acetaminophen (TYLENOL 8 HOUR ARTHRITIS PAIN) 650 MG CR tablet Take 650 mg by mouth every 8 (eight) hours as needed for pain.   Yes [provider]  ALPRAZolam (XANAX) 0.5 MG tablet Take 0.25-0.5 mg by mouth at bedtime as needed for sleep. Reported on 09/07/2015   Yes [provider]  aspirin EC 81 MG tablet Take 81 mg by mouth daily.   Yes [provider]  diclofenac sodium (VOLTAREN) 1 % GEL Apply 2 g topically 4 (four) times daily. 07/28/17  Yes Garald Balding, MD  hydrochlorothiazide (HYDRODIURIL) 25 MG tablet Take 25 mg by mouth daily.   Yes [provider]  Multiple Vitamins-Minerals (PRESERVISION AREDS 2 PO) Take 1 tablet by mouth daily.   Yes [provider]  Ondansetron HCl (ZOFRAN PO) Take 1 tablet by mouth daily as needed (nausea).   Yes [provider]  Polyethyl Glycol-Propyl Glycol (SYSTANE OP) Place 1 drop into both eyes 2 (two) times daily.    Yes [provider]  polyethylene glycol powder (GLYCOLAX/MIRALAX) powder Take 17 g by mouth daily as needed (for constipation).   Yes [provider]    traMADol (ULTRAM) 50 MG tablet Take 1 tablet (50 mg total) by mouth every 6 (six) hours as needed. 02/15/18   Malvin Johns, MD    Family History Family History  Problem Relation Age of Onset  . Heart disease Mother        "enlarged valve"  . Cancer Mother        Ovarian  . Stroke Father   . Cancer Sister        Colon    Social History Social History   Tobacco Use  . Smoking status: Never Smoker  . Smokeless tobacco: Never Used  Substance Use Topics  . Alcohol use: No  . Drug use: No     Allergies   Felodipine; Prednisone; Shrimp [shellfish allergy]; Statins; and Omeprazole   Review of Systems Review of Systems  Constitutional: Negative for fever.  Respiratory: Negative for shortness of breath.   Cardiovascular: Negative for chest pain.  Gastrointestinal: Negative for nausea and vomiting.  Musculoskeletal: Positive for arthralgias and joint swelling. Negative for back pain and neck pain.  Skin: Negative for wound.  Neurological: Negative for weakness, numbness and headaches.  All other systems reviewed and are negative.    Physical Exam Updated Vital Signs BP (!) 181/84   Pulse 65   Temp 98.4 F (36.9 C) (Oral)   Resp 19   Ht 5\' 3"  (1.6 m)   Wt 70.8 kg (156 lb)   SpO2 94%   BMI 27.63 kg/m   Physical Exam  Constitutional: She is oriented to person, place, and time. She appears well-developed and well-nourished.  HENT:  Head: Normocephalic and atraumatic.  Eyes: Pupils are equal, round, and reactive to light.  Neck: Normal range of motion. Neck supple.  Cardiovascular: Normal rate, regular rhythm and normal heart sounds.  Pulmonary/Chest: Effort normal and breath sounds normal. No respiratory distress. She has no wheezes. She has no rales. She exhibits no tenderness.  Abdominal: Soft. Bowel sounds are normal. There is no tenderness. There is no rebound and no guarding.  Musculoskeletal: Normal range of motion. She exhibits edema and tenderness.   Patient has slight edema over the left knee.  There is no noticeable effusion.  No warmth or erythema.  She has some pain on palpation of the anterior aspect to the knee but no significant pain on range of motion.  There is some pain in the posterior aspect of the knee as well as the posterior aspect of the left upper calf.  There is some generalized mild pain on palpation of the right knee but no swelling or joint effusion is noted.  No pain on range of motion of the knee.  Pedal pulses are intact.  She has normal sensation and motor function distally.  Lymphadenopathy:    She has no cervical adenopathy.  Neurological: She is alert and oriented to person, place, and time.  Skin: Skin is warm and dry. No rash noted.  Psychiatric: She has a normal mood and affect.     ED Treatments / Results  Labs (all labs ordered are listed, but only abnormal results are displayed) Labs Reviewed - No data to display  EKG None  Radiology Dg Knee Complete 4 Views Left  Result Date: 02/15/2018 CLINICAL DATA:  Left knee pain after fall 2 weeks ago. EXAM: LEFT KNEE - COMPLETE 4+ VIEW COMPARISON:  None. FINDINGS: No evidence of fracture, dislocation, or joint effusion. No significant joint space narrowing is noted. Osteophyte formation is noted laterally. Soft tissues are unremarkable. IMPRESSION: Mild degenerative joint disease is noted medially. No acute abnormality seen in the left knee. Electronically Signed   By: Marijo Conception, M.D.   On: 02/15/2018 12:28   Dg Knee Complete 4 Views Right  Result Date: 02/15/2018 CLINICAL DATA:  Right knee pain after fall 2 weeks ago. EXAM: RIGHT KNEE - COMPLETE 4+ VIEW COMPARISON:  MRI of November 09, 2006. FINDINGS: No evidence of fracture, dislocation, or joint effusion. Mild narrowing of patellofemoral space is noted with osteophyte formation. Mild osteophyte formation is noted laterally. Soft tissues are unremarkable. IMPRESSION: Mild degenerative joint disease is noted.  No acute abnormality seen in the right knee. Electronically Signed   By: Marijo Conception, M.D.   On: 02/15/2018 12:30    Procedures Procedures (including critical care time)  Medications Ordered in ED Medications - No data to display   Initial Impression / Assessment and Plan / ED Course  I have reviewed the triage vital signs and the nursing notes.  Pertinent labs & imaging results that were available during my care of the patient were reviewed by me and considered in my medical decision making (see chart for details).     Patient is a 84 year old female who presents with pain in both of her knees.  The left is worse than the right.  I do not see any obvious effusion.  No suggestions of infection.  No bony injuries are identified.  I did do an ultrasound to make sure there was not a blood clot and there is no evidence of DVT.  This is likely arthritic changes.  She was advised in ice and elevation.  She has a walker to use at home.  She was given a prescription for tramadol for pain.  She has an appointment to follow-up with her orthopedist, Dr. Durward Fortes.  Final Clinical Impressions(s) / ED Diagnoses   Final diagnoses:  Acute pain of both knees    ED Discharge Orders        Ordered    traMADol (ULTRAM) 50 MG tablet  Every 6 hours PRN     02/15/18 1421       Malvin Johns, MD 02/15/18 1423

## 2018-02-20 ENCOUNTER — Encounter (INDEPENDENT_AMBULATORY_CARE_PROVIDER_SITE_OTHER): Payer: Self-pay | Admitting: Orthopaedic Surgery

## 2018-02-20 ENCOUNTER — Ambulatory Visit (INDEPENDENT_AMBULATORY_CARE_PROVIDER_SITE_OTHER): Payer: Medicare Other | Admitting: Orthopaedic Surgery

## 2018-02-20 VITALS — BP 162/77 | Resp 18 | Ht 63.0 in | Wt 156.0 lb

## 2018-02-20 DIAGNOSIS — M25562 Pain in left knee: Secondary | ICD-10-CM

## 2018-02-20 MED ORDER — LIDOCAINE HCL 1 % IJ SOLN
2.0000 mL | INTRAMUSCULAR | Status: AC | PRN
  Administered 2018-02-20: 2 mL

## 2018-02-20 MED ORDER — BUPIVACAINE HCL 0.5 % IJ SOLN
2.0000 mL | INTRAMUSCULAR | Status: AC | PRN
  Administered 2018-02-20: 2 mL via INTRA_ARTICULAR

## 2018-02-20 MED ORDER — METHYLPREDNISOLONE ACETATE 40 MG/ML IJ SUSP
80.0000 mg | INTRAMUSCULAR | Status: AC | PRN
  Administered 2018-02-20: 80 mg

## 2018-02-20 NOTE — Progress Notes (Signed)
Office Visit Note   Patient: [name]           Date of Birth: [birth year]           MRN: [id number] Visit Date: 02/20/2018              Requested by: Lajean Manes, MD [address] [place], [place] [address] PCP: Lajean Manes, MD   Assessment & Plan: Visit Diagnoses:  1. Acute pain of left knee     Plan: No obvious fracture by the films performed last week of left knee.  [place] in June with recurrent left knee pain.  Last seen in March with evidence of osteoarthritis by film left knee.  Injected with good result.  Will reinject left knee today and monitor response  Follow-Up Instructions: Return if symptoms worsen or fail to improve.   Orders:  Orders Placed This Encounter  Procedures  . Large Joint Inj: L knee   No orders of the defined types were placed in this encounter.     Procedures: Large Joint Inj: L knee on 02/20/2018 12:28 PM Indications: pain and diagnostic evaluation Details: 25 G 1.5 in needle, anteromedial approach  Arthrogram: No  Medications: 2 mL lidocaine 1 %; 2 mL bupivacaine 0.5 %; 80 mg methylPREDNISolone acetate 40 MG/ML Procedure, treatment alternatives, risks and benefits explained, specific risks discussed. Consent was given by the patient. Patient was prepped and draped in the usual sterile fashion.       Clinical Data: No additional findings.   Subjective: Chief Complaint  Patient presents with  . Left Knee - Pain  . Right Knee - Pain  . Knee Pain    Bil knee pain worsening x 2 weeks, Fell on 01/31/18, bil knee pain, left worse than right, swellling, popping, clicking, grinding noises, difficulty walking, difficulty sleepin at night, ice, Tylenol helps  Mrs. Loose fell last month reinjuring her left knee.  I saw her in March with x-rays consistent with left knee arthritis.  This was treated with a cortisone injection with good relief of her pain.  She is had recurrence of her left knee pain since she had this fall in  June.  Seen in the emergency room last week with x-rays not demonstrating any acute changes.  She does live by herself and has been independent with a walker.  She still having some pain tickly with motion of her knee without localization  HPI  Review of Systems  Constitutional: Positive for activity change.  HENT: Negative for trouble swallowing.   Eyes: Negative for pain.  Respiratory: Negative for shortness of breath.   Cardiovascular: Positive for leg swelling.  Gastrointestinal: Positive for constipation.  Endocrine: Negative for cold intolerance.  Genitourinary: Negative for difficulty urinating.  Musculoskeletal: Positive for joint swelling.  Skin: Negative for rash.  Allergic/Immunologic: Positive for food allergies.  Neurological: Positive for weakness. Negative for numbness.  Hematological: Does not bruise/bleed easily.  Psychiatric/Behavioral: Positive for sleep disturbance.     Objective: Vital Signs: BP (!) 162/77 (BP Location: Left Arm, Patient Position: Sitting, Cuff Size: Normal)   Resp 18   Ht 5\' 3"  (1.6 m)   Wt 156 lb (70.8 kg)   BMI 27.63 kg/m   Physical Exam  Constitutional: She is oriented to person, place, and time. She appears well-developed and well-nourished.  HENT:  Mouth/Throat: Oropharynx is clear and moist.  Eyes: Pupils are equal, round, and reactive to light. EOM are normal.  Pulmonary/Chest: Effort normal.  Neurological: She is alert and oriented to person, place, and time.  Skin: Skin is warm and dry.  Psychiatric: She has a normal mood and affect. Her behavior is normal.    Ortho Exam awake alert and oriented x3.  Comfortable sitting.  Hard of hearing.  Left knee with small effusion.  Some medial lateral joint pain.  Mild patellar crepitation.  Lacks a few degrees to full extension with some discomfort in the popliteal space.  No popliteal mass.  No calf pain.  No distal edema.  Foot was nice and warm.  No pain with range of motion of left  hip.  Straight leg raise negative.  No evidence of any instability.  Skin intact about left knee with no ecchymosis or erythema  Specialty Comments:  No specialty comments available.  Imaging: No results found.   PMFS History: Patient Active Problem List   Diagnosis Date Noted  . Bilateral primary osteoarthritis of knee 07/22/2017  . Hyperlipidemia 05/20/2014  . Hypotension 08/02/2013  . UTI (lower urinary tract infection) 08/02/2013  . Acute renal failure ([place] [facility]) 08/02/2013  . ARF (acute renal failure) ([place]) 08/02/2013  . Calculus of gallbladder with acute cholecystitis, without mention of obstruction 07/10/2013  . Acute cholecystitis 06/17/2013  . Chest pain 06/17/2013  . Pulmonary infiltrate in left lung on chest x-ray 06/17/2013  . Hypertension   . CAD (coronary artery disease)    Past Medical History:  Diagnosis Date  . Arthritis   . CAD (coronary artery disease)    a. Mod prox LAD & first diagonal stenosis by cath 2002 - managed medically since that time with normal LV function.  . Cataract   . Hyperlipidemia   . Hypertension     Family History  Problem Relation Age of Onset  . Heart disease Mother        "enlarged valve"  . Cancer Mother        Ovarian  . Stroke Father   . Cancer Sister        Colon    Past Surgical History:  Procedure Laterality Date  . ABDOMINAL HYSTERECTOMY    . APPENDECTOMY    . BACK SURGERY    . CARPAL TUNNEL RELEASE    . CHOLECYSTECTOMY N/A 06/19/2013   Procedure: LAPAROSCOPIC CHOLECYSTECTOMY;  Surgeon: Harl Bowie, MD;  Location: [facility];  Service: General;  Laterality: N/A;  . ESOPHAGOGASTRODUODENOSCOPY N/A 04/26/2017   Procedure: ESOPHAGOGASTRODUODENOSCOPY (EGD) W/ DILATION;  Surgeon: Ronnette Juniper, MD;  Location: [facility];  Service: Gastroenterology;  Laterality: N/A;  . HERNIA REPAIR    . IR GENERIC HISTORICAL  03/18/2016   IR RADIOLOGIST EVAL & MGMT 03/18/2016 Corrie Mckusick, DO [facility] INTERV RAD  . KNEE SURGERY    . SHOULDER  SURGERY    . SPLENECTOMY, TOTAL     Social History   Occupational History  . Not on file  Tobacco Use  . Smoking status: Never Smoker  . Smokeless tobacco: Never Used  Substance and Sexual Activity  . Alcohol use: No  . Drug use: No  . Sexual activity: Not on file

## 2018-02-21 ENCOUNTER — Ambulatory Visit (INDEPENDENT_AMBULATORY_CARE_PROVIDER_SITE_OTHER): Payer: Medicare Other | Admitting: Orthopaedic Surgery

## 2018-02-27 ENCOUNTER — Ambulatory Visit (INDEPENDENT_AMBULATORY_CARE_PROVIDER_SITE_OTHER): Payer: Medicare Other | Admitting: Orthopaedic Surgery

## 2018-03-06 ENCOUNTER — Encounter: Payer: Self-pay | Admitting: Sports Medicine

## 2018-03-06 ENCOUNTER — Ambulatory Visit (INDEPENDENT_AMBULATORY_CARE_PROVIDER_SITE_OTHER): Payer: Medicare Other | Admitting: Sports Medicine

## 2018-03-06 ENCOUNTER — Ambulatory Visit (INDEPENDENT_AMBULATORY_CARE_PROVIDER_SITE_OTHER): Payer: Medicare Other

## 2018-03-06 DIAGNOSIS — M779 Enthesopathy, unspecified: Secondary | ICD-10-CM

## 2018-03-06 DIAGNOSIS — M722 Plantar fascial fibromatosis: Secondary | ICD-10-CM

## 2018-03-06 DIAGNOSIS — M79672 Pain in left foot: Secondary | ICD-10-CM | POA: Diagnosis not present

## 2018-03-06 MED ORDER — TRIAMCINOLONE ACETONIDE 10 MG/ML IJ SUSP: 10.0000 mg | Freq: Once | INTRAMUSCULAR | Status: DC

## 2018-03-06 NOTE — Progress Notes (Signed)
Subjective: [name] is a 84 y.o. female patient presents to office with complaint of moderate heel and arch pain on the left. Patient admits to post static dyskinesia for 1 day in duration. Patient has treated this problem nothing with no relief. Reports that she did not know what to do. Can not walk. Denies any other pedal complaints.   Review of Systems  Cardiovascular: Positive for leg swelling.  All other systems reviewed and are negative.   Patient Active Problem List   Diagnosis Date Noted  . Bilateral primary osteoarthritis of knee 07/22/2017  . Hyperlipidemia 05/20/2014  . Hypotension 08/02/2013  . UTI (lower urinary tract infection) 08/02/2013  . Acute renal failure ([facility] [place]) 08/02/2013  . ARF (acute renal failure) ([place]) 08/02/2013  . Calculus of gallbladder with acute cholecystitis, without mention of obstruction 07/10/2013  . Acute cholecystitis 06/17/2013  . Chest pain 06/17/2013  . Pulmonary infiltrate in left lung on chest x-ray 06/17/2013  . Hypertension   . CAD (coronary artery disease)     Current Outpatient Medications on File Prior to Visit  Medication Sig Dispense Refill  . acetaminophen (TYLENOL 8 HOUR ARTHRITIS PAIN) 650 MG CR tablet Take 650 mg by mouth every 8 (eight) hours as needed for pain.    Marland Kitchen ALPRAZolam (XANAX) 0.5 MG tablet Take 0.25-0.5 mg by mouth at bedtime as needed for sleep. Reported on 09/07/2015    . aspirin EC 81 MG tablet Take 81 mg by mouth daily.    . diclofenac sodium (VOLTAREN) 1 % GEL Apply 2 g topically 4 (four) times daily. (Patient not taking: Reported on 03/06/2018) 1 Tube 3  . hydrochlorothiazide (HYDRODIURIL) 25 MG tablet Take 25 mg by mouth daily.    . Multiple Vitamins-Minerals (PRESERVISION AREDS 2 PO) Take 1 tablet by mouth daily.    . Ondansetron HCl (ZOFRAN PO) Take 1 tablet by mouth daily as needed (nausea).    Vladimir Faster Glycol-Propyl Glycol (SYSTANE OP) Place 1 drop into both eyes 2 (two) times daily.     . polyethylene  glycol powder (GLYCOLAX/MIRALAX) powder Take 17 g by mouth daily as needed (for constipation).    . traMADol (ULTRAM) 50 MG tablet Take 1 tablet (50 mg total) by mouth every 6 (six) hours as needed. (Patient not taking: Reported on 03/06/2018) 15 tablet 0   No current facility-administered medications on file prior to visit.     Allergies  Allergen Reactions  . Felodipine Anaphylaxis and Other (See Comments)    Reaction: unknown possible nausea or rash  . Prednisone   . Shrimp [Shellfish Allergy] Nausea And Vomiting  . Statins Other (See Comments)    Muscle/leg pain.  Marland Kitchen Omeprazole Other (See Comments)    Reaction: unknown possible nausea or rash    Objective: Physical Exam General: The patient is alert and oriented x3 in no acute distress.  Dermatology: Skin is warm, dry and supple bilateral lower extremities. Nails 1-10 are normal. There is no erythema, edema, no eccymosis, no open lesions present. Integument is otherwise unremarkable.  Vascular: Dorsalis Pedis pulse and Posterior Tibial pulse are 1/4 bilateral. Capillary fill time is immediate to all digits.  Neurological: Grossly intact to light touch bilateral.  Musculoskeletal: Tenderness to palpation at the arch> medial calcaneal tubercale and through the insertion of the plantar fascia on the left foot. No pain with compression of calcaneus bilateral. No pain with tuning fork to calcaneus bilateral. No pain with calf compression bilateral. All other joints range of motion within  normal limits bilateral. Strength 4/5 in all groups bilateral.   Gait: Unassisted, Antalgic avoid weight on left arch and heel  Xray, Left foot:  Decreased osseous mineralization. Joint spaces preserved except at midtarsal joint. No fracture/dislocation/boney destruction. Calcaneal spur present with mild thickening of plantar fascia. No other soft tissue abnormalities or radiopaque foreign bodies.   Assessment and Plan: Problem List Items Addressed  This Visit    None    Visit Diagnoses    Plantar fasciitis    -  Primary   Relevant Medications   triamcinolone acetonide (KENALOG) 10 MG/ML injection 10 mg   Other Relevant Orders   DG Foot Complete Left   Arch pain of left foot       Relevant Medications   triamcinolone acetonide (KENALOG) 10 MG/ML injection 10 mg      -Complete examination performed.  -Xrays reviewed -Discussed with patient in detail the condition of plantar fasciitis and arch pain, how this occurs and general treatment options. Explained both conservative and surgical treatments.  -After oral consent and aseptic prep, injected a mixture containing 1 ml of 2%  plain lidocaine, 1 ml 0.5% plain marcaine, 0.5 ml of kenalog 10 and 0.5 ml of dexamethasone phosphate into left heel. Post-injection care discussed with patient.  -Recommend aspecreme and daily icing  -Recommended to return to office in 1 week if pain is no better.   Landis Martins, DPM

## 2018-03-06 NOTE — Progress Notes (Signed)
   Subjective:    Patient ID: [name], female    DOB: 02-Nov-1928, 84 y.o.   MRN: [id number]  HPI    Review of Systems  All other systems reviewed and are negative.      Objective:   Physical Exam        Assessment & Plan:

## 2018-03-14 ENCOUNTER — Ambulatory Visit: Payer: Medicare Other | Admitting: Sports Medicine

## 2018-04-05 DIAGNOSIS — M25562 Pain in left knee: Secondary | ICD-10-CM | POA: Diagnosis not present

## 2018-04-05 DIAGNOSIS — M1711 Unilateral primary osteoarthritis, right knee: Secondary | ICD-10-CM | POA: Diagnosis not present

## 2018-04-05 DIAGNOSIS — M170 Bilateral primary osteoarthritis of knee: Secondary | ICD-10-CM | POA: Diagnosis not present

## 2018-04-20 DIAGNOSIS — R739 Hyperglycemia, unspecified: Secondary | ICD-10-CM | POA: Diagnosis not present

## 2018-04-20 DIAGNOSIS — M25562 Pain in left knee: Secondary | ICD-10-CM | POA: Diagnosis not present

## 2018-04-20 DIAGNOSIS — K5900 Constipation, unspecified: Secondary | ICD-10-CM | POA: Diagnosis not present

## 2018-04-20 DIAGNOSIS — Z79899 Other long term (current) drug therapy: Secondary | ICD-10-CM | POA: Diagnosis not present

## 2018-04-20 DIAGNOSIS — N183 Chronic kidney disease, stage 3 (moderate): Secondary | ICD-10-CM | POA: Diagnosis not present

## 2018-04-20 DIAGNOSIS — I129 Hypertensive chronic kidney disease with stage 1 through stage 4 chronic kidney disease, or unspecified chronic kidney disease: Secondary | ICD-10-CM | POA: Diagnosis not present

## 2018-04-20 DIAGNOSIS — R634 Abnormal weight loss: Secondary | ICD-10-CM | POA: Diagnosis not present

## 2018-04-20 DIAGNOSIS — E213 Hyperparathyroidism, unspecified: Secondary | ICD-10-CM | POA: Diagnosis not present

## 2018-04-25 ENCOUNTER — Other Ambulatory Visit: Payer: Self-pay | Admitting: Geriatric Medicine

## 2018-04-25 DIAGNOSIS — R911 Solitary pulmonary nodule: Secondary | ICD-10-CM

## 2018-05-04 ENCOUNTER — Other Ambulatory Visit: Payer: Medicare Other

## 2018-05-04 ENCOUNTER — Ambulatory Visit
Admission: RE | Admit: 2018-05-04 | Discharge: 2018-05-04 | Disposition: A | Discharge status: Home / Self Care | Payer: Medicare Other | Source: Ambulatory Visit | Attending: Geriatric Medicine | Admitting: Geriatric Medicine

## 2018-05-04 DIAGNOSIS — R911 Solitary pulmonary nodule: Secondary | ICD-10-CM

## 2018-05-04 DIAGNOSIS — L821 Other seborrheic keratosis: Secondary | ICD-10-CM | POA: Diagnosis not present

## 2018-05-04 DIAGNOSIS — Z23 Encounter for immunization: Secondary | ICD-10-CM | POA: Diagnosis not present

## 2018-05-04 DIAGNOSIS — E7800 Pure hypercholesterolemia, unspecified: Secondary | ICD-10-CM | POA: Diagnosis not present

## 2018-05-04 DIAGNOSIS — M25562 Pain in left knee: Secondary | ICD-10-CM | POA: Diagnosis not present

## 2018-05-04 DIAGNOSIS — E1169 Type 2 diabetes mellitus with other specified complication: Secondary | ICD-10-CM | POA: Diagnosis not present

## 2018-05-04 DIAGNOSIS — I10 Essential (primary) hypertension: Secondary | ICD-10-CM | POA: Diagnosis not present

## 2018-05-10 ENCOUNTER — Inpatient Hospital Stay: Payer: Medicare Other

## 2018-05-10 ENCOUNTER — Inpatient Hospital Stay: Payer: Medicare Other | Attending: Oncology | Admitting: Oncology

## 2018-05-10 ENCOUNTER — Telehealth: Payer: Self-pay | Admitting: Oncology

## 2018-05-10 VITALS — BP 159/64 | HR 60 | Temp 97.9°F | Resp 17 | Ht 63.0 in | Wt 149.1 lb

## 2018-05-10 DIAGNOSIS — D471 Chronic myeloproliferative disease: Secondary | ICD-10-CM

## 2018-05-10 DIAGNOSIS — R634 Abnormal weight loss: Secondary | ICD-10-CM | POA: Diagnosis not present

## 2018-05-10 DIAGNOSIS — D696 Thrombocytopenia, unspecified: Secondary | ICD-10-CM

## 2018-05-10 DIAGNOSIS — R918 Other nonspecific abnormal finding of lung field: Secondary | ICD-10-CM | POA: Insufficient documentation

## 2018-05-10 DIAGNOSIS — D693 Immune thrombocytopenic purpura: Secondary | ICD-10-CM | POA: Diagnosis not present

## 2018-05-10 LAB — CBC WITH DIFFERENTIAL (CANCER CENTER ONLY)
Basophils Absolute: 0 10*3/uL (ref 0.0–0.1)
Basophils Relative: 0 %
Eosinophils Absolute: 0.1 10*3/uL (ref 0.0–0.5)
Eosinophils Relative: 1 %
HCT: 46.2 % (ref 34.8–46.6)
Hemoglobin: 15.5 g/dL (ref 11.6–15.9)
Lymphocytes Relative: 24 %
Lymphs Abs: 1.5 10*3/uL (ref 0.9–3.3)
MCH: 30.5 pg (ref 25.1–34.0)
MCHC: 33.5 g/dL (ref 31.5–36.0)
MCV: 90.8 fL (ref 79.5–101.0)
Monocytes Absolute: 0.9 10*3/uL (ref 0.1–0.9)
Monocytes Relative: 14 %
Neutro Abs: 3.9 10*3/uL (ref 1.5–6.5)
Neutrophils Relative %: 61 %
Platelet Count: 251 10*3/uL (ref 145–400)
RBC: 5.09 MIL/uL (ref 3.70–5.45)
RDW: 16.2 % — ABNORMAL HIGH (ref 11.2–14.5)
WBC Count: 6.4 10*3/uL (ref 3.9–10.3)

## 2018-05-10 NOTE — Progress Notes (Signed)
Hematology and Oncology Follow Up Visit  [name] [id number] October 23, 1928 84 y.o. 05/10/2018 9:48 AM Stoneking, Christiane Ha, MDStoneking, Christiane Ha, MD   Principle Diagnosis: 83 year old with:     1. Extramedullary hematopoiesis.  Presented with intra-abdominal masses that is biopsy-proven to suggest extra medullary hematopoiesis in 2017.  The etiology is related to underlying myeloproliferative disorder versus history of splenectomy.  2.  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  Neck to me.   Current therapy: Active surveillance.  Interim History: [name] presents today for a follow-up.  Since the last visit, she reports no major changes in her health.  She continues to be ambulatory with the help of a walker without any falls or syncope.  She does have chronic knee pain that has required injection related to arthritis.  Appetite has not really changed although has declined some.  She has lost close to 8 pounds in the last year.  He is not intentionally trying to lose weight as much.  She denies any abdominal discomfort or early satiety.  She denies any dysphasia or odontophagia.  Her quality of life is not dramatically different.  She does not report any headaches, blurry vision, syncope or seizures.  Duration mental status or confusion.  She does not report any fevers, chills or sweats. She does not report any cough, wheezing or hemoptysis. She does not report any chest pain, palpitation, orthopnea or leg edema. She does not report any nausea, vomiting.  Denies any changes in her bowel habits.  She does not report any frequency urgency or hesitancy.  He does not report any lymphadenopathy or petechiae.  She denies any bleeding or clotting tendency.  Remaining review of systems is negative.  Medications: I have reviewed the patient's current medications.  Current Outpatient Medications  Medication Sig Dispense Refill  . acetaminophen (TYLENOL 8 HOUR ARTHRITIS PAIN) 650 MG CR tablet Take 650  mg by mouth every 8 (eight) hours as needed for pain.    Marland Kitchen ALPRAZolam (XANAX) 0.5 MG tablet Take 0.25-0.5 mg by mouth at bedtime as needed for sleep. Reported on 09/07/2015    . aspirin EC 81 MG tablet Take 81 mg by mouth daily.    . diclofenac sodium (VOLTAREN) 1 % GEL Apply 2 g topically 4 (four) times daily. (Patient not taking: Reported on 03/06/2018) 1 Tube 3  . hydrochlorothiazide (HYDRODIURIL) 25 MG tablet Take 25 mg by mouth daily.    . Multiple Vitamins-Minerals (PRESERVISION AREDS 2 PO) Take 1 tablet by mouth daily.    . Ondansetron HCl (ZOFRAN PO) Take 1 tablet by mouth daily as needed (nausea).    Vladimir Faster Glycol-Propyl Glycol (SYSTANE OP) Place 1 drop into both eyes 2 (two) times daily.     . polyethylene glycol powder (GLYCOLAX/MIRALAX) powder Take 17 g by mouth daily as needed (for constipation).    . traMADol (ULTRAM) 50 MG tablet Take 1 tablet (50 mg total) by mouth every 6 (six) hours as needed. (Patient not taking: Reported on 03/06/2018) 15 tablet 0   Current Facility-Administered Medications  Medication Dose Route Frequency Provider Last Rate Last Dose  . triamcinolone acetonide (KENALOG) 10 MG/ML injection 10 mg  10 mg Other Once Landis Martins, DPM         Allergies:  Allergies  Allergen Reactions  . Felodipine Anaphylaxis and Other (See Comments)    Reaction: unknown possible nausea or rash  . Prednisone   . Shrimp [Shellfish Allergy] Nausea And Vomiting  . Statins Other (  See Comments)    Muscle/leg pain.  Marland Kitchen Omeprazole Other (See Comments)    Reaction: unknown possible nausea or rash    Past Medical History, Surgical history, Social history, and Family History were reviewed and updated.   Physical Exam: Blood pressure (!) 159/64, pulse 60, temperature 97.9 F (36.6 C), temperature source Oral, resp. rate 17, height 5' 3"  (1.6 m), weight 149 lb 1.6 oz (67.6 kg), SpO2 97 %.    ECOG: 0  General appearance: Comfortable appearing without any  discomfort Head: Normocephalic without any trauma Oropharynx: Mucous membranes are moist and pink without any thrush or ulcers. Eyes: Pupils are equal and round reactive to light. Lymph nodes: No cervical, supraclavicular, inguinal or axillary lymphadenopathy.   Heart:regular rate and rhythm.  S1 and S2 without leg edema. Lung: Clear without any rhonchi or wheezes.  No dullness to percussion. Abdomin: Soft, nontender, nondistended with good bowel sounds.  No hepatosplenomegaly. Musculoskeletal: No joint deformity or effusion.  Full range of motion noted. Neurological: No deficits noted on motor, sensory and deep tendon reflex exam. Skin: No petechial rash or dryness.  Appeared moist.    Lab Results: Lab Results  Component Value Date   WBC 6.8 09/06/2017   HGB 15.1 09/06/2017   HCT 46.8 (H) 09/06/2017   MCV 91.7 09/06/2017   PLT 225 09/06/2017     Chemistry      Component Value Date/Time   NA 139 09/07/2016 0958   K 3.9 09/07/2016 0958   CL 105 08/03/2013 0550   CO2 24 09/07/2016 0958   BUN 27.2 (H) 09/07/2016 0958   CREATININE 1.0 09/07/2016 0958      Component Value Date/Time   CALCIUM 10.4 09/07/2016 0958   ALKPHOS 82 09/07/2016 0958   AST 25 09/07/2016 0958   ALT 15 09/07/2016 0958   BILITOT 0.35 09/07/2016 0958     CBC    Component Value Date/Time   WBC 6.4 05/10/2018 0933   WBC 6.8 09/06/2017 0917   RBC 5.09 05/10/2018 0933   HGB 15.5 05/10/2018 0933   HGB 10.6 (L) 03/08/2017 1016   HCT 46.2 05/10/2018 0933   HCT 32.3 (L) 03/08/2017 1016   PLT 251 05/10/2018 0933   PLT 362 03/08/2017 1016   MCV 90.8 05/10/2018 0933   MCV 87.3 03/08/2017 1016   MCH 30.5 05/10/2018 0933   MCHC 33.5 05/10/2018 0933   RDW 16.2 (H) 05/10/2018 0933   RDW 15.6 (H) 03/08/2017 1016   LYMPHSABS 1.5 05/10/2018 0933   LYMPHSABS 1.4 03/08/2017 1016   MONOABS 0.9 05/10/2018 0933   MONOABS 1.1 (H) 03/08/2017 1016   EOSABS 0.1 05/10/2018 0933   EOSABS 0.2 03/08/2017 1016    BASOSABS 0.0 05/10/2018 0933   BASOSABS 0.0 03/08/2017 1016   CLINICAL DATA:  84 year old female with left lung base nodule on March CT Abdomen and Pelvis.  EXAM: CT CHEST WITHOUT CONTRAST  TECHNIQUE: Multidetector CT imaging of the chest was performed following the standard protocol without IV contrast.  COMPARISON:  CT Abdomen and Pelvis 10/18/2017.  FINDINGS: Cardiovascular: Calcified coronary artery atherosclerosis and Calcified aortic atherosclerosis. Vascular patency is not evaluated in the absence of IV contrast. Stable cardiac size, within normal limits. No pericardial effusion.  Mediastinum/Nodes: No mediastinal lymphadenopathy. Moderate size gastric hiatal hernia appears smaller than on 10/18/2017.  Lungs/Pleura: Resolved small subpleural left lower lobe lung nodule since the prior CT Abdomen and Pelvis (series 8, image 127 today). Stable smaller nearby 2-3 millimeter nodule on series 8, image  120, and there is a nearby calcified granuloma on image 114. Tiny calcified granuloma in the superior segment of the left lower lobe.  Mild left apical lung scarring.  Similar scarring in the lingula.  The major airways are patent. Occasional mild scarring in the right lung. There is a tiny 1-2 millimeter subpleural nodule along the minor fissure on series 8, image 89.  Upper Abdomen: Stable visible upper abdominal viscera as described on 10/18/2017. This includes the lobulated partially fat density into the anterior to the left hepatic lobe seen on series 2, image 42 today.  Musculoskeletal: No acute osseous abnormality identified. Partially visible lumbar interbody implant.  IMPRESSION: 1. The small left lung base nodule seen on 10/18/2017 has resolved and was inflammatory. Several small calcified granulomas are noted in the lungs along with occasional tiny noncalcified lung nodules which are most likely benign. If this patient is high risk then  non-contrast Chest CT follow-up can be obtained 1 year from now. This recommendation follows the consensus statement: Guidelines for Management of Incidental Pulmonary Nodules Detected on CT Images: From the Fleischner Society 2017; Radiology 2017; 284:228-243. 2. Aortic Atherosclerosis (ICD10-I70.0). Calcified coronary artery atherosclerosis. 3. Chronic hiatal hernia, mildly smaller than in March. Otherwise stable visible upper abdomen as described on 10/18/2017.  Impression and Plan:  84 year old woman with:  1. Extramedullary hematopoiesis detected on a CT scan routinely in 2017.  This is discovered in the setting of a remote history of splenectomy..    The natural course of this finding was reviewed today as well as the differential diagnosis.  She did have a CT scan of the chest obtained on 05/04/2018 which I personally reviewed and discussed with her and did not show any evidence of abnormalities in the upper abdomen.  Myeloproliferative disorder still a possibility but could also be related to her previous splenectomy.  For management standpoint, choices to proceed with active surveillance versus proceeding with a bone marrow biopsy.  Her CBC today is perfectly normal and does not really indicate a mild proliferative disorder that requires intervention.  At this time, we have elected continue with active surveillance.  2.  Thrombocytopenia: Related to ITP in the 90s.  Her platelet count is back to normal.  3.  Pulmonary nodules: Appears to be benign based on the CT scan in September 2019.  4.  Weight loss: Unclear etiology.  Malignancy is considered less likely but a possibility.  CT scan in September 2019 did not show any clear-cut malignancy in the upper abdomen or lung.  Follow-up with her primary care physician regarding this issue.   5. Follow-up: Will be in 1 year.  15  minutes was spent with the patient face-to-face today.  More than 50% of time was dedicated to reviewing  the differential diagnosis, imaging studies and laboratory data.     Zola Button, MD 9/25/20199:48 AM

## 2018-05-10 NOTE — Telephone Encounter (Signed)
Gave pt avs and calendar  °

## 2018-05-11 DIAGNOSIS — M1711 Unilateral primary osteoarthritis, right knee: Secondary | ICD-10-CM | POA: Diagnosis not present

## 2018-05-11 DIAGNOSIS — M1712 Unilateral primary osteoarthritis, left knee: Secondary | ICD-10-CM | POA: Diagnosis not present

## 2018-05-17 ENCOUNTER — Inpatient Hospital Stay (HOSPITAL_COMMUNITY)
Admission: EM | Admit: 2018-05-17 | Discharge: 2018-05-25 | DRG: 354 | Disposition: A | Discharge status: Skilled Nursing Facility | Payer: Medicare Other | Attending: Internal Medicine | Admitting: Internal Medicine

## 2018-05-17 ENCOUNTER — Encounter (HOSPITAL_COMMUNITY): Payer: Self-pay

## 2018-05-17 DIAGNOSIS — K56609 Unspecified intestinal obstruction, unspecified as to partial versus complete obstruction: Secondary | ICD-10-CM | POA: Diagnosis present

## 2018-05-17 DIAGNOSIS — R110 Nausea: Secondary | ICD-10-CM | POA: Diagnosis not present

## 2018-05-17 DIAGNOSIS — R1033 Periumbilical pain: Secondary | ICD-10-CM | POA: Diagnosis not present

## 2018-05-17 DIAGNOSIS — K430 Incisional hernia with obstruction, without gangrene: Secondary | ICD-10-CM

## 2018-05-17 DIAGNOSIS — Z79899 Other long term (current) drug therapy: Secondary | ICD-10-CM

## 2018-05-17 DIAGNOSIS — R109 Unspecified abdominal pain: Secondary | ICD-10-CM | POA: Diagnosis not present

## 2018-05-17 DIAGNOSIS — I2510 Atherosclerotic heart disease of native coronary artery without angina pectoris: Secondary | ICD-10-CM | POA: Diagnosis present

## 2018-05-17 DIAGNOSIS — J9811 Atelectasis: Secondary | ICD-10-CM | POA: Diagnosis not present

## 2018-05-17 DIAGNOSIS — Z8249 Family history of ischemic heart disease and other diseases of the circulatory system: Secondary | ICD-10-CM

## 2018-05-17 DIAGNOSIS — Z9081 Acquired absence of spleen: Secondary | ICD-10-CM

## 2018-05-17 DIAGNOSIS — Z888 Allergy status to other drugs, medicaments and biological substances status: Secondary | ICD-10-CM

## 2018-05-17 DIAGNOSIS — Z7982 Long term (current) use of aspirin: Secondary | ICD-10-CM

## 2018-05-17 DIAGNOSIS — E785 Hyperlipidemia, unspecified: Secondary | ICD-10-CM | POA: Diagnosis present

## 2018-05-17 DIAGNOSIS — R1084 Generalized abdominal pain: Secondary | ICD-10-CM | POA: Diagnosis not present

## 2018-05-17 DIAGNOSIS — R52 Pain, unspecified: Secondary | ICD-10-CM | POA: Diagnosis not present

## 2018-05-17 DIAGNOSIS — R1111 Vomiting without nausea: Secondary | ICD-10-CM | POA: Diagnosis not present

## 2018-05-17 DIAGNOSIS — K859 Acute pancreatitis without necrosis or infection, unspecified: Secondary | ICD-10-CM | POA: Diagnosis not present

## 2018-05-17 DIAGNOSIS — Z01818 Encounter for other preprocedural examination: Secondary | ICD-10-CM

## 2018-05-17 DIAGNOSIS — I10 Essential (primary) hypertension: Secondary | ICD-10-CM | POA: Diagnosis present

## 2018-05-17 DIAGNOSIS — E876 Hypokalemia: Secondary | ICD-10-CM | POA: Diagnosis not present

## 2018-05-17 DIAGNOSIS — L89322 Pressure ulcer of left buttock, stage 2: Secondary | ICD-10-CM | POA: Diagnosis present

## 2018-05-17 DIAGNOSIS — K5730 Diverticulosis of large intestine without perforation or abscess without bleeding: Secondary | ICD-10-CM | POA: Diagnosis not present

## 2018-05-17 DIAGNOSIS — Z66 Do not resuscitate: Secondary | ICD-10-CM | POA: Diagnosis not present

## 2018-05-17 DIAGNOSIS — M1990 Unspecified osteoarthritis, unspecified site: Secondary | ICD-10-CM | POA: Diagnosis present

## 2018-05-17 DIAGNOSIS — Z91013 Allergy to seafood: Secondary | ICD-10-CM

## 2018-05-17 DIAGNOSIS — K219 Gastro-esophageal reflux disease without esophagitis: Secondary | ICD-10-CM | POA: Diagnosis present

## 2018-05-17 DIAGNOSIS — Z90710 Acquired absence of both cervix and uterus: Secondary | ICD-10-CM

## 2018-05-17 DIAGNOSIS — L8990 Pressure ulcer of unspecified site, unspecified stage: Secondary | ICD-10-CM

## 2018-05-17 DIAGNOSIS — K449 Diaphragmatic hernia without obstruction or gangrene: Secondary | ICD-10-CM | POA: Diagnosis present

## 2018-05-17 NOTE — ED Triage Notes (Signed)
Pt complains of abdominal pain with nausea and vomiting since 7pm Pt has a hernia that she has had for a year

## 2018-05-17 NOTE — ED Notes (Signed)
Bed: I[facility]03 Expected date: 05/17/18 Expected time:  Means of arrival:  Comments:

## 2018-05-18 ENCOUNTER — Other Ambulatory Visit: Payer: Self-pay

## 2018-05-18 ENCOUNTER — Emergency Department (HOSPITAL_COMMUNITY): Payer: Medicare Other

## 2018-05-18 ENCOUNTER — Encounter (HOSPITAL_COMMUNITY): Payer: Self-pay | Admitting: Emergency Medicine

## 2018-05-18 ENCOUNTER — Inpatient Hospital Stay (HOSPITAL_COMMUNITY): Payer: Medicare Other

## 2018-05-18 DIAGNOSIS — Z9981 Dependence on supplemental oxygen: Secondary | ICD-10-CM | POA: Diagnosis not present

## 2018-05-18 DIAGNOSIS — K430 Incisional hernia with obstruction, without gangrene: Secondary | ICD-10-CM | POA: Diagnosis present

## 2018-05-18 DIAGNOSIS — K449 Diaphragmatic hernia without obstruction or gangrene: Secondary | ICD-10-CM | POA: Diagnosis present

## 2018-05-18 DIAGNOSIS — L89322 Pressure ulcer of left buttock, stage 2: Secondary | ICD-10-CM | POA: Diagnosis present

## 2018-05-18 DIAGNOSIS — E876 Hypokalemia: Secondary | ICD-10-CM | POA: Diagnosis present

## 2018-05-18 DIAGNOSIS — E785 Hyperlipidemia, unspecified: Secondary | ICD-10-CM | POA: Diagnosis present

## 2018-05-18 DIAGNOSIS — M6281 Muscle weakness (generalized): Secondary | ICD-10-CM | POA: Diagnosis not present

## 2018-05-18 DIAGNOSIS — I10 Essential (primary) hypertension: Secondary | ICD-10-CM

## 2018-05-18 DIAGNOSIS — Z66 Do not resuscitate: Secondary | ICD-10-CM | POA: Diagnosis present

## 2018-05-18 DIAGNOSIS — Z79899 Other long term (current) drug therapy: Secondary | ICD-10-CM | POA: Diagnosis not present

## 2018-05-18 DIAGNOSIS — K219 Gastro-esophageal reflux disease without esophagitis: Secondary | ICD-10-CM | POA: Diagnosis present

## 2018-05-18 DIAGNOSIS — R2689 Other abnormalities of gait and mobility: Secondary | ICD-10-CM | POA: Diagnosis not present

## 2018-05-18 DIAGNOSIS — Z4682 Encounter for fitting and adjustment of non-vascular catheter: Secondary | ICD-10-CM | POA: Diagnosis not present

## 2018-05-18 DIAGNOSIS — K56609 Unspecified intestinal obstruction, unspecified as to partial versus complete obstruction: Secondary | ICD-10-CM

## 2018-05-18 DIAGNOSIS — Z888 Allergy status to other drugs, medicaments and biological substances status: Secondary | ICD-10-CM | POA: Diagnosis not present

## 2018-05-18 DIAGNOSIS — R451 Restlessness and agitation: Secondary | ICD-10-CM | POA: Diagnosis not present

## 2018-05-18 DIAGNOSIS — R109 Unspecified abdominal pain: Secondary | ICD-10-CM | POA: Diagnosis not present

## 2018-05-18 DIAGNOSIS — R110 Nausea: Secondary | ICD-10-CM | POA: Diagnosis not present

## 2018-05-18 DIAGNOSIS — K56600 Partial intestinal obstruction, unspecified as to cause: Secondary | ICD-10-CM | POA: Diagnosis not present

## 2018-05-18 DIAGNOSIS — Z90710 Acquired absence of both cervix and uterus: Secondary | ICD-10-CM | POA: Diagnosis not present

## 2018-05-18 DIAGNOSIS — Z48815 Encounter for surgical aftercare following surgery on the digestive system: Secondary | ICD-10-CM | POA: Diagnosis not present

## 2018-05-18 DIAGNOSIS — I2510 Atherosclerotic heart disease of native coronary artery without angina pectoris: Secondary | ICD-10-CM | POA: Diagnosis present

## 2018-05-18 DIAGNOSIS — Z91013 Allergy to seafood: Secondary | ICD-10-CM | POA: Diagnosis not present

## 2018-05-18 DIAGNOSIS — K432 Incisional hernia without obstruction or gangrene: Secondary | ICD-10-CM | POA: Diagnosis not present

## 2018-05-18 DIAGNOSIS — E569 Vitamin deficiency, unspecified: Secondary | ICD-10-CM | POA: Diagnosis not present

## 2018-05-18 DIAGNOSIS — H04123 Dry eye syndrome of bilateral lacrimal glands: Secondary | ICD-10-CM | POA: Diagnosis not present

## 2018-05-18 DIAGNOSIS — Z743 Need for continuous supervision: Secondary | ICD-10-CM | POA: Diagnosis not present

## 2018-05-18 DIAGNOSIS — Z9081 Acquired absence of spleen: Secondary | ICD-10-CM | POA: Diagnosis not present

## 2018-05-18 DIAGNOSIS — Z9841 Cataract extraction status, right eye: Secondary | ICD-10-CM | POA: Diagnosis not present

## 2018-05-18 DIAGNOSIS — I361 Nonrheumatic tricuspid (valve) insufficiency: Secondary | ICD-10-CM | POA: Diagnosis not present

## 2018-05-18 DIAGNOSIS — Z8249 Family history of ischemic heart disease and other diseases of the circulatory system: Secondary | ICD-10-CM | POA: Diagnosis not present

## 2018-05-18 DIAGNOSIS — J9811 Atelectasis: Secondary | ICD-10-CM | POA: Diagnosis present

## 2018-05-18 DIAGNOSIS — E568 Deficiency of other vitamins: Secondary | ICD-10-CM | POA: Diagnosis not present

## 2018-05-18 DIAGNOSIS — K5730 Diverticulosis of large intestine without perforation or abscess without bleeding: Secondary | ICD-10-CM | POA: Diagnosis not present

## 2018-05-18 DIAGNOSIS — R279 Unspecified lack of coordination: Secondary | ICD-10-CM | POA: Diagnosis not present

## 2018-05-18 DIAGNOSIS — M1990 Unspecified osteoarthritis, unspecified site: Secondary | ICD-10-CM | POA: Diagnosis present

## 2018-05-18 DIAGNOSIS — R112 Nausea with vomiting, unspecified: Secondary | ICD-10-CM | POA: Diagnosis not present

## 2018-05-18 DIAGNOSIS — Z7982 Long term (current) use of aspirin: Secondary | ICD-10-CM | POA: Diagnosis not present

## 2018-05-18 LAB — COMPREHENSIVE METABOLIC PANEL
ALT: 18 U/L (ref 0–44)
AST: 27 U/L (ref 15–41)
Albumin: 3.8 g/dL (ref 3.5–5.0)
Alkaline Phosphatase: 68 U/L (ref 38–126)
Anion gap: 15 (ref 5–15)
BUN: 27 mg/dL — ABNORMAL HIGH (ref 8–23)
CO2: 24 mmol/L (ref 22–32)
Calcium: 10.1 mg/dL (ref 8.9–10.3)
Chloride: 99 mmol/L (ref 98–111)
Creatinine, Ser: 0.92 mg/dL (ref 0.44–1.00)
GFR calc Af Amer: >60 mL/min (ref >60)
GFR calc non Af Amer: 54 mL/min — ABNORMAL LOW (ref >60)
Glucose, Bld: 186 mg/dL — ABNORMAL HIGH (ref 70–99)
Potassium: 4.1 mmol/L (ref 3.5–5.1)
Sodium: 138 mmol/L (ref 135–145)
Total Bilirubin: 0.7 mg/dL (ref 0.3–1.2)
Total Protein: 6.4 g/dL — ABNORMAL LOW (ref 6.5–8.1)

## 2018-05-18 LAB — CBC WITH DIFFERENTIAL/PLATELET
Basophils Absolute: 0 10*3/uL (ref 0.0–0.1)
Basophils Absolute: 0 10*3/uL (ref 0.0–0.1)
Basophils Relative: 0 %
Basophils Relative: 0 %
Eosinophils Absolute: 0 10*3/uL (ref 0.0–0.7)
Eosinophils Absolute: 0 10*3/uL (ref 0.0–0.7)
Eosinophils Relative: 0 %
Eosinophils Relative: 0 %
HCT: 46.2 % — ABNORMAL HIGH (ref 36.0–46.0)
HCT: 46.7 % — ABNORMAL HIGH (ref 36.0–46.0)
Hemoglobin: 15.4 g/dL — ABNORMAL HIGH (ref 12.0–15.0)
Hemoglobin: 15.6 g/dL — ABNORMAL HIGH (ref 12.0–15.0)
Lymphocytes Relative: 16 %
Lymphocytes Relative: 9 %
Lymphs Abs: 1 10*3/uL (ref 0.7–4.0)
Lymphs Abs: 1.2 10*3/uL (ref 0.7–4.0)
MCH: 30.3 pg (ref 26.0–34.0)
MCH: 30.4 pg (ref 26.0–34.0)
MCHC: 33.3 g/dL (ref 30.0–36.0)
MCHC: 33.4 g/dL (ref 30.0–36.0)
MCV: 90.8 fL (ref 78.0–100.0)
MCV: 91 fL (ref 78.0–100.0)
Monocytes Absolute: 0.3 10*3/uL (ref 0.1–1.0)
Monocytes Absolute: 0.5 10*3/uL (ref 0.1–1.0)
Monocytes Relative: 4 %
Monocytes Relative: 4 %
Neutro Abs: 10.2 10*3/uL — ABNORMAL HIGH (ref 1.7–7.7)
Neutro Abs: 6.2 10*3/uL (ref 1.7–7.7)
Neutrophils Relative %: 80 %
Neutrophils Relative %: 87 %
Platelets: 227 10*3/uL (ref 150–400)
Platelets: 234 10*3/uL (ref 150–400)
RBC: 5.09 MIL/uL (ref 3.87–5.11)
RBC: 5.13 MIL/uL — ABNORMAL HIGH (ref 3.87–5.11)
RDW: 16.1 % — ABNORMAL HIGH (ref 11.5–15.5)
RDW: 16.1 % — ABNORMAL HIGH (ref 11.5–15.5)
WBC: 11.7 10*3/uL — ABNORMAL HIGH (ref 4.0–10.5)
WBC: 7.8 10*3/uL (ref 4.0–10.5)

## 2018-05-18 LAB — HEPATIC FUNCTION PANEL
ALT: 20 U/L (ref 0–44)
AST: 26 U/L (ref 15–41)
Albumin: 3.8 g/dL (ref 3.5–5.0)
Alkaline Phosphatase: 69 U/L (ref 38–126)
Bilirubin, Direct: 0.1 mg/dL (ref 0.0–0.2)
Indirect Bilirubin: 0.5 mg/dL (ref 0.3–0.9)
Total Bilirubin: 0.6 mg/dL (ref 0.3–1.2)
Total Protein: 6.6 g/dL (ref 6.5–8.1)

## 2018-05-18 LAB — URINALYSIS, ROUTINE W REFLEX MICROSCOPIC
Bilirubin Urine: NEGATIVE
Glucose, UA: NEGATIVE mg/dL
Hgb urine dipstick: NEGATIVE
Ketones, ur: 5 mg/dL — AB
Nitrite: NEGATIVE
Protein, ur: NEGATIVE mg/dL
Specific Gravity, Urine: 1.015 (ref 1.005–1.030)
pH: 7 (ref 5.0–8.0)

## 2018-05-18 LAB — BASIC METABOLIC PANEL
Anion gap: 13 (ref 5–15)
BUN: 25 mg/dL — ABNORMAL HIGH (ref 8–23)
CO2: 26 mmol/L (ref 22–32)
Calcium: 9.7 mg/dL (ref 8.9–10.3)
Chloride: 98 mmol/L (ref 98–111)
Creatinine, Ser: 0.82 mg/dL (ref 0.44–1.00)
GFR calc Af Amer: >60 mL/min (ref >60)
GFR calc non Af Amer: >60 mL/min (ref >60)
Glucose, Bld: 170 mg/dL — ABNORMAL HIGH (ref 70–99)
Potassium: 3.8 mmol/L (ref 3.5–5.1)
Sodium: 137 mmol/L (ref 135–145)

## 2018-05-18 LAB — LIPASE, BLOOD: Lipase: 104 U/L — ABNORMAL HIGH (ref 11–51)

## 2018-05-18 LAB — MAGNESIUM: Magnesium: 1.5 mg/dL — ABNORMAL LOW (ref 1.7–2.4)

## 2018-05-18 LAB — TYPE AND SCREEN
ABO/RH(D): O POS
Antibody Screen: NEGATIVE

## 2018-05-18 LAB — LACTIC ACID, PLASMA: Lactic Acid, Venous: 1.9 mmol/L (ref 0.5–1.9)

## 2018-05-18 LAB — ABO/RH: ABO/RH(D): O POS

## 2018-05-18 MED ORDER — ONDANSETRON HCL 4 MG PO TABS
4.0000 mg | ORAL_TABLET | Freq: Four times a day (QID) | ORAL | Status: DC | PRN
  Administered 2018-05-20: 4 mg via ORAL
  Filled 2018-05-18: qty 1

## 2018-05-18 MED ORDER — ONDANSETRON HCL 4 MG/2ML IJ SOLN
4.0000 mg | Freq: Four times a day (QID) | INTRAMUSCULAR | Status: DC | PRN
  Administered 2018-05-18 – 2018-05-24 (×5): 4 mg via INTRAVENOUS
  Filled 2018-05-18 (×6): qty 2

## 2018-05-18 MED ORDER — FENTANYL CITRATE (PF) 100 MCG/2ML IJ SOLN
50.0000 ug | INTRAMUSCULAR | Status: DC | PRN
  Administered 2018-05-18: 50 ug via INTRAVENOUS
  Filled 2018-05-18: qty 2

## 2018-05-18 MED ORDER — ACETAMINOPHEN 325 MG PO TABS: 650.0000 mg | ORAL_TABLET | Freq: Four times a day (QID) | ORAL | Status: DC | PRN

## 2018-05-18 MED ORDER — SODIUM CHLORIDE 0.9 % IV SOLN
INTRAVENOUS | Status: DC
Start: 2018-05-18 — End: 2018-05-22
  Administered 2018-05-18: 03:00:00 via INTRAVENOUS
  Administered 2018-05-19: 75 mL/h via INTRAVENOUS
  Administered 2018-05-19 – 2018-05-21 (×6): via INTRAVENOUS

## 2018-05-18 MED ORDER — SODIUM CHLORIDE 0.9 % IV SOLN: INTRAVENOUS | Status: DC

## 2018-05-18 MED ORDER — HYDRALAZINE HCL 20 MG/ML IJ SOLN: 10.0000 mg | INTRAMUSCULAR | Status: DC | PRN

## 2018-05-18 MED ORDER — MAGNESIUM SULFATE 2 GM/50ML IV SOLN
2.0000 g | Freq: Once | INTRAVENOUS | Status: AC
  Administered 2018-05-18: 2 g via INTRAVENOUS
  Filled 2018-05-18: qty 50

## 2018-05-18 MED ORDER — FENTANYL CITRATE (PF) 100 MCG/2ML IJ SOLN
100.0000 ug | Freq: Once | INTRAMUSCULAR | Status: AC
  Administered 2018-05-18: 100 ug via INTRAVENOUS
  Filled 2018-05-18: qty 2

## 2018-05-18 MED ORDER — FENTANYL CITRATE (PF) 100 MCG/2ML IJ SOLN
50.0000 ug | Freq: Once | INTRAMUSCULAR | Status: AC
  Administered 2018-05-18: 50 ug via INTRAVENOUS
  Filled 2018-05-18: qty 2

## 2018-05-18 MED ORDER — LIDOCAINE HCL URETHRAL/MUCOSAL 2 % EX GEL
CUTANEOUS | Status: AC
  Administered 2018-05-18: 11
  Filled 2018-05-18: qty 5

## 2018-05-18 MED ORDER — FENTANYL CITRATE (PF) 100 MCG/2ML IJ SOLN
25.0000 ug | INTRAMUSCULAR | Status: DC | PRN
  Administered 2018-05-18 (×2): 25 ug via INTRAVENOUS
  Filled 2018-05-18 (×2): qty 2

## 2018-05-18 MED ORDER — SODIUM CHLORIDE 0.9 % IV SOLN
2.0000 g | INTRAVENOUS | Status: AC
  Administered 2018-05-19: 2 g via INTRAVENOUS
  Filled 2018-05-18: qty 2

## 2018-05-18 MED ORDER — ACETAMINOPHEN 650 MG RE SUPP: 650.0000 mg | Freq: Four times a day (QID) | RECTAL | Status: DC | PRN

## 2018-05-18 MED ORDER — ONDANSETRON HCL 4 MG/2ML IJ SOLN
4.0000 mg | Freq: Once | INTRAMUSCULAR | Status: AC
  Administered 2018-05-18: 4 mg via INTRAVENOUS
  Filled 2018-05-18: qty 2

## 2018-05-18 MED ORDER — HYDRALAZINE HCL 20 MG/ML IJ SOLN
10.0000 mg | INTRAMUSCULAR | Status: DC | PRN
  Administered 2018-05-20 – 2018-05-25 (×6): 10 mg via INTRAVENOUS
  Filled 2018-05-18 (×6): qty 1

## 2018-05-18 NOTE — H&P (Signed)
History and Physical    [name] HEN:[id number] DOB: May 27, 1929 DOA: 05/17/2018  PCP: Lajean Manes, MD  Patient coming from: Home.  Chief Complaint: Abdominal pain nausea vomiting.  HPI: [name] is a 84 y.o. female with history of CAD, hypertension presents to the ER because of worsening abdominal pain.  Patient states she has been having abdominal pain and discomfort for over a year but history became acutely worsened.  Patient symptoms started last evening around 6 PM.  Patient states she had a regular bowel movement yesterday morning.  Pain is mostly diffuse abdomen.  Patient has a known history of ventral abdominal wall hernia.  Patient states when the pain started patient had nausea but did not throw up.  ED Course: In the ER patient had at least 3 episodes of vomiting.  Significant pain and is to be given narcotics for pain relief.  CT abdomen pelvis done shows small bowel obstruction with transition point at the ventral hernia.  On-call general surgeon Dr. Brantley Stage has been consulted and patient placed on NG tube.  Review of Systems: As per HPI, rest all negative.   Past Medical History:  Diagnosis Date  . Arthritis   . CAD (coronary artery disease)    a. Mod prox LAD & first diagonal stenosis by cath 2002 - managed medically since that time with normal LV function.  . Cataract   . Hyperlipidemia   . Hypertension     Past Surgical History:  Procedure Laterality Date  . ABDOMINAL HYSTERECTOMY    . APPENDECTOMY    . BACK SURGERY    . CARPAL TUNNEL RELEASE    . CHOLECYSTECTOMY N/A 06/19/2013   Procedure: LAPAROSCOPIC CHOLECYSTECTOMY;  Surgeon: Harl Bowie, MD;  Location: [place];  Service: General;  Laterality: N/A;  . ESOPHAGOGASTRODUODENOSCOPY N/A 04/26/2017   Procedure: ESOPHAGOGASTRODUODENOSCOPY (EGD) W/ DILATION;  Surgeon: Ronnette Juniper, MD;  Location: [facility];  Service: Gastroenterology;  Laterality: N/A;  . HERNIA REPAIR    . IR GENERIC HISTORICAL   03/18/2016   IR RADIOLOGIST EVAL & MGMT 03/18/2016 Corrie Mckusick, DO [facility] INTERV RAD  . KNEE SURGERY    . SHOULDER SURGERY    . SPLENECTOMY, TOTAL       reports that she has never smoked. She has never used smokeless tobacco. She reports that she does not drink alcohol or use drugs.  Allergies  Allergen Reactions  . Felodipine Anaphylaxis and Other (See Comments)    Reaction: unknown possible nausea or rash  . Prednisone   . Shrimp [Shellfish Allergy] Nausea And Vomiting  . Statins Other (See Comments)    Muscle/leg pain.  Marland Kitchen Omeprazole Other (See Comments)    Reaction: unknown possible nausea or rash    Family History  Problem Relation Age of Onset  . Heart disease Mother        "enlarged valve"  . Cancer Mother        Ovarian  . Stroke Father   . Cancer Sister        Colon    Prior to Admission medications   Medication Sig Start Date End Date Taking? Authorizing Provider  acetaminophen (TYLENOL 8 HOUR ARTHRITIS PAIN) 650 MG CR tablet Take 650 mg by mouth every 8 (eight) hours as needed for pain.   Yes [provider]  ALPRAZolam (XANAX) 0.5 MG tablet Take 0.25-0.5 mg by mouth at bedtime as needed for sleep. Reported on 09/07/2015   Yes [provider]  aspirin EC  81 MG tablet Take 81 mg by mouth daily.   Yes [provider]  hydrochlorothiazide (HYDRODIURIL) 25 MG tablet Take 25 mg by mouth daily.   Yes [provider]  Ondansetron HCl (ZOFRAN PO) Take 1 tablet by mouth daily as needed (nausea).   Yes [provider]  Polyethyl Glycol-Propyl Glycol (SYSTANE OP) Place 1 drop into both eyes 2 (two) times daily.    Yes [provider]  polyethylene glycol powder (GLYCOLAX/MIRALAX) powder Take 17 g by mouth daily as needed (for constipation).   Yes [provider]  diclofenac sodium (VOLTAREN) 1 % GEL Apply 2 g topically 4 (four) times daily. Patient not taking: Reported on 05/18/2018 07/28/17   Garald Balding, MD      Physical Exam: Vitals:   05/18/18 0140 05/18/18 0333 05/18/18 0456 05/18/18 0458  BP: (!) 191/76 (!) 176/86 (!) 168/82   Pulse: 63 61 60   Resp: 20 19 16    Temp:   (!) 97.5 F (36.4 C)   TempSrc:   Oral   SpO2: 100% 96% 96%   Weight:    67.6 kg  Height:    5\' 3"  (1.6 m)      Constitutional: Moderately built and nourished. Vitals:   05/18/18 0140 05/18/18 0333 05/18/18 0456 05/18/18 0458  BP: (!) 191/76 (!) 176/86 (!) 168/82   Pulse: 63 61 60   Resp: 20 19 16    Temp:   (!) 97.5 F (36.4 C)   TempSrc:   Oral   SpO2: 100% 96% 96%   Weight:    67.6 kg  Height:    5\' 3"  (1.6 m)   Eyes: Anicteric no pallor. ENMT: No discharge from the ears eyes nose or mouth. Neck: No mass.  No neck rigidity. Respiratory: No rhonchi or crepitations. Cardiovascular: S1-S2 heard no murmurs appreciated. Abdomen: Distended abdominal hernia felt on exam.  Appears tense.  No rebound tenderness. Musculoskeletal: No edema. Skin: No rash. Neurologic: Alert awake oriented to time place and person.  Moves all extremities. Psychiatric: Appears normal.  Normal affect.   Labs on Admission: I have personally reviewed following labs and imaging studies  CBC: Recent Labs  Lab 05/18/18 0002  WBC 7.8  NEUTROABS 6.2  HGB 15.4*  HCT 46.2*  MCV 90.8  PLT 536   Basic Metabolic Panel: Recent Labs  Lab 05/18/18 0002  NA 138  K 4.1  CL 99  CO2 24  GLUCOSE 186*  BUN 27*  CREATININE 0.92  CALCIUM 10.1   GFR: Estimated Creatinine Clearance: 39.8 mL/min (by C-G formula based on SCr of 0.92 mg/dL). Liver Function Tests: Recent Labs  Lab 05/18/18 0002  AST 27  ALT 18  ALKPHOS 68  BILITOT 0.7  PROT 6.4*  ALBUMIN 3.8   Recent Labs  Lab 05/18/18 0002  LIPASE 104*   No results for input(s): AMMONIA in the last 168 hours. Coagulation Profile: No results for input(s): INR, PROTIME in the last 168 hours. Cardiac Enzymes: No results for input(s): CKTOTAL, CKMB, CKMBINDEX, TROPONINI in  the last 168 hours. BNP (last 3 results) No results for input(s): PROBNP in the last 8760 hours. HbA1C: No results for input(s): HGBA1C in the last 72 hours. CBG: No results for input(s): GLUCAP in the last 168 hours. Lipid Profile: No results for input(s): CHOL, HDL, LDLCALC, TRIG, CHOLHDL, LDLDIRECT in the last 72 hours. Thyroid Function Tests: No results for input(s): TSH, T4TOTAL, FREET4, T3FREE, THYROIDAB in the last 72 hours. Anemia Panel: No  results for input(s): VITAMINB12, FOLATE, FERRITIN, TIBC, IRON, RETICCTPCT in the last 72 hours. Urine analysis:    Component Value Date/Time   COLORURINE AMBER (A) 08/02/2013 1342   APPEARANCEUR CLOUDY (A) 08/02/2013 1342   LABSPEC 1.024 08/02/2013 1342   PHURINE 5.0 08/02/2013 1342   GLUCOSEU NEGATIVE 08/02/2013 1342   HGBUR NEGATIVE 08/02/2013 1342   BILIRUBINUR SMALL (A) 08/02/2013 1342   KETONESUR 15 (A) 08/02/2013 1342   PROTEINUR 30 (A) 08/02/2013 1342   UROBILINOGEN 0.2 08/02/2013 1342   NITRITE NEGATIVE 08/02/2013 1342   LEUKOCYTESUR MODERATE (A) 08/02/2013 1342   Sepsis Labs: @LABRCNTIP (procalcitonin:4,lacticidven:4) )No results found for this or any previous visit (from the past 240 hour(s)).   Radiological Exams on Admission: Dg Abdomen 1 View  Result Date: 05/18/2018 CLINICAL DATA:  84 y/o  F; nasogastric tube placement. EXAM: ABDOMEN - 1 VIEW COMPARISON:  05/18/2018 CT abdomen and pelvis. FINDINGS: Dilated loops of small bowel in the mid abdomen. Enteric tube is coiling over the lower chest within a large hiatal hernia. Right upper quadrant cholecystectomy clips. Moderate lumbar spine dextrocurvature. IMPRESSION: Enteric tube is coiled over the lower chest within a large hiatal hernia. Dilated loops of small bowel in the mid abdomen. Electronically Signed   By: Kristine Garbe M.D.   On: 05/18/2018 03:43   Ct Renal Stone Study  Result Date: 05/18/2018 CLINICAL DATA:  Abdominal pain and vomiting. EXAM: CT  ABDOMEN AND PELVIS WITHOUT CONTRAST TECHNIQUE: Multidetector CT imaging of the abdomen and pelvis was performed following the standard protocol without IV contrast. COMPARISON:  Most recent comparison CT 10/18/2017. CT 05/02/2016 also reviewed FINDINGS: Lower chest: No pleural fluid or consolidation. Moderate-sized hiatal hernia, similar to prior exam. There are mitral annulus calcifications. Hepatobiliary: No focal hepatic lesion on noncontrast exam. Clips in the gallbladder fossa postcholecystectomy. No biliary dilatation. Pancreas: No ductal dilatation or inflammation. No CT findings of pancreatitis. Spleen: Prior splenectomy. Small soft tissue deposit in the left upper quadrant consistent with splenosis. Additional soft tissue deposits adjacent to the hiatal hernia also likely splenosis. Adrenals/Urinary Tract: No adrenal nodule. No hydronephrosis or perinephric edema. Bilateral cortical low-density lesions in both kidneys, similar to prior exam and likely cysts. No urolithiasis. Urinary bladder is physiologically distended without wall thickening. Stomach/Bowel: Lack of enteric contrast limits bowel evaluation. Moderate hiatal hernia, fluid within the intrathoracic portion of the stomach. Complex upper ventral abdominal wall hernia containing multiple loops of small bowel. Small bowel loops within the hernia and proximally are dilated and fluid-filled with mesenteric stranding and small amount of free fluid. Transition point suspected in the hernia with exiting small bowel decompressed, for example image 37 series 2 and image 72 series 6. More distal small bowel loops are decompressed. Moderate colonic stool burden. Questionable additional hernia of the right lateral lower abdominal wall involving the cecum, image 58 series 2, with bowel extending superficial to the peritoneal reflection, but no inflammatory changes or obstruction at this site. This is proximal to the prior right inguinal hernia repair. Sigmoid  colonic tortuosity. Colonic diverticulosis of the distal colon without diverticulitis. Vascular/Lymphatic: Aorto bi-iliac atherosclerosis. Limited assessment for adenopathy given lack contrast. Reproductive: Status post hysterectomy. No adnexal masses. Other: Ventral abdominal wall hernia containing dilated inflamed small bowel with stranding of the herniated fat and small amount of free fluid. Mesenteric edema involving upper abdominal small bowel. No free air or perforation. Lobular soft tissue density in the midline anterior upper abdomen anterior to the left lobe of the liver measuring 1.7  x 4.3 x 4.6 cm. Prior right and left inguinal hernia repair. Musculoskeletal: Postsurgical and degenerative change in the spine. There are no acute or suspicious osseous abnormalities. IMPRESSION: 1. Findings consistent with small bowel obstruction with transition point in complex upper ventral abdominal wall hernia. 2. Suspect additional abdominal wall hernia containing cecum in the right lower lateral anterior abdominal wall, proximal to site of prior inguinal hernia repair. No obstruction or inflammatory changes related to this hernia. 3. Lobular soft tissue density in the central upper abdomen anterior to the left lobe of the liver. Only mild increase in size since 2017. Differential considerations include splenosis given prior splenectomy, lymph nodes or peritoneal carcinomatosis. Benign etiology is favored given lack of significant progression over the course of 2 years. 4. Chronic and incidental findings include moderate to large hiatal hernia, colonic diverticulosis, and Aortic Atherosclerosis (ICD10-I70.0). Electronically Signed   By: Keith Rake M.D.   On: 05/18/2018 02:27      Assessment/Plan Principal Problem:   SBO (small bowel obstruction) (HCC) Active Problems:   Hypertension    1. Small bowel obstruction -with transition point seen involving the ventral hernia.  Patient kept n.p.o. in  anticipation of possible procedure and on NG tube.  Will follow general surgery consult.  Repeat x-rays in the morning.  Pain relief medications IV fluids. 2. Hypertension -since patient is n.p.o. I have placed patient on PRN IV hydralazine. 3. History of CAD managed medically.  Denies any chest pain.   DVT prophylaxis: SCDs in anticipation of possible procedure. Code Status: DNR. Family Communication: Discussed with patient. Disposition Plan: Home. Consults called: General surgery. Admission status: Inpatient.   Rise Patience MD [facility] Pager [phone].  If 7PM-7AM, please contact night-coverage [email or web address] Password [email or web address]  05/18/2018, 5:08 AM

## 2018-05-18 NOTE — Consult Note (Signed)
Reason for Consult: Small bowel obstruction/ventral hernia Referring Physician: Hal Hope MD  [name] is an 84 y.o. female.  HPI: Asked to see patient at the request of Dr.kAKRAKANDY for abdominal pain.  The patient is an 84 year old female with a known history of multiple incisional hernia.  Her stomach developed significant severe pain diffusely yesterday.  She came the emergency room is found to have what appeared to be pancreatitis and multiple ventral hernia and small bowel obstruction.  She complains of nausea, vomiting, diffuse abdominal pain which is severe especially over the hernia.  Attempts were made to reduce the hernia by the EDP and this caused significant pain as well.  She has no fever or chills.  Her white count is normal.  She lives by herself and has multiple medical problems.  Her pain is severe and 8 out of 10 location is the anterior abdomen over the hernia area.  Past Medical History:  Diagnosis Date  . Arthritis   . CAD (coronary artery disease)    a. Mod prox LAD & first diagonal stenosis by cath 2002 - managed medically since that time with normal LV function.  . Cataract   . Hyperlipidemia   . Hypertension     Past Surgical History:  Procedure Laterality Date  . ABDOMINAL HYSTERECTOMY    . APPENDECTOMY    . BACK SURGERY    . CARPAL TUNNEL RELEASE    . CHOLECYSTECTOMY N/A 06/19/2013   Procedure: LAPAROSCOPIC CHOLECYSTECTOMY;  Surgeon: Harl Bowie, MD;  Location: [facility];  Service: General;  Laterality: N/A;  . ESOPHAGOGASTRODUODENOSCOPY N/A 04/26/2017   Procedure: ESOPHAGOGASTRODUODENOSCOPY (EGD) W/ DILATION;  Surgeon: Ronnette Juniper, MD;  Location: [facility];  Service: Gastroenterology;  Laterality: N/A;  . HERNIA REPAIR    . IR GENERIC HISTORICAL  03/18/2016   IR RADIOLOGIST EVAL & MGMT 03/18/2016 Corrie Mckusick, DO [facility] INTERV RAD  . KNEE SURGERY    . SHOULDER SURGERY    . SPLENECTOMY, TOTAL      Family History  Problem Relation Age of Onset  .  Heart disease Mother        "enlarged valve"  . Cancer Mother        Ovarian  . Stroke Father   . Cancer Sister        Colon    Social History:  reports that she has never smoked. She has never used smokeless tobacco. She reports that she does not drink alcohol or use drugs.  Allergies:  Allergies  Allergen Reactions  . Felodipine Anaphylaxis and Other (See Comments)    Reaction: unknown possible nausea or rash  . Prednisone   . Shrimp [Shellfish Allergy] Nausea And Vomiting  . Statins Other (See Comments)    Muscle/leg pain.  Marland Kitchen Omeprazole Other (See Comments)    Reaction: unknown possible nausea or rash    Medications: I have reviewed the patient's current medications.  Results for orders placed or performed during the hospital encounter of 05/17/18 (from the past 48 hour(s))  CBC with Differential/Platelet     Status: Abnormal   Collection Time: 05/18/18 12:02 AM  Result Value Ref Range   WBC 7.8 4.0 - 10.5 K/uL   RBC 5.09 3.87 - 5.11 MIL/uL   Hemoglobin 15.4 (H) 12.0 - 15.0 g/dL   HCT 46.2 (H) 36.0 - 46.0 %   MCV 90.8 78.0 - 100.0 fL   MCH 30.3 26.0 - 34.0 pg   MCHC 33.3 30.0 - 36.0 g/dL   RDW  16.1 (H) 11.5 - 15.5 %   Platelets 227 150 - 400 K/uL   Neutrophils Relative % 80 %   Neutro Abs 6.2 1.7 - 7.7 K/uL   Lymphocytes Relative 16 %   Lymphs Abs 1.2 0.7 - 4.0 K/uL   Monocytes Relative 4 %   Monocytes Absolute 0.3 0.1 - 1.0 K/uL   Eosinophils Relative 0 %   Eosinophils Absolute 0.0 0.0 - 0.7 K/uL   Basophils Relative 0 %   Basophils Absolute 0.0 0.0 - 0.1 K/uL    Comment: Performed at [facility], [place] [address], [place], [place] [address]  Comprehensive metabolic panel     Status: Abnormal   Collection Time: 05/18/18 12:02 AM  Result Value Ref Range   Sodium 138 135 - 145 mmol/L   Potassium 4.1 3.5 - 5.1 mmol/L   Chloride 99 98 - 111 mmol/L   CO2 24 22 - 32 mmol/L   Glucose, Bld 186 (H) 70 - 99 mg/dL   BUN 27 (H) 8 - 23 mg/dL    Creatinine, Ser 0.92 0.44 - 1.00 mg/dL   Calcium 10.1 8.9 - 10.3 mg/dL   Total Protein 6.4 (L) 6.5 - 8.1 g/dL   Albumin 3.8 3.5 - 5.0 g/dL   AST 27 15 - 41 U/L   ALT 18 0 - 44 U/L   Alkaline Phosphatase 68 38 - 126 U/L   Total Bilirubin 0.7 0.3 - 1.2 mg/dL   GFR calc non Af Amer 54 (L) >60 mL/min   GFR calc Af Amer >60 >60 mL/min    Comment: (NOTE) The eGFR has been calculated using the CKD EPI equation. This calculation has not been validated in all clinical situations. eGFR's persistently <60 mL/min signify possible Chronic Kidney Disease.    Anion gap 15 5 - 15    Comment: Performed at [facility], [place] [address] [place], [place] [address]  Lipase, blood     Status: Abnormal   Collection Time: 05/18/18 12:02 AM  Result Value Ref Range   Lipase 104 (H) 11 - 51 U/L    Comment: Performed at [facility], [place] [address] [place], [place] [address]    Dg Abdomen 1 View  Result Date: 05/18/2018 CLINICAL DATA:  84 y/o  F; nasogastric tube placement. EXAM: ABDOMEN - 1 VIEW COMPARISON:  05/18/2018 CT abdomen and pelvis. FINDINGS: Dilated loops of small bowel in the mid abdomen. Enteric tube is coiling over the lower chest within a large hiatal hernia. Right upper quadrant cholecystectomy clips. Moderate lumbar spine dextrocurvature. IMPRESSION: Enteric tube is coiled over the lower chest within a large hiatal hernia. Dilated loops of small bowel in the mid abdomen. Electronically Signed   By: Kristine Garbe M.D.   On: 05/18/2018 03:43   Ct Renal Stone Study  Result Date: 05/18/2018 CLINICAL DATA:  Abdominal pain and vomiting. EXAM: CT ABDOMEN AND PELVIS WITHOUT CONTRAST TECHNIQUE: Multidetector CT imaging of the abdomen and pelvis was performed following the standard protocol without IV contrast. COMPARISON:  Most recent comparison CT 10/18/2017. CT 05/02/2016 also reviewed FINDINGS: Lower chest: No pleural fluid or consolidation.  Moderate-sized hiatal hernia, similar to prior exam. There are mitral annulus calcifications. Hepatobiliary: No focal hepatic lesion on noncontrast exam. Clips in the gallbladder fossa postcholecystectomy. No biliary dilatation. Pancreas: No ductal dilatation or inflammation. No CT findings of pancreatitis. Spleen: Prior splenectomy. Small soft tissue deposit in the left upper quadrant consistent with splenosis. Additional soft tissue deposits  adjacent to the hiatal hernia also likely splenosis. Adrenals/Urinary Tract: No adrenal nodule. No hydronephrosis or perinephric edema. Bilateral cortical low-density lesions in both kidneys, similar to prior exam and likely cysts. No urolithiasis. Urinary bladder is physiologically distended without wall thickening. Stomach/Bowel: Lack of enteric contrast limits bowel evaluation. Moderate hiatal hernia, fluid within the intrathoracic portion of the stomach. Complex upper ventral abdominal wall hernia containing multiple loops of small bowel. Small bowel loops within the hernia and proximally are dilated and fluid-filled with mesenteric stranding and small amount of free fluid. Transition point suspected in the hernia with exiting small bowel decompressed, for example image 37 series 2 and image 72 series 6. More distal small bowel loops are decompressed. Moderate colonic stool burden. Questionable additional hernia of the right lateral lower abdominal wall involving the cecum, image 58 series 2, with bowel extending superficial to the peritoneal reflection, but no inflammatory changes or obstruction at this site. This is proximal to the prior right inguinal hernia repair. Sigmoid colonic tortuosity. Colonic diverticulosis of the distal colon without diverticulitis. Vascular/Lymphatic: Aorto bi-iliac atherosclerosis. Limited assessment for adenopathy given lack contrast. Reproductive: Status post hysterectomy. No adnexal masses. Other: Ventral abdominal wall hernia containing  dilated inflamed small bowel with stranding of the herniated fat and small amount of free fluid. Mesenteric edema involving upper abdominal small bowel. No free air or perforation. Lobular soft tissue density in the midline anterior upper abdomen anterior to the left lobe of the liver measuring 1.7 x 4.3 x 4.6 cm. Prior right and left inguinal hernia repair. Musculoskeletal: Postsurgical and degenerative change in the spine. There are no acute or suspicious osseous abnormalities. IMPRESSION: 1. Findings consistent with small bowel obstruction with transition point in complex upper ventral abdominal wall hernia. 2. Suspect additional abdominal wall hernia containing cecum in the right lower lateral anterior abdominal wall, proximal to site of prior inguinal hernia repair. No obstruction or inflammatory changes related to this hernia. 3. Lobular soft tissue density in the central upper abdomen anterior to the left lobe of the liver. Only mild increase in size since 2017. Differential considerations include splenosis given prior splenectomy, lymph nodes or peritoneal carcinomatosis. Benign etiology is favored given lack of significant progression over the course of 2 years. 4. Chronic and incidental findings include moderate to large hiatal hernia, colonic diverticulosis, and Aortic Atherosclerosis (ICD10-I70.0). Electronically Signed   By: Keith Rake M.D.   On: 05/18/2018 02:27    Review of Systems  Constitutional: Positive for malaise/fatigue.  HENT: Negative.   Eyes: Negative.   Respiratory: Negative.   Cardiovascular: Negative.   Gastrointestinal: Positive for abdominal pain, nausea and vomiting.  Genitourinary: Negative.   Musculoskeletal: Positive for joint pain.  Skin: Negative.   Neurological: Negative.   Endo/Heme/Allergies: Negative for environmental allergies. Does not bruise/bleed easily.  Psychiatric/Behavioral: Negative.    Blood pressure (!) 186/67, pulse 64, temperature 98.6 F  (37 C), temperature source Oral, resp. rate 16, height _0  (1.6 m), weight 67.6 kg, SpO2 94 %. Physical Exam  Constitutional: She is oriented to person, place, and time. She appears well-developed. She appears distressed.  HENT:  Head: Normocephalic.  Eyes: Pupils are equal, round, and reactive to light.  Neck: Normal range of motion.  Cardiovascular: Normal rate and regular rhythm.  Respiratory: Effort normal and breath sounds normal.  GI: There is tenderness in the periumbilical area. There is guarding. There is no rigidity and no rebound.    Musculoskeletal: Normal range of motion.  Neurological: She  is alert and oriented to person, place, and time.  Skin: Skin is warm and dry.  Psychiatric: She has a normal mood and affect. Her behavior is normal.    Assessment/Plan: Incarcerated incisional hernia with small bowel obstruction-NG tube in place and getting IV fluid resuscitation.  She will more than likely require surgical repair of this given her small bowel obstruction and severe pain.  I will discuss with Dr. Excell Seltzer he will see her today to determine timing of this.  I would keep the patient n.p.o. for now.  Discussed with patient.  Thomas A Cornett 05/18/2018, 6:26 AM

## 2018-05-18 NOTE — Progress Notes (Addendum)
    CC: Abdominal pain  Subjective: Patient has an NG in place although itsin the stomach in the upper chest, it has  is drained  about 400 - 500 cc are currently.  She has a large non reduced hernia, but seems fairly comfortable despite this.  Her son recently had a Sigmoid colon surgery.  I told him this would not be laparoscopic.   Objective: Vital signs in last 24 hours: Temp:  [97.4 F (36.3 C)-98.6 F (37 C)] 98.6 F (37 C) (10/03 0545) Pulse Rate:  [55-64] 64 (10/03 0545) Resp:  [16-20] 16 (10/03 0545) BP: (160-191)/(67-86) 186/67 (10/03 0545) SpO2:  [94 %-100 %] 94 % (10/03 0545) Weight:  [67.6 kg] 67.6 kg (10/03 0458) Last BM Date: 05/17/18 366 IV fluid recorded intake Nothing else recorded. Afebrile but hypertensive blood pressure ranging from 993 systolic to 716R systolic Glucose 678 BUN 25 WBC is up to 11.7 H/H elevated 15.6/46.7. Admission CT renal study: Consistent with small bowel obstruction with transition point in complex upper ventral abdominal wall hernia.  Additional abdominal wall hernia containing cecum in the right lower lateral abdominal wall proximal to the site of prior inguinal hernia.  No obstruction or inflammatory changes.  Moderate to large hiatal hernia, prior splenectomy with soft tissue pauses consistent with splenosis  Plain film shows the NG tube coiled in the lower chest within the large hiatal hernia    Intake/Output from previous day: 10/02 0701 - 10/03 0700 In: 366.8 [I.V.:366.8] Out: -  Intake/Output this shift: No intake/output data recorded.  General appearance: alert, cooperative and no distress Resp: up to bedside commode. GI: soft, but palpable hernia remains present mid abdomen.  she is up to the bedside commode, so I don't really feel the other site noted on CT.  she is fairly comfortable with the NG in place.  Lab Results:  Recent Labs    05/18/18 0002 05/18/18 0632  WBC 7.8 11.7*  HGB 15.4* 15.6*  HCT 46.2* 46.7*   PLT 227 234    BMET Recent Labs    05/18/18 0002 05/18/18 0632  NA 138 137  K 4.1 3.8  CL 99 98  CO2 24 26  GLUCOSE 186* 170*  BUN 27* 25*  CREATININE 0.92 0.82  CALCIUM 10.1 9.7   PT/INR No results for input(s): LABPROT, INR in the last 72 hours.  Recent Labs  Lab 05/18/18 0002 05/18/18 0632  AST 27 26  ALT 18 20  ALKPHOS 68 69  BILITOT 0.7 0.6  PROT 6.4* 6.6  ALBUMIN 3.8 3.8     Lipase     Component Value Date/Time   LIPASE 104 (H) 05/18/2018 0002   . sodium chloride 125 mL/hr at 05/18/18 0306     Medications:   Assessment/Plan History of coronary artery disease Hypertension GERD Hyperlipidemia Hypomagnesemia   Incarcerated incisional hernia with small bowel obstruction S/P abdominal hysterectomy, cholecystectomy, incarcerated hernia repair 2008, splenectomy.  FEN: IV fluids/n.p.o. ID: None DVT: SCDs Follow-up: To be determined    Plan:  She needs medical clearance for anesthesia/surgery.  I think we will need to do this during her hospitalization, perhaps tomorrow.  Continue NPO/NG and IV fluids.  I have ordered labs, CXR, and EKG, will await Medicine evaluation for surgery.  Replace Mag, recheck tomorrow.     LOS: 0 days    JENNINGS,WILLARD 05/18/2018 [phone]

## 2018-05-18 NOTE — Progress Notes (Signed)
Patient is an 84 year old female, with past medical history significant for coronary artery disease, hypertension and hyperlipidemia.  Patient has severe hiatal hernia as well as ventral abdominal wall hernia.  Patient was admitted with small bowel obstruction with transition point in complaints or prior ventral abdominal wall hernia.  Surgical input is appreciated.  For possible surgery in a.m.  Currently, the patient is comfortable.  NG tube tube to low suction.  We will pursue EKG, echocardiogram, and will replete magnesium.  We will continue to optimize patient for possible surgery tomorrow.  Further management depend on hospital course.

## 2018-05-18 NOTE — ED Notes (Signed)
ED TO INPATIENT HANDOFF REPORT  Name/Age/Gender [name] 84 y.o. female  Code Status Code Status History    Date Active Date Inactive Code Status Order ID Comments User Context   08/02/2013 1607 08/03/2013 1725 DNR [id number]  Donne Hazel, MD ED   08/02/2013 1600 08/02/2013 1607 Full Code [id number]  Donne Hazel, MD ED   06/17/2013 0203 06/21/2013 1951 Full Code [id number]  Donnie Mesa, MD Inpatient      Home/SNF/Other Nursing Home  Chief Complaint Abdominal Pain  Level of Care/Admitting Diagnosis ED Disposition    ED Disposition Condition [place]: [facility] [[id number]]  Level of Care: Med-Surg [16]  Diagnosis: SBO (small bowel obstruction) [facility]) [[id number]]  Admitting Physician: Rise Patience [phone]  Attending Physician: Rise Patience [phone]  Estimated length of stay: past midnight tomorrow  Certification:: I certify this patient will need inpatient services for at least 2 midnights  PT Class (Do Not Modify): Inpatient [101]  PT Acc Code (Do Not Modify): Private [1]       Medical History Past Medical History:  Diagnosis Date  . Arthritis   . CAD (coronary artery disease)    a. Mod prox LAD & first diagonal stenosis by cath 2002 - managed medically since that time with normal LV function.  . Cataract   . Hyperlipidemia   . Hypertension     Allergies Allergies  Allergen Reactions  . Felodipine Anaphylaxis and Other (See Comments)    Reaction: unknown possible nausea or rash  . Prednisone   . Shrimp [Shellfish Allergy] Nausea And Vomiting  . Statins Other (See Comments)    Muscle/leg pain.  Marland Kitchen Omeprazole Other (See Comments)    Reaction: unknown possible nausea or rash    IV Location/Drains/Wounds Patient Lines/Drains/Airways Status   Active Line/Drains/Airways    Name:   Placement date:   Placement time:   Site:   Days:   Peripheral IV 05/17/18 Left Forearm   05/17/18    2354    Forearm   1    NG/OG Tube Nasogastric 14 Fr. Right nare Aucultation Measured external length of tube   05/18/18    0258    Right nare   less than 1   Incision 06/19/13 Abdomen Other (Comment)   06/19/13    1221     1794   Incision - 4 Ports Abdomen 1: Umbilicus 2: Mid;Upper 3: Right;Upper 4: Right;Lower   06/19/13    1142     1794          Labs/Imaging Results for orders placed or performed during the hospital encounter of 05/17/18 (from the past 48 hour(s))  CBC with Differential/Platelet     Status: Abnormal   Collection Time: 05/18/18 12:02 AM  Result Value Ref Range   WBC 7.8 4.0 - 10.5 K/uL   RBC 5.09 3.87 - 5.11 MIL/uL   Hemoglobin 15.4 (H) 12.0 - 15.0 g/dL   HCT 46.2 (H) 36.0 - 46.0 %   MCV 90.8 78.0 - 100.0 fL   MCH 30.3 26.0 - 34.0 pg   MCHC 33.3 30.0 - 36.0 g/dL   RDW 16.1 (H) 11.5 - 15.5 %   Platelets 227 150 - 400 K/uL   Neutrophils Relative % 80 %   Neutro Abs 6.2 1.7 - 7.7 K/uL   Lymphocytes Relative 16 %   Lymphs Abs 1.2 0.7 - 4.0 K/uL   Monocytes Relative 4 %   Monocytes Absolute  0.3 0.1 - 1.0 K/uL   Eosinophils Relative 0 %   Eosinophils Absolute 0.0 0.0 - 0.7 K/uL   Basophils Relative 0 %   Basophils Absolute 0.0 0.0 - 0.1 K/uL    Comment: Performed at [facility], [place] [address], [place], [place] [address]  Comprehensive metabolic panel     Status: Abnormal   Collection Time: 05/18/18 12:02 AM  Result Value Ref Range   Sodium 138 135 - 145 mmol/L   Potassium 4.1 3.5 - 5.1 mmol/L   Chloride 99 98 - 111 mmol/L   CO2 24 22 - 32 mmol/L   Glucose, Bld 186 (H) 70 - 99 mg/dL   BUN 27 (H) 8 - 23 mg/dL   Creatinine, Ser 0.92 0.44 - 1.00 mg/dL   Calcium 10.1 8.9 - 10.3 mg/dL   Total Protein 6.4 (L) 6.5 - 8.1 g/dL   Albumin 3.8 3.5 - 5.0 g/dL   AST 27 15 - 41 U/L   ALT 18 0 - 44 U/L   Alkaline Phosphatase 68 38 - 126 U/L   Total Bilirubin 0.7 0.3 - 1.2 mg/dL   GFR calc non Af Amer 54 (L) >60 mL/min   GFR calc Af Amer >60 >60 mL/min    Comment:  (NOTE) The eGFR has been calculated using the CKD EPI equation. This calculation has not been validated in all clinical situations. eGFR's persistently <60 mL/min signify possible Chronic Kidney Disease.    Anion gap 15 5 - 15    Comment: Performed at [facility], [place] [address] [place], [place] [address]  Lipase, blood     Status: Abnormal   Collection Time: 05/18/18 12:02 AM  Result Value Ref Range   Lipase 104 (H) 11 - 51 U/L    Comment: Performed at [facility], [place] [address] [place], [place] [address]   Ct Renal Stone Study  Result Date: 05/18/2018 CLINICAL DATA:  Abdominal pain and vomiting. EXAM: CT ABDOMEN AND PELVIS WITHOUT CONTRAST TECHNIQUE: Multidetector CT imaging of the abdomen and pelvis was performed following the standard protocol without IV contrast. COMPARISON:  Most recent comparison CT 10/18/2017. CT 05/02/2016 also reviewed FINDINGS: Lower chest: No pleural fluid or consolidation. Moderate-sized hiatal hernia, similar to prior exam. There are mitral annulus calcifications. Hepatobiliary: No focal hepatic lesion on noncontrast exam. Clips in the gallbladder fossa postcholecystectomy. No biliary dilatation. Pancreas: No ductal dilatation or inflammation. No CT findings of pancreatitis. Spleen: Prior splenectomy. Small soft tissue deposit in the left upper quadrant consistent with splenosis. Additional soft tissue deposits adjacent to the hiatal hernia also likely splenosis. Adrenals/Urinary Tract: No adrenal nodule. No hydronephrosis or perinephric edema. Bilateral cortical low-density lesions in both kidneys, similar to prior exam and likely cysts. No urolithiasis. Urinary bladder is physiologically distended without wall thickening. Stomach/Bowel: Lack of enteric contrast limits bowel evaluation. Moderate hiatal hernia, fluid within the intrathoracic portion of the stomach. Complex upper ventral abdominal wall hernia containing multiple  loops of small bowel. Small bowel loops within the hernia and proximally are dilated and fluid-filled with mesenteric stranding and small amount of free fluid. Transition point suspected in the hernia with exiting small bowel decompressed, for example image 37 series 2 and image 72 series 6. More distal small bowel loops are decompressed. Moderate colonic stool burden. Questionable additional hernia of the right lateral lower abdominal wall involving the cecum, image 58 series 2, with bowel extending superficial to the peritoneal reflection, but no inflammatory changes or  obstruction at this site. This is proximal to the prior right inguinal hernia repair. Sigmoid colonic tortuosity. Colonic diverticulosis of the distal colon without diverticulitis. Vascular/Lymphatic: Aorto bi-iliac atherosclerosis. Limited assessment for adenopathy given lack contrast. Reproductive: Status post hysterectomy. No adnexal masses. Other: Ventral abdominal wall hernia containing dilated inflamed small bowel with stranding of the herniated fat and small amount of free fluid. Mesenteric edema involving upper abdominal small bowel. No free air or perforation. Lobular soft tissue density in the midline anterior upper abdomen anterior to the left lobe of the liver measuring 1.7 x 4.3 x 4.6 cm. Prior right and left inguinal hernia repair. Musculoskeletal: Postsurgical and degenerative change in the spine. There are no acute or suspicious osseous abnormalities. IMPRESSION: 1. Findings consistent with small bowel obstruction with transition point in complex upper ventral abdominal wall hernia. 2. Suspect additional abdominal wall hernia containing cecum in the right lower lateral anterior abdominal wall, proximal to site of prior inguinal hernia repair. No obstruction or inflammatory changes related to this hernia. 3. Lobular soft tissue density in the central upper abdomen anterior to the left lobe of the liver. Only mild increase in size  since 2017. Differential considerations include splenosis given prior splenectomy, lymph nodes or peritoneal carcinomatosis. Benign etiology is favored given lack of significant progression over the course of 2 years. 4. Chronic and incidental findings include moderate to large hiatal hernia, colonic diverticulosis, and Aortic Atherosclerosis (ICD10-I70.0). Electronically Signed   By: Keith Rake M.D.   On: 05/18/2018 02:27    Pending Labs Unresulted Labs (From admission, onward)    Start     Ordered   05/17/18 2341  Urinalysis, Routine w reflex microscopic  Once,   R     05/17/18 2340          Vitals/Pain Today's Vitals   05/18/18 0000 05/18/18 0140 05/18/18 0227 05/18/18 0306  BP:  (!) 191/76    Pulse:  63    Resp:  20    Temp:      TempSrc:      SpO2:  100%    PainSc: 10-Worst pain ever  10-Worst pain ever 10-Worst pain ever    Isolation Precautions No active isolations  Medications Medications  fentaNYL (SUBLIMAZE) injection 50 mcg (50 mcg Intravenous Given 05/18/18 0055)  0.9 %  sodium chloride infusion ( Intravenous New Bag/Given 05/18/18 0306)  0.9 %  sodium chloride infusion (has no administration in time range)  fentaNYL (SUBLIMAZE) injection 50 mcg (50 mcg Intravenous Given 05/18/18 0123)  ondansetron (ZOFRAN) injection 4 mg (4 mg Intravenous Given 05/18/18 0122)  fentaNYL (SUBLIMAZE) injection 100 mcg (100 mcg Intravenous Given 05/18/18 0245)  lidocaine (XYLOCAINE) 2 % jelly (11 application  Given 24/0/97 0259)    Mobility walks with device

## 2018-05-18 NOTE — ED Provider Notes (Signed)
[facility] DEPT Provider Note   CSN: [id number] Arrival date & time: 05/17/18  2330     History   Chief Complaint Chief Complaint  Patient presents with  . Abdominal Pain    HPI [name] is a 84 y.o. female.  The history is provided by the patient.  Abdominal Pain   This is a new problem. The current episode started 6 to 12 hours ago. The problem occurs constantly. The problem has not changed since onset.The pain is associated with an unknown factor. The pain is located in the periumbilical region. The quality of the pain is sharp. The pain is at a severity of 10/10. The pain is severe. Associated symptoms include nausea and vomiting. Pertinent negatives include diarrhea and dysuria. Nothing aggravates the symptoms. Nothing relieves the symptoms. Past workup includes surgery. Her past medical history does not include PUD.    Past Medical History:  Diagnosis Date  . Arthritis   . CAD (coronary artery disease)    a. Mod prox LAD & first diagonal stenosis by cath 2002 - managed medically since that time with normal LV function.  . Cataract   . Hyperlipidemia   . Hypertension     Patient Active Problem List   Diagnosis Date Noted  . Bilateral primary osteoarthritis of knee 07/22/2017  . Hyperlipidemia 05/20/2014  . Hypotension 08/02/2013  . UTI (lower urinary tract infection) 08/02/2013  . Acute renal failure ([facility]) 08/02/2013  . ARF (acute renal failure) ([facility]) 08/02/2013  . Calculus of gallbladder with acute cholecystitis, without mention of obstruction 07/10/2013  . Acute cholecystitis 06/17/2013  . Chest pain 06/17/2013  . Pulmonary infiltrate in left lung on chest x-ray 06/17/2013  . Hypertension   . CAD (coronary artery disease)     Past Surgical History:  Procedure Laterality Date  . ABDOMINAL HYSTERECTOMY    . APPENDECTOMY    . BACK SURGERY    . CARPAL TUNNEL RELEASE    . CHOLECYSTECTOMY N/A 06/19/2013   Procedure:  LAPAROSCOPIC CHOLECYSTECTOMY;  Surgeon: Harl Bowie, MD;  Location: [facility];  Service: General;  Laterality: N/A;  . ESOPHAGOGASTRODUODENOSCOPY N/A 04/26/2017   Procedure: ESOPHAGOGASTRODUODENOSCOPY (EGD) W/ DILATION;  Surgeon: Ronnette Juniper, MD;  Location: [facility];  Service: Gastroenterology;  Laterality: N/A;  . HERNIA REPAIR    . IR GENERIC HISTORICAL  03/18/2016   IR RADIOLOGIST EVAL & MGMT 03/18/2016 Corrie Mckusick, DO [facility] INTERV RAD  . KNEE SURGERY    . SHOULDER SURGERY    . SPLENECTOMY, TOTAL       OB History   None      Home Medications    Prior to Admission medications   Medication Sig Start Date End Date Taking? Authorizing Provider  acetaminophen (TYLENOL 8 HOUR ARTHRITIS PAIN) 650 MG CR tablet Take 650 mg by mouth every 8 (eight) hours as needed for pain.    [provider]  ALPRAZolam Duanne Moron) 0.5 MG tablet Take 0.25-0.5 mg by mouth at bedtime as needed for sleep. Reported on 09/07/2015    [provider]  aspirin EC 81 MG tablet Take 81 mg by mouth daily.    [provider]  diclofenac sodium (VOLTAREN) 1 % GEL Apply 2 g topically 4 (four) times daily. 07/28/17   Garald Balding, MD  hydrochlorothiazide (HYDRODIURIL) 25 MG tablet Take 25 mg by mouth daily.    [provider]  Ondansetron HCl (ZOFRAN PO) Take 1 tablet by mouth daily as needed (nausea).  [provider]  Polyethyl Glycol-Propyl Glycol (SYSTANE OP) Place 1 drop into both eyes 2 (two) times daily.     [provider]  polyethylene glycol powder (GLYCOLAX/MIRALAX) powder Take 17 g by mouth daily as needed (for constipation).    [provider]    Family History Family History  Problem Relation Age of Onset  . Heart disease Mother        "enlarged valve"  . Cancer Mother        Ovarian  . Stroke Father   . Cancer Sister        Colon    Social History Social History   Tobacco Use  . Smoking status: Never Smoker  . Smokeless  tobacco: Never Used  Substance Use Topics  . Alcohol use: No  . Drug use: No     Allergies   Felodipine; Prednisone; Shrimp [shellfish allergy]; Statins; and Omeprazole   Review of Systems Review of Systems  Eyes: Negative for photophobia.  Respiratory: Negative for shortness of breath.   Gastrointestinal: Positive for abdominal pain, nausea and vomiting. Negative for diarrhea.  Genitourinary: Negative for dysuria.  All other systems reviewed and are negative.    Physical Exam Updated Vital Signs BP (!) 191/76 (BP Location: Left Arm)   Pulse 63   Temp (!) 97.4 F (36.3 C) (Oral)   Resp 20   SpO2 100%   Physical Exam  Constitutional: She is oriented to person, place, and time. She appears well-developed and well-nourished. No distress.  HENT:  Head: Normocephalic and atraumatic.  Mouth/Throat: No oropharyngeal exudate.  Eyes: Pupils are equal, round, and reactive to light. Conjunctivae are normal.  Neck: Normal range of motion. Neck supple.  Cardiovascular: Normal rate, regular rhythm, normal heart sounds and intact distal pulses.  Pulmonary/Chest: Effort normal and breath sounds normal. No stridor. She has no wheezes. She has no rales.  Abdominal: Soft. Bowel sounds are normal. She exhibits no mass. There is no tenderness. There is no rebound and no guarding.  Musculoskeletal: Normal range of motion.  Neurological: She is alert and oriented to person, place, and time. She displays normal reflexes.  Skin: Skin is warm and dry. Capillary refill takes less than 2 seconds.  Psychiatric: She has a normal mood and affect.     ED Treatments / Results  Labs (all labs ordered are listed, but only abnormal results are displayed) Labs Reviewed  CBC WITH DIFFERENTIAL/PLATELET - Abnormal; Notable for the following components:      Result Value   Hemoglobin 15.4 (*)    HCT 46.2 (*)    RDW 16.1 (*)    All other components within normal limits  COMPREHENSIVE METABOLIC PANEL -  Abnormal; Notable for the following components:   Glucose, Bld 186 (*)    BUN 27 (*)    Total Protein 6.4 (*)    GFR calc non Af Amer 54 (*)    All other components within normal limits  LIPASE, BLOOD - Abnormal; Notable for the following components:   Lipase 104 (*)    All other components within normal limits  URINALYSIS, ROUTINE W REFLEX MICROSCOPIC    EKG None  Radiology Ct Renal Stone Study  Result Date: 05/18/2018 CLINICAL DATA:  Abdominal pain and vomiting. EXAM: CT ABDOMEN AND PELVIS WITHOUT CONTRAST TECHNIQUE: Multidetector CT imaging of the abdomen and pelvis was performed following the standard protocol without IV contrast. COMPARISON:  Most recent comparison CT 10/18/2017. CT 05/02/2016 also reviewed FINDINGS: Lower chest: No  pleural fluid or consolidation. Moderate-sized hiatal hernia, similar to prior exam. There are mitral annulus calcifications. Hepatobiliary: No focal hepatic lesion on noncontrast exam. Clips in the gallbladder fossa postcholecystectomy. No biliary dilatation. Pancreas: No ductal dilatation or inflammation. No CT findings of pancreatitis. Spleen: Prior splenectomy. Small soft tissue deposit in the left upper quadrant consistent with splenosis. Additional soft tissue deposits adjacent to the hiatal hernia also likely splenosis. Adrenals/Urinary Tract: No adrenal nodule. No hydronephrosis or perinephric edema. Bilateral cortical low-density lesions in both kidneys, similar to prior exam and likely cysts. No urolithiasis. Urinary bladder is physiologically distended without wall thickening. Stomach/Bowel: Lack of enteric contrast limits bowel evaluation. Moderate hiatal hernia, fluid within the intrathoracic portion of the stomach. Complex upper ventral abdominal wall hernia containing multiple loops of small bowel. Small bowel loops within the hernia and proximally are dilated and fluid-filled with mesenteric stranding and small amount of free fluid. Transition point  suspected in the hernia with exiting small bowel decompressed, for example image 37 series 2 and image 72 series 6. More distal small bowel loops are decompressed. Moderate colonic stool burden. Questionable additional hernia of the right lateral lower abdominal wall involving the cecum, image 58 series 2, with bowel extending superficial to the peritoneal reflection, but no inflammatory changes or obstruction at this site. This is proximal to the prior right inguinal hernia repair. Sigmoid colonic tortuosity. Colonic diverticulosis of the distal colon without diverticulitis. Vascular/Lymphatic: Aorto bi-iliac atherosclerosis. Limited assessment for adenopathy given lack contrast. Reproductive: Status post hysterectomy. No adnexal masses. Other: Ventral abdominal wall hernia containing dilated inflamed small bowel with stranding of the herniated fat and small amount of free fluid. Mesenteric edema involving upper abdominal small bowel. No free air or perforation. Lobular soft tissue density in the midline anterior upper abdomen anterior to the left lobe of the liver measuring 1.7 x 4.3 x 4.6 cm. Prior right and left inguinal hernia repair. Musculoskeletal: Postsurgical and degenerative change in the spine. There are no acute or suspicious osseous abnormalities. IMPRESSION: 1. Findings consistent with small bowel obstruction with transition point in complex upper ventral abdominal wall hernia. 2. Suspect additional abdominal wall hernia containing cecum in the right lower lateral anterior abdominal wall, proximal to site of prior inguinal hernia repair. No obstruction or inflammatory changes related to this hernia. 3. Lobular soft tissue density in the central upper abdomen anterior to the left lobe of the liver. Only mild increase in size since 2017. Differential considerations include splenosis given prior splenectomy, lymph nodes or peritoneal carcinomatosis. Benign etiology is favored given lack of significant  progression over the course of 2 years. 4. Chronic and incidental findings include moderate to large hiatal hernia, colonic diverticulosis, and Aortic Atherosclerosis (ICD10-I70.0). Electronically Signed   By: Keith Rake M.D.   On: 05/18/2018 02:27    Procedures Procedures (including critical care time)  Medications Ordered in ED Medications  fentaNYL (SUBLIMAZE) injection 50 mcg (50 mcg Intravenous Given 05/18/18 0055)  0.9 %  sodium chloride infusion (has no administration in time range)  fentaNYL (SUBLIMAZE) injection 50 mcg (50 mcg Intravenous Given 05/18/18 0123)  ondansetron (ZOFRAN) injection 4 mg (4 mg Intravenous Given 05/18/18 0122)  fentaNYL (SUBLIMAZE) injection 100 mcg (100 mcg Intravenous Given 05/18/18 0245)  lidocaine (XYLOCAINE) 2 % jelly (11 application  Given 55/7/32 0259)     NGT placed.    Final Clinical Impressions(s) / ED Diagnoses   Final diagnoses:  Acute pancreatitis, unspecified complication status, unspecified pancreatitis type  Intestinal obstruction,  unspecified cause, unspecified whether partial or complete [facility])   Case d/w Dr. Brantley Stage who will see the patient in consult admitted to the hospitalist     Palumbo, April, MD 05/18/18 [id number]

## 2018-05-18 NOTE — ED Notes (Signed)
HOSPITALIST  at bedside. 

## 2018-05-19 ENCOUNTER — Inpatient Hospital Stay (HOSPITAL_COMMUNITY): Payer: Medicare Other

## 2018-05-19 ENCOUNTER — Inpatient Hospital Stay (HOSPITAL_COMMUNITY): Payer: Medicare Other | Admitting: Anesthesiology

## 2018-05-19 ENCOUNTER — Encounter (HOSPITAL_COMMUNITY)
Admission: EM | Disposition: A | Discharge status: Skilled Nursing Facility | Payer: Self-pay | Source: Home / Self Care | Attending: Internal Medicine

## 2018-05-19 ENCOUNTER — Encounter (HOSPITAL_COMMUNITY): Payer: Self-pay | Admitting: Certified Registered"

## 2018-05-19 DIAGNOSIS — K430 Incisional hernia with obstruction, without gangrene: Secondary | ICD-10-CM

## 2018-05-19 DIAGNOSIS — I361 Nonrheumatic tricuspid (valve) insufficiency: Secondary | ICD-10-CM

## 2018-05-19 HISTORY — PX: INSERTION OF MESH: SHX5868

## 2018-05-19 HISTORY — PX: VENTRAL HERNIA REPAIR: SHX424

## 2018-05-19 LAB — BASIC METABOLIC PANEL
Anion gap: 9 (ref 5–15)
BUN: 19 mg/dL (ref 8–23)
CO2: 26 mmol/L (ref 22–32)
Calcium: 8.9 mg/dL (ref 8.9–10.3)
Chloride: 104 mmol/L (ref 98–111)
Creatinine, Ser: 0.83 mg/dL (ref 0.44–1.00)
GFR calc Af Amer: >60 mL/min (ref >60)
GFR calc non Af Amer: >60 mL/min (ref >60)
Glucose, Bld: 111 mg/dL — ABNORMAL HIGH (ref 70–99)
Potassium: 3.2 mmol/L — ABNORMAL LOW (ref 3.5–5.1)
Sodium: 139 mmol/L (ref 135–145)

## 2018-05-19 LAB — PROTIME-INR
INR: 0.96
Prothrombin Time: 12.6 seconds (ref 11.4–15.2)

## 2018-05-19 LAB — CBC WITH DIFFERENTIAL/PLATELET
Basophils Absolute: 0 10*3/uL (ref 0.0–0.1)
Basophils Relative: 0 %
Eosinophils Absolute: 0.1 10*3/uL (ref 0.0–0.7)
Eosinophils Relative: 1 %
HCT: 43.3 % (ref 36.0–46.0)
Hemoglobin: 14.2 g/dL (ref 12.0–15.0)
Lymphocytes Relative: 18 %
Lymphs Abs: 1.4 10*3/uL (ref 0.7–4.0)
MCH: 29.9 pg (ref 26.0–34.0)
MCHC: 32.8 g/dL (ref 30.0–36.0)
MCV: 91.2 fL (ref 78.0–100.0)
Monocytes Absolute: 0.9 10*3/uL (ref 0.1–1.0)
Monocytes Relative: 11 %
Neutro Abs: 5.7 10*3/uL (ref 1.7–7.7)
Neutrophils Relative %: 70 %
Platelets: 215 10*3/uL (ref 150–400)
RBC: 4.75 MIL/uL (ref 3.87–5.11)
RDW: 16.5 % — ABNORMAL HIGH (ref 11.5–15.5)
WBC: 8.1 10*3/uL (ref 4.0–10.5)

## 2018-05-19 LAB — RENAL FUNCTION PANEL
Albumin: 2.9 g/dL — ABNORMAL LOW (ref 3.5–5.0)
Anion gap: 9 (ref 5–15)
BUN: 20 mg/dL (ref 8–23)
CO2: 27 mmol/L (ref 22–32)
Calcium: 8.9 mg/dL (ref 8.9–10.3)
Chloride: 104 mmol/L (ref 98–111)
Creatinine, Ser: 0.87 mg/dL (ref 0.44–1.00)
GFR calc Af Amer: >60 mL/min (ref >60)
GFR calc non Af Amer: 58 mL/min — ABNORMAL LOW (ref >60)
Glucose, Bld: 111 mg/dL — ABNORMAL HIGH (ref 70–99)
Phosphorus: 2.4 mg/dL — ABNORMAL LOW (ref 2.5–4.6)
Potassium: 3.2 mmol/L — ABNORMAL LOW (ref 3.5–5.1)
Sodium: 140 mmol/L (ref 135–145)

## 2018-05-19 LAB — ECHOCARDIOGRAM COMPLETE
Height: 63 in
Weight: 2384 oz

## 2018-05-19 LAB — MRSA PCR SCREENING: MRSA by PCR: NEGATIVE

## 2018-05-19 LAB — MAGNESIUM: Magnesium: 2.2 mg/dL (ref 1.7–2.4)

## 2018-05-19 LAB — PREALBUMIN: Prealbumin: 13.2 mg/dL — ABNORMAL LOW (ref 18–38)

## 2018-05-19 SURGERY — REPAIR, HERNIA, VENTRAL
Anesthesia: General | Site: Abdomen

## 2018-05-19 MED ORDER — EPHEDRINE 5 MG/ML INJ
INTRAVENOUS | Status: AC
  Filled 2018-05-19: qty 10

## 2018-05-19 MED ORDER — ROCURONIUM BROMIDE 10 MG/ML (PF) SYRINGE
PREFILLED_SYRINGE | INTRAVENOUS | Status: AC
  Filled 2018-05-19: qty 10

## 2018-05-19 MED ORDER — FENTANYL CITRATE (PF) 100 MCG/2ML IJ SOLN
INTRAMUSCULAR | Status: AC
  Filled 2018-05-19: qty 2

## 2018-05-19 MED ORDER — ACETAMINOPHEN 325 MG PO TABS
650.0000 mg | ORAL_TABLET | Freq: Four times a day (QID) | ORAL | Status: DC
  Administered 2018-05-19 – 2018-05-25 (×22): 650 mg via ORAL
  Filled 2018-05-19 (×22): qty 2

## 2018-05-19 MED ORDER — BUPIVACAINE LIPOSOME 1.3 % IJ SUSP
20.0000 mL | Freq: Once | INTRAMUSCULAR | Status: AC
  Administered 2018-05-19: 20 mL
  Filled 2018-05-19: qty 20

## 2018-05-19 MED ORDER — SUCCINYLCHOLINE CHLORIDE 200 MG/10ML IV SOSY
PREFILLED_SYRINGE | INTRAVENOUS | Status: AC
  Filled 2018-05-19: qty 10

## 2018-05-19 MED ORDER — ONDANSETRON HCL 4 MG/2ML IJ SOLN
INTRAMUSCULAR | Status: AC
  Filled 2018-05-19: qty 2

## 2018-05-19 MED ORDER — SUGAMMADEX SODIUM 500 MG/5ML IV SOLN
INTRAVENOUS | Status: DC | PRN
  Administered 2018-05-19: 120 mg via INTRAVENOUS

## 2018-05-19 MED ORDER — ONDANSETRON HCL 4 MG/2ML IJ SOLN: 4.0000 mg | Freq: Once | INTRAMUSCULAR | Status: DC | PRN

## 2018-05-19 MED ORDER — ROCURONIUM BROMIDE 10 MG/ML (PF) SYRINGE
PREFILLED_SYRINGE | INTRAVENOUS | Status: DC | PRN
  Administered 2018-05-19: 10 mg via INTRAVENOUS
  Administered 2018-05-19: 30 mg via INTRAVENOUS
  Administered 2018-05-19: 10 mg via INTRAVENOUS

## 2018-05-19 MED ORDER — LIP MEDEX EX OINT
TOPICAL_OINTMENT | CUTANEOUS | Status: AC
  Administered 2018-05-19: 1
  Filled 2018-05-19: qty 7

## 2018-05-19 MED ORDER — FENTANYL CITRATE (PF) 100 MCG/2ML IJ SOLN
25.0000 ug | INTRAMUSCULAR | Status: DC | PRN
  Administered 2018-05-19 (×2): 50 ug via INTRAVENOUS

## 2018-05-19 MED ORDER — OXYCODONE HCL 5 MG PO TABS
5.0000 mg | ORAL_TABLET | ORAL | Status: DC | PRN
  Administered 2018-05-19 – 2018-05-23 (×9): 5 mg via ORAL
  Filled 2018-05-19 (×9): qty 1

## 2018-05-19 MED ORDER — SUGAMMADEX SODIUM 200 MG/2ML IV SOLN
INTRAVENOUS | Status: AC
  Filled 2018-05-19: qty 2

## 2018-05-19 MED ORDER — POTASSIUM CHLORIDE 10 MEQ/100ML IV SOLN
10.0000 meq | INTRAVENOUS | Status: AC
  Administered 2018-05-19 (×4): 10 meq via INTRAVENOUS
  Filled 2018-05-19 (×4): qty 100

## 2018-05-19 MED ORDER — LIDOCAINE 2% (20 MG/ML) 5 ML SYRINGE
INTRAMUSCULAR | Status: AC
  Filled 2018-05-19: qty 5

## 2018-05-19 MED ORDER — MORPHINE SULFATE (PF) 2 MG/ML IV SOLN: 2.0000 mg | INTRAVENOUS | Status: DC | PRN

## 2018-05-19 MED ORDER — ONDANSETRON HCL 4 MG/2ML IJ SOLN
INTRAMUSCULAR | Status: DC | PRN
  Administered 2018-05-19: 4 mg via INTRAVENOUS

## 2018-05-19 MED ORDER — DEXAMETHASONE SODIUM PHOSPHATE 10 MG/ML IJ SOLN
INTRAMUSCULAR | Status: AC
  Filled 2018-05-19: qty 1

## 2018-05-19 MED ORDER — ENOXAPARIN SODIUM 40 MG/0.4ML ~~LOC~~ SOLN
40.0000 mg | SUBCUTANEOUS | Status: DC
  Administered 2018-05-20 – 2018-05-25 (×6): 40 mg via SUBCUTANEOUS
  Filled 2018-05-19 (×6): qty 0.4

## 2018-05-19 MED ORDER — 0.9 % SODIUM CHLORIDE (POUR BTL) OPTIME
TOPICAL | Status: DC | PRN
  Administered 2018-05-19: 1000 mL

## 2018-05-19 MED ORDER — PROPOFOL 10 MG/ML IV BOLUS
INTRAVENOUS | Status: AC
  Filled 2018-05-19: qty 20

## 2018-05-19 MED ORDER — SUCCINYLCHOLINE CHLORIDE 200 MG/10ML IV SOSY
PREFILLED_SYRINGE | INTRAVENOUS | Status: DC | PRN
  Administered 2018-05-19: 100 mg via INTRAVENOUS

## 2018-05-19 MED ORDER — SUGAMMADEX SODIUM 200 MG/2ML IV SOLN
INTRAVENOUS | Status: DC | PRN
  Administered 2018-05-19: 120 mg via INTRAVENOUS

## 2018-05-19 MED ORDER — BUPIVACAINE-EPINEPHRINE (PF) 0.5% -1:200000 IJ SOLN
INTRAMUSCULAR | Status: AC
  Filled 2018-05-19: qty 30

## 2018-05-19 MED ORDER — BUPIVACAINE-EPINEPHRINE (PF) 0.5% -1:200000 IJ SOLN
INTRAMUSCULAR | Status: DC | PRN
  Administered 2018-05-19: 30 mL via PERINEURAL

## 2018-05-19 MED ORDER — EPHEDRINE SULFATE-NACL 50-0.9 MG/10ML-% IV SOSY
PREFILLED_SYRINGE | INTRAVENOUS | Status: DC | PRN
  Administered 2018-05-19 (×2): 10 mg via INTRAVENOUS

## 2018-05-19 MED ORDER — PROPOFOL 10 MG/ML IV BOLUS
INTRAVENOUS | Status: DC | PRN
  Administered 2018-05-19: 20 mg via INTRAVENOUS
  Administered 2018-05-19: 140 mg via INTRAVENOUS

## 2018-05-19 MED ORDER — LIDOCAINE 2% (20 MG/ML) 5 ML SYRINGE
INTRAMUSCULAR | Status: DC | PRN
  Administered 2018-05-19: 80 mg via INTRAVENOUS

## 2018-05-19 MED ORDER — FENTANYL CITRATE (PF) 100 MCG/2ML IJ SOLN
INTRAMUSCULAR | Status: DC | PRN
  Administered 2018-05-19: 25 ug via INTRAVENOUS
  Administered 2018-05-19 (×2): 50 ug via INTRAVENOUS
  Administered 2018-05-19: 25 ug via INTRAVENOUS
  Administered 2018-05-19: 50 ug via INTRAVENOUS

## 2018-05-19 SURGICAL SUPPLY — 57 items
BINDER ABDOMINAL 12 ML 46-62 (SOFTGOODS) ×3 IMPLANT
BIOPATCH WHT 1IN DISK W/4.0 H (GAUZE/BANDAGES/DRESSINGS) IMPLANT
BLADE EXTENDED COATED 6.5IN (ELECTRODE) IMPLANT
COVER SURGICAL LIGHT HANDLE (MISCELLANEOUS) ×3 IMPLANT
DECANTER SPIKE VIAL GLASS SM (MISCELLANEOUS) IMPLANT
DERMABOND ADVANCED (GAUZE/BANDAGES/DRESSINGS) ×1
DERMABOND ADVANCED .7 DNX12 (GAUZE/BANDAGES/DRESSINGS) ×2 IMPLANT
DEVICE TROCAR PUNCTURE CLOSURE (ENDOMECHANICALS) IMPLANT
DRAIN CHANNEL 19F RND (DRAIN) ×6 IMPLANT
DRAPE INCISE IOBAN 66X45 STRL (DRAPES) IMPLANT
DRAPE LAPAROSCOPIC ABDOMINAL (DRAPES) ×3 IMPLANT
DRSG OPSITE POSTOP 4X10 (GAUZE/BANDAGES/DRESSINGS) ×3 IMPLANT
DRSG OPSITE POSTOP 4X8 (GAUZE/BANDAGES/DRESSINGS) IMPLANT
DRSG TEGADERM 4X4.75 (GAUZE/BANDAGES/DRESSINGS) ×6 IMPLANT
ELECT REM PT RETURN 15FT ADLT (MISCELLANEOUS) ×3 IMPLANT
EVACUATOR SILICONE 100CC (DRAIN) ×6 IMPLANT
GAUZE SPONGE 4X4 12PLY STRL (GAUZE/BANDAGES/DRESSINGS) IMPLANT
GLOVE BIOGEL PI IND STRL 7.0 (GLOVE) ×2 IMPLANT
GLOVE BIOGEL PI IND STRL 7.5 (GLOVE) ×2 IMPLANT
GLOVE BIOGEL PI INDICATOR 7.0 (GLOVE) ×1
GLOVE BIOGEL PI INDICATOR 7.5 (GLOVE) ×1
GLOVE ECLIPSE 7.5 STRL STRAW (GLOVE) ×3 IMPLANT
GOWN STRL REUS W/TWL LRG LVL3 (GOWN DISPOSABLE) ×3 IMPLANT
GOWN STRL REUS W/TWL XL LVL3 (GOWN DISPOSABLE) ×6 IMPLANT
HOLDER FOLEY CATH W/STRAP (MISCELLANEOUS) ×3 IMPLANT
KIT BASIN OR (CUSTOM PROCEDURE TRAY) ×3 IMPLANT
MESH HERNIA 6X6 BARD (Mesh General) ×2 IMPLANT
MESH HERNIA BARD 6X6 (Mesh General) ×1 IMPLANT
NEEDLE HYPO 22GX1.5 SAFETY (NEEDLE) IMPLANT
NS IRRIG 1000ML POUR BTL (IV SOLUTION) ×3 IMPLANT
PACK GENERAL/GYN (CUSTOM PROCEDURE TRAY) ×3 IMPLANT
SPONGE DRAIN TRACH 4X4 STRL 2S (GAUZE/BANDAGES/DRESSINGS) ×6 IMPLANT
STAPLER VISISTAT 35W (STAPLE) ×3 IMPLANT
SUT ETHIBOND 2 0 SH (SUTURE)
SUT ETHIBOND 2 0 SH 36X2 (SUTURE) IMPLANT
SUT ETHILON 2 0 PS N (SUTURE) ×6 IMPLANT
SUT NOVA NAB GS-21 0 18 T12 DT (SUTURE) ×6 IMPLANT
SUT NOVA NAB GS-21 1 T12 (SUTURE) ×9 IMPLANT
SUT PDS AB 1 CTX 36 (SUTURE) IMPLANT
SUT PDS AB 2-0 CT2 27 (SUTURE) IMPLANT
SUT PROLENE 0 CT 1 30 (SUTURE) IMPLANT
SUT PROLENE 0 CT 1 CR/8 (SUTURE) IMPLANT
SUT SILK 2 0 (SUTURE)
SUT SILK 2 0 SH CR/8 (SUTURE) IMPLANT
SUT SILK 2-0 18XBRD TIE 12 (SUTURE) IMPLANT
SUT SILK 3 0 (SUTURE)
SUT SILK 3-0 18XBRD TIE 12 (SUTURE) IMPLANT
SUT VIC AB 0 CT1 36 (SUTURE) ×3 IMPLANT
SUT VIC AB 3-0 54XBRD REEL (SUTURE) ×2 IMPLANT
SUT VIC AB 3-0 BRD 54 (SUTURE) ×1
SUT VIC AB 3-0 SH 27 (SUTURE) ×2
SUT VIC AB 3-0 SH 27XBRD (SUTURE) ×4 IMPLANT
SUT VICRYL 0 UR6 27IN ABS (SUTURE) ×3 IMPLANT
SYR 20CC LL (SYRINGE) IMPLANT
TOWEL OR 17X26 10 PK STRL BLUE (TOWEL DISPOSABLE) ×3 IMPLANT
TRAY FOLEY CATH 14FRSI W/METER (CATHETERS) ×3 IMPLANT
TRAY FOLEY MTR SLVR 16FR STAT (SET/KITS/TRAYS/PACK) IMPLANT

## 2018-05-19 NOTE — Progress Notes (Signed)
Patient complaints of throat pain. NG tube placement checked and was not in place. NG tube removed. Patient refusing to have NG replaced since she is going for surgery today.

## 2018-05-19 NOTE — Plan of Care (Signed)
Pt stable with no needs at time of assessment. Pt medicated for pain. No changes needed to current care plans. Family called rn  and rn updated family on pt current condition. Will continue to monitor.

## 2018-05-19 NOTE — Progress Notes (Signed)
Day of Surgery    CC:  Subjective: Hernia reduced yesterday.  Sitting up and comfortable this AM, Washing up and getting ready for surgery.  Echo just completed.    Objective: Vital signs in last 24 hours: Temp:  [98.8 F (37.1 C)-99 F (37.2 C)] 98.9 F (37.2 C) (10/04 0160) Pulse Rate:  [55-58] 55 (10/04 0613) Resp:  [17] 17 (10/04 0613) BP: (144-165)/(58-77) 165/62 (10/04 0613) SpO2:  [94 %-96 %] 94 % (10/04 0613) Last BM Date: 05/17/18 180 PO 1800 IV Urine x 4 No BM Afebrile, VSS K+ 3.2, mag 2.2 Phos 2.4 Prealbumin 13.2 Other labs OK CXR:  Large hiatal hernia.  Subsegmental atelectasis LEFT base.   Intake/Output from previous day: 10/03 0701 - 10/04 0700 In: 1985.3 [P.O.:180; I.V.:1800; IV Piggyback:5.4] Out: -  Intake/Output this shift: No intake/output data recorded.  General appearance: alert, cooperative, no distress and Up in chair washing, so I did not examine her.    Lab Results:  Recent Labs    05/18/18 0632 05/19/18 0503  WBC 11.7* 8.1  HGB 15.6* 14.2  HCT 46.7* 43.3  PLT 234 215    BMET Recent Labs    05/18/18 0632 05/19/18 0503  NA 137 140  139  K 3.8 3.2*  3.2*  CL 98 104  104  CO2 26 27  26   GLUCOSE 170* 111*  111*  BUN 25* 20  19  CREATININE 0.82 0.87  0.83  CALCIUM 9.7 8.9  8.9   PT/INR Recent Labs    05/19/18 0503  LABPROT 12.6  INR 0.96    Recent Labs  Lab 05/18/18 0002 05/18/18 0632 05/19/18 0503  AST 27 26  --   ALT 18 20  --   ALKPHOS 68 69  --   BILITOT 0.7 0.6  --   PROT 6.4* 6.6  --   ALBUMIN 3.8 3.8 2.9*     Lipase     Component Value Date/Time   LIPASE 104 (H) 05/18/2018 0002   . sodium chloride 75 mL/hr at 05/18/18 1930  . cefoTEtan (CEFOTAN) IV    . potassium chloride 10 mEq (05/19/18 0904)     Medications:   Assessment/Plan History of coronary artery disease Hypertension GERD Hyperlipidemia Hypomagnesemia   Incarcerated incisional hernia with small bowel obstruction S/P  abdominal hysterectomy, cholecystectomy, incarcerated hernia repair 2008, splenectomy.  FEN: IV fluids/n.p.o. ID: None DVT: SCDs Follow-up: To be determined   Plan:  Medicine working on Medical clearance.  She is scheduled for later this AM.  LOS: 1 day    JENNINGS,WILLARD 05/19/2018 [phone]

## 2018-05-19 NOTE — Anesthesia Preprocedure Evaluation (Addendum)
Anesthesia Evaluation  Patient identified by MRN, date of birth, ID band Patient awake    Reviewed: Allergy & Precautions, NPO status , Patient's Chart, lab work & pertinent test results  Airway Mallampati: III  TM Distance: >3 FB Neck ROM: Full    Dental  (+) Edentulous Upper, Edentulous Lower   Pulmonary neg pulmonary ROS,    Pulmonary exam normal breath sounds clear to auscultation       Cardiovascular hypertension, Pt. on medications + CAD  Normal cardiovascular exam Rhythm:Regular Rate:Normal  ECG: Sinus bradycardia, rate 58 with sinus arrhythmia Possible Right ventricular hypertrophy  ECHO: Left ventricle: The cavity size was normal. Wall thickness was normal. Systolic function was normal. The estimated ejection fraction was in the range of 60% to 65%. Wall motion was normal; there were no regional wall motion abnormalities. Doppler parameters are consistent with abnormal left ventricular relaxation (grade 1 diastolic dysfunction). The E/e&' ratio is between 8-15, suggesting indeterminate LV filling pressure. Aortic valve: Trileaflet. No significant stenosis with immobility of the non-coronary cusp. Mean gradient (S): 9 mm Hg. Peak gradient (S): 16 mm Hg. Valve area (VTI): 2.63 cm^2. Valve area (Vmax): 2.44 cm^2. Valve area (Vmean): 2.58 cm^2. Mitral valve: Calcified annulus. Mildly thickened leaflets . There was trivial regurgitation. Left atrium: Moderately dilated. Right atrium: Moderately dilated. Tricuspid valve: There was mild regurgitation. Pulmonary arteries: PA peak pressure: 35 mm Hg (S). Inferior vena cava: The vessel was normal in size. The respirophasic diameter changes were in the normal range (>= 50%), consistent with normal central venous pressure.  Sees cardiologist Tamala Julian)   Neuro/Psych negative neurological ROS  negative psych ROS   GI/Hepatic Neg liver ROS, SBO   Endo/Other  negative endocrine  ROS  Renal/GU negative Renal ROS     Musculoskeletal negative musculoskeletal ROS (+)   Abdominal   Peds  Hematology HLD   Anesthesia Other Findings incarcerated ventral hernia Ambulates with walker  Reproductive/Obstetrics                            Anesthesia Physical Anesthesia Plan  ASA: III  Anesthesia Plan: General   Post-op Pain Management:    Induction: Intravenous  PONV Risk Score and Plan: 3 and Ondansetron, Treatment may vary due to age or medical condition and Dexamethasone  Airway Management Planned: Oral ETT  Additional Equipment:   Intra-op Plan:   Post-operative Plan: Extubation in OR  Informed Consent: I have reviewed the patients History and Physical, chart, labs and discussed the procedure including the risks, benefits and alternatives for the proposed anesthesia with the patient or authorized representative who has indicated his/her understanding and acceptance.   Dental advisory given  Plan Discussed with: CRNA  Anesthesia Plan Comments:         Anesthesia Quick Evaluation

## 2018-05-19 NOTE — Anesthesia Postprocedure Evaluation (Signed)
Anesthesia Post Note  Patient: [name]  Procedure(s) Performed: HERNIA REPAIR VENTRAL ADULT (N/A Abdomen) INSERTION OF MESH (Abdomen)     Patient location during evaluation: PACU Anesthesia Type: General Level of consciousness: awake and alert Pain management: pain level controlled Vital Signs Assessment: post-procedure vital signs reviewed and stable Respiratory status: spontaneous breathing, nonlabored ventilation, respiratory function stable and patient connected to nasal cannula oxygen Cardiovascular status: blood pressure returned to baseline and stable Postop Assessment: no apparent nausea or vomiting Anesthetic complications: no    Last Vitals:  Vitals:   05/19/18 1615 05/19/18 1625  BP: (!) 142/63 (!) 156/67  Pulse: 68 67  Resp: 14 16  Temp: 37.1 C 36.6 C  SpO2: 97% 98%    Last Pain:  Vitals:   05/19/18 1625  TempSrc: Oral  PainSc: Asleep                 Ryan P Ellender

## 2018-05-19 NOTE — Op Note (Signed)
Preoperative Diagnosis: Ventral incisional hernia  Postoprative Diagnosis: Same  Procedure: Retrorectus repair ventral incisional hernia with mesh   Surgeon: Excell Seltzer T   Assistants: Jackson Latino  Anesthesia:  General endotracheal anesthesia  Indications: Patient is an 84 year old female living independently in relatively good health with previous abdominal surgery including a periumbilical hernia repair in the past.  She has had a known recurrent mid abdominal incisional hernia for a number of years gradually enlarging.  In recent months she has been having intermittent pain and presented this hospitalization with incarceration and small bowel obstruction.  This reduced with conservative management.  Due to worsening symptoms and episodes of incarceration after discussion with the patient and family we have elected to proceed with repair of her hernia.  We discussed the procedure and indications and risks detailed elsewhere and she is in agreement.    Procedure Detail: Patient was brought to the operating room, placed in supine position on the operating table, and general endotracheal anesthesia induced.  Foley catheter was placed.  She received preoperative IV antibiotics.  PAS were in place.  The abdomen was widely sterilely prepped and draped.  Patient timeout was performed and correct procedure verified.  The hernia was palpable at and just above the umbilicus.  I made a line incision skirting the umbilicus and dissection was carried down through the subtenons tissue.  A large hernia sac was encountered and was dissected away completely from surrounding subcutaneous tissue down to the level of the fascia.  The hernia sac was opened and contained some incarcerated omentum.  Omental adhesions were taken down off the hernia sac and reduced.  The hernia sac was excised back to the level of the fascia.  At this point there were no significant intra-abdominal adhesions other than some deep  pelvic omental adhesions that were left intact.  The defect itself measured about 5 to 6 cm in diameter.  I elected to repair this with a retrorectus technique.  The fascia was incised a short distance superiorly and inferiorly into healthy fascia.  On each side the peritoneum was incised and posterior rectus sheath incised and the rectus muscle exposed.  The retrorectus plane was developed on either side out to the perforating vessels.  Following this the peritoneum was dissected off the midline superiorly and inferiorly for an additional 5 to 6 cm.  The peritoneum and rectus sheath was then closed with running 0 Vicryl.  The retrorectus space at this point measured 15 x 9 cm.  A piece of Prolene mesh was trimmed to the size.  It was then secured in the retrorectus space with interrupted 0 Novafil brought through the abdominal wall and 6 locations with the Endo Close device.  This provided nice broad deployment of the mesh behind the rectus and anterior fascia.  A closed suction drain was left in the space and brought out on the patient's left side.  Soft tissue was infiltrated with Marcaine and Exparel.  The midline fascia was closed using running #1 Novafil.  A second 19 Blake drain on the patient's right side in the subcutaneous space.    Findings: As above  Estimated Blood Loss:  Minimal         Drains: 19 Blake drains in retrorectus and subcutaneous spaces  Blood Given: none          Specimens: None        Complications:  * No complications entered in OR log *  Disposition: PACU - hemodynamically stable.         Condition: stable

## 2018-05-19 NOTE — Progress Notes (Signed)
PROGRESS NOTE    [name]  OZY:[id number] DOB: 1929-01-20 DOA: 05/17/2018 PCP: Lajean Manes, MD  Outpatient Specialists:   Brief Narrative:  Patient is an 84 year old female, with past medical history significant for coronary artery disease, hypertension and hyperlipidemia.  Patient has severe hiatal hernia as well as ventral abdominal wall hernia.  Patient was admitted with small bowel obstruction with transition point in complex upper ventral abdominal wall hernia.  Surgical input is appreciated.  For possible surgery as per surgical team.  EKG reveals possible LVH.  Echocardiogram is pending.  Patient reported being fairly active prior to admission.  No limitations in activity, and no associated chest pain or shortness of breath with activity.  Prior history of coronary artery disease documented, but patient could not remember details.  Hypokalemia is noted.  Magnesium has been repleted.  Magnesium today is 2.2.  Patient actually looks better today.  NG tube to low suction continues to suction gastric contents.  However, patient reported having broken wind.  Last bowel movement was prior to admission.  Surgical input is highly appreciated.  Assessment & Plan:   Principal Problem:   SBO (small bowel obstruction) (HCC) Active Problems:   Hypertension   Incarcerated incisional hernia - reduced   Small bowel obstruction: Transition point seen involving the ventral hernia. Continue n.p.o. Continue NG tube to low suction Repeat abdominal KUB Optimize electrolytes Follow echocardiogram No limitation with activity prior to admission Medically optimized. Adequate pain control postop Further management will depend on hospital course.  Hypokalemia: Replete (IV KCl 10 M EQ every hour x4 doses) Continue to monitor potassium and magnesium level.  Hypomagnesemia: Repleted.  Hypertension: Continue to optimize.  History of CAD: Asymptomatic.  No limitation with activity Patient  can not remember details.   DVT prophylaxis: SCDs in anticipation of possible procedure. Code Status: DNR. Family Communication: Discussed with patient. Disposition Plan: Home. Consults called: General surgery.  Procedures:   Echo pending  Possible surgery today.  Antimicrobials:   None   Subjective: No new complaints. No chest pain or shortness of breath. No fever or chills. No nausea or vomiting.  Objective: Vitals:   05/18/18 0545 05/18/18 1308 05/18/18 2219 05/19/18 0613  BP: (!) 186/67 (!) 144/58 (!) 146/77 (!) 165/62  Pulse: 64 (!) 58 (!) 57 (!) 55  Resp: 16  17 17   Temp: 98.6 F (37 C) 98.8 F (37.1 C) 99 F (37.2 C) 98.9 F (37.2 C)  TempSrc: Oral Oral Oral Oral  SpO2: 94% 96% 96% 94%  Weight:      Height:        Intake/Output Summary (Last 24 hours) at 05/19/2018 0852 Last data filed at 05/19/2018 0600 Gross per 24 hour  Intake 1985.33 ml  Output -  Net 1985.33 ml   Filed Weights   05/18/18 0458  Weight: 67.6 kg    Examination:  General exam: Appears calm and comfortable.  NG to suction. Respiratory system: Clear to auscultation.  Cardiovascular system: S1 & S2. No pedal edema. Gastrointestinal system: Abdomen is nondistended, soft and nontender. No organomegaly or masses felt. Normal bowel sounds heard. Central nervous system: Alert and oriented. No focal neurological deficits. Extremities: No leg edema.    Data Reviewed: I have personally reviewed following labs and imaging studies  CBC: Recent Labs  Lab 05/18/18 0002 05/18/18 0632 05/19/18 0503  WBC 7.8 11.7* 8.1  NEUTROABS 6.2 10.2* 5.7  HGB 15.4* 15.6* 14.2  HCT 46.2* 46.7* 43.3  MCV 90.8 91.0 91.2  PLT 227 234 381   Basic Metabolic Panel: Recent Labs  Lab 05/18/18 0002 05/18/18 0632 05/19/18 0503  NA 138 137 140  139  K 4.1 3.8 3.2*  3.2*  CL 99 98 104  104  CO2 24 26 27  26   GLUCOSE 186* 170* 111*  111*  BUN 27* 25* 20  19  CREATININE 0.92 0.82 0.87  0.83    CALCIUM 10.1 9.7 8.9  8.9  MG  --  1.5* 2.2  PHOS  --   --  2.4*   GFR: Estimated Creatinine Clearance: 44.1 mL/min (by C-G formula based on SCr of 0.83 mg/dL). Liver Function Tests: Recent Labs  Lab 05/18/18 0002 05/18/18 0632 05/19/18 0503  AST 27 26  --   ALT 18 20  --   ALKPHOS 68 69  --   BILITOT 0.7 0.6  --   PROT 6.4* 6.6  --   ALBUMIN 3.8 3.8 2.9*   Recent Labs  Lab 05/18/18 0002  LIPASE 104*   No results for input(s): AMMONIA in the last 168 hours. Coagulation Profile: Recent Labs  Lab 05/19/18 0503  INR 0.96   Cardiac Enzymes: No results for input(s): CKTOTAL, CKMB, CKMBINDEX, TROPONINI in the last 168 hours. BNP (last 3 results) No results for input(s): PROBNP in the last 8760 hours. HbA1C: No results for input(s): HGBA1C in the last 72 hours. CBG: No results for input(s): GLUCAP in the last 168 hours. Lipid Profile: No results for input(s): CHOL, HDL, LDLCALC, TRIG, CHOLHDL, LDLDIRECT in the last 72 hours. Thyroid Function Tests: No results for input(s): TSH, T4TOTAL, FREET4, T3FREE, THYROIDAB in the last 72 hours. Anemia Panel: No results for input(s): VITAMINB12, FOLATE, FERRITIN, TIBC, IRON, RETICCTPCT in the last 72 hours. Urine analysis:    Component Value Date/Time   COLORURINE YELLOW 05/18/2018 1005   APPEARANCEUR CLEAR 05/18/2018 1005   LABSPEC 1.015 05/18/2018 1005   PHURINE 7.0 05/18/2018 1005   GLUCOSEU NEGATIVE 05/18/2018 1005   HGBUR NEGATIVE 05/18/2018 1005   BILIRUBINUR NEGATIVE 05/18/2018 1005   KETONESUR 5 (A) 05/18/2018 1005   PROTEINUR NEGATIVE 05/18/2018 1005   UROBILINOGEN 0.2 08/02/2013 1342   NITRITE NEGATIVE 05/18/2018 1005   LEUKOCYTESUR TRACE (A) 05/18/2018 1005   Sepsis Labs: @LABRCNTIP (procalcitonin:4,lacticidven:4)  ) Recent Results (from the past 240 hour(s))  MRSA PCR Screening     Status: None   Collection Time: 05/18/18 11:32 PM  Result Value Ref Range Status   MRSA by PCR NEGATIVE NEGATIVE Final     Comment:        The GeneXpert MRSA Assay (FDA approved for NASAL specimens only), is one component of a comprehensive MRSA colonization surveillance program. It is not intended to diagnose MRSA infection nor to guide or monitor treatment for MRSA infections. Performed at [facility], [place] [address], [place], [place] [address]          Radiology Studies: Dg Chest 2 View  Result Date: 05/18/2018 CLINICAL DATA:  Preoperative evaluation for small bowel obstruction, history hiatal hernia, hypertension, coronary artery disease EXAM: CHEST - 2 VIEW COMPARISON:  CT abdomen and pelvis 05/04/2018 FINDINGS: Nasogastric tube coiled in a large hiatal hernia before extending into abdomen. Normal heart size, mediastinal contours, and pulmonary vascularity. Atherosclerotic calcification aorta. Subsegmental atelectasis at LEFT base. Remaining lungs clear. No pleural effusion or pneumothorax. IMPRESSION: Large hiatal hernia. Subsegmental atelectasis LEFT base. Electronically Signed   By: Lavonia Dana M.D.   On: 05/18/2018 10:57   Dg Abd  1 View  Result Date: 05/18/2018 CLINICAL DATA:  Abdominal pain, follow-up small-bowel obstruction EXAM: ABDOMEN - 1 VIEW COMPARISON:  05/18/2018 FINDINGS: Scattered gas and stool in colon. Few mildly prominent small bowel loops in mid abdomen. No bowel wall thickening. Surgical clips RIGHT upper quadrant from cholecystectomy and at the inferior pelvis bilaterally question herniorrhaphies. No urinary tract calcification. IMPRESSION: Persistent few mildly prominent small bowel loops in the mid abdomen. Electronically Signed   By: Lavonia Dana M.D.   On: 05/18/2018 10:56   Dg Abdomen 1 View  Result Date: 05/18/2018 CLINICAL DATA:  84 y/o  F; nasogastric tube placement. EXAM: ABDOMEN - 1 VIEW COMPARISON:  05/18/2018 CT abdomen and pelvis. FINDINGS: Dilated loops of small bowel in the mid abdomen. Enteric tube is coiling over the lower chest within a large  hiatal hernia. Right upper quadrant cholecystectomy clips. Moderate lumbar spine dextrocurvature. IMPRESSION: Enteric tube is coiled over the lower chest within a large hiatal hernia. Dilated loops of small bowel in the mid abdomen. Electronically Signed   By: Kristine Garbe M.D.   On: 05/18/2018 03:43   Ct Renal Stone Study  Result Date: 05/18/2018 CLINICAL DATA:  Abdominal pain and vomiting. EXAM: CT ABDOMEN AND PELVIS WITHOUT CONTRAST TECHNIQUE: Multidetector CT imaging of the abdomen and pelvis was performed following the standard protocol without IV contrast. COMPARISON:  Most recent comparison CT 10/18/2017. CT 05/02/2016 also reviewed FINDINGS: Lower chest: No pleural fluid or consolidation. Moderate-sized hiatal hernia, similar to prior exam. There are mitral annulus calcifications. Hepatobiliary: No focal hepatic lesion on noncontrast exam. Clips in the gallbladder fossa postcholecystectomy. No biliary dilatation. Pancreas: No ductal dilatation or inflammation. No CT findings of pancreatitis. Spleen: Prior splenectomy. Small soft tissue deposit in the left upper quadrant consistent with splenosis. Additional soft tissue deposits adjacent to the hiatal hernia also likely splenosis. Adrenals/Urinary Tract: No adrenal nodule. No hydronephrosis or perinephric edema. Bilateral cortical low-density lesions in both kidneys, similar to prior exam and likely cysts. No urolithiasis. Urinary bladder is physiologically distended without wall thickening. Stomach/Bowel: Lack of enteric contrast limits bowel evaluation. Moderate hiatal hernia, fluid within the intrathoracic portion of the stomach. Complex upper ventral abdominal wall hernia containing multiple loops of small bowel. Small bowel loops within the hernia and proximally are dilated and fluid-filled with mesenteric stranding and small amount of free fluid. Transition point suspected in the hernia with exiting small bowel decompressed, for example  image 37 series 2 and image 72 series 6. More distal small bowel loops are decompressed. Moderate colonic stool burden. Questionable additional hernia of the right lateral lower abdominal wall involving the cecum, image 58 series 2, with bowel extending superficial to the peritoneal reflection, but no inflammatory changes or obstruction at this site. This is proximal to the prior right inguinal hernia repair. Sigmoid colonic tortuosity. Colonic diverticulosis of the distal colon without diverticulitis. Vascular/Lymphatic: Aorto bi-iliac atherosclerosis. Limited assessment for adenopathy given lack contrast. Reproductive: Status post hysterectomy. No adnexal masses. Other: Ventral abdominal wall hernia containing dilated inflamed small bowel with stranding of the herniated fat and small amount of free fluid. Mesenteric edema involving upper abdominal small bowel. No free air or perforation. Lobular soft tissue density in the midline anterior upper abdomen anterior to the left lobe of the liver measuring 1.7 x 4.3 x 4.6 cm. Prior right and left inguinal hernia repair. Musculoskeletal: Postsurgical and degenerative change in the spine. There are no acute or suspicious osseous abnormalities. IMPRESSION: 1. Findings consistent with small bowel obstruction  with transition point in complex upper ventral abdominal wall hernia. 2. Suspect additional abdominal wall hernia containing cecum in the right lower lateral anterior abdominal wall, proximal to site of prior inguinal hernia repair. No obstruction or inflammatory changes related to this hernia. 3. Lobular soft tissue density in the central upper abdomen anterior to the left lobe of the liver. Only mild increase in size since 2017. Differential considerations include splenosis given prior splenectomy, lymph nodes or peritoneal carcinomatosis. Benign etiology is favored given lack of significant progression over the course of 2 years. 4. Chronic and incidental findings  include moderate to large hiatal hernia, colonic diverticulosis, and Aortic Atherosclerosis (ICD10-I70.0). Electronically Signed   By: Keith Rake M.D.   On: 05/18/2018 02:27        Scheduled Meds: Continuous Infusions: . sodium chloride 75 mL/hr at 05/18/18 1930  . cefoTEtan (CEFOTAN) IV    . potassium chloride 10 mEq (05/19/18 0751)     LOS: 1 day    Time spent: 25 minutes.    Dana Allan, MD  [facility] Pager #: [phone] 7PM-7AM contact night coverage as above

## 2018-05-19 NOTE — Transfer of Care (Signed)
Immediate Anesthesia Transfer of Care Note  Patient: [name]  Procedure(s) Performed: HERNIA REPAIR VENTRAL ADULT (N/A Abdomen) INSERTION OF MESH (Abdomen)  Patient Location: PACU  Anesthesia Type:General  Level of Consciousness: awake, alert , oriented and patient cooperative  Airway & Oxygen Therapy: Patient Spontanous Breathing and Patient connected to face mask oxygen  Post-op Assessment: Report given to RN, Post -op Vital signs reviewed and stable and Patient moving all extremities X 4  Post vital signs: stable  Last Vitals:  Vitals Value Taken Time  BP 160/68 05/19/2018  3:15 PM  Temp 37.1 C 05/19/2018  3:15 PM  Pulse 77 05/19/2018  3:17 PM  Resp 15 05/19/2018  3:17 PM  SpO2 99 % 05/19/2018  3:17 PM  Vitals shown include unvalidated device data.  Last Pain:  Vitals:   05/19/18 1129  TempSrc:   PainSc: 0-No pain      Patients Stated Pain Goal: 2 (29/57/47 3403)  Complications: No apparent anesthesia complications

## 2018-05-19 NOTE — Progress Notes (Signed)
  Echocardiogram 2D Echocardiogram has been performed.  [name] 05/19/2018, 8:46 AM

## 2018-05-19 NOTE — Anesthesia Procedure Notes (Signed)
Procedure Name: Intubation Date/Time: 05/19/2018 1:05 PM Performed by: Williford, Peggy D, CRNA Pre-anesthesia Checklist: Patient identified, Emergency Drugs available, Suction available and Patient being monitored Patient Re-evaluated:Patient Re-evaluated prior to induction Oxygen Delivery Method: Circle system utilized Preoxygenation: Pre-oxygenation with 100% oxygen Induction Type: IV induction and Rapid sequence Laryngoscope Size: Mac and 3 Grade View: Grade I Tube type: Oral Tube size: 7.5 mm Number of attempts: 1 Airway Equipment and Method: Stylet Placement Confirmation: ETT inserted through vocal cords under direct vision,  positive ETCO2 and breath sounds checked- equal and bilateral Secured at: 20 cm Tube secured with: Tape Dental Injury: Teeth and Oropharynx as per pre-operative assessment

## 2018-05-20 LAB — CBC WITH DIFFERENTIAL/PLATELET
Basophils Absolute: 0 10*3/uL (ref 0.0–0.1)
Basophils Relative: 0 %
Eosinophils Absolute: 0 10*3/uL (ref 0.0–0.7)
Eosinophils Relative: 0 %
HCT: 43.6 % (ref 36.0–46.0)
Hemoglobin: 14.4 g/dL (ref 12.0–15.0)
Lymphocytes Relative: 7 %
Lymphs Abs: 0.9 10*3/uL (ref 0.7–4.0)
MCH: 30.4 pg (ref 26.0–34.0)
MCHC: 33 g/dL (ref 30.0–36.0)
MCV: 92.2 fL (ref 78.0–100.0)
Monocytes Absolute: 1 10*3/uL (ref 0.1–1.0)
Monocytes Relative: 8 %
Neutro Abs: 10.3 10*3/uL — ABNORMAL HIGH (ref 1.7–7.7)
Neutrophils Relative %: 85 %
Platelets: 205 10*3/uL (ref 150–400)
RBC: 4.73 MIL/uL (ref 3.87–5.11)
RDW: 16.8 % — ABNORMAL HIGH (ref 11.5–15.5)
WBC: 12.2 10*3/uL — ABNORMAL HIGH (ref 4.0–10.5)

## 2018-05-20 LAB — RENAL FUNCTION PANEL
Albumin: 2.8 g/dL — ABNORMAL LOW (ref 3.5–5.0)
Anion gap: 8 (ref 5–15)
BUN: 21 mg/dL (ref 8–23)
CO2: 25 mmol/L (ref 22–32)
Calcium: 9 mg/dL (ref 8.9–10.3)
Chloride: 108 mmol/L (ref 98–111)
Creatinine, Ser: 0.92 mg/dL (ref 0.44–1.00)
GFR calc Af Amer: >60 mL/min (ref >60)
GFR calc non Af Amer: 54 mL/min — ABNORMAL LOW (ref >60)
Glucose, Bld: 147 mg/dL — ABNORMAL HIGH (ref 70–99)
Phosphorus: 3 mg/dL (ref 2.5–4.6)
Potassium: 4.1 mmol/L (ref 3.5–5.1)
Sodium: 141 mmol/L (ref 135–145)

## 2018-05-20 LAB — MAGNESIUM: Magnesium: 1.9 mg/dL (ref 1.7–2.4)

## 2018-05-20 NOTE — Progress Notes (Signed)
PROGRESS NOTE    [name]  QMG:[id number] DOB: April 28, 1929 DOA: 05/17/2018 PCP: Lajean Manes, MD  Outpatient Specialists:   Brief Narrative:  Patient is an 84 year old female, with past medical history significant for coronary artery disease, hypertension and hyperlipidemia.  Patient has severe hiatal hernia as well as ventral abdominal wall hernia.  Patient was admitted with small bowel obstruction with transition point in complex upper ventral abdominal wall hernia.  Surgical input is appreciated.  For possible surgery as per surgical team.  EKG reveals possible LVH.  Echocardiogram is pending.  Patient reported being fairly active prior to admission.  No limitations in activity, and no associated chest pain or shortness of breath with activity.  Prior history of coronary artery disease documented, but patient could not remember details.  Hypokalemia is noted.  Magnesium has been repleted.  Magnesium today is 2.2.  Patient actually looks better today.  NG tube to low suction continues to suction gastric contents.  However, patient reported having broken wind.  Last bowel movement was prior to admission.  Surgical input is highly appreciated.  05/20/2018: Patient underwent surgery yesterday.  Postop surgery management as per surgical team.  Assessment & Plan:   Principal Problem:   SBO (small bowel obstruction) (HCC) Active Problems:   Hypertension   Incarcerated incisional hernia - reduced   Small bowel obstruction: Transition point seen involving the ventral hernia. Continue n.p.o. Continue NG tube to low suction Repeat abdominal KUB Optimize electrolytes Follow echocardiogram No limitation with activity prior to admission Medically optimized. Adequate pain control postop Further management will depend on hospital course. 05/20/2018:  Patient underwent surgery on 05/19/2018.  Patient remains stable.  Hypokalemia: Repleted Continue to monitor potassium and magnesium  level.  Hypomagnesemia: Repleted.  Hypertension: Continue to optimize.  History of CAD: Asymptomatic.  No limitation with activity Patient can not remember details.   DVT prophylaxis: SCDs in anticipation of possible procedure. Code Status: DNR. Family Communication: Discussed with patient. Disposition Plan: Home. Consults called: General surgery.  Procedures:  Echo revealed "- Left ventricle: The cavity size was normal. Wall thickness was   normal. Systolic function was normal. The estimated ejection   fraction was in the range of 60% to 65%. Wall motion was normal;   there were no regional wall motion abnormalities. Doppler   parameters are consistent with abnormal left ventricular   relaxation (grade 1 diastolic dysfunction). The E/e&' ratio is   between 8-15, suggesting indeterminate LV filling pressure. - Aortic valve: Trileaflet. No significant stenosis with immobility   of the non-coronary cusp. Mean gradient (S): 9 mm Hg. Peak   gradient (S): 16 mm Hg. Valve area (VTI): 2.63 cm^2. Valve area   (Vmax): 2.44 cm^2. Valve area (Vmean): 2.58 cm^2. - Mitral valve: Calcified annulus. Mildly thickened leaflets .   There was trivial regurgitation. - Left atrium: Moderately dilated. - Right atrium: Moderately dilated. - Tricuspid valve: There was mild regurgitation. - Pulmonary arteries: PA peak pressure: 35 mm Hg (S). - Inferior vena cava: The vessel was normal in size. The   respirophasic diameter changes were in the normal range (>= 50%),   consistent with normal central venous pressure.  Impressions:  - Compared to a prior study in 2014, there is now moderate   posterior MAC and aortic valve calcification with immobility of   the non-coronary cusp and borderline aortic stenosis and moderate biatrial enlargement".   Surgery for hernia repair/small bowel obstruction on 05/19/2018.    Antimicrobials:   None  Subjective: No new complaints. No chest pain or  shortness of breath. No fever or chills. No nausea or vomiting. Has not broken the wound or had bowel movements. However, patient appreciates rumbling of the colon.  Objective: Vitals:   05/20/18 0211 05/20/18 0321 05/20/18 0454 05/20/18 0909  BP: (!) 184/81 123/61 (!) 140/54 (!) 152/53  Pulse: (!) 53 60 (!) 55 60  Resp:   16 16  Temp: 98 F (36.7 C)  98.5 F (36.9 C) 98.1 F (36.7 C)  TempSrc: Oral  Oral Oral  SpO2: 98% 98% 99% 100%  Weight:      Height:        Intake/Output Summary (Last 24 hours) at 05/20/2018 1254 Last data filed at 05/20/2018 0200 Gross per 24 hour  Intake 3427.18 ml  Output 1025 ml  Net 2402.18 ml   Filed Weights   05/18/18 0458  Weight: 67.6 kg    Examination:  General exam: Appears calm and comfortable.  NG to suction. Respiratory system: Clear to auscultation.  Cardiovascular system: S1 & S2. No pedal edema. Gastrointestinal system: Bowel sound is hypoactive.   Central nervous system: Alert and oriented. No focal neurological deficits. Extremities: No leg edema.    Data Reviewed: I have personally reviewed following labs and imaging studies  CBC: Recent Labs  Lab 05/18/18 0002 05/18/18 0632 05/19/18 0503 05/20/18 0430  WBC 7.8 11.7* 8.1 12.2*  NEUTROABS 6.2 10.2* 5.7 10.3*  HGB 15.4* 15.6* 14.2 14.4  HCT 46.2* 46.7* 43.3 43.6  MCV 90.8 91.0 91.2 92.2  PLT 227 234 215 235   Basic Metabolic Panel: Recent Labs  Lab 05/18/18 0002 05/18/18 0632 05/19/18 0503 05/20/18 0430  NA 138 137 140  139 141  K 4.1 3.8 3.2*  3.2* 4.1  CL 99 98 104  104 108  CO2 _0 GLUCOSE 186* 170* 111*  111* 147*  BUN 27* 25* _1 CREATININE 0.92 0.82 0.87  0.83 0.92  CALCIUM 10.1 9.7 8.9  8.9 9.0  MG  --  1.5* 2.2 1.9  PHOS  --   --  2.4* 3.0   GFR: Estimated Creatinine Clearance: 39.8 mL/min (by C-G formula based on SCr of 0.92 mg/dL). Liver Function Tests: Recent Labs  Lab 05/18/18 0002 05/18/18 0632  05/19/18 0503 05/20/18 0430  AST 27 26  --   --   ALT 18 20  --   --   ALKPHOS 68 69  --   --   BILITOT 0.7 0.6  --   --   PROT 6.4* 6.6  --   --   ALBUMIN 3.8 3.8 2.9* 2.8*   Recent Labs  Lab 05/18/18 0002  LIPASE 104*   No results for input(s): AMMONIA in the last 168 hours. Coagulation Profile: Recent Labs  Lab 05/19/18 0503  INR 0.96   Cardiac Enzymes: No results for input(s): CKTOTAL, CKMB, CKMBINDEX, TROPONINI in the last 168 hours. BNP (last 3 results) No results for input(s): PROBNP in the last 8760 hours. HbA1C: No results for input(s): HGBA1C in the last 72 hours. CBG: No results for input(s): GLUCAP in the last 168 hours. Lipid Profile: No results for input(s): CHOL, HDL, LDLCALC, TRIG, CHOLHDL, LDLDIRECT in the last 72 hours. Thyroid Function Tests: No results for input(s): TSH, T4TOTAL, FREET4, T3FREE, THYROIDAB in the last 72 hours. Anemia Panel: No results for input(s): VITAMINB12, FOLATE, FERRITIN, TIBC, IRON, RETICCTPCT in the last 72 hours. Urine analysis:  Component Value Date/Time   COLORURINE YELLOW 05/18/2018 1005   APPEARANCEUR CLEAR 05/18/2018 1005   LABSPEC 1.015 05/18/2018 1005   PHURINE 7.0 05/18/2018 1005   GLUCOSEU NEGATIVE 05/18/2018 1005   HGBUR NEGATIVE 05/18/2018 1005   BILIRUBINUR NEGATIVE 05/18/2018 1005   KETONESUR 5 (A) 05/18/2018 1005   PROTEINUR NEGATIVE 05/18/2018 1005   UROBILINOGEN 0.2 08/02/2013 1342   NITRITE NEGATIVE 05/18/2018 1005   LEUKOCYTESUR TRACE (A) 05/18/2018 1005   Sepsis Labs: _0 (procalcitonin:4,lacticidven:4)  ) Recent Results (from the past 240 hour(s))  MRSA PCR Screening     Status: None   Collection Time: 05/18/18 11:32 PM  Result Value Ref Range Status   MRSA by PCR NEGATIVE NEGATIVE Final    Comment:        The GeneXpert MRSA Assay (FDA approved for NASAL specimens only), is one component of a comprehensive MRSA colonization surveillance program. It is not intended to diagnose  MRSA infection nor to guide or monitor treatment for MRSA infections. Performed at [facility], [place] [address], [place], [place] [address]          Radiology Studies: Dg Abd 1 View  Result Date: 05/19/2018 CLINICAL DATA:  Small-bowel obstruction.  Follow-up. EXAM: ABDOMEN - 1 VIEW COMPARISON:  05/18/2018 FINDINGS: Nasogastric tube is no longer visible. Bowel gas pattern is similar, with a good bit of air/gas in the small and large bowel and fecal matter in the right colon. A prominent small bowel loop seen in the mid abdomen yesterday is probably slightly less prominent. Certainly there does not appear to be any worsening. IMPRESSION: Similar appearance to yesterday. Possible slight decreased dilatation of at least 1 small bowel loop. No sign of worsening. Electronically Signed   By: Nelson Chimes M.D.   On: 05/19/2018 11:40        Scheduled Meds: . acetaminophen  650 mg Oral Q6H  . enoxaparin (LOVENOX) injection  40 mg Subcutaneous Q24H   Continuous Infusions: . sodium chloride 75 mL/hr at 05/20/18 0649     LOS: 2 days    Time spent: 25 minutes.    Dana Allan, MD  [facility] Pager #: [phone] 7PM-7AM contact night coverage as above

## 2018-05-20 NOTE — Progress Notes (Signed)
1 Day Post-Op Retrorectus repair ventral incisional hernia with mesh Subjective: No nausea, pain controlled.  No flatus  Objective: Vital signs in last 24 hours: Temp:  [97.8 F (36.6 C)-98.8 F (37.1 C)] 98.1 F (36.7 C) (10/05 0909) Pulse Rate:  [53-76] 60 (10/05 0909) Resp:  [11-19] 16 (10/05 0909) BP: (123-184)/(53-81) 152/53 (10/05 0909) SpO2:  [94 %-100 %] 100 % (10/05 0909)   Intake/Output from previous day: 10/04 0701 - 10/05 0700 In: 4327.2 [P.O.:120; I.V.:3307.2; NG/GT:900] Out: 1025 [Urine:900; Drains:75; Blood:50] Intake/Output this shift: No intake/output data recorded.   General appearance: alert and cooperative GI: normal findings: soft, non-distended JP's: dark, bloody drainage Incision: no significant drainage  Lab Results:  Recent Labs    05/19/18 0503 05/20/18 0430  WBC 8.1 12.2*  HGB 14.2 14.4  HCT 43.3 43.6  PLT 215 205   BMET Recent Labs    05/19/18 0503 05/20/18 0430  NA 140  139 141  K 3.2*  3.2* 4.1  CL 104  104 108  CO2 27  26 25   GLUCOSE 111*  111* 147*  BUN 20  19 21   CREATININE 0.87  0.83 0.92  CALCIUM 8.9  8.9 9.0   PT/INR Recent Labs    05/19/18 0503  LABPROT 12.6  INR 0.96   ABG No results for input(s): PHART, HCO3 in the last 72 hours.  Invalid input(s): PCO2, PO2  MEDS, Scheduled . acetaminophen  650 mg Oral Q6H  . enoxaparin (LOVENOX) injection  40 mg Subcutaneous Q24H    Studies/Results: Dg Abd 1 View  Result Date: 05/19/2018 CLINICAL DATA:  Small-bowel obstruction.  Follow-up. EXAM: ABDOMEN - 1 VIEW COMPARISON:  05/18/2018 FINDINGS: Nasogastric tube is no longer visible. Bowel gas pattern is similar, with a good bit of air/gas in the small and large bowel and fecal matter in the right colon. A prominent small bowel loop seen in the mid abdomen yesterday is probably slightly less prominent. Certainly there does not appear to be any worsening. IMPRESSION: Similar appearance to yesterday. Possible  slight decreased dilatation of at least 1 small bowel loop. No sign of worsening. Electronically Signed   By: Nelson Chimes M.D.   On: 05/19/2018 11:40    Assessment: s/p Procedure(s): HERNIA REPAIR VENTRAL ADULT INSERTION OF MESH Patient Active Problem List   Diagnosis Date Noted  . Incarcerated incisional hernia - reduced 05/19/2018  . SBO (small bowel obstruction) ([place]) 05/18/2018  . Bilateral primary osteoarthritis of knee 07/22/2017  . Hyperlipidemia 05/20/2014  . Hypotension 08/02/2013  . UTI (lower urinary tract infection) 08/02/2013  . Acute renal failure ([place]) 08/02/2013  . ARF (acute renal failure) ([place]) 08/02/2013  . Calculus of gallbladder with acute cholecystitis, without mention of obstruction 07/10/2013  . Acute cholecystitis 06/17/2013  . Chest pain 06/17/2013  . Pulmonary infiltrate in left lung on chest x-ray 06/17/2013  . Hypertension   . CAD (coronary artery disease)     Expected post op course  Plan: OOB and ambulate with assistance  Await return of bowel function Cont npo, ivf's   LOS: 2 days     .Rosario Adie, MD [facility], [place]   05/20/2018 11:23 AM

## 2018-05-21 DIAGNOSIS — L8990 Pressure ulcer of unspecified site, unspecified stage: Secondary | ICD-10-CM

## 2018-05-21 LAB — TROPONIN I: Troponin I: <0.03 ng/mL (ref <0.03)

## 2018-05-21 MED ORDER — HYDRALAZINE HCL 20 MG/ML IJ SOLN
10.0000 mg | Freq: Once | INTRAMUSCULAR | Status: AC
  Administered 2018-05-21: 10 mg via INTRAVENOUS
  Filled 2018-05-21: qty 1

## 2018-05-21 NOTE — Progress Notes (Signed)
PROGRESS NOTE    [name]  ZOX:[id number] DOB: October 08, 1928 DOA: 05/17/2018 PCP: Lajean Manes, MD  Outpatient Specialists:   Brief Narrative:  Patient is an 84 year old female, with past medical history significant for coronary artery disease, hypertension and hyperlipidemia.  Patient has severe hiatal hernia as well as ventral abdominal wall hernia.  Patient was admitted with small bowel obstruction with transition point in complex upper ventral abdominal wall hernia.  Surgical input is appreciated.  For possible surgery as per surgical team.  EKG reveals possible LVH.  Echocardiogram is pending.  Patient reported being fairly active prior to admission.  No limitations in activity, and no associated chest pain or shortness of breath with activity.  Prior history of coronary artery disease documented, but patient could not remember details.  Hypokalemia is noted.  Magnesium has been repleted.  Magnesium today is 2.2.  Patient actually looks better today.  NG tube to low suction continues to suction gastric contents.  However, patient reported having broken wind.  Last bowel movement was prior to admission.  Surgical input is highly appreciated.  05/21/2018: Patient underwent surgery on 05/19/2018.  Postop, the patient has done well.  Patient reported having had bowel movement overnight.  No new complaints today.    Assessment & Plan:   Principal Problem:   SBO (small bowel obstruction) (HCC) Active Problems:   Hypertension   Incarcerated incisional hernia - reduced   Pressure injury of skin   Small bowel obstruction: Transition point seen involving the ventral hernia. Continue n.p.o. Continue NG tube to low suction Repeat abdominal KUB Optimize electrolytes Follow echocardiogram No limitation with activity prior to admission Medically optimized. Adequate pain control postop Further management will depend on hospital course. 05/21/2018:  Patient underwent surgery on 05/19/2018.   Patient has had bowel movement.  Surgical input is highly appreciated.  Further management as per surgical team.  Hypokalemia: Repleted Continue to monitor potassium and magnesium level.  Hypomagnesemia: Repleted.  Hypertension: Continue to optimize.  History of CAD: Asymptomatic.  No limitation with activity Patient can not remember details.   DVT prophylaxis: SCDs in anticipation of possible procedure. Code Status: DNR. Family Communication: Discussed with patient. Disposition Plan: Home. Consults called: General surgery.  Procedures:  Echo revealed "- Left ventricle: The cavity size was normal. Wall thickness was   normal. Systolic function was normal. The estimated ejection   fraction was in the range of 60% to 65%. Wall motion was normal;   there were no regional wall motion abnormalities. Doppler   parameters are consistent with abnormal left ventricular   relaxation (grade 1 diastolic dysfunction). The E/e&' ratio is   between 8-15, suggesting indeterminate LV filling pressure. - Aortic valve: Trileaflet. No significant stenosis with immobility   of the non-coronary cusp. Mean gradient (S): 9 mm Hg. Peak   gradient (S): 16 mm Hg. Valve area (VTI): 2.63 cm^2. Valve area   (Vmax): 2.44 cm^2. Valve area (Vmean): 2.58 cm^2. - Mitral valve: Calcified annulus. Mildly thickened leaflets .   There was trivial regurgitation. - Left atrium: Moderately dilated. - Right atrium: Moderately dilated. - Tricuspid valve: There was mild regurgitation. - Pulmonary arteries: PA peak pressure: 35 mm Hg (S). - Inferior vena cava: The vessel was normal in size. The   respirophasic diameter changes were in the normal range (>= 50%),   consistent with normal central venous pressure.  Impressions:  - Compared to a prior study in 2014, there is now moderate   posterior MAC and  aortic valve calcification with immobility of   the non-coronary cusp and borderline aortic stenosis and  moderate biatrial enlargement".   Surgery for hernia repair/small bowel obstruction on 05/19/2018.    Antimicrobials:   None   Subjective: No new complaints. No chest pain or shortness of breath. No fever or chills. No vomiting. Patient has had bowel movements.  Objective: Vitals:   05/21/18 0243 05/21/18 0338 05/21/18 0411 05/21/18 0505  BP: (!) 140/47 (!) 164/59 (!) 160/58 (!) 138/51  Pulse: 66 63  63  Resp:      Temp:    98.5 F (36.9 C)  TempSrc:    Oral  SpO2:    97%  Weight:      Height:        Intake/Output Summary (Last 24 hours) at 05/21/2018 1324 Last data filed at 05/21/2018 1101 Gross per 24 hour  Intake 2624.4 ml  Output 1755 ml  Net 869.4 ml   Filed Weights   05/18/18 0458  Weight: 67.6 kg    Examination:  General exam: Appears calm and comfortable.  NG to suction. Respiratory system: Clear to auscultation.  Cardiovascular system: S1 & S2. No pedal edema. Gastrointestinal system: Soft and nontender. Central nervous system: Alert and oriented. No focal neurological deficits. Extremities: No leg edema.    Data Reviewed: I have personally reviewed following labs and imaging studies  CBC: Recent Labs  Lab 05/18/18 0002 05/18/18 0632 05/19/18 0503 05/20/18 0430  WBC 7.8 11.7* 8.1 12.2*  NEUTROABS 6.2 10.2* 5.7 10.3*  HGB 15.4* 15.6* 14.2 14.4  HCT 46.2* 46.7* 43.3 43.6  MCV 90.8 91.0 91.2 92.2  PLT 227 234 215 071   Basic Metabolic Panel: Recent Labs  Lab 05/18/18 0002 05/18/18 0632 05/19/18 0503 05/20/18 0430  NA 138 137 140  139 141  K 4.1 3.8 3.2*  3.2* 4.1  CL 99 98 104  104 108  CO2 _0 GLUCOSE 186* 170* 111*  111* 147*  BUN 27* 25* _1 CREATININE 0.92 0.82 0.87  0.83 0.92  CALCIUM 10.1 9.7 8.9  8.9 9.0  MG  --  1.5* 2.2 1.9  PHOS  --   --  2.4* 3.0   GFR: Estimated Creatinine Clearance: 39.8 mL/min (by C-G formula based on SCr of 0.92 mg/dL). Liver Function Tests: Recent Labs  Lab  05/18/18 0002 05/18/18 0632 05/19/18 0503 05/20/18 0430  AST 27 26  --   --   ALT 18 20  --   --   ALKPHOS 68 69  --   --   BILITOT 0.7 0.6  --   --   PROT 6.4* 6.6  --   --   ALBUMIN 3.8 3.8 2.9* 2.8*   Recent Labs  Lab 05/18/18 0002  LIPASE 104*   No results for input(s): AMMONIA in the last 168 hours. Coagulation Profile: Recent Labs  Lab 05/19/18 0503  INR 0.96   Cardiac Enzymes: Recent Labs  Lab 05/21/18 0432  TROPONINI <0.03   BNP (last 3 results) No results for input(s): PROBNP in the last 8760 hours. HbA1C: No results for input(s): HGBA1C in the last 72 hours. CBG: No results for input(s): GLUCAP in the last 168 hours. Lipid Profile: No results for input(s): CHOL, HDL, LDLCALC, TRIG, CHOLHDL, LDLDIRECT in the last 72 hours. Thyroid Function Tests: No results for input(s): TSH, T4TOTAL, FREET4, T3FREE, THYROIDAB in the last 72 hours. Anemia Panel: No results for input(s): VITAMINB12,  FOLATE, FERRITIN, TIBC, IRON, RETICCTPCT in the last 72 hours. Urine analysis:    Component Value Date/Time   COLORURINE YELLOW 05/18/2018 1005   APPEARANCEUR CLEAR 05/18/2018 1005   LABSPEC 1.015 05/18/2018 1005   PHURINE 7.0 05/18/2018 1005   GLUCOSEU NEGATIVE 05/18/2018 1005   HGBUR NEGATIVE 05/18/2018 1005   BILIRUBINUR NEGATIVE 05/18/2018 1005   KETONESUR 5 (A) 05/18/2018 1005   PROTEINUR NEGATIVE 05/18/2018 1005   UROBILINOGEN 0.2 08/02/2013 1342   NITRITE NEGATIVE 05/18/2018 1005   LEUKOCYTESUR TRACE (A) 05/18/2018 1005   Sepsis Labs: _0 (procalcitonin:4,lacticidven:4)  ) Recent Results (from the past 240 hour(s))  MRSA PCR Screening     Status: None   Collection Time: 05/18/18 11:32 PM  Result Value Ref Range Status   MRSA by PCR NEGATIVE NEGATIVE Final    Comment:        The GeneXpert MRSA Assay (FDA approved for NASAL specimens only), is one component of a comprehensive MRSA colonization surveillance program. It is not intended to diagnose  MRSA infection nor to guide or monitor treatment for MRSA infections. Performed at [facility], [address], [place], [place] [address]          Radiology Studies: No results found.      Scheduled Meds: . acetaminophen  650 mg Oral Q6H  . enoxaparin (LOVENOX) injection  40 mg Subcutaneous Q24H   Continuous Infusions: . sodium chloride 75 mL/hr at 05/21/18 1000     LOS: 3 days    Time spent: 25 minutes.    Dana Allan, MD  [facility] Pager #: [phone] 7PM-7AM contact night coverage as above

## 2018-05-21 NOTE — Progress Notes (Signed)
Assessment & Plan: POD#2 - status post ventral incisional hernia repair with mesh  Begin clear liquid diet  OOB, ambulate with assist  Monitor drain output, wear abd binder        Armandina Gemma, MD       [facility]       Office: [phone]   Chief Complaint: Ventral incisional hernia, hx of SBO  Subjective: Patient in bed, comfortable.  Passing flatus.  Objective: Vital signs in last 24 hours: Temp:  [98.1 F (36.7 C)-98.8 F (37.1 C)] 98.5 F (36.9 C) (10/06 0505) Pulse Rate:  [54-66] 63 (10/06 0505) Resp:  [14-16] 16 (10/06 0215) BP: (138-198)/(47-77) 138/51 (10/06 0505) SpO2:  [97 %-100 %] 97 % (10/06 0505) Last BM Date: 05/18/18  Intake/Output from previous day: 10/05 0701 - 10/06 0700 In: 2229.6 [P.O.:90; I.V.:2139.6] Out: 1140 [Urine:1100; Drains:40] Intake/Output this shift: No intake/output data recorded.  Physical Exam: HEENT - sclerae clear, mucous membranes moist Neck - soft Chest - clear bilaterally Cor - RRR Abdomen - soft, mild distension; BS present; wound dry and intact; JP's with thin serosanguinous  Lab Results:  Recent Labs    05/19/18 0503 05/20/18 0430  WBC 8.1 12.2*  HGB 14.2 14.4  HCT 43.3 43.6  PLT 215 205   BMET Recent Labs    05/19/18 0503 05/20/18 0430  NA 140  139 141  K 3.2*  3.2* 4.1  CL 104  104 108  CO2 27  26 25   GLUCOSE 111*  111* 147*  BUN 20  19 21   CREATININE 0.87  0.83 0.92  CALCIUM 8.9  8.9 9.0   PT/INR Recent Labs    05/19/18 0503  LABPROT 12.6  INR 0.96   Comprehensive Metabolic Panel:    Component Value Date/Time   NA 141 05/20/2018 0430   NA 139 05/19/2018 0503   NA 140 05/19/2018 0503   NA 139 09/07/2016 0958   K 4.1 05/20/2018 0430   K 3.2 (L) 05/19/2018 0503   K 3.2 (L) 05/19/2018 0503   K 3.9 09/07/2016 0958   CL 108 05/20/2018 0430   CL 104 05/19/2018 0503   CL 104 05/19/2018 0503   CO2 25 05/20/2018 0430   CO2 26 05/19/2018 0503   CO2 27  05/19/2018 0503   CO2 24 09/07/2016 0958   BUN 21 05/20/2018 0430   BUN 19 05/19/2018 0503   BUN 20 05/19/2018 0503   BUN 27.2 (H) 09/07/2016 0958   CREATININE 0.92 05/20/2018 0430   CREATININE 0.83 05/19/2018 0503   CREATININE 0.87 05/19/2018 0503   CREATININE 1.0 09/07/2016 0958   GLUCOSE 147 (H) 05/20/2018 0430   GLUCOSE 111 (H) 05/19/2018 0503   GLUCOSE 111 (H) 05/19/2018 0503   GLUCOSE 98 09/07/2016 0958   CALCIUM 9.0 05/20/2018 0430   CALCIUM 8.9 05/19/2018 0503   CALCIUM 8.9 05/19/2018 0503   CALCIUM 10.4 09/07/2016 0958   AST 26 05/18/2018 0632   AST 27 05/18/2018 0002   AST 25 09/07/2016 0958   ALT 20 05/18/2018 0632   ALT 18 05/18/2018 0002   ALT 15 09/07/2016 0958   ALKPHOS 69 05/18/2018 0632   ALKPHOS 68 05/18/2018 0002   ALKPHOS 82 09/07/2016 0958   BILITOT 0.6 05/18/2018 0632   BILITOT 0.7 05/18/2018 0002   BILITOT 0.35 09/07/2016 0958   PROT 6.6 05/18/2018 0632   PROT 6.4 (L) 05/18/2018 0002   PROT 6.7 09/07/2016 0958   ALBUMIN 2.8 (L) 05/20/2018  0430   ALBUMIN 2.9 (L) 05/19/2018 0503   ALBUMIN 3.7 09/07/2016 2119    Studies/Results: Dg Abd 1 View  Result Date: 05/19/2018 CLINICAL DATA:  Small-bowel obstruction.  Follow-up. EXAM: ABDOMEN - 1 VIEW COMPARISON:  05/18/2018 FINDINGS: Nasogastric tube is no longer visible. Bowel gas pattern is similar, with a good bit of air/gas in the small and large bowel and fecal matter in the right colon. A prominent small bowel loop seen in the mid abdomen yesterday is probably slightly less prominent. Certainly there does not appear to be any worsening. IMPRESSION: Similar appearance to yesterday. Possible slight decreased dilatation of at least 1 small bowel loop. No sign of worsening. Electronically Signed   By: Nelson Chimes M.D.   On: 05/19/2018 11:40      GERKIN,TODD M 05/21/2018  Patient ID: [name], female   DOB: August 24, 1928, 84 y.o.   MRN: [id number]

## 2018-05-22 ENCOUNTER — Encounter (HOSPITAL_COMMUNITY): Payer: Self-pay | Admitting: General Surgery

## 2018-05-22 DIAGNOSIS — E876 Hypokalemia: Secondary | ICD-10-CM

## 2018-05-22 MED ORDER — TRAMADOL HCL 50 MG PO TABS
50.0000 mg | ORAL_TABLET | Freq: Four times a day (QID) | ORAL | Status: DC | PRN
  Administered 2018-05-22 – 2018-05-25 (×2): 50 mg via ORAL
  Filled 2018-05-22 (×2): qty 1

## 2018-05-22 MED ORDER — BOOST / RESOURCE BREEZE PO LIQD CUSTOM
1.0000 | Freq: Two times a day (BID) | ORAL | Status: DC
  Administered 2018-05-22: 14:00:00 via ORAL

## 2018-05-22 MED ORDER — SODIUM CHLORIDE 0.9 % IV SOLN
INTRAVENOUS | Status: AC
  Administered 2018-05-22 – 2018-05-23 (×2): via INTRAVENOUS

## 2018-05-22 NOTE — Progress Notes (Signed)
PT Cancellation Note  Patient Details Name: [name] MRN: [id number] DOB: 07/16/1929   Cancelled Treatment:    Reason Eval/Treat Not Completed: Other (comment); patient just back to bed after up in chair a few hours and states walked with nursing staff.  Will attempt later as time permits.   [name] 05/22/2018, 1:20 PM  Magda Kiel, [place] [phone] 05/22/2018

## 2018-05-22 NOTE — Evaluation (Signed)
Physical Therapy Evaluation Patient Details Name: [name] MRN: [id number] DOB: August 25, 1928 Today's Date: 05/22/2018   History of Present Illness  Patient is an 84 y/o admitted with Incarcerated incisional hernia with small bowel obstruction,  s/p Retrorectus repair ventral incisional hernia with mesh, Dr. Excell Seltzer 10-4.  PMH positive for arthritis and HTN and prior abdominal surgery, knee surgery, back and shoulder surgery.  Clinical Impression  Patient presents with decreased independence with mobility due to pain, weakness and decreased activity tolerance.  She will benefit from skilled PT in the acute setting to allow return home following STSNF level rehab stay.      Follow Up Recommendations SNF;Supervision/Assistance - 24 hour    Equipment Recommendations  None recommended by PT    Recommendations for Other Services OT consult     Precautions / Restrictions Precautions Precautions: Fall Required Braces or Orthoses: Other Brace/Splint Other Brace/Splint: abdominal binder      Mobility  Bed Mobility Overal bed mobility: Needs Assistance Bed Mobility: Rolling;Sidelying to Sit;Sit to Supine Rolling: Min assist Sidelying to sit: Mod assist   Sit to supine: Mod assist   General bed mobility comments: cues for technique, assist to lift trunk; assist for legs onto bed  Transfers Overall transfer level: Needs assistance Equipment used: Rolling walker (2 wheeled) Transfers: Sit to/from Stand Sit to Stand: Mod assist         General transfer comment: lifting help from EOB, increased time,   Ambulation/Gait Ambulation/Gait assistance: Min assist;+2 safety/equipment(chair follow) Gait Distance (Feet): 60 Feet Assistive device: Rolling walker (2 wheeled) Gait Pattern/deviations: Step-through pattern;Step-to pattern;Trunk flexed;Decreased stride length     General Gait Details: slow pace, antalgic with knee pain on L per pt  Stairs            Wheelchair  Mobility    Modified Rankin (Stroke Patients Only)       Balance Overall balance assessment: Needs assistance   Sitting balance-Leahy Scale: Fair     Standing balance support: Bilateral upper extremity supported Standing balance-Leahy Scale: Poor Standing balance comment: UE support needed for balance                             Pertinent Vitals/Pain Pain Assessment: Faces Faces Pain Scale: Hurts even more Pain Location: abdomen with mobility Pain Descriptors / Indicators: Operative site guarding;Grimacing Pain Intervention(s): Monitored during session;Repositioned    Home Living Family/patient expects to be discharged to:: Private residence Living Arrangements: Alone Available Help at Discharge: Family;Available PRN/intermittently Type of Home: Independent living facility([facility]) Home Access: Level entry     Home Layout: One level Home Equipment: Walker - 2 wheels Additional Comments: ILF at [employer]    Prior Function Level of Independence: Independent with assistive device(s)         Comments: walks with walker, drives to church and to grocery store, takes noon meal at dining facility at [employer]        Extremity/Trunk Assessment   Upper Extremity Assessment Upper Extremity Assessment: Defer to OT evaluation    Lower Extremity Assessment Lower Extremity Assessment: Generalized weakness    Cervical / Trunk Assessment Cervical / Trunk Assessment: Kyphotic  Communication   Communication: No difficulties  Cognition Arousal/Alertness: Awake/alert Behavior During Therapy: WFL for tasks assessed/performed Overall Cognitive Status: Within Functional Limits for tasks assessed  General Comments      Exercises     Assessment/Plan    PT Assessment Patient needs continued PT services  PT Problem List Decreased strength;Decreased mobility;Decreased activity  tolerance;Decreased balance;Decreased knowledge of use of DME;Pain       PT Treatment Interventions DME instruction;Therapeutic activities;Therapeutic exercise;Gait training;Patient/family education;Balance training;Functional mobility training    PT Goals (Current goals can be found in the Care Plan section)  Acute Rehab PT Goals Patient Stated Goal: to return to independent after rehab stay PT Goal Formulation: With patient Time For Goal Achievement: 06/05/18 Potential to Achieve Goals: Good    Frequency Min 3X/week   Barriers to discharge        Co-evaluation               AM-PAC PT "6 Clicks" Daily Activity  Outcome Measure Difficulty turning over in bed (including adjusting bedclothes, sheets and blankets)?: Unable Difficulty moving from lying on back to sitting on the side of the bed? : Unable Difficulty sitting down on and standing up from a chair with arms (e.g., wheelchair, bedside commode, etc,.)?: Unable Help needed moving to and from a bed to chair (including a wheelchair)?: A Lot Help needed walking in hospital room?: A Little Help needed climbing 3-5 steps with a railing? : Total 6 Click Score: 9    End of Session Equipment Utilized During Treatment: Gait belt Activity Tolerance: Patient limited by fatigue Patient left: in bed;with call bell/phone within reach   PT Visit Diagnosis: Other abnormalities of gait and mobility (R26.89);Muscle weakness (generalized) (M62.81)    Time: 6195-0932 PT Time Calculation (min) (ACUTE ONLY): 16 min   Charges:   PT Evaluation $PT Eval Moderate Complexity: [place], [place] Acute Rehabilitation Services [phone] 05/22/2018   [name] 05/22/2018, 4:32 PM

## 2018-05-22 NOTE — Progress Notes (Signed)
[facility] PROGRESS NOTE  [name] XVQ:[id number] DOB: [birth year] DOA: 05/17/2018 PCP: Lajean Manes, MD  Brief summary   84 year old female, with past medical history significant for coronary artery disease, hypertension and hyperlipidemia. Patient has severe hiatal hernia as well as ventral abdominal wall hernia. Patient was admitted with small bowel obstruction with transition point in complex upper ventral abdominal wall hernia. EKG reveals possible LVH.  Patient reported being fairly active prior to admission.  No limitations in activity, and no associated chest pain or shortness of breath with activity.  Prior history of coronary artery disease documented, but patient could not remember details.   05/19/2018: Patient underwent surgery.      Assessment/Plan:  Small bowel obstruction: Transition point seen involving the ventral hernia. Initially, on NG tube suction 05/19/2018:  Patient underwent surgery: Retrorectus repair ventral incisional hernia with mesh. Surgical input is highly appreciated.  Further management as per surgical team.  Hypokalemia: Repleted. Monitor  Hypomagnesemia: Repleted. Hypertension: home regimen hctz. Will resume when able to take enough PO History of KDT:[id number]. No limitation with activity. Resume home aspirin   Code Status: DNR Family Communication: d/w patient, RN (indicate person spoken with, relationship, and if by phone, the number) Disposition Plan: pend clinical improvement.    Consultants:  Surgery   Procedures: Retrorectus repair ventral incisional hernia with mesh  Antibiotics: Anti-infectives (From admission, onward)   Start     Dose/Rate Route Frequency Ordered Stop   05/19/18 1030  cefoTEtan (CEFOTAN) 2 g in sodium chloride 0.9 % 100 mL IVPB     2 g 200 mL/hr over 30 Minutes Intravenous On call to O.R. 05/18/18 1409 05/19/18 1307        (indicate start date, and stop date if known)  HPI/Subjective: No  distress. +flatus. No vomiting. Denies acute pains. No SOB  Objective: Vitals:   05/21/18 2157 05/22/18 0642  BP: (!) 141/57 (!) 158/81  Pulse: 72 68  Resp: 16 16  Temp: 98.2 F (36.8 C) 98.1 F (36.7 C)  SpO2: 95% 96%    Intake/Output Summary (Last 24 hours) at 05/22/2018 0735 Last data filed at 05/22/2018 0600 Gross per 24 hour  Intake 2871.33 ml  Output 1620 ml  Net 1251.33 ml   Filed Weights   05/18/18 0458  Weight: 67.6 kg    Exam:   General:  No distress   Cardiovascular: s1, s2 rrr  Respiratory: CTA BL  Abdomen: soft, mild post op tender   Musculoskeletal: no leg edema    Data Reviewed: Basic Metabolic Panel: Recent Labs  Lab 05/18/18 0002 05/18/18 0632 05/19/18 0503 05/20/18 0430  NA 138 137 140  139 141  K 4.1 3.8 3.2*  3.2* 4.1  CL 99 98 104  104 108  CO2 24 26 27  26 25   GLUCOSE 186* 170* 111*  111* 147*  BUN 27* 25* 20  19 21   CREATININE 0.92 0.82 0.87  0.83 0.92  CALCIUM 10.1 9.7 8.9  8.9 9.0  MG  --  1.5* 2.2 1.9  PHOS  --   --  2.4* 3.0   Liver Function Tests: Recent Labs  Lab 05/18/18 0002 05/18/18 0632 05/19/18 0503 05/20/18 0430  AST 27 26  --   --   ALT 18 20  --   --   ALKPHOS 68 69  --   --   BILITOT 0.7 0.6  --   --   PROT 6.4* 6.6  --   --   ALBUMIN  3.8 3.8 2.9* 2.8*   Recent Labs  Lab 05/18/18 0002  LIPASE 104*   No results for input(s): AMMONIA in the last 168 hours. CBC: Recent Labs  Lab 05/18/18 0002 05/18/18 0632 05/19/18 0503 05/20/18 0430  WBC 7.8 11.7* 8.1 12.2*  NEUTROABS 6.2 10.2* 5.7 10.3*  HGB 15.4* 15.6* 14.2 14.4  HCT 46.2* 46.7* 43.3 43.6  MCV 90.8 91.0 91.2 92.2  PLT 227 234 215 205   Cardiac Enzymes: Recent Labs  Lab 05/21/18 0432  TROPONINI <0.03   BNP (last 3 results) No results for input(s): BNP in the last 8760 hours.  ProBNP (last 3 results) No results for input(s): PROBNP in the last 8760 hours.  CBG: No results for input(s): GLUCAP in the last 168  hours.  Recent Results (from the past 240 hour(s))  MRSA PCR Screening     Status: None   Collection Time: 05/18/18 11:32 PM  Result Value Ref Range Status   MRSA by PCR NEGATIVE NEGATIVE Final    Comment:        The GeneXpert MRSA Assay (FDA approved for NASAL specimens only), is one component of a comprehensive MRSA colonization surveillance program. It is not intended to diagnose MRSA infection nor to guide or monitor treatment for MRSA infections. Performed at [facility], [place] [address], [place], [place] [address]      Studies: No results found.  Scheduled Meds: . acetaminophen  650 mg Oral Q6H  . enoxaparin (LOVENOX) injection  40 mg Subcutaneous Q24H   Continuous Infusions: . sodium chloride 75 mL/hr at 05/22/18 0600    Principal Problem:   SBO (small bowel obstruction) (HCC) Active Problems:   Hypertension   Incarcerated incisional hernia - reduced   Pressure injury of skin    Time spent: >35 minutes     Kinnie Feil  [facility] Pager [phone]. If 7PM-7AM, please contact night-coverage at [email or web address], password [email or web address] 05/22/2018, 7:35 AM  LOS: 4 days

## 2018-05-22 NOTE — Progress Notes (Signed)
Patient ID: [name], female   DOB: 1928/10/23, 84 y.o.   MRN: [id number]    3 Days Post-Op  Subjective: Patient states she had a good day yesterday and was up in a chair for 5 hours.  She states she doesn't feel well today.  She states her neck hurts from laying in the bed.  She tolerated clear liquids well.  No nausea.  Passing lots of flatus overnight.  Objective: Vital signs in last 24 hours: Temp:  [98 F (36.7 C)-98.2 F (36.8 C)] 98.1 F (36.7 C) (10/07 0642) Pulse Rate:  [68-72] 68 (10/07 0642) Resp:  [14-18] 16 (10/07 0642) BP: (136-178)/(57-81) 158/81 (10/07 0642) SpO2:  [95 %-96 %] 96 % (10/07 0642) Last BM Date: 05/18/18  Intake/Output from previous day: 10/06 0701 - 10/07 0700 In: 2871.3 [P.O.:1080; I.V.:1791.3] Out: 1620 [Urine:1575; Drains:45] Intake/Output this shift: No intake/output data recorded.  PE: Abd: soft, appropriately tender, great BS, incisions c/d/i, JP drains with minimal serous output. Left JP with 15cc, right JP with 30cc yesterday  Lab Results:  Recent Labs    05/20/18 0430  WBC 12.2*  HGB 14.4  HCT 43.6  PLT 205   BMET Recent Labs    05/20/18 0430  NA 141  K 4.1  CL 108  CO2 25  GLUCOSE 147*  BUN 21  CREATININE 0.92  CALCIUM 9.0   PT/INR No results for input(s): LABPROT, INR in the last 72 hours. CMP     Component Value Date/Time   NA 141 05/20/2018 0430   NA 139 09/07/2016 0958   K 4.1 05/20/2018 0430   K 3.9 09/07/2016 0958   CL 108 05/20/2018 0430   CO2 25 05/20/2018 0430   CO2 24 09/07/2016 0958   GLUCOSE 147 (H) 05/20/2018 0430   GLUCOSE 98 09/07/2016 0958   BUN 21 05/20/2018 0430   BUN 27.2 (H) 09/07/2016 0958   CREATININE 0.92 05/20/2018 0430   CREATININE 1.0 09/07/2016 0958   CALCIUM 9.0 05/20/2018 0430   CALCIUM 10.4 09/07/2016 0958   PROT 6.6 05/18/2018 0632   PROT 6.7 09/07/2016 0958   ALBUMIN 2.8 (L) 05/20/2018 0430   ALBUMIN 3.7 09/07/2016 0958   AST 26 05/18/2018 0632   AST 25 09/07/2016  0958   ALT 20 05/18/2018 0632   ALT 15 09/07/2016 0958   ALKPHOS 69 05/18/2018 0632   ALKPHOS 82 09/07/2016 0958   BILITOT 0.6 05/18/2018 0632   BILITOT 0.35 09/07/2016 0958   GFRNONAA 54 (L) 05/20/2018 0430   GFRAA >60 05/20/2018 0430   Lipase     Component Value Date/Time   LIPASE 104 (H) 05/18/2018 0002       Studies/Results: No results found.  Anti-infectives: Anti-infectives (From admission, onward)   Start     Dose/Rate Route Frequency Ordered Stop   05/19/18 1030  cefoTEtan (CEFOTAN) 2 g in sodium chloride 0.9 % 100 mL IVPB     2 g 200 mL/hr over 30 Minutes Intravenous On call to O.R. 05/18/18 1409 05/19/18 1307       Assessment/Plan History of coronary artery disease Hypertension GERD Hyperlipidemia Hypomagnesemia   Incarcerated incisional hernia with small bowel obstruction, POD 3, s/p Retrorectus repair ventral incisional hernia with mesh, Dr. Excell Seltzer 10-4 -doing well surgically -adv to full liquids -mobilize, PT consult -pulm toilet and IS -cont with abdominal binder    FEN: IV fluids/FLD ID: None DVT: SCDs/stable for chemical prophylaxis from surgical standpoint Follow-up: Dr. Excell Seltzer   LOS: 4 days  [name] , [facility] 05/22/2018, 9:22 AM Pager: [phone]

## 2018-05-22 NOTE — Care Management Important Message (Signed)
Important Message  Patient Details  Name: [name] MRN: [id number] Date of Birth: Dec 15, 1928   Medicare Important Message Given:  Yes    Kerin Salen 05/22/2018, 2:26 [place] Message  Patient Details  Name: [name] MRN: [id number] Date of Birth: [birth year]   Medicare Important Message Given:  Yes    Kerin Salen 05/22/2018, 2:25 PM

## 2018-05-23 MED ORDER — HYDRALAZINE HCL 10 MG PO TABS
10.0000 mg | ORAL_TABLET | Freq: Three times a day (TID) | ORAL | Status: DC
  Administered 2018-05-23 – 2018-05-25 (×7): 10 mg via ORAL
  Filled 2018-05-23 (×9): qty 1

## 2018-05-23 MED ORDER — JUVEN PO PACK
1.0000 | PACK | Freq: Two times a day (BID) | ORAL | Status: DC
  Administered 2018-05-23 – 2018-05-25 (×4): 1 via ORAL
  Filled 2018-05-23 (×5): qty 1

## 2018-05-23 MED ORDER — SODIUM CHLORIDE 0.9 % IV SOLN
INTRAVENOUS | Status: AC
  Administered 2018-05-23: 18:00:00 via INTRAVENOUS

## 2018-05-23 MED ORDER — ENSURE ENLIVE PO LIQD
237.0000 mL | Freq: Two times a day (BID) | ORAL | Status: DC
  Administered 2018-05-24 – 2018-05-25 (×4): 237 mL via ORAL

## 2018-05-23 NOTE — Clinical Social Work Note (Signed)
Clinical Social Work Assessment  Patient Details  Name: [name] MRN: [id number] Date of Birth: Nov 10, 1928  Date of referral:  05/23/18               Reason for consult:  Facility Placement, Discharge Planning                Permission sought to share information with:  Family Supports, Customer service manager Permission granted to share information::  Yes, Verbal Permission Granted  Name::       Staley,Chris  Agency::  [facility] Independent/ SNF   Relationship::  Son   Contact Information:    [phone]  [phone]     Housing/Transportation Living arrangements for the past 2 months:  [place] of Information:  Patient Patient Interpreter Needed:  None Criminal Activity/Legal Involvement Pertinent to Current Situation/Hospitalization:  No - Comment as needed Significant Relationships:  Adult Children Lives with:  Facility Resident Do you feel safe going back to the place where you live?  Yes Need for family participation in patient care:  No (Coment)  Care giving concerns:   Patient admitted for severe hiatal hernia as well as ventral abdominal wall hernia. Patient was admitted with small bowel obstruction with transition point in complex upper ventral abdominal wall hernia SNF placement for short rehab   Social Worker assessment / plan:  CSW met with the patient at bedside to discuss discharge plan. Patient understands she will return to [facility] for short rehab before returning to her independent living space there. CSW confirmed with Claiborne Billings the admitting coordinator they will accept the patient at discharge. Patient uses a walker to ambulate and has inquired if she can a have new one. CSW inform facility staff of the patient request.   Plan: SNF  fl2 Complete.  PASRR done.   Employment status:  Retired Nurse, adult PT Recommendations:  [place] / Referral to  community resources:  [place]  Patient/Family's Response to care:  Agreeable and Responding well to care.  Patient/Family's Understanding of and Emotional Response to Diagnosis, Current Treatment, and Prognosis:  Patient has a good understanding of her diagnosis and discharge plan. Patient is hopeful she will transition back to her independent living fairly quickly.   Patient reports her son is involved in her care but she prefers not to bother him when he is busy.   Emotional Assessment Appearance:  Appears stated age Attitude/Demeanor/Rapport:    Affect (typically observed):  Accepting, Pleasant Orientation:  Oriented to Self, Oriented to Place, Oriented to  Time, Oriented to Situation Alcohol / Substance use:  Not Applicable Psych involvement (Current and /or in the community):  No (Comment)  Discharge Needs  Concerns to be addressed:  Discharge Planning Concerns Readmission within the last 30 days:  No Current discharge risk:  Dependent with Mobility, Physical Impairment Barriers to Discharge:  Continued Medical Work up,    Lia Hopping, LCSW 05/23/2018, 12:24 PM

## 2018-05-23 NOTE — Progress Notes (Signed)
Patient ID: [name], female   DOB: 14-Aug-1929, 84 y.o.   MRN: [id number]    4 Days Post-Op  Subjective: Patient ambulated with nursing staff several times yesterday.  Tolerating her full liquids, but feels a little bloated.  Had some nausea this morning, but that was right when she woke up and she thinks it was due to sinus drainage.  Passing flatus still.  Objective: Vital signs in last 24 hours: Temp:  [98.3 F (36.8 C)-99.1 F (37.3 C)] 98.3 F (36.8 C) (10/08 0457) Pulse Rate:  [65-74] 74 (10/08 0457) Resp:  [14-15] 15 (10/07 2211) BP: (133-174)/(51-75) 163/74 (10/08 0457) SpO2:  [92 %-97 %] 92 % (10/08 0457) Last BM Date: 05/18/18  Intake/Output from previous day: 10/07 0701 - 10/08 0700 In: 1926.2 [P.O.:520; I.V.:1406.2] Out: 809 [Urine:775; Drains:34] Intake/Output this shift: No intake/output data recorded.  PE: Abd: soft, some bloating, more so than yesterday, but still with really great BS, incisions c/d/i, JP drains with minimal serosang output.    Lab Results:  No results for input(s): WBC, HGB, HCT, PLT in the last 72 hours. BMET No results for input(s): NA, K, CL, CO2, GLUCOSE, BUN, CREATININE, CALCIUM in the last 72 hours. PT/INR No results for input(s): LABPROT, INR in the last 72 hours. CMP     Component Value Date/Time   NA 141 05/20/2018 0430   NA 139 09/07/2016 0958   K 4.1 05/20/2018 0430   K 3.9 09/07/2016 0958   CL 108 05/20/2018 0430   CO2 25 05/20/2018 0430   CO2 24 09/07/2016 0958   GLUCOSE 147 (H) 05/20/2018 0430   GLUCOSE 98 09/07/2016 0958   BUN 21 05/20/2018 0430   BUN 27.2 (H) 09/07/2016 0958   CREATININE 0.92 05/20/2018 0430   CREATININE 1.0 09/07/2016 0958   CALCIUM 9.0 05/20/2018 0430   CALCIUM 10.4 09/07/2016 0958   PROT 6.6 05/18/2018 0632   PROT 6.7 09/07/2016 0958   ALBUMIN 2.8 (L) 05/20/2018 0430   ALBUMIN 3.7 09/07/2016 0958   AST 26 05/18/2018 0632   AST 25 09/07/2016 0958   ALT 20 05/18/2018 0632   ALT 15  09/07/2016 0958   ALKPHOS 69 05/18/2018 0632   ALKPHOS 82 09/07/2016 0958   BILITOT 0.6 05/18/2018 0632   BILITOT 0.35 09/07/2016 0958   GFRNONAA 54 (L) 05/20/2018 0430   GFRAA >60 05/20/2018 0430   Lipase     Component Value Date/Time   LIPASE 104 (H) 05/18/2018 0002       Studies/Results: No results found.  Anti-infectives: Anti-infectives (From admission, onward)   Start     Dose/Rate Route Frequency Ordered Stop   05/19/18 1030  cefoTEtan (CEFOTAN) 2 g in sodium chloride 0.9 % 100 mL IVPB     2 g 200 mL/hr over 30 Minutes Intravenous On call to O.R. 05/18/18 1409 05/19/18 1307       Assessment/Plan History of coronary artery disease Hypertension GERD Hyperlipidemia Hypomagnesemia   Incarcerated incisional hernia with small bowel obstruction, POD 4, s/p Retrorectus repair ventral incisional hernia with mesh, Dr. Excell Seltzer 10-4 -doing well surgically -keep on full liquids as she is a little bloated and doesn't feel quite ready for solid food. -Ensure added for supplementation -mobilize, PT recommended SNF placement -pulm toilet and IS -cont with abdominal binder  FEN: IV fluids/FLD/Ensure ID: None DVT: SCDs/Lovenox Follow-up: Dr. Excell Seltzer   LOS: 5 days    [name] , [facility] 05/23/2018, 8:13 AM Pager: [phone]

## 2018-05-23 NOTE — Progress Notes (Addendum)
[facility] PROGRESS NOTE  BRAELEY BUSKEY HKV:[id number] DOB: 20-Feb-1929 DOA: 05/17/2018 PCP: Lajean Manes, MD  Brief summary   84 year old female, with past medical history significant for coronary artery disease, hypertension and hyperlipidemia. Patient has severe hiatal hernia as well as ventral abdominal wall hernia. Patient was admitted with small bowel obstruction with transition point in complex upper ventral abdominal wall hernia. EKG reveals possible LVH.  Patient reported being fairly active prior to admission.  No limitations in activity, and no associated chest pain or shortness of breath with activity.  Prior history of coronary artery disease documented, but patient could not remember details.   05/19/2018: Patient underwent surgery.      Assessment/Plan:  Small bowel obstruction: Transition point seen involving the ventral hernia. Initially, on NG tube suction 05/19/2018:  Patient underwent surgery: Retrorectus repair ventral incisional hernia with mesh. Surgical input is highly appreciated.  cont further management as per surgical team.  Hypokalemia: Repleted. Monitor  Hypomagnesemia: Repleted. Hypertension: home regimen hctz. Will resume when able to take enough PO. Started on hydralazine. Monitor  History of CAD: Asymptomatic. No limitation with activity. Resume home aspirin   Code Status: DNR Family Communication: d/w patient, RN (indicate person spoken with, relationship, and if by phone, the number) Disposition Plan: pend clinical improvement. SNF    Consultants:  Surgery   Procedures: Retrorectus repair ventral incisional hernia with mesh  Antibiotics: Anti-infectives (From admission, onward)   Start     Dose/Rate Route Frequency Ordered Stop   05/19/18 1030  cefoTEtan (CEFOTAN) 2 g in sodium chloride 0.9 % 100 mL IVPB     2 g 200 mL/hr over 30 Minutes Intravenous On call to O.R. 05/18/18 1409 05/19/18 1307       (indicate start date, and stop  date if known)  HPI/Subjective: No distress. +flatus. Feels nauseated, No vomiting. Denies acute pains. No SOB  Objective: Vitals:   05/23/18 0824 05/23/18 0932  BP: (!) 174/68 (!) 150/61  Pulse:    Resp:    Temp:    SpO2:      Intake/Output Summary (Last 24 hours) at 05/23/2018 1004 Last data filed at 05/23/2018 0841 Gross per 24 hour  Intake 1627.46 ml  Output 415 ml  Net 1212.46 ml   Filed Weights   05/18/18 0458  Weight: 67.6 kg    Exam:   General:  No distress   Cardiovascular: s1, s2 rrr  Respiratory: CTA BL  Abdomen: soft, mild post op tender   Musculoskeletal: no leg edema    Data Reviewed: Basic Metabolic Panel: Recent Labs  Lab 05/18/18 0002 05/18/18 0632 05/19/18 0503 05/20/18 0430  NA 138 137 140  139 141  K 4.1 3.8 3.2*  3.2* 4.1  CL 99 98 104  104 108  CO2 24 26 27  26 25   GLUCOSE 186* 170* 111*  111* 147*  BUN 27* 25* 20  19 21   CREATININE 0.92 0.82 0.87  0.83 0.92  CALCIUM 10.1 9.7 8.9  8.9 9.0  MG  --  1.5* 2.2 1.9  PHOS  --   --  2.4* 3.0   Liver Function Tests: Recent Labs  Lab 05/18/18 0002 05/18/18 0632 05/19/18 0503 05/20/18 0430  AST 27 26  --   --   ALT 18 20  --   --   ALKPHOS 68 69  --   --   BILITOT 0.7 0.6  --   --   PROT 6.4* 6.6  --   --  ALBUMIN 3.8 3.8 2.9* 2.8*   Recent Labs  Lab 05/18/18 0002  LIPASE 104*   No results for input(s): AMMONIA in the last 168 hours. CBC: Recent Labs  Lab 05/18/18 0002 05/18/18 0632 05/19/18 0503 05/20/18 0430  WBC 7.8 11.7* 8.1 12.2*  NEUTROABS 6.2 10.2* 5.7 10.3*  HGB 15.4* 15.6* 14.2 14.4  HCT 46.2* 46.7* 43.3 43.6  MCV 90.8 91.0 91.2 92.2  PLT 227 234 215 205   Cardiac Enzymes: Recent Labs  Lab 05/21/18 0432  TROPONINI <0.03   BNP (last 3 results) No results for input(s): BNP in the last 8760 hours.  ProBNP (last 3 results) No results for input(s): PROBNP in the last 8760 hours.  CBG: No results for input(s): GLUCAP in the last 168  hours.  Recent Results (from the past 240 hour(s))  MRSA PCR Screening     Status: None   Collection Time: 05/18/18 11:32 PM  Result Value Ref Range Status   MRSA by PCR NEGATIVE NEGATIVE Final    Comment:        The GeneXpert MRSA Assay (FDA approved for NASAL specimens only), is one component of a comprehensive MRSA colonization surveillance program. It is not intended to diagnose MRSA infection nor to guide or monitor treatment for MRSA infections. Performed at [facility], [place] [address], [place], [place] [address]      Studies: No results found.  Scheduled Meds: . acetaminophen  650 mg Oral Q6H  . enoxaparin (LOVENOX) injection  40 mg Subcutaneous Q24H  . feeding supplement (ENSURE ENLIVE)  237 mL Oral BID BM  . hydrALAZINE  10 mg Oral Q8H   Continuous Infusions:   Principal Problem:   SBO (small bowel obstruction) (HCC) Active Problems:   Hypertension   Incarcerated incisional hernia - reduced   Pressure injury of skin    Time spent: >35 minutes     Kinnie Feil  [facility] Pager [phone]. If 7PM-7AM, please contact night-coverage at [email or web address], password [facility] 05/23/2018, 10:04 AM  LOS: 5 days    Addendum: Stage 2 pressure injury of the left buttocks. Ambulate as able, cont wound care, pressure relieve, position changes to avoid decub.  Kinnie Feil

## 2018-05-23 NOTE — Progress Notes (Signed)
Initial Nutrition Assessment  INTERVENTION:   -Continue Ensure Enlive po BID, each supplement provides 350 kcal and 20 grams of protein -Provide Juven Fruit Punch BID, each serving provides 95kcal and 2.5g of protein (amino acids glutamine and arginine)  NUTRITION DIAGNOSIS:   Increased nutrient needs related to wound healing as evidenced by estimated needs.  GOAL:   Patient will meet greater than or equal to 90% of their needs  MONITOR:   PO intake, Supplement acceptance, Skin, I & O's, Labs, Weight trends  REASON FOR ASSESSMENT:   (Wound report)    ASSESSMENT:   84 year old female, with past medical history significant for coronary artery disease, hypertension and hyperlipidemia.  Patient has severe hiatal hernia as well as ventral abdominal wall hernia.  Patient was admitted with small bowel obstruction with transition point in complex upper ventral abdominal wall hernia. 10/4: s/p HERNIA REPAIR VENTRAL ADULT (N/A Abdomen) INSERTION OF MESH (Abdomen)  Patient currently on a liquid diet, consuming 50-100% of trays. Pt has been ordered Ensure supplements but has not drank one yet. RD to order Juven supplements in addition to aid in wound healing. Pt as having N/V prior to admission 10/2 and surgery. Pt is now tolerating diet.   Per weight records, pt has lost 18 lb since November 2018 (11% wt loss x 10 months, insignificant for time frame).   Medications: Zofran tablet PRN Labs reviewed: Mg/Phos WNL   NUTRITION - FOCUSED PHYSICAL EXAM:  Nutrition focused physical exam shows no sign of depletion of muscle mass or body fat.  Diet Order:   Diet Order            Diet full liquid Room service appropriate? Yes; Fluid consistency: Thin  Diet effective now              EDUCATION NEEDS:   No education needs have been identified at this time  Skin:  Skin Assessment: Skin Integrity Issues: Skin Integrity Issues:: Stage II Stage II: buttocks  Last BM:   10/3  Height:   Ht Readings from Last 1 Encounters:  05/18/18 5\' 3"  (1.6 m)    Weight:   Wt Readings from Last 1 Encounters:  05/18/18 67.6 kg    Ideal Body Weight:  52.3 kg  BMI:  Body mass index is 26.39 kg/m.  Estimated Nutritional Needs:   Kcal:  1650-1850  Protein:  75-85g  Fluid:  1.8L/day  Clayton Bibles, MS, RD, LDN [place] Dietitian Pager: [phone] After Hours Pager: [phone]

## 2018-05-23 NOTE — Progress Notes (Signed)
OT Cancellation Note  Patient Details Name: [name] MRN: [id number] DOB: 1929/08/02   Cancelled Treatment:    Reason Eval/Treat Not Completed: Fatigue/lethargy limiting ability to participate  Pt had just gotten back to bed after being OOB and walking.  Will check on pt later in day or next day. Kari Baars, OT Acute Rehabilitation Services [phone] Office- [phone], Edwena Felty D 05/23/2018, 12:32 PM

## 2018-05-24 MED ORDER — OXYCODONE HCL 5 MG PO TABS: 5.0000 mg | ORAL_TABLET | ORAL | Status: DC | PRN

## 2018-05-24 MED ORDER — POLYETHYLENE GLYCOL 3350 17 G PO PACK
17.0000 g | PACK | Freq: Every day | ORAL | Status: DC
  Administered 2018-05-24 – 2018-05-25 (×2): 17 g via ORAL
  Filled 2018-05-24 (×2): qty 1

## 2018-05-24 NOTE — Progress Notes (Signed)
PROGRESS NOTE    [name]  GEX:[id number] DOB: 16-Dec-1928 DOA: 05/17/2018 PCP: Lajean Manes, MD    Brief Narrative:84 year old female, with past medical history significant for coronary artery disease, hypertension and hyperlipidemia. Patient has severe hiatal hernia as well as ventral abdominal wall hernia. Patient was admitted with small bowel obstruction with transition point in complex upper ventral abdominal wall hernia.    05/19/2018: Patient underwent ventral incisional hernia repair by Dr. Excell Seltzer and she had 2 JP drains placed.  Assessment & Plan:   Principal Problem:   SBO (small bowel obstruction) (HCC) Active Problems:   Hypertension   Incarcerated incisional hernia - reduced   Pressure injury of skin   Small bowel obstruction from ventral hernia status post hernia repair with mesh by Dr. Excell Seltzer. Status post 2 JP drains placed. Started on clear liquid diet and advanced as tolerated.  Patient is currently on soft diet. She is able to pass flatus but she did not have a bowel movement yet. Her abdominal distention has improved, no nausea or vomiting.   Hypo-kalemia, hypomagnesemia and hypophosphatemia Replaced   History of coronary artery disease Patient currently denies any chest pain or shortness of breath.   Hypertension Suboptimally controlled, PRN hydralazine on board resume home meds on discharge. Stopped IV fluids.    DVT prophylaxis: Lovenox Code Status: DNR Family Communication: None at bedside  disposition Plan: SNF on discharge  Consultants:   Surgery   Procedures: s/pRetrorectus repair ventral incisional hernia with mesh, Dr. Excell Seltzer 10-4,   status post 2 JP drains   Antimicrobials:    Subjective: Passing flatus, no bowel movement yet.  No abdominal pain no nausea or vomiting, abdominal distention is improving.  Objective: Vitals:   05/23/18 0932 05/23/18 1417 05/23/18 2244 05/24/18 0600  BP: (!) 150/61 (!) 152/68  (!) 183/69 (!) 164/66  Pulse:   74 70  Resp:   16 15  Temp:  98.9 F (37.2 C) 98.7 F (37.1 C) 98.1 F (36.7 C)  TempSrc:  Oral Oral Oral  SpO2:   97% 96%  Weight:      Height:        Intake/Output Summary (Last 24 hours) at 05/24/2018 1339 Last data filed at 05/24/2018 1057 Gross per 24 hour  Intake 1259.27 ml  Output 645 ml  Net 614.27 ml   Filed Weights   05/18/18 0458  Weight: 67.6 kg    Examination:  General exam: Appears calm and comfortable  Respiratory system: Clear to auscultation. Respiratory effort normal. Cardiovascular system: S1 & S2 heard, RRR. No JVD, . No pedal edema. Gastrointestinal system: Abdomen is soft distended, bowel sounds okay, JP drains in place with serosanguinous fluid.  Central nervous system: Alert and oriented to place and person. Non focal.  Extremities: Symmetric 5 x 5 power. Skin: No rashes, lesions or ulcers Psychiatry:  Mood & affect appropriate.     Data Reviewed: I have personally reviewed following labs and imaging studies  CBC: Recent Labs  Lab 05/18/18 0002 05/18/18 0632 05/19/18 0503 05/20/18 0430  WBC 7.8 11.7* 8.1 12.2*  NEUTROABS 6.2 10.2* 5.7 10.3*  HGB 15.4* 15.6* 14.2 14.4  HCT 46.2* 46.7* 43.3 43.6  MCV 90.8 91.0 91.2 92.2  PLT 227 234 215 010   Basic Metabolic Panel: Recent Labs  Lab 05/18/18 0002 05/18/18 0632 05/19/18 0503 05/20/18 0430  NA 138 137 140  139 141  K 4.1 3.8 3.2*  3.2* 4.1  CL 99 98 104  104 108  CO2 24 26 27  26 25   GLUCOSE 186* 170* 111*  111* 147*  BUN 27* 25* 20  19 21   CREATININE 0.92 0.82 0.87  0.83 0.92  CALCIUM 10.1 9.7 8.9  8.9 9.0  MG  --  1.5* 2.2 1.9  PHOS  --   --  2.4* 3.0   GFR: Estimated Creatinine Clearance: 39.8 mL/min (by C-G formula based on SCr of 0.92 mg/dL). Liver Function Tests: Recent Labs  Lab 05/18/18 0002 05/18/18 0632 05/19/18 0503 05/20/18 0430  AST 27 26  --   --   ALT 18 20  --   --   ALKPHOS 68 69  --   --   BILITOT 0.7 0.6  --    --   PROT 6.4* 6.6  --   --   ALBUMIN 3.8 3.8 2.9* 2.8*   Recent Labs  Lab 05/18/18 0002  LIPASE 104*   No results for input(s): AMMONIA in the last 168 hours. Coagulation Profile: Recent Labs  Lab 05/19/18 0503  INR 0.96   Cardiac Enzymes: Recent Labs  Lab 05/21/18 0432  TROPONINI <0.03   BNP (last 3 results) No results for input(s): PROBNP in the last 8760 hours. HbA1C: No results for input(s): HGBA1C in the last 72 hours. CBG: No results for input(s): GLUCAP in the last 168 hours. Lipid Profile: No results for input(s): CHOL, HDL, LDLCALC, TRIG, CHOLHDL, LDLDIRECT in the last 72 hours. Thyroid Function Tests: No results for input(s): TSH, T4TOTAL, FREET4, T3FREE, THYROIDAB in the last 72 hours. Anemia Panel: No results for input(s): VITAMINB12, FOLATE, FERRITIN, TIBC, IRON, RETICCTPCT in the last 72 hours. Sepsis Labs: Recent Labs  Lab 05/18/18 1020  LATICACIDVEN 1.9    Recent Results (from the past 240 hour(s))  MRSA PCR Screening     Status: None   Collection Time: 05/18/18 11:32 PM  Result Value Ref Range Status   MRSA by PCR NEGATIVE NEGATIVE Final    Comment:        The GeneXpert MRSA Assay (FDA approved for NASAL specimens only), is one component of a comprehensive MRSA colonization surveillance program. It is not intended to diagnose MRSA infection nor to guide or monitor treatment for MRSA infections. Performed at [facility], [place] [address] [place], [place] [address]          Radiology Studies: No results found.      Scheduled Meds: . acetaminophen  650 mg Oral Q6H  . enoxaparin (LOVENOX) injection  40 mg Subcutaneous Q24H  . feeding supplement (ENSURE ENLIVE)  237 mL Oral BID BM  . hydrALAZINE  10 mg Oral Q8H  . nutrition supplement (JUVEN)  1 packet Oral BID BM  . polyethylene glycol  17 g Oral Daily   Continuous Infusions: . sodium chloride 50 mL/hr at 05/23/18 1821     LOS: 6 days    Time  spent: 35 minutes.     Hosie Poisson, MD [facility] Pager [phone]  If 7PM-7AM, please contact night-coverage [email or web address] Password [email or web address] 05/24/2018, 1:39 PM

## 2018-05-24 NOTE — Progress Notes (Signed)
OT Cancellation Note  Patient Details Name: [name] MRN: [id number] DOB: 07-16-1929   Cancelled Treatment:    Reason Eval/Treat Not Completed: Other (comment). Pt just got back to bed. Will check back either later today or in the morning.  SPENCER,MARYELLEN 05/24/2018, 1:34 PM  Lesle Chris, OTR/L Acute Rehabilitation Services [phone] [facility] pager [phone] office 05/24/2018

## 2018-05-24 NOTE — Progress Notes (Signed)
Patient ID: [name], female   DOB: [birth year], 84 y.o.   MRN: [id number]    5 Days Post-Op  Subjective: Pt sitting up in a chair and has just finished her full liquid breakfast.  Still with flatus.  No BM yet.  No nausea  Objective: Vital signs in last 24 hours: Temp:  [98.1 F (36.7 C)-98.9 F (37.2 C)] 98.1 F (36.7 C) (10/09 0600) Pulse Rate:  [70-74] 70 (10/09 0600) Resp:  [15-16] 15 (10/09 0600) BP: (152-183)/(66-69) 164/66 (10/09 0600) SpO2:  [96 %-97 %] 96 % (10/09 0600) Last BM Date: 05/18/18  Intake/Output from previous day: 10/08 0701 - 10/09 0700 In: 1296 [P.O.:120; I.V.:1176] Out: 748 [Urine:720; Drains:28] Intake/Output this shift: No intake/output data recorded.  PE: Abd: soft, sitting up in chair, +BS, wounds are all c/d/i. JPs still with minimal serosang output.  Left with 18cc and right with 10cc  Lab Results:  No results for input(s): WBC, HGB, HCT, PLT in the last 72 hours. BMET No results for input(s): NA, K, CL, CO2, GLUCOSE, BUN, CREATININE, CALCIUM in the last 72 hours. PT/INR No results for input(s): LABPROT, INR in the last 72 hours. CMP     Component Value Date/Time   NA 141 05/20/2018 0430   NA 139 09/07/2016 0958   K 4.1 05/20/2018 0430   K 3.9 09/07/2016 0958   CL 108 05/20/2018 0430   CO2 25 05/20/2018 0430   CO2 24 09/07/2016 0958   GLUCOSE 147 (H) 05/20/2018 0430   GLUCOSE 98 09/07/2016 0958   BUN 21 05/20/2018 0430   BUN 27.2 (H) 09/07/2016 0958   CREATININE 0.92 05/20/2018 0430   CREATININE 1.0 09/07/2016 0958   CALCIUM 9.0 05/20/2018 0430   CALCIUM 10.4 09/07/2016 0958   PROT 6.6 05/18/2018 0632   PROT 6.7 09/07/2016 0958   ALBUMIN 2.8 (L) 05/20/2018 0430   ALBUMIN 3.7 09/07/2016 0958   AST 26 05/18/2018 0632   AST 25 09/07/2016 0958   ALT 20 05/18/2018 0632   ALT 15 09/07/2016 0958   ALKPHOS 69 05/18/2018 0632   ALKPHOS 82 09/07/2016 0958   BILITOT 0.6 05/18/2018 0632   BILITOT 0.35 09/07/2016 0958   GFRNONAA  54 (L) 05/20/2018 0430   GFRAA >60 05/20/2018 0430   Lipase     Component Value Date/Time   LIPASE 104 (H) 05/18/2018 0002       Studies/Results: No results found.  Anti-infectives: Anti-infectives (From admission, onward)   Start     Dose/Rate Route Frequency Ordered Stop   05/19/18 1030  cefoTEtan (CEFOTAN) 2 g in sodium chloride 0.9 % 100 mL IVPB     2 g 200 mL/hr over 30 Minutes Intravenous On call to O.R. 05/18/18 1409 05/19/18 1307       Assessment/Plan History of coronary artery disease Hypertension GERD Hyperlipidemia Hypomagnesemia   Incarcerated incisional hernia with small bowel obstruction, POD 5, s/pRetrorectus repair ventral incisional hernia with mesh, Dr. Excell Seltzer 10-4 -doing well surgically -adv to soft diet -Ensure added for supplementation -mobilize, PT recommended SNF placement -pulm toilet and IS -cont with abdominal binder -will D/W MD when JP Drains can be pulled -add miralax  FEN: IV fluids/soft diet/Ensure ID: None DVT: SCDs/Lovenox Follow-up:Dr. Hoxworth   LOS: 6 days    [name] , [facility] Surgery 05/24/2018, 10:09 AM Pager: [phone]

## 2018-05-24 NOTE — Progress Notes (Signed)
Physical Therapy Treatment Patient Details Name: [name] MRN: [id number] DOB: 06/11/1929 Today's Date: 05/24/2018    History of Present Illness Patient is an 84 y/o admitted with Incarcerated incisional hernia with small bowel obstruction,  s/p Retrorectus repair ventral incisional hernia with mesh, Dr. Excell Seltzer 10-4.  PMH positive for arthritis and HTN and prior abdominal surgery, knee surgery, back and shoulder surgery.    PT Comments    Pt is progressing toward PT goals, incr activity/gait  tol today; continue PT POC  Follow Up Recommendations  SNF;Supervision/Assistance - 24 hour     Equipment Recommendations       Recommendations for Other Services OT consult     Precautions / Restrictions Precautions Precautions: Fall Other Brace/Splint: abdominal binder Restrictions Weight Bearing Restrictions: No    Mobility  Bed Mobility Overal bed mobility: Needs Assistance Bed Mobility: Sit to Sidelying         Sit to sidelying: Mod assist General bed mobility comments: cues for technique, assist for legs onto bed  Transfers Overall transfer level: Needs assistance Equipment used: Rolling walker (2 wheeled) Transfers: Sit to/from Stand Sit to Stand: Min guard         General transfer comment: cues for hand placement and control of descent  Ambulation/Gait Ambulation/Gait assistance: Min guard;Supervision Gait Distance (Feet): 100 Feet Assistive device: Rolling walker (2 wheeled) Gait Pattern/deviations: Step-through pattern;Step-to pattern;Trunk flexed;Decreased stride length     General Gait Details: slow pace, cues for incr trunk extension as tolerated   Stairs             Wheelchair Mobility    Modified Rankin (Stroke Patients Only)       Balance             Standing balance-Leahy Scale: Poor Standing balance comment: UE support needed for balance                            Cognition Arousal/Alertness:  Awake/alert Behavior During Therapy: WFL for tasks assessed/performed Overall Cognitive Status: Within Functional Limits for tasks assessed                                        Exercises General Exercises - Lower Extremity Ankle Circles/Pumps: AROM;Both;10 reps Quad Sets: AROM;Both;5 reps Gluteal Sets: AROM;Both;5 reps    General Comments        Pertinent Vitals/Pain Pain Assessment: Faces Faces Pain Scale: Hurts a little bit Pain Location: abdomen with mobility Pain Descriptors / Indicators: Operative site guarding;Grimacing Pain Intervention(s): Monitored during session    Home Living                      Prior Function            PT Goals (current goals can now be found in the care plan section) Acute Rehab PT Goals Patient Stated Goal: to return to independent after rehab stay PT Goal Formulation: With patient Time For Goal Achievement: 06/05/18 Potential to Achieve Goals: Good Progress towards PT goals: Progressing toward goals    Frequency    Min 3X/week      PT Plan Current plan remains appropriate    Co-evaluation              AM-PAC PT "6 Clicks" Daily Activity  Outcome Measure  Difficulty turning over in bed (including  adjusting bedclothes, sheets and blankets)?: Unable Difficulty moving from lying on back to sitting on the side of the bed? : Unable Difficulty sitting down on and standing up from a chair with arms (e.g., wheelchair, bedside commode, etc,.)?: Unable Help needed moving to and from a bed to chair (including a wheelchair)?: A Little Help needed walking in hospital room?: A Little Help needed climbing 3-5 steps with a railing? : A Little 6 Click Score: 12    End of Session   Activity Tolerance: Patient tolerated treatment well Patient left: in bed;with call bell/phone within reach;with bed alarm set   PT Visit Diagnosis: Other abnormalities of gait and mobility (R26.89);Muscle weakness  (generalized) (M62.81)     Time: 7673-4193 PT Time Calculation (min) (ACUTE ONLY): 18 min  Charges:  $Gait Training: 8-22 mins                     Kenyon Ana, PT Pager: [phone] 05/24/2018   [facility] [phone]    [facility] 05/24/2018, 11:12 AM

## 2018-05-24 NOTE — Progress Notes (Signed)
OT Cancellation Note  Patient Details Name: [name] MRN: [id number] DOB: 1928/10/29   Cancelled Treatment:    Reason Eval/Treat Not Completed: Other (comment). Pt fatiqued. Will check back tomorrow  SPENCER,MARYELLEN 05/24/2018, 3:06 PM  Lesle Chris, OTR/L Acute Rehabilitation Services [phone] [facility] pager [phone] office 05/24/2018

## 2018-05-25 DIAGNOSIS — K5900 Constipation, unspecified: Secondary | ICD-10-CM | POA: Diagnosis not present

## 2018-05-25 DIAGNOSIS — E568 Deficiency of other vitamins: Secondary | ICD-10-CM | POA: Diagnosis not present

## 2018-05-25 DIAGNOSIS — Z9981 Dependence on supplemental oxygen: Secondary | ICD-10-CM | POA: Diagnosis not present

## 2018-05-25 DIAGNOSIS — R112 Nausea with vomiting, unspecified: Secondary | ICD-10-CM | POA: Diagnosis not present

## 2018-05-25 DIAGNOSIS — E785 Hyperlipidemia, unspecified: Secondary | ICD-10-CM | POA: Diagnosis not present

## 2018-05-25 DIAGNOSIS — Z7409 Other reduced mobility: Secondary | ICD-10-CM | POA: Diagnosis not present

## 2018-05-25 DIAGNOSIS — H2589 Other age-related cataract: Secondary | ICD-10-CM | POA: Diagnosis not present

## 2018-05-25 DIAGNOSIS — Z743 Need for continuous supervision: Secondary | ICD-10-CM | POA: Diagnosis not present

## 2018-05-25 DIAGNOSIS — M6281 Muscle weakness (generalized): Secondary | ICD-10-CM | POA: Diagnosis not present

## 2018-05-25 DIAGNOSIS — F419 Anxiety disorder, unspecified: Secondary | ICD-10-CM | POA: Diagnosis not present

## 2018-05-25 DIAGNOSIS — R279 Unspecified lack of coordination: Secondary | ICD-10-CM | POA: Diagnosis not present

## 2018-05-25 DIAGNOSIS — I2510 Atherosclerotic heart disease of native coronary artery without angina pectoris: Secondary | ICD-10-CM | POA: Diagnosis not present

## 2018-05-25 DIAGNOSIS — R2689 Other abnormalities of gait and mobility: Secondary | ICD-10-CM | POA: Diagnosis not present

## 2018-05-25 DIAGNOSIS — M1990 Unspecified osteoarthritis, unspecified site: Secondary | ICD-10-CM | POA: Diagnosis not present

## 2018-05-25 DIAGNOSIS — E569 Vitamin deficiency, unspecified: Secondary | ICD-10-CM | POA: Diagnosis not present

## 2018-05-25 DIAGNOSIS — K56609 Unspecified intestinal obstruction, unspecified as to partial versus complete obstruction: Secondary | ICD-10-CM | POA: Diagnosis not present

## 2018-05-25 DIAGNOSIS — Z48815 Encounter for surgical aftercare following surgery on the digestive system: Secondary | ICD-10-CM | POA: Diagnosis not present

## 2018-05-25 DIAGNOSIS — K430 Incisional hernia with obstruction, without gangrene: Secondary | ICD-10-CM | POA: Diagnosis not present

## 2018-05-25 DIAGNOSIS — E876 Hypokalemia: Secondary | ICD-10-CM | POA: Diagnosis not present

## 2018-05-25 DIAGNOSIS — R110 Nausea: Secondary | ICD-10-CM | POA: Diagnosis not present

## 2018-05-25 DIAGNOSIS — Z9841 Cataract extraction status, right eye: Secondary | ICD-10-CM | POA: Diagnosis not present

## 2018-05-25 DIAGNOSIS — I10 Essential (primary) hypertension: Secondary | ICD-10-CM | POA: Diagnosis not present

## 2018-05-25 DIAGNOSIS — R451 Restlessness and agitation: Secondary | ICD-10-CM | POA: Diagnosis not present

## 2018-05-25 DIAGNOSIS — H04123 Dry eye syndrome of bilateral lacrimal glands: Secondary | ICD-10-CM | POA: Diagnosis not present

## 2018-05-25 MED ORDER — JUVEN PO PACK: 1.0000 | PACK | Freq: Two times a day (BID) | ORAL | 0 refills | Status: DC

## 2018-05-25 MED ORDER — HYDRALAZINE HCL 10 MG PO TABS: 10.0000 mg | ORAL_TABLET | Freq: Three times a day (TID) | ORAL | 0 refills | Status: DC

## 2018-05-25 MED ORDER — TRAMADOL HCL 50 MG PO TABS: 50.0000 mg | ORAL_TABLET | Freq: Four times a day (QID) | ORAL | 0 refills | Status: DC | PRN

## 2018-05-25 MED ORDER — HYDRALAZINE HCL 50 MG PO TABS
50.0000 mg | ORAL_TABLET | Freq: Once | ORAL | Status: AC
  Administered 2018-05-25: 50 mg via ORAL
  Filled 2018-05-25: qty 1

## 2018-05-25 MED ORDER — ENSURE ENLIVE PO LIQD: 237.0000 mL | Freq: Two times a day (BID) | ORAL | 12 refills | Status: DC

## 2018-05-25 NOTE — Progress Notes (Signed)
D/C Summary sent.  Nurse call report to: [phone] [facility] arranged for transport.   Kathrin Greathouse, Marlinda Mike, MSW Clinical Social Worker  [phone] 05/25/2018  2:16 PM

## 2018-05-25 NOTE — Discharge Summary (Signed)
Physician Discharge Summary  [name] QMG:[id number] DOB: 06-Aug-1929 DOA: 05/17/2018  PCP: Lajean Manes, MD  Admit date: 05/17/2018 Discharge date: 05/25/2018  Admitted From: Home Disposition:  SNF   Recommendations for Outpatient Follow-up:  1. Follow up with PCP in 1-2 weeks 2. Please obtain BMP/CBC in one week 3. Please follow up with surgery as recommended.  4. Please remove the staples on Monday 10/14.     Discharge Condition:stable.  CODE STATUS:DNR Diet recommendation: Heart Healthy    Brief/Interim Summary: 84 year old female, with past medical history significant for coronary artery disease, hypertension and hyperlipidemia. Patient has severe hiatal hernia as well as ventral abdominal wall hernia. Patient was admitted with small bowel obstruction with transition point in complex upper ventral abdominal wall hernia.    05/19/2018: Patient underwent ventral incisional hernia repair by Dr. Excell Seltzer and she had 2 JP drains placed.  JP drains will be removed today. And plan for d.c to SNF when bed available.   Discharge Diagnoses:  Principal Problem:   SBO (small bowel obstruction) (HCC) Active Problems:   Hypertension   Incarcerated incisional hernia - reduced   Small bowel obstruction from ventral hernia status post hernia repair with mesh by Dr. Excell Seltzer. Status post 2 JP drains placed. Started on clear liquid diet and advanced as tolerated.  Patient is currently on soft diet able to tolerate without nausea and vomiting. abd pain controlled. She had one BM yesterday and she was cleared by surgery for discharge.   Hypo-kalemia, hypomagnesemia and hypophosphatemia Replaced   History of coronary artery disease Patient currently denies any chest pain or shortness of breath. Resume aspirin on discharge.    Hypertension Restart HCTZ along with hydralazine on discharge.    Discharge Instructions  Discharge Instructions    Diet - low sodium  heart healthy   Complete by:  As directed    Discharge instructions   Complete by:  As directed    Please follow up with surgery as recommended.     Allergies as of 05/25/2018      Reactions   Felodipine Anaphylaxis, Other (See Comments)   Reaction: unknown possible nausea or rash   Prednisone    Shrimp [shellfish Allergy] Nausea And Vomiting   Statins Other (See Comments)   Muscle/leg pain.   Omeprazole Other (See Comments)   Reaction: unknown possible nausea or rash      Medication List    STOP taking these medications   diclofenac sodium 1 % Gel Commonly known as:  VOLTAREN     TAKE these medications   ALPRAZolam 0.5 MG tablet Commonly known as:  XANAX Take 0.25-0.5 mg by mouth at bedtime as needed for sleep. Reported on 09/07/2015   aspirin EC 81 MG tablet Take 81 mg by mouth daily.   feeding supplement (ENSURE ENLIVE) Liqd Take 237 mLs by mouth 2 (two) times daily between meals.   nutrition supplement (JUVEN) Pack Take 1 packet by mouth 2 (two) times daily between meals.   hydrALAZINE 10 MG tablet Commonly known as:  APRESOLINE Take 1 tablet (10 mg total) by mouth every 8 (eight) hours.   hydrochlorothiazide 25 MG tablet Commonly known as:  HYDRODIURIL Take 25 mg by mouth daily.   polyethylene glycol powder powder Commonly known as:  GLYCOLAX/MIRALAX Take 17 g by mouth daily as needed (for constipation).   SYSTANE OP Place 1 drop into both eyes 2 (two) times daily.   traMADol 50 MG tablet Commonly known as:  ULTRAM Take 1  tablet (50 mg total) by mouth every 6 (six) hours as needed for moderate pain (mild pain, breakthrough).   TYLENOL 8 HOUR ARTHRITIS PAIN 650 MG CR tablet Generic drug:  acetaminophen Take 650 mg by mouth every 8 (eight) hours as needed for pain.   ZOFRAN PO Take 1 tablet by mouth daily as needed (nausea).       Contact information for follow-up providers    Excell Seltzer, MD Follow up on 06/13/2018.   Specialty:  General  Surgery Why:  4pm, arrive by 3:30pm for paperwork and check in.  If patient coming from skilled nursing facility, someone must stay with the patient during her entire visit or her appointment will be rescheduled. Contact information: [place] [address] [phone]        Lajean Manes, MD. Schedule an appointment as soon as possible for a visit in 1 week(s).   Specialty:  Internal Medicine Contact information: [address] [place] [address] [phone]            Contact information for after-discharge care    Destination    [facility] .   Service:  Skilled Nursing Contact information: [address] [place] [phone]                 Allergies  Allergen Reactions  . Felodipine Anaphylaxis and Other (See Comments)    Reaction: unknown possible nausea or rash  . Prednisone   . Shrimp [Shellfish Allergy] Nausea And Vomiting  . Statins Other (See Comments)    Muscle/leg pain.  Marland Kitchen Omeprazole Other (See Comments)    Reaction: unknown possible nausea or rash    Consultations:  Surgery.    Procedures/Studies: Dg Chest 2 View  Result Date: 05/18/2018 CLINICAL DATA:  Preoperative evaluation for small bowel obstruction, history hiatal hernia, hypertension, coronary artery disease EXAM: CHEST - 2 VIEW COMPARISON:  CT abdomen and pelvis 05/04/2018 FINDINGS: Nasogastric tube coiled in a large hiatal hernia before extending into abdomen. Normal heart size, mediastinal contours, and pulmonary vascularity. Atherosclerotic calcification aorta. Subsegmental atelectasis at LEFT base. Remaining lungs clear. No pleural effusion or pneumothorax. IMPRESSION: Large hiatal hernia. Subsegmental atelectasis LEFT base. Electronically Signed   By: Lavonia Dana M.D.   On: 05/18/2018 10:57   Dg Abd 1 View  Result Date: 05/19/2018 CLINICAL DATA:  Small-bowel obstruction.  Follow-up. EXAM:  ABDOMEN - 1 VIEW COMPARISON:  05/18/2018 FINDINGS: Nasogastric tube is no longer visible. Bowel gas pattern is similar, with a good bit of air/gas in the small and large bowel and fecal matter in the right colon. A prominent small bowel loop seen in the mid abdomen yesterday is probably slightly less prominent. Certainly there does not appear to be any worsening. IMPRESSION: Similar appearance to yesterday. Possible slight decreased dilatation of at least 1 small bowel loop. No sign of worsening. Electronically Signed   By: Nelson Chimes M.D.   On: 05/19/2018 11:40   Dg Abd 1 View  Result Date: 05/18/2018 CLINICAL DATA:  Abdominal pain, follow-up small-bowel obstruction EXAM: ABDOMEN - 1 VIEW COMPARISON:  05/18/2018 FINDINGS: Scattered gas and stool in colon. Few mildly prominent small bowel loops in mid abdomen. No bowel wall thickening. Surgical clips RIGHT upper quadrant from cholecystectomy and at the inferior pelvis bilaterally question herniorrhaphies. No urinary tract calcification. IMPRESSION: Persistent few mildly prominent small bowel loops in the mid abdomen. Electronically Signed   By: Lavonia Dana  M.D.   On: 05/18/2018 10:56   Dg Abdomen 1 View  Result Date: 05/18/2018 CLINICAL DATA:  84 y/o  F; nasogastric tube placement. EXAM: ABDOMEN - 1 VIEW COMPARISON:  05/18/2018 CT abdomen and pelvis. FINDINGS: Dilated loops of small bowel in the mid abdomen. Enteric tube is coiling over the lower chest within a large hiatal hernia. Right upper quadrant cholecystectomy clips. Moderate lumbar spine dextrocurvature. IMPRESSION: Enteric tube is coiled over the lower chest within a large hiatal hernia. Dilated loops of small bowel in the mid abdomen. Electronically Signed   By: Kristine Garbe M.D.   On: 05/18/2018 03:43   Ct Chest Wo Contrast  Result Date: 05/04/2018 CLINICAL DATA:  84 year old female with left lung base nodule on March CT Abdomen and Pelvis. EXAM: CT CHEST WITHOUT CONTRAST  TECHNIQUE: Multidetector CT imaging of the chest was performed following the standard protocol without IV contrast. COMPARISON:  CT Abdomen and Pelvis 10/18/2017. FINDINGS: Cardiovascular: Calcified coronary artery atherosclerosis and Calcified aortic atherosclerosis. Vascular patency is not evaluated in the absence of IV contrast. Stable cardiac size, within normal limits. No pericardial effusion. Mediastinum/Nodes: No mediastinal lymphadenopathy. Moderate size gastric hiatal hernia appears smaller than on 10/18/2017. Lungs/Pleura: Resolved small subpleural left lower lobe lung nodule since the prior CT Abdomen and Pelvis (series 8, image 127 today). Stable smaller nearby 2-3 millimeter nodule on series 8, image 120, and there is a nearby calcified granuloma on image 114. Tiny calcified granuloma in the superior segment of the left lower lobe. Mild left apical lung scarring.  Similar scarring in the lingula. The major airways are patent. Occasional mild scarring in the right lung. There is a tiny 1-2 millimeter subpleural nodule along the minor fissure on series 8, image 89. Upper Abdomen: Stable visible upper abdominal viscera as described on 10/18/2017. This includes the lobulated partially fat density into the anterior to the left hepatic lobe seen on series 2, image 42 today. Musculoskeletal: No acute osseous abnormality identified. Partially visible lumbar interbody implant. IMPRESSION: 1. The small left lung base nodule seen on 10/18/2017 has resolved and was inflammatory. Several small calcified granulomas are noted in the lungs along with occasional tiny noncalcified lung nodules which are most likely benign. If this patient is high risk then non-contrast Chest CT follow-up can be obtained 1 year from now. This recommendation follows the consensus statement: Guidelines for Management of Incidental Pulmonary Nodules Detected on CT Images: From the Fleischner Society 2017; Radiology 2017; 284:228-243. 2.  Aortic Atherosclerosis (ICD10-I70.0). Calcified coronary artery atherosclerosis. 3. Chronic hiatal hernia, mildly smaller than in March. Otherwise stable visible upper abdomen as described on 10/18/2017. Electronically Signed   By: Genevie Ann M.D.   On: 05/04/2018 15:30   Ct Renal Stone Study  Result Date: 05/18/2018 CLINICAL DATA:  Abdominal pain and vomiting. EXAM: CT ABDOMEN AND PELVIS WITHOUT CONTRAST TECHNIQUE: Multidetector CT imaging of the abdomen and pelvis was performed following the standard protocol without IV contrast. COMPARISON:  Most recent comparison CT 10/18/2017. CT 05/02/2016 also reviewed FINDINGS: Lower chest: No pleural fluid or consolidation. Moderate-sized hiatal hernia, similar to prior exam. There are mitral annulus calcifications. Hepatobiliary: No focal hepatic lesion on noncontrast exam. Clips in the gallbladder fossa postcholecystectomy. No biliary dilatation. Pancreas: No ductal dilatation or inflammation. No CT findings of pancreatitis. Spleen: Prior splenectomy. Small soft tissue deposit in the left upper quadrant consistent with splenosis. Additional soft tissue deposits adjacent to the hiatal hernia also likely splenosis. Adrenals/Urinary Tract: No adrenal nodule. No hydronephrosis  or perinephric edema. Bilateral cortical low-density lesions in both kidneys, similar to prior exam and likely cysts. No urolithiasis. Urinary bladder is physiologically distended without wall thickening. Stomach/Bowel: Lack of enteric contrast limits bowel evaluation. Moderate hiatal hernia, fluid within the intrathoracic portion of the stomach. Complex upper ventral abdominal wall hernia containing multiple loops of small bowel. Small bowel loops within the hernia and proximally are dilated and fluid-filled with mesenteric stranding and small amount of free fluid. Transition point suspected in the hernia with exiting small bowel decompressed, for example image 37 series 2 and image 72 series 6. More  distal small bowel loops are decompressed. Moderate colonic stool burden. Questionable additional hernia of the right lateral lower abdominal wall involving the cecum, image 58 series 2, with bowel extending superficial to the peritoneal reflection, but no inflammatory changes or obstruction at this site. This is proximal to the prior right inguinal hernia repair. Sigmoid colonic tortuosity. Colonic diverticulosis of the distal colon without diverticulitis. Vascular/Lymphatic: Aorto bi-iliac atherosclerosis. Limited assessment for adenopathy given lack contrast. Reproductive: Status post hysterectomy. No adnexal masses. Other: Ventral abdominal wall hernia containing dilated inflamed small bowel with stranding of the herniated fat and small amount of free fluid. Mesenteric edema involving upper abdominal small bowel. No free air or perforation. Lobular soft tissue density in the midline anterior upper abdomen anterior to the left lobe of the liver measuring 1.7 x 4.3 x 4.6 cm. Prior right and left inguinal hernia repair. Musculoskeletal: Postsurgical and degenerative change in the spine. There are no acute or suspicious osseous abnormalities. IMPRESSION: 1. Findings consistent with small bowel obstruction with transition point in complex upper ventral abdominal wall hernia. 2. Suspect additional abdominal wall hernia containing cecum in the right lower lateral anterior abdominal wall, proximal to site of prior inguinal hernia repair. No obstruction or inflammatory changes related to this hernia. 3. Lobular soft tissue density in the central upper abdomen anterior to the left lobe of the liver. Only mild increase in size since 2017. Differential considerations include splenosis given prior splenectomy, lymph nodes or peritoneal carcinomatosis. Benign etiology is favored given lack of significant progression over the course of 2 years. 4. Chronic and incidental findings include moderate to large hiatal hernia, colonic  diverticulosis, and Aortic Atherosclerosis (ICD10-I70.0). Electronically Signed   By: Keith Rake M.D.   On: 05/18/2018 02:27      Subjective: abd pain is better, no nausea or vomiting. Had one BM yesterday.   Discharge Exam: Vitals:   05/25/18 0332 05/25/18 0549  BP: (!) 158/69 (!) 160/65  Pulse:  71  Resp:  15  Temp:  98.3 F (36.8 C)  SpO2:  96%   Vitals:   05/24/18 1706 05/24/18 2353 05/25/18 0332 05/25/18 0549  BP: (!) 157/65 (!) 177/67 (!) 158/69 (!) 160/65  Pulse: 66 (!) 56  71  Resp:    15  Temp:  97.8 F (36.6 C)  98.3 F (36.8 C)  TempSrc:  Oral  Oral  SpO2:  98%  96%  Weight:      Height:        General: Pt is alert, awake, not in acute distress Cardiovascular: RRR, S1/S2 +, no rubs, no gallops Respiratory: CTA bilaterally, no wheezing, no rhonchi Abdominal: Soft, NT, ND, bowel sounds + Extremities: no edema, no cyanosis    The results of significant diagnostics from this hospitalization (including imaging, microbiology, ancillary and laboratory) are listed below for reference.     Microbiology: Recent Results (from the past  240 hour(s))  MRSA PCR Screening     Status: None   Collection Time: 05/18/18 11:32 PM  Result Value Ref Range Status   MRSA by PCR NEGATIVE NEGATIVE Final    Comment:        The GeneXpert MRSA Assay (FDA approved for NASAL specimens only), is one component of a comprehensive MRSA colonization surveillance program. It is not intended to diagnose MRSA infection nor to guide or monitor treatment for MRSA infections. Performed at [facility], [place] [address], [place], [place] [address]      Labs: BNP (last 3 results) No results for input(s): BNP in the last 8760 hours. Basic Metabolic Panel: Recent Labs  Lab 05/19/18 0503 05/20/18 0430  NA 140  139 141  K 3.2*  3.2* 4.1  CL 104  104 108  CO2 27  26 25   GLUCOSE 111*  111* 147*  BUN 20  19 21   CREATININE 0.87  0.83 0.92  CALCIUM 8.9   8.9 9.0  MG 2.2 1.9  PHOS 2.4* 3.0   Liver Function Tests: Recent Labs  Lab 05/19/18 0503 05/20/18 0430  ALBUMIN 2.9* 2.8*   No results for input(s): LIPASE, AMYLASE in the last 168 hours. No results for input(s): AMMONIA in the last 168 hours. CBC: Recent Labs  Lab 05/19/18 0503 05/20/18 0430  WBC 8.1 12.2*  NEUTROABS 5.7 10.3*  HGB 14.2 14.4  HCT 43.3 43.6  MCV 91.2 92.2  PLT 215 205   Cardiac Enzymes: Recent Labs  Lab 05/21/18 0432  TROPONINI <0.03   BNP: Invalid input(s): POCBNP CBG: No results for input(s): GLUCAP in the last 168 hours. D-Dimer No results for input(s): DDIMER in the last 72 hours. Hgb A1c No results for input(s): HGBA1C in the last 72 hours. Lipid Profile No results for input(s): CHOL, HDL, LDLCALC, TRIG, CHOLHDL, LDLDIRECT in the last 72 hours. Thyroid function studies No results for input(s): TSH, T4TOTAL, T3FREE, THYROIDAB in the last 72 hours.  Invalid input(s): FREET3 Anemia work up No results for input(s): VITAMINB12, FOLATE, FERRITIN, TIBC, IRON, RETICCTPCT in the last 72 hours. Urinalysis    Component Value Date/Time   COLORURINE YELLOW 05/18/2018 1005   APPEARANCEUR CLEAR 05/18/2018 1005   LABSPEC 1.015 05/18/2018 1005   PHURINE 7.0 05/18/2018 1005   GLUCOSEU NEGATIVE 05/18/2018 1005   HGBUR NEGATIVE 05/18/2018 1005   BILIRUBINUR NEGATIVE 05/18/2018 1005   KETONESUR 5 (A) 05/18/2018 1005   PROTEINUR NEGATIVE 05/18/2018 1005   UROBILINOGEN 0.2 08/02/2013 1342   NITRITE NEGATIVE 05/18/2018 1005   LEUKOCYTESUR TRACE (A) 05/18/2018 1005   Sepsis Labs Invalid input(s): PROCALCITONIN,  WBC,  LACTICIDVEN Microbiology Recent Results (from the past 240 hour(s))  MRSA PCR Screening     Status: None   Collection Time: 05/18/18 11:32 PM  Result Value Ref Range Status   MRSA by PCR NEGATIVE NEGATIVE Final    Comment:        The GeneXpert MRSA Assay (FDA approved for NASAL specimens only), is one component of  a comprehensive MRSA colonization surveillance program. It is not intended to diagnose MRSA infection nor to guide or monitor treatment for MRSA infections. Performed at [facility], [place] [address], [place], [place] [address]      Time coordinating discharge: 31  minutes  SIGNED:   Hosie Poisson, MD  [facility] 05/25/2018, 9:53 AM Pager   If 7PM-7AM, please contact night-coverage [email or web address] Password [email or web address]

## 2018-05-25 NOTE — Progress Notes (Signed)
Physical Therapy Treatment Patient Details Name: [name] MRN: [id number] DOB: 06/24/1929 Today's Date: 05/25/2018    History of Present Illness Patient is an 84 y/o admitted with Incarcerated incisional hernia with small bowel obstruction,  s/p Retrorectus repair ventral incisional hernia with mesh, Dr. Excell Seltzer 10-4.  PMH positive for arthritis and HTN and prior abdominal surgery, knee surgery, back and shoulder surgery.    PT Comments    Pt ambulated hallway distance today, still limited somewhat by abdominal pain. PT focused session on tolerance for mobility and reinforcing bed mobility with log roll technique to protect, and decrease discomfort in, abdomen. Pt to d/c to SNF today.    Follow Up Recommendations  SNF     Equipment Recommendations  None recommended by PT    Recommendations for Other Services       Precautions / Restrictions Precautions Precautions: Fall Required Braces or Orthoses: Other Brace/Splint Other Brace/Splint: abdominal binder Restrictions Weight Bearing Restrictions: No    Mobility  Bed Mobility Overal bed mobility: Needs Assistance Bed Mobility: Rolling;Sidelying to Sit Rolling: Min guard Sidelying to sit: Min assist       General bed mobility comments: Min guard for rolling bilaterally, verbal cuing for log roll technique reinforcement. Min assist for sidelying to sit for trunk elevation. Pt with steadying upon sitting.   Transfers Overall transfer level: Needs assistance Equipment used: Rolling walker (2 wheeled) Transfers: Sit to/from Stand Sit to Stand: Min assist;From elevated surface         General transfer comment: Min assist for trunk elevation, increased time and effort to complete.   Ambulation/Gait Ambulation/Gait assistance: Min guard Gait Distance (Feet): 90 Feet(40 ft, then 50 ft) Assistive device: Rolling walker (2 wheeled) Gait Pattern/deviations: Step-through pattern;Trunk flexed;Decreased stride  length Gait velocity: decr    General Gait Details: Trunk flexion due to abdominal soreness. Min guard for safety. After 40 ft ambulation, pt stating she was urinating without brief or catheter on. NT brought pt's recliner and pt was taken back to room, utilized [facility]. Pt then wanted to ambulate a second time. Pt ambulated 50 ft with RW after toileting.    Stairs             Wheelchair Mobility    Modified Rankin (Stroke Patients Only)       Balance Overall balance assessment: Needs assistance Sitting-balance support: Bilateral upper extremity supported Sitting balance-Leahy Scale: Fair     Standing balance support: Bilateral upper extremity supported Standing balance-Leahy Scale: Poor Standing balance comment: UE support needed for balance                            Cognition Arousal/Alertness: Awake/alert Behavior During Therapy: WFL for tasks assessed/performed Overall Cognitive Status: Within Functional Limits for tasks assessed                                        Exercises      General Comments        Pertinent Vitals/Pain Pain Assessment: Faces Faces Pain Scale: Hurts little more Pain Location: abdomen with mobility Pain Descriptors / Indicators: Sore;Tightness Pain Intervention(s): Limited activity within patient's tolerance;Repositioned;Monitored during session    Home Living                      Prior Function  PT Goals (current goals can now be found in the care plan section) Acute Rehab PT Goals Patient Stated Goal: to return to independent after rehab stay PT Goal Formulation: With patient Time For Goal Achievement: 06/05/18 Potential to Achieve Goals: Good Progress towards PT goals: Progressing toward goals    Frequency    Min 3X/week      PT Plan Current plan remains appropriate    Co-evaluation              AM-PAC PT "6 Clicks" Daily Activity  Outcome Measure   Difficulty turning over in bed (including adjusting bedclothes, sheets and blankets)?: Unable Difficulty moving from lying on back to sitting on the side of the bed? : Unable Difficulty sitting down on and standing up from a chair with arms (e.g., wheelchair, bedside commode, etc,.)?: Unable Help needed moving to and from a bed to chair (including a wheelchair)?: A Little Help needed walking in hospital room?: A Little Help needed climbing 3-5 steps with a railing? : A Little 6 Click Score: 12    End of Session Equipment Utilized During Treatment: Gait belt Activity Tolerance: Patient tolerated treatment well;Patient limited by pain Patient left: in chair;with chair alarm set;with call bell/phone within reach;with nursing/sitter in room Nurse Communication: Mobility status PT Visit Diagnosis: Other abnormalities of gait and mobility (R26.89);Difficulty in walking, not elsewhere classified (R26.2)     Time: 6967-8938 PT Time Calculation (min) (ACUTE ONLY): 27 min  Charges:  $Gait Training: 8-22 mins $Therapeutic Activity: 8-22 mins                     Julien Girt, PT Acute Rehabilitation Services Pager [phone]  Office [place] 05/25/2018, 5:12 PM

## 2018-05-25 NOTE — Discharge Instructions (Signed)
DC MIDLINE ABDOMINAL STAPLES ON 05-29-18 AT [facility], [place] [phone]  OPEN ABDOMINAL SURGERY: POST OP INSTRUCTIONS  Always review your discharge instruction sheet given to you by the facility where your surgery was performed.  IF YOU HAVE DISABILITY OR FAMILY LEAVE FORMS, YOU MUST BRING THEM TO THE OFFICE FOR PROCESSING.  PLEASE DO NOT GIVE THEM TO YOUR DOCTOR.  1. A prescription for pain medication may be given to you upon discharge.  Take your pain medication as prescribed, if needed.  If narcotic pain medicine is not needed, then you may take acetaminophen (Tylenol) or ibuprofen (Advil) as needed. 2. Take your usually prescribed medications unless otherwise directed. 3. If you need a refill on your pain medication, please contact your pharmacy. They will contact our office to request authorization.  Prescriptions will not be filled after 5pm or on week-ends. 4. You should follow a light diet the first few days after arrival home, such as soup and crackers, pudding, etc.unless your doctor has advised otherwise. A high-fiber, low fat diet can be resumed as tolerated.   Be sure to include lots of fluids daily. Most patients will experience some swelling and bruising on the chest and neck area.  Ice packs will help.  Swelling and bruising can take several days to resolve 5. Most patients will experience some swelling and bruising in the area of the incision. Ice pack will help. Swelling and bruising can take several days to resolve..  6. It is common to experience some constipation if taking pain medication after surgery.  Increasing fluid intake and taking a stool softener will usually help or prevent this problem from occurring.  A mild laxative (Milk of Magnesia or Miralax) should be taken according to package directions if there are no bowel movements after 48 hours. 7.  You may have steri-strips (small skin tapes) in place directly over the incision.  These  strips should be left on the skin for 7-10 days.  If your surgeon used skin glue on the incision, you may shower in 24 hours.  The glue will flake off over the next 2-3 weeks.  Any sutures or staples will be removed at the office during your follow-up visit. You may find that a light gauze bandage over your incision may keep your staples from being rubbed or pulled. You may shower and replace the bandage daily. 8. ACTIVITIES:  You may resume regular (light) daily activities beginning the next day--such as daily self-care, walking, climbing stairs--gradually increasing activities as tolerated.  You may have sexual intercourse when it is comfortable.  Refrain from any heavy lifting or straining until approved by your doctor. a. You may drive when you no longer are taking prescription pain medication, you can comfortably wear a seatbelt, and you can safely maneuver your car and apply brakes b. Return to Work: ___________________________________ 57. You should see your doctor in the office for a follow-up appointment approximately two weeks after your surgery.  Make sure that you call for this appointment within a day or two after you arrive home to insure a convenient appointment time. OTHER INSTRUCTIONS:  _____________________________________________________________ _____________________________________________________________  WHEN TO CALL YOUR DOCTOR: 1. Fever over 101.0 2. Inability to urinate 3. Nausea and/or vomiting 4. Extreme swelling or bruising 5. Continued bleeding from incision. 6. Increased pain, redness, or drainage from the incision. 7. Difficulty swallowing or breathing 8. Muscle cramping or spasms. 9. Numbness or tingling in hands or feet or  around lips.  The clinic staff is available to answer your questions during regular business hours.  Please dont hesitate to call and ask to speak to one of the nurses if you have concerns.  For further questions, please visit  [email or web address]

## 2018-05-25 NOTE — Progress Notes (Signed)
Patient ID: [name], female   DOB: 1928-10-08, 84 y.o.   MRN: [id number]    6 Days Post-Op  Subjective: Patient feels well today.  Had a BM yesterday.  Tolerating a soft diet.  Minimal pain.  Sitting in her chair.  Objective: Vital signs in last 24 hours: Temp:  [97.8 F (36.6 C)-98.3 F (36.8 C)] 98.3 F (36.8 C) (10/10 0549) Pulse Rate:  [56-72] 71 (10/10 0549) Resp:  [15] 15 (10/10 0549) BP: (157-183)/(65-77) 160/65 (10/10 0549) SpO2:  [96 %-98 %] 96 % (10/10 0549) Last BM Date: 05/18/18  Intake/Output from previous day: 10/09 0701 - 10/10 0700 In: 4098 [P.O.:500; I.V.:1039] Out: 1191 [Urine:1100; Drains:12] Intake/Output this shift: No intake/output data recorded.  PE: Abd: soft, minimally tender, incision c/d/i. (will DC dressings today), JP drains intact with minimal output as well (DC both drains today per RN)  Lab Results:  No results for input(s): WBC, HGB, HCT, PLT in the last 72 hours. BMET No results for input(s): NA, K, CL, CO2, GLUCOSE, BUN, CREATININE, CALCIUM in the last 72 hours. PT/INR No results for input(s): LABPROT, INR in the last 72 hours. CMP     Component Value Date/Time   NA 141 05/20/2018 0430   NA 139 09/07/2016 0958   K 4.1 05/20/2018 0430   K 3.9 09/07/2016 0958   CL 108 05/20/2018 0430   CO2 25 05/20/2018 0430   CO2 24 09/07/2016 0958   GLUCOSE 147 (H) 05/20/2018 0430   GLUCOSE 98 09/07/2016 0958   BUN 21 05/20/2018 0430   BUN 27.2 (H) 09/07/2016 0958   CREATININE 0.92 05/20/2018 0430   CREATININE 1.0 09/07/2016 0958   CALCIUM 9.0 05/20/2018 0430   CALCIUM 10.4 09/07/2016 0958   PROT 6.6 05/18/2018 0632   PROT 6.7 09/07/2016 0958   ALBUMIN 2.8 (L) 05/20/2018 0430   ALBUMIN 3.7 09/07/2016 0958   AST 26 05/18/2018 0632   AST 25 09/07/2016 0958   ALT 20 05/18/2018 0632   ALT 15 09/07/2016 0958   ALKPHOS 69 05/18/2018 0632   ALKPHOS 82 09/07/2016 0958   BILITOT 0.6 05/18/2018 0632   BILITOT 0.35 09/07/2016 0958   GFRNONAA 54 (L) 05/20/2018 0430   GFRAA >60 05/20/2018 0430   Lipase     Component Value Date/Time   LIPASE 104 (H) 05/18/2018 0002       Studies/Results: No results found.  Anti-infectives: Anti-infectives (From admission, onward)   Start     Dose/Rate Route Frequency Ordered Stop   05/19/18 1030  cefoTEtan (CEFOTAN) 2 g in sodium chloride 0.9 % 100 mL IVPB     2 g 200 mL/hr over 30 Minutes Intravenous On call to O.R. 05/18/18 1409 05/19/18 1307       Assessment/Plan History of coronary artery disease Hypertension GERD Hyperlipidemia Hypomagnesemia   Incarcerated incisional hernia with small bowel obstruction, POD6, s/pRetrorectus repair ventral incisional hernia with mesh, Dr. Excell Seltzer 10-4 -doing well surgically -DC JP drains -will have White stone DC staples around POD 10.  Directions are in her discharge instruction section. -patient is surgically stable for DC home to SNF when medically stable  FEN: IV fluids/soft diet/Ensure ID: None DVT: SCDs/Lovenox Follow-up:Dr. Hoxworth, already made.  Will have facility remove her staples on POD 10, Monday 05-29-18   LOS: 7 days    [name] , [facility] 05/25/2018, 9:06 AM Pager: [phone]

## 2018-05-25 NOTE — Care Management Important Message (Signed)
Important Message  Patient Details  Name: [name] MRN: [id number] Date of Birth: [birth year]   Medicare Important Message Given:  Yes    Kerin Salen 05/25/2018, 11:24 AMImportant Message  Patient Details  Name: [name] MRN: [id number] Date of Birth: 1928-09-10   Medicare Important Message Given:  Yes    Kerin Salen 05/25/2018, 11:24 AM

## 2018-05-25 NOTE — Progress Notes (Signed)
(  2) JP drains removed per PA order. Honeycomb dressing removed as well per order.  Pt tolerated well.  Sites covered with gauze and paper tape. RN will monitor.

## 2018-05-25 NOTE — Evaluation (Signed)
Occupational Therapy Evaluation Patient Details Name: [name] MRN: [id number] DOB: 21-Dec-1928 Today's Date: 05/25/2018    History of Present Illness Patient is an 84 y/o admitted with Incarcerated incisional hernia with small bowel obstruction,  s/p Retrorectus repair ventral incisional hernia with mesh, Dr. Excell Seltzer 10-4.  PMH positive for arthritis and HTN and prior abdominal surgery, knee surgery, back and shoulder surgery.   Clinical Impression   Pt was admitted for the above sx.  She lives at [place] at baseline and completes her own ADLs.  She needs extensive assistance with adls at this time and will benefit from continued OT in acute setting as well as snf. Will follow with min A level goals    Follow Up Recommendations  SNF    Equipment Recommendations  3 in 1 bedside commode    Recommendations for Other Services       Precautions / Restrictions Precautions Precautions: Fall Other Brace/Splint: abdominal binder Restrictions Weight Bearing Restrictions: No      Mobility Bed Mobility               General bed mobility comments: oob  Transfers   Equipment used: Rolling walker (2 wheeled)   Sit to Stand: Min guard         General transfer comment: for safety    Balance     Sitting balance-Leahy Scale: Fair       Standing balance-Leahy Scale: Poor                             ADL either performed or assessed with clinical judgement   ADL Overall ADL's : Needs assistance/impaired Eating/Feeding: Independent   Grooming: Set up   Upper Body Bathing: Set up   Lower Body Bathing: Maximal assistance   Upper Body Dressing : Minimal assistance   Lower Body Dressing: Total assistance;Sit to/from stand   Toilet Transfer: Min guard;Stand-pivot;BSC;RW   Toileting- Clothing Manipulation and Hygiene: Maximal assistance;Sit to/from stand         General ADL Comments: Performed ADL from recliner.  Used commode.  Pt  does not feel secure releasing a hand in standing for ADLs.  Educated on concept of AE but did not use today     Museum/gallery curator      Pertinent Vitals/Pain Pain Assessment: 0-10 Pain Score: 7  Pain Location: abdomen with mobility Pain Descriptors / Indicators: Operative site guarding;Grimacing Pain Intervention(s): Limited activity within patient's tolerance;Monitored during session;Premedicated before session;Repositioned     Hand Dominance     Extremity/Trunk Assessment Upper Extremity Assessment Upper Extremity Assessment: Generalized weakness           Communication Communication Communication: No difficulties   Cognition Arousal/Alertness: Awake/alert Behavior During Therapy: WFL for tasks assessed/performed Overall Cognitive Status: Within Functional Limits for tasks assessed                                     General Comments       Exercises     Shoulder Instructions      Home Living Family/patient expects to be discharged to:: Unsure                                 Additional  Comments: from [place] at [facility]. Will likely needs STSNF      Prior Functioning/Environment Level of Independence: Independent with assistive device(s)                 OT Problem List:        OT Treatment/Interventions: Self-care/ADL training;DME and/or AE instruction;Patient/family education;Balance training;Therapeutic activities    OT Goals(Current goals can be found in the care plan section) Acute Rehab OT Goals Patient Stated Goal: rehab then return to independent living OT Goal Formulation: With patient Time For Goal Achievement: 06/08/18 Potential to Achieve Goals: Good ADL Goals Pt Will Perform Grooming: with min guard assist;standing Pt Will Perform Lower Body Bathing: with min guard assist;sit to/from stand;with adaptive equipment Pt Will Perform Lower Body Dressing: with min guard assist;with adaptive  equipment;sit to/from stand Pt Will Transfer to Toilet: with min guard assist;ambulating;bedside commode Pt Will Perform Toileting - Clothing Manipulation and hygiene: with min assist;sit to/from stand Additional ADL Goal #1: pt will perform bed mobility at min guard level from flat bed in preparation for adls  OT Frequency: Min 2X/week   Barriers to D/C: Decreased caregiver support          Co-evaluation              AM-PAC PT "6 Clicks" Daily Activity     Outcome Measure Help from another person eating meals?: None Help from another person taking care of personal grooming?: A Little Help from another person toileting, which includes using toliet, bedpan, or urinal?: A Lot Help from another person bathing (including washing, rinsing, drying)?: A Lot Help from another person to put on and taking off regular upper body clothing?: A Little Help from another person to put on and taking off regular lower body clothing?: A Lot 6 Click Score: 16   End of Session    Activity Tolerance: Patient tolerated treatment well Patient left: in chair;with call bell/phone within reach  OT Visit Diagnosis: Unsteadiness on feet (R26.81);Muscle weakness (generalized) (M62.81)                Time: 5374-8270 OT Time Calculation (min): 23 min Charges:  OT General Charges $OT Visit: 1 Visit OT Evaluation $OT Eval Low Complexity: [place], OTR/L Acute Rehabilitation Services [phone] [facility] pager [phone] office 05/25/2018  [place] 05/25/2018, 12:49 PM

## 2018-05-25 NOTE — Progress Notes (Signed)
Pt's bp 192/78, hydralazine PO 10mg  given.  IV had been removed pending discharge to SNF.  MD made aware.  50mg  PO hydralazine ordered once.  RN to recheck bp.  D/C to SNF on hold pending improvement in bp per MD.

## 2018-05-25 NOTE — Progress Notes (Signed)
Pt bp came down to 156/63. MD paged and made aware.  Ok to d/c to SNF.  [facility] transport called.    [facility]

## 2018-05-26 DIAGNOSIS — E876 Hypokalemia: Secondary | ICD-10-CM | POA: Diagnosis not present

## 2018-05-26 DIAGNOSIS — M6281 Muscle weakness (generalized): Secondary | ICD-10-CM | POA: Diagnosis not present

## 2018-05-26 DIAGNOSIS — M1990 Unspecified osteoarthritis, unspecified site: Secondary | ICD-10-CM | POA: Diagnosis not present

## 2018-05-26 DIAGNOSIS — F419 Anxiety disorder, unspecified: Secondary | ICD-10-CM | POA: Diagnosis not present

## 2018-05-26 DIAGNOSIS — E785 Hyperlipidemia, unspecified: Secondary | ICD-10-CM | POA: Diagnosis not present

## 2018-05-26 DIAGNOSIS — I2510 Atherosclerotic heart disease of native coronary artery without angina pectoris: Secondary | ICD-10-CM | POA: Diagnosis not present

## 2018-05-26 DIAGNOSIS — I10 Essential (primary) hypertension: Secondary | ICD-10-CM | POA: Diagnosis not present

## 2018-05-26 DIAGNOSIS — H2589 Other age-related cataract: Secondary | ICD-10-CM | POA: Diagnosis not present

## 2018-05-26 DIAGNOSIS — Z7409 Other reduced mobility: Secondary | ICD-10-CM | POA: Diagnosis not present

## 2018-05-26 DIAGNOSIS — Z48815 Encounter for surgical aftercare following surgery on the digestive system: Secondary | ICD-10-CM | POA: Diagnosis not present

## 2018-05-26 DIAGNOSIS — K5900 Constipation, unspecified: Secondary | ICD-10-CM | POA: Diagnosis not present

## 2018-05-29 DIAGNOSIS — E876 Hypokalemia: Secondary | ICD-10-CM | POA: Diagnosis not present

## 2018-05-29 DIAGNOSIS — M6281 Muscle weakness (generalized): Secondary | ICD-10-CM | POA: Diagnosis not present

## 2018-05-29 DIAGNOSIS — H2589 Other age-related cataract: Secondary | ICD-10-CM | POA: Diagnosis not present

## 2018-05-29 DIAGNOSIS — I10 Essential (primary) hypertension: Secondary | ICD-10-CM | POA: Diagnosis not present

## 2018-05-29 DIAGNOSIS — I2510 Atherosclerotic heart disease of native coronary artery without angina pectoris: Secondary | ICD-10-CM | POA: Diagnosis not present

## 2018-05-29 DIAGNOSIS — E785 Hyperlipidemia, unspecified: Secondary | ICD-10-CM | POA: Diagnosis not present

## 2018-05-29 DIAGNOSIS — M1990 Unspecified osteoarthritis, unspecified site: Secondary | ICD-10-CM | POA: Diagnosis not present

## 2018-05-29 DIAGNOSIS — K5900 Constipation, unspecified: Secondary | ICD-10-CM | POA: Diagnosis not present

## 2018-05-29 DIAGNOSIS — F419 Anxiety disorder, unspecified: Secondary | ICD-10-CM | POA: Diagnosis not present

## 2018-05-29 DIAGNOSIS — Z48815 Encounter for surgical aftercare following surgery on the digestive system: Secondary | ICD-10-CM | POA: Diagnosis not present

## 2018-06-01 DIAGNOSIS — I2510 Atherosclerotic heart disease of native coronary artery without angina pectoris: Secondary | ICD-10-CM | POA: Diagnosis not present

## 2018-06-01 DIAGNOSIS — K5900 Constipation, unspecified: Secondary | ICD-10-CM | POA: Diagnosis not present

## 2018-06-01 DIAGNOSIS — M1990 Unspecified osteoarthritis, unspecified site: Secondary | ICD-10-CM | POA: Diagnosis not present

## 2018-06-01 DIAGNOSIS — F419 Anxiety disorder, unspecified: Secondary | ICD-10-CM | POA: Diagnosis not present

## 2018-06-01 DIAGNOSIS — M6281 Muscle weakness (generalized): Secondary | ICD-10-CM | POA: Diagnosis not present

## 2018-06-01 DIAGNOSIS — I10 Essential (primary) hypertension: Secondary | ICD-10-CM | POA: Diagnosis not present

## 2018-06-01 DIAGNOSIS — Z7409 Other reduced mobility: Secondary | ICD-10-CM | POA: Diagnosis not present

## 2018-06-01 DIAGNOSIS — Z48815 Encounter for surgical aftercare following surgery on the digestive system: Secondary | ICD-10-CM | POA: Diagnosis not present

## 2018-06-02 DIAGNOSIS — K5900 Constipation, unspecified: Secondary | ICD-10-CM | POA: Diagnosis not present

## 2018-06-02 DIAGNOSIS — M1990 Unspecified osteoarthritis, unspecified site: Secondary | ICD-10-CM | POA: Diagnosis not present

## 2018-06-02 DIAGNOSIS — F419 Anxiety disorder, unspecified: Secondary | ICD-10-CM | POA: Diagnosis not present

## 2018-06-02 DIAGNOSIS — Z48815 Encounter for surgical aftercare following surgery on the digestive system: Secondary | ICD-10-CM | POA: Diagnosis not present

## 2018-06-02 DIAGNOSIS — I10 Essential (primary) hypertension: Secondary | ICD-10-CM | POA: Diagnosis not present

## 2018-06-02 DIAGNOSIS — M6281 Muscle weakness (generalized): Secondary | ICD-10-CM | POA: Diagnosis not present

## 2018-06-02 DIAGNOSIS — Z7409 Other reduced mobility: Secondary | ICD-10-CM | POA: Diagnosis not present

## 2018-06-02 DIAGNOSIS — I2510 Atherosclerotic heart disease of native coronary artery without angina pectoris: Secondary | ICD-10-CM | POA: Diagnosis not present

## 2018-06-07 DIAGNOSIS — F419 Anxiety disorder, unspecified: Secondary | ICD-10-CM | POA: Diagnosis not present

## 2018-06-07 DIAGNOSIS — I2510 Atherosclerotic heart disease of native coronary artery without angina pectoris: Secondary | ICD-10-CM | POA: Diagnosis not present

## 2018-06-07 DIAGNOSIS — K5900 Constipation, unspecified: Secondary | ICD-10-CM | POA: Diagnosis not present

## 2018-06-07 DIAGNOSIS — Z7409 Other reduced mobility: Secondary | ICD-10-CM | POA: Diagnosis not present

## 2018-06-07 DIAGNOSIS — I10 Essential (primary) hypertension: Secondary | ICD-10-CM | POA: Diagnosis not present

## 2018-06-07 DIAGNOSIS — M1990 Unspecified osteoarthritis, unspecified site: Secondary | ICD-10-CM | POA: Diagnosis not present

## 2018-06-07 DIAGNOSIS — M6281 Muscle weakness (generalized): Secondary | ICD-10-CM | POA: Diagnosis not present

## 2018-06-07 DIAGNOSIS — Z48815 Encounter for surgical aftercare following surgery on the digestive system: Secondary | ICD-10-CM | POA: Diagnosis not present

## 2018-06-11 DIAGNOSIS — I10 Essential (primary) hypertension: Secondary | ICD-10-CM | POA: Diagnosis not present

## 2018-06-11 DIAGNOSIS — I2510 Atherosclerotic heart disease of native coronary artery without angina pectoris: Secondary | ICD-10-CM | POA: Diagnosis not present

## 2018-06-11 DIAGNOSIS — M6281 Muscle weakness (generalized): Secondary | ICD-10-CM | POA: Diagnosis not present

## 2018-06-11 DIAGNOSIS — E569 Vitamin deficiency, unspecified: Secondary | ICD-10-CM | POA: Diagnosis not present

## 2018-06-11 DIAGNOSIS — H3530 Unspecified macular degeneration: Secondary | ICD-10-CM | POA: Diagnosis not present

## 2018-06-11 DIAGNOSIS — H04123 Dry eye syndrome of bilateral lacrimal glands: Secondary | ICD-10-CM | POA: Diagnosis not present

## 2018-06-11 DIAGNOSIS — K449 Diaphragmatic hernia without obstruction or gangrene: Secondary | ICD-10-CM | POA: Diagnosis not present

## 2018-06-11 DIAGNOSIS — Z7982 Long term (current) use of aspirin: Secondary | ICD-10-CM | POA: Diagnosis not present

## 2018-06-11 DIAGNOSIS — M1991 Primary osteoarthritis, unspecified site: Secondary | ICD-10-CM | POA: Diagnosis not present

## 2018-06-11 DIAGNOSIS — Z9181 History of falling: Secondary | ICD-10-CM | POA: Diagnosis not present

## 2018-06-11 DIAGNOSIS — Z48815 Encounter for surgical aftercare following surgery on the digestive system: Secondary | ICD-10-CM | POA: Diagnosis not present

## 2018-06-14 DIAGNOSIS — K449 Diaphragmatic hernia without obstruction or gangrene: Secondary | ICD-10-CM | POA: Diagnosis not present

## 2018-06-14 DIAGNOSIS — I10 Essential (primary) hypertension: Secondary | ICD-10-CM | POA: Diagnosis not present

## 2018-06-14 DIAGNOSIS — E569 Vitamin deficiency, unspecified: Secondary | ICD-10-CM | POA: Diagnosis not present

## 2018-06-14 DIAGNOSIS — Z48815 Encounter for surgical aftercare following surgery on the digestive system: Secondary | ICD-10-CM | POA: Diagnosis not present

## 2018-06-14 DIAGNOSIS — I2510 Atherosclerotic heart disease of native coronary artery without angina pectoris: Secondary | ICD-10-CM | POA: Diagnosis not present

## 2018-06-14 DIAGNOSIS — M1991 Primary osteoarthritis, unspecified site: Secondary | ICD-10-CM | POA: Diagnosis not present

## 2018-06-16 DIAGNOSIS — I10 Essential (primary) hypertension: Secondary | ICD-10-CM | POA: Diagnosis not present

## 2018-06-16 DIAGNOSIS — E569 Vitamin deficiency, unspecified: Secondary | ICD-10-CM | POA: Diagnosis not present

## 2018-06-16 DIAGNOSIS — M1991 Primary osteoarthritis, unspecified site: Secondary | ICD-10-CM | POA: Diagnosis not present

## 2018-06-16 DIAGNOSIS — K449 Diaphragmatic hernia without obstruction or gangrene: Secondary | ICD-10-CM | POA: Diagnosis not present

## 2018-06-16 DIAGNOSIS — I2510 Atherosclerotic heart disease of native coronary artery without angina pectoris: Secondary | ICD-10-CM | POA: Diagnosis not present

## 2018-06-16 DIAGNOSIS — Z48815 Encounter for surgical aftercare following surgery on the digestive system: Secondary | ICD-10-CM | POA: Diagnosis not present

## 2018-06-19 DIAGNOSIS — K449 Diaphragmatic hernia without obstruction or gangrene: Secondary | ICD-10-CM | POA: Diagnosis not present

## 2018-06-19 DIAGNOSIS — I2510 Atherosclerotic heart disease of native coronary artery without angina pectoris: Secondary | ICD-10-CM | POA: Diagnosis not present

## 2018-06-19 DIAGNOSIS — M1991 Primary osteoarthritis, unspecified site: Secondary | ICD-10-CM | POA: Diagnosis not present

## 2018-06-19 DIAGNOSIS — Z48815 Encounter for surgical aftercare following surgery on the digestive system: Secondary | ICD-10-CM | POA: Diagnosis not present

## 2018-06-19 DIAGNOSIS — I10 Essential (primary) hypertension: Secondary | ICD-10-CM | POA: Diagnosis not present

## 2018-06-19 DIAGNOSIS — E569 Vitamin deficiency, unspecified: Secondary | ICD-10-CM | POA: Diagnosis not present

## 2018-06-20 DIAGNOSIS — I2510 Atherosclerotic heart disease of native coronary artery without angina pectoris: Secondary | ICD-10-CM | POA: Diagnosis not present

## 2018-06-20 DIAGNOSIS — Z48815 Encounter for surgical aftercare following surgery on the digestive system: Secondary | ICD-10-CM | POA: Diagnosis not present

## 2018-06-20 DIAGNOSIS — I10 Essential (primary) hypertension: Secondary | ICD-10-CM | POA: Diagnosis not present

## 2018-06-20 DIAGNOSIS — E569 Vitamin deficiency, unspecified: Secondary | ICD-10-CM | POA: Diagnosis not present

## 2018-06-20 DIAGNOSIS — M1991 Primary osteoarthritis, unspecified site: Secondary | ICD-10-CM | POA: Diagnosis not present

## 2018-06-20 DIAGNOSIS — K449 Diaphragmatic hernia without obstruction or gangrene: Secondary | ICD-10-CM | POA: Diagnosis not present

## 2018-06-21 DIAGNOSIS — Z48815 Encounter for surgical aftercare following surgery on the digestive system: Secondary | ICD-10-CM | POA: Diagnosis not present

## 2018-06-21 DIAGNOSIS — E569 Vitamin deficiency, unspecified: Secondary | ICD-10-CM | POA: Diagnosis not present

## 2018-06-21 DIAGNOSIS — K449 Diaphragmatic hernia without obstruction or gangrene: Secondary | ICD-10-CM | POA: Diagnosis not present

## 2018-06-21 DIAGNOSIS — I2510 Atherosclerotic heart disease of native coronary artery without angina pectoris: Secondary | ICD-10-CM | POA: Diagnosis not present

## 2018-06-21 DIAGNOSIS — M1991 Primary osteoarthritis, unspecified site: Secondary | ICD-10-CM | POA: Diagnosis not present

## 2018-06-21 DIAGNOSIS — I10 Essential (primary) hypertension: Secondary | ICD-10-CM | POA: Diagnosis not present

## 2018-06-22 DIAGNOSIS — I2510 Atherosclerotic heart disease of native coronary artery without angina pectoris: Secondary | ICD-10-CM | POA: Diagnosis not present

## 2018-06-22 DIAGNOSIS — M1991 Primary osteoarthritis, unspecified site: Secondary | ICD-10-CM | POA: Diagnosis not present

## 2018-06-22 DIAGNOSIS — K449 Diaphragmatic hernia without obstruction or gangrene: Secondary | ICD-10-CM | POA: Diagnosis not present

## 2018-06-22 DIAGNOSIS — E569 Vitamin deficiency, unspecified: Secondary | ICD-10-CM | POA: Diagnosis not present

## 2018-06-22 DIAGNOSIS — Z48815 Encounter for surgical aftercare following surgery on the digestive system: Secondary | ICD-10-CM | POA: Diagnosis not present

## 2018-06-22 DIAGNOSIS — I10 Essential (primary) hypertension: Secondary | ICD-10-CM | POA: Diagnosis not present

## 2018-06-28 DIAGNOSIS — E569 Vitamin deficiency, unspecified: Secondary | ICD-10-CM | POA: Diagnosis not present

## 2018-06-28 DIAGNOSIS — Z48815 Encounter for surgical aftercare following surgery on the digestive system: Secondary | ICD-10-CM | POA: Diagnosis not present

## 2018-06-28 DIAGNOSIS — I2510 Atherosclerotic heart disease of native coronary artery without angina pectoris: Secondary | ICD-10-CM | POA: Diagnosis not present

## 2018-06-28 DIAGNOSIS — I10 Essential (primary) hypertension: Secondary | ICD-10-CM | POA: Diagnosis not present

## 2018-06-28 DIAGNOSIS — K449 Diaphragmatic hernia without obstruction or gangrene: Secondary | ICD-10-CM | POA: Diagnosis not present

## 2018-06-28 DIAGNOSIS — M1991 Primary osteoarthritis, unspecified site: Secondary | ICD-10-CM | POA: Diagnosis not present

## 2018-06-29 DIAGNOSIS — K449 Diaphragmatic hernia without obstruction or gangrene: Secondary | ICD-10-CM | POA: Diagnosis not present

## 2018-06-29 DIAGNOSIS — E569 Vitamin deficiency, unspecified: Secondary | ICD-10-CM | POA: Diagnosis not present

## 2018-06-29 DIAGNOSIS — I2510 Atherosclerotic heart disease of native coronary artery without angina pectoris: Secondary | ICD-10-CM | POA: Diagnosis not present

## 2018-06-29 DIAGNOSIS — Z48815 Encounter for surgical aftercare following surgery on the digestive system: Secondary | ICD-10-CM | POA: Diagnosis not present

## 2018-06-29 DIAGNOSIS — M1991 Primary osteoarthritis, unspecified site: Secondary | ICD-10-CM | POA: Diagnosis not present

## 2018-06-29 DIAGNOSIS — I10 Essential (primary) hypertension: Secondary | ICD-10-CM | POA: Diagnosis not present

## 2018-06-30 DIAGNOSIS — M1712 Unilateral primary osteoarthritis, left knee: Secondary | ICD-10-CM | POA: Diagnosis not present

## 2018-06-30 DIAGNOSIS — E569 Vitamin deficiency, unspecified: Secondary | ICD-10-CM | POA: Diagnosis not present

## 2018-06-30 DIAGNOSIS — M1991 Primary osteoarthritis, unspecified site: Secondary | ICD-10-CM | POA: Diagnosis not present

## 2018-06-30 DIAGNOSIS — K449 Diaphragmatic hernia without obstruction or gangrene: Secondary | ICD-10-CM | POA: Diagnosis not present

## 2018-06-30 DIAGNOSIS — I2510 Atherosclerotic heart disease of native coronary artery without angina pectoris: Secondary | ICD-10-CM | POA: Diagnosis not present

## 2018-06-30 DIAGNOSIS — Z48815 Encounter for surgical aftercare following surgery on the digestive system: Secondary | ICD-10-CM | POA: Diagnosis not present

## 2018-06-30 DIAGNOSIS — M1711 Unilateral primary osteoarthritis, right knee: Secondary | ICD-10-CM | POA: Diagnosis not present

## 2018-06-30 DIAGNOSIS — I10 Essential (primary) hypertension: Secondary | ICD-10-CM | POA: Diagnosis not present

## 2018-07-03 DIAGNOSIS — I2510 Atherosclerotic heart disease of native coronary artery without angina pectoris: Secondary | ICD-10-CM | POA: Diagnosis not present

## 2018-07-03 DIAGNOSIS — Z48815 Encounter for surgical aftercare following surgery on the digestive system: Secondary | ICD-10-CM | POA: Diagnosis not present

## 2018-07-03 DIAGNOSIS — K449 Diaphragmatic hernia without obstruction or gangrene: Secondary | ICD-10-CM | POA: Diagnosis not present

## 2018-07-03 DIAGNOSIS — E569 Vitamin deficiency, unspecified: Secondary | ICD-10-CM | POA: Diagnosis not present

## 2018-07-03 DIAGNOSIS — M1991 Primary osteoarthritis, unspecified site: Secondary | ICD-10-CM | POA: Diagnosis not present

## 2018-07-03 DIAGNOSIS — I10 Essential (primary) hypertension: Secondary | ICD-10-CM | POA: Diagnosis not present

## 2018-07-04 DIAGNOSIS — I10 Essential (primary) hypertension: Secondary | ICD-10-CM | POA: Diagnosis not present

## 2018-07-04 DIAGNOSIS — K449 Diaphragmatic hernia without obstruction or gangrene: Secondary | ICD-10-CM | POA: Diagnosis not present

## 2018-07-04 DIAGNOSIS — M1991 Primary osteoarthritis, unspecified site: Secondary | ICD-10-CM | POA: Diagnosis not present

## 2018-07-04 DIAGNOSIS — Z48815 Encounter for surgical aftercare following surgery on the digestive system: Secondary | ICD-10-CM | POA: Diagnosis not present

## 2018-07-04 DIAGNOSIS — I2510 Atherosclerotic heart disease of native coronary artery without angina pectoris: Secondary | ICD-10-CM | POA: Diagnosis not present

## 2018-07-04 DIAGNOSIS — E569 Vitamin deficiency, unspecified: Secondary | ICD-10-CM | POA: Diagnosis not present

## 2018-07-05 DIAGNOSIS — Z48815 Encounter for surgical aftercare following surgery on the digestive system: Secondary | ICD-10-CM | POA: Diagnosis not present

## 2018-07-05 DIAGNOSIS — I2510 Atherosclerotic heart disease of native coronary artery without angina pectoris: Secondary | ICD-10-CM | POA: Diagnosis not present

## 2018-07-05 DIAGNOSIS — I10 Essential (primary) hypertension: Secondary | ICD-10-CM | POA: Diagnosis not present

## 2018-07-05 DIAGNOSIS — E569 Vitamin deficiency, unspecified: Secondary | ICD-10-CM | POA: Diagnosis not present

## 2018-07-05 DIAGNOSIS — K449 Diaphragmatic hernia without obstruction or gangrene: Secondary | ICD-10-CM | POA: Diagnosis not present

## 2018-07-05 DIAGNOSIS — M1991 Primary osteoarthritis, unspecified site: Secondary | ICD-10-CM | POA: Diagnosis not present

## 2018-07-06 DIAGNOSIS — I10 Essential (primary) hypertension: Secondary | ICD-10-CM | POA: Diagnosis not present

## 2018-07-06 DIAGNOSIS — Z48815 Encounter for surgical aftercare following surgery on the digestive system: Secondary | ICD-10-CM | POA: Diagnosis not present

## 2018-07-06 DIAGNOSIS — E569 Vitamin deficiency, unspecified: Secondary | ICD-10-CM | POA: Diagnosis not present

## 2018-07-06 DIAGNOSIS — M1991 Primary osteoarthritis, unspecified site: Secondary | ICD-10-CM | POA: Diagnosis not present

## 2018-07-06 DIAGNOSIS — K449 Diaphragmatic hernia without obstruction or gangrene: Secondary | ICD-10-CM | POA: Diagnosis not present

## 2018-07-06 DIAGNOSIS — I2510 Atherosclerotic heart disease of native coronary artery without angina pectoris: Secondary | ICD-10-CM | POA: Diagnosis not present

## 2018-07-07 DIAGNOSIS — M170 Bilateral primary osteoarthritis of knee: Secondary | ICD-10-CM | POA: Diagnosis not present

## 2018-07-10 DIAGNOSIS — I10 Essential (primary) hypertension: Secondary | ICD-10-CM | POA: Diagnosis not present

## 2018-07-10 DIAGNOSIS — I2510 Atherosclerotic heart disease of native coronary artery without angina pectoris: Secondary | ICD-10-CM | POA: Diagnosis not present

## 2018-07-10 DIAGNOSIS — Z48815 Encounter for surgical aftercare following surgery on the digestive system: Secondary | ICD-10-CM | POA: Diagnosis not present

## 2018-07-10 DIAGNOSIS — M1991 Primary osteoarthritis, unspecified site: Secondary | ICD-10-CM | POA: Diagnosis not present

## 2018-07-10 DIAGNOSIS — E569 Vitamin deficiency, unspecified: Secondary | ICD-10-CM | POA: Diagnosis not present

## 2018-07-10 DIAGNOSIS — K449 Diaphragmatic hernia without obstruction or gangrene: Secondary | ICD-10-CM | POA: Diagnosis not present

## 2018-07-12 DIAGNOSIS — E569 Vitamin deficiency, unspecified: Secondary | ICD-10-CM | POA: Diagnosis not present

## 2018-07-12 DIAGNOSIS — I2510 Atherosclerotic heart disease of native coronary artery without angina pectoris: Secondary | ICD-10-CM | POA: Diagnosis not present

## 2018-07-12 DIAGNOSIS — M1991 Primary osteoarthritis, unspecified site: Secondary | ICD-10-CM | POA: Diagnosis not present

## 2018-07-12 DIAGNOSIS — Z48815 Encounter for surgical aftercare following surgery on the digestive system: Secondary | ICD-10-CM | POA: Diagnosis not present

## 2018-07-12 DIAGNOSIS — I10 Essential (primary) hypertension: Secondary | ICD-10-CM | POA: Diagnosis not present

## 2018-07-12 DIAGNOSIS — K449 Diaphragmatic hernia without obstruction or gangrene: Secondary | ICD-10-CM | POA: Diagnosis not present

## 2018-07-17 DIAGNOSIS — K449 Diaphragmatic hernia without obstruction or gangrene: Secondary | ICD-10-CM | POA: Diagnosis not present

## 2018-07-17 DIAGNOSIS — E569 Vitamin deficiency, unspecified: Secondary | ICD-10-CM | POA: Diagnosis not present

## 2018-07-17 DIAGNOSIS — I2510 Atherosclerotic heart disease of native coronary artery without angina pectoris: Secondary | ICD-10-CM | POA: Diagnosis not present

## 2018-07-17 DIAGNOSIS — I10 Essential (primary) hypertension: Secondary | ICD-10-CM | POA: Diagnosis not present

## 2018-07-17 DIAGNOSIS — Z48815 Encounter for surgical aftercare following surgery on the digestive system: Secondary | ICD-10-CM | POA: Diagnosis not present

## 2018-07-17 DIAGNOSIS — M1991 Primary osteoarthritis, unspecified site: Secondary | ICD-10-CM | POA: Diagnosis not present

## 2018-07-20 DIAGNOSIS — M545 Low back pain: Secondary | ICD-10-CM | POA: Diagnosis not present

## 2018-07-20 DIAGNOSIS — R8290 Unspecified abnormal findings in urine: Secondary | ICD-10-CM | POA: Diagnosis not present

## 2018-07-24 DIAGNOSIS — I2510 Atherosclerotic heart disease of native coronary artery without angina pectoris: Secondary | ICD-10-CM | POA: Diagnosis not present

## 2018-07-24 DIAGNOSIS — E569 Vitamin deficiency, unspecified: Secondary | ICD-10-CM | POA: Diagnosis not present

## 2018-07-24 DIAGNOSIS — I10 Essential (primary) hypertension: Secondary | ICD-10-CM | POA: Diagnosis not present

## 2018-07-24 DIAGNOSIS — Z48815 Encounter for surgical aftercare following surgery on the digestive system: Secondary | ICD-10-CM | POA: Diagnosis not present

## 2018-07-24 DIAGNOSIS — M1991 Primary osteoarthritis, unspecified site: Secondary | ICD-10-CM | POA: Diagnosis not present

## 2018-07-24 DIAGNOSIS — K449 Diaphragmatic hernia without obstruction or gangrene: Secondary | ICD-10-CM | POA: Diagnosis not present

## 2018-07-26 DIAGNOSIS — I2510 Atherosclerotic heart disease of native coronary artery without angina pectoris: Secondary | ICD-10-CM | POA: Diagnosis not present

## 2018-07-26 DIAGNOSIS — K449 Diaphragmatic hernia without obstruction or gangrene: Secondary | ICD-10-CM | POA: Diagnosis not present

## 2018-07-26 DIAGNOSIS — E569 Vitamin deficiency, unspecified: Secondary | ICD-10-CM | POA: Diagnosis not present

## 2018-07-26 DIAGNOSIS — Z48815 Encounter for surgical aftercare following surgery on the digestive system: Secondary | ICD-10-CM | POA: Diagnosis not present

## 2018-07-26 DIAGNOSIS — I10 Essential (primary) hypertension: Secondary | ICD-10-CM | POA: Diagnosis not present

## 2018-07-26 DIAGNOSIS — M1991 Primary osteoarthritis, unspecified site: Secondary | ICD-10-CM | POA: Diagnosis not present

## 2018-08-03 DIAGNOSIS — E119 Type 2 diabetes mellitus without complications: Secondary | ICD-10-CM | POA: Diagnosis not present

## 2018-08-03 DIAGNOSIS — E7800 Pure hypercholesterolemia, unspecified: Secondary | ICD-10-CM | POA: Diagnosis not present

## 2018-08-03 DIAGNOSIS — G720 Drug-induced myopathy: Secondary | ICD-10-CM | POA: Diagnosis not present

## 2018-08-03 DIAGNOSIS — N183 Chronic kidney disease, stage 3 (moderate): Secondary | ICD-10-CM | POA: Diagnosis not present

## 2018-08-03 DIAGNOSIS — E1169 Type 2 diabetes mellitus with other specified complication: Secondary | ICD-10-CM | POA: Diagnosis not present

## 2018-08-03 DIAGNOSIS — M25562 Pain in left knee: Secondary | ICD-10-CM | POA: Diagnosis not present

## 2018-08-03 DIAGNOSIS — I129 Hypertensive chronic kidney disease with stage 1 through stage 4 chronic kidney disease, or unspecified chronic kidney disease: Secondary | ICD-10-CM | POA: Diagnosis not present

## 2018-08-03 DIAGNOSIS — K5901 Slow transit constipation: Secondary | ICD-10-CM | POA: Diagnosis not present

## 2018-08-24 DIAGNOSIS — M170 Bilateral primary osteoarthritis of knee: Secondary | ICD-10-CM | POA: Diagnosis not present

## 2018-08-31 DIAGNOSIS — N183 Chronic kidney disease, stage 3 (moderate): Secondary | ICD-10-CM | POA: Diagnosis not present

## 2018-08-31 DIAGNOSIS — M170 Bilateral primary osteoarthritis of knee: Secondary | ICD-10-CM | POA: Diagnosis not present

## 2018-08-31 DIAGNOSIS — E1169 Type 2 diabetes mellitus with other specified complication: Secondary | ICD-10-CM | POA: Diagnosis not present

## 2018-08-31 DIAGNOSIS — M6281 Muscle weakness (generalized): Secondary | ICD-10-CM | POA: Diagnosis not present

## 2018-08-31 DIAGNOSIS — I129 Hypertensive chronic kidney disease with stage 1 through stage 4 chronic kidney disease, or unspecified chronic kidney disease: Secondary | ICD-10-CM | POA: Diagnosis not present

## 2018-08-31 DIAGNOSIS — M25562 Pain in left knee: Secondary | ICD-10-CM | POA: Diagnosis not present

## 2018-08-31 DIAGNOSIS — R2689 Other abnormalities of gait and mobility: Secondary | ICD-10-CM | POA: Diagnosis not present

## 2018-08-31 DIAGNOSIS — I700 Atherosclerosis of aorta: Secondary | ICD-10-CM | POA: Diagnosis not present

## 2018-08-31 DIAGNOSIS — M25561 Pain in right knee: Secondary | ICD-10-CM | POA: Diagnosis not present

## 2018-09-04 DIAGNOSIS — M6281 Muscle weakness (generalized): Secondary | ICD-10-CM | POA: Diagnosis not present

## 2018-09-04 DIAGNOSIS — M170 Bilateral primary osteoarthritis of knee: Secondary | ICD-10-CM | POA: Diagnosis not present

## 2018-09-04 DIAGNOSIS — R2689 Other abnormalities of gait and mobility: Secondary | ICD-10-CM | POA: Diagnosis not present

## 2018-09-05 DIAGNOSIS — N390 Urinary tract infection, site not specified: Secondary | ICD-10-CM | POA: Diagnosis not present

## 2018-09-11 DIAGNOSIS — M6281 Muscle weakness (generalized): Secondary | ICD-10-CM | POA: Diagnosis not present

## 2018-09-11 DIAGNOSIS — M170 Bilateral primary osteoarthritis of knee: Secondary | ICD-10-CM | POA: Diagnosis not present

## 2018-09-11 DIAGNOSIS — R2689 Other abnormalities of gait and mobility: Secondary | ICD-10-CM | POA: Diagnosis not present

## 2018-09-13 DIAGNOSIS — M6281 Muscle weakness (generalized): Secondary | ICD-10-CM | POA: Diagnosis not present

## 2018-09-13 DIAGNOSIS — M170 Bilateral primary osteoarthritis of knee: Secondary | ICD-10-CM | POA: Diagnosis not present

## 2018-09-13 DIAGNOSIS — R2689 Other abnormalities of gait and mobility: Secondary | ICD-10-CM | POA: Diagnosis not present

## 2018-09-18 DIAGNOSIS — M6281 Muscle weakness (generalized): Secondary | ICD-10-CM | POA: Diagnosis not present

## 2018-09-18 DIAGNOSIS — R2689 Other abnormalities of gait and mobility: Secondary | ICD-10-CM | POA: Diagnosis not present

## 2018-09-18 DIAGNOSIS — M170 Bilateral primary osteoarthritis of knee: Secondary | ICD-10-CM | POA: Diagnosis not present

## 2018-09-20 DIAGNOSIS — M6281 Muscle weakness (generalized): Secondary | ICD-10-CM | POA: Diagnosis not present

## 2018-09-20 DIAGNOSIS — M170 Bilateral primary osteoarthritis of knee: Secondary | ICD-10-CM | POA: Diagnosis not present

## 2018-09-20 DIAGNOSIS — R2689 Other abnormalities of gait and mobility: Secondary | ICD-10-CM | POA: Diagnosis not present

## 2018-09-25 DIAGNOSIS — R2689 Other abnormalities of gait and mobility: Secondary | ICD-10-CM | POA: Diagnosis not present

## 2018-09-25 DIAGNOSIS — M170 Bilateral primary osteoarthritis of knee: Secondary | ICD-10-CM | POA: Diagnosis not present

## 2018-09-25 DIAGNOSIS — M6281 Muscle weakness (generalized): Secondary | ICD-10-CM | POA: Diagnosis not present

## 2018-09-27 DIAGNOSIS — M6281 Muscle weakness (generalized): Secondary | ICD-10-CM | POA: Diagnosis not present

## 2018-09-27 DIAGNOSIS — M170 Bilateral primary osteoarthritis of knee: Secondary | ICD-10-CM | POA: Diagnosis not present

## 2018-09-27 DIAGNOSIS — R2689 Other abnormalities of gait and mobility: Secondary | ICD-10-CM | POA: Diagnosis not present

## 2018-09-28 DIAGNOSIS — I129 Hypertensive chronic kidney disease with stage 1 through stage 4 chronic kidney disease, or unspecified chronic kidney disease: Secondary | ICD-10-CM | POA: Diagnosis not present

## 2018-09-28 DIAGNOSIS — E7800 Pure hypercholesterolemia, unspecified: Secondary | ICD-10-CM | POA: Diagnosis not present

## 2018-09-28 DIAGNOSIS — F5101 Primary insomnia: Secondary | ICD-10-CM | POA: Diagnosis not present

## 2018-09-28 DIAGNOSIS — N183 Chronic kidney disease, stage 3 (moderate): Secondary | ICD-10-CM | POA: Diagnosis not present

## 2018-09-28 DIAGNOSIS — E1169 Type 2 diabetes mellitus with other specified complication: Secondary | ICD-10-CM | POA: Diagnosis not present

## 2018-10-02 DIAGNOSIS — R2689 Other abnormalities of gait and mobility: Secondary | ICD-10-CM | POA: Diagnosis not present

## 2018-10-02 DIAGNOSIS — M6281 Muscle weakness (generalized): Secondary | ICD-10-CM | POA: Diagnosis not present

## 2018-10-02 DIAGNOSIS — M170 Bilateral primary osteoarthritis of knee: Secondary | ICD-10-CM | POA: Diagnosis not present

## 2018-10-04 DIAGNOSIS — R2689 Other abnormalities of gait and mobility: Secondary | ICD-10-CM | POA: Diagnosis not present

## 2018-10-04 DIAGNOSIS — M6281 Muscle weakness (generalized): Secondary | ICD-10-CM | POA: Diagnosis not present

## 2018-10-04 DIAGNOSIS — M170 Bilateral primary osteoarthritis of knee: Secondary | ICD-10-CM | POA: Diagnosis not present

## 2019-01-25 DIAGNOSIS — N183 Chronic kidney disease, stage 3 (moderate): Secondary | ICD-10-CM | POA: Diagnosis not present

## 2019-01-25 DIAGNOSIS — M25561 Pain in right knee: Secondary | ICD-10-CM | POA: Diagnosis not present

## 2019-01-25 DIAGNOSIS — M25562 Pain in left knee: Secondary | ICD-10-CM | POA: Diagnosis not present

## 2019-01-25 DIAGNOSIS — I700 Atherosclerosis of aorta: Secondary | ICD-10-CM | POA: Diagnosis not present

## 2019-02-02 DIAGNOSIS — M170 Bilateral primary osteoarthritis of knee: Secondary | ICD-10-CM | POA: Diagnosis not present

## 2019-03-09 ENCOUNTER — Emergency Department (HOSPITAL_COMMUNITY): Payer: Medicare Other

## 2019-03-09 ENCOUNTER — Other Ambulatory Visit: Payer: Self-pay

## 2019-03-09 ENCOUNTER — Encounter (HOSPITAL_COMMUNITY): Payer: Self-pay

## 2019-03-09 ENCOUNTER — Observation Stay (HOSPITAL_COMMUNITY): Payer: Medicare Other

## 2019-03-09 ENCOUNTER — Observation Stay (HOSPITAL_COMMUNITY)
Admission: EM | Admit: 2019-03-09 | Discharge: 2019-03-10 | Disposition: A | Discharge status: Home / Self Care | Payer: Medicare Other | Attending: Internal Medicine | Admitting: Internal Medicine

## 2019-03-09 DIAGNOSIS — Z7982 Long term (current) use of aspirin: Secondary | ICD-10-CM | POA: Insufficient documentation

## 2019-03-09 DIAGNOSIS — I6782 Cerebral ischemia: Secondary | ICD-10-CM | POA: Diagnosis not present

## 2019-03-09 DIAGNOSIS — Z1159 Encounter for screening for other viral diseases: Secondary | ICD-10-CM | POA: Insufficient documentation

## 2019-03-09 DIAGNOSIS — I119 Hypertensive heart disease without heart failure: Secondary | ICD-10-CM | POA: Insufficient documentation

## 2019-03-09 DIAGNOSIS — I2510 Atherosclerotic heart disease of native coronary artery without angina pectoris: Secondary | ICD-10-CM | POA: Diagnosis not present

## 2019-03-09 DIAGNOSIS — Z888 Allergy status to other drugs, medicaments and biological substances status: Secondary | ICD-10-CM | POA: Insufficient documentation

## 2019-03-09 DIAGNOSIS — K439 Ventral hernia without obstruction or gangrene: Secondary | ICD-10-CM | POA: Insufficient documentation

## 2019-03-09 DIAGNOSIS — D469 Myelodysplastic syndrome, unspecified: Secondary | ICD-10-CM | POA: Diagnosis not present

## 2019-03-09 DIAGNOSIS — I25119 Atherosclerotic heart disease of native coronary artery with unspecified angina pectoris: Secondary | ICD-10-CM | POA: Diagnosis not present

## 2019-03-09 DIAGNOSIS — R079 Chest pain, unspecified: Secondary | ICD-10-CM | POA: Diagnosis present

## 2019-03-09 DIAGNOSIS — Z7902 Long term (current) use of antithrombotics/antiplatelets: Secondary | ICD-10-CM | POA: Insufficient documentation

## 2019-03-09 DIAGNOSIS — R200 Anesthesia of skin: Secondary | ICD-10-CM | POA: Diagnosis not present

## 2019-03-09 DIAGNOSIS — E785 Hyperlipidemia, unspecified: Secondary | ICD-10-CM | POA: Diagnosis present

## 2019-03-09 DIAGNOSIS — Z79899 Other long term (current) drug therapy: Secondary | ICD-10-CM | POA: Diagnosis not present

## 2019-03-09 DIAGNOSIS — J3489 Other specified disorders of nose and nasal sinuses: Secondary | ICD-10-CM | POA: Diagnosis not present

## 2019-03-09 DIAGNOSIS — R296 Repeated falls: Secondary | ICD-10-CM | POA: Diagnosis not present

## 2019-03-09 DIAGNOSIS — G459 Transient cerebral ischemic attack, unspecified: Principal | ICD-10-CM | POA: Diagnosis present

## 2019-03-09 DIAGNOSIS — R29810 Facial weakness: Secondary | ICD-10-CM | POA: Diagnosis not present

## 2019-03-09 DIAGNOSIS — M47812 Spondylosis without myelopathy or radiculopathy, cervical region: Secondary | ICD-10-CM | POA: Diagnosis not present

## 2019-03-09 DIAGNOSIS — I517 Cardiomegaly: Secondary | ICD-10-CM | POA: Diagnosis not present

## 2019-03-09 DIAGNOSIS — Z961 Presence of intraocular lens: Secondary | ICD-10-CM | POA: Diagnosis not present

## 2019-03-09 DIAGNOSIS — M6281 Muscle weakness (generalized): Secondary | ICD-10-CM | POA: Diagnosis not present

## 2019-03-09 DIAGNOSIS — Z20828 Contact with and (suspected) exposure to other viral communicable diseases: Secondary | ICD-10-CM | POA: Diagnosis not present

## 2019-03-09 DIAGNOSIS — M1990 Unspecified osteoarthritis, unspecified site: Secondary | ICD-10-CM | POA: Insufficient documentation

## 2019-03-09 DIAGNOSIS — R2681 Unsteadiness on feet: Secondary | ICD-10-CM | POA: Diagnosis not present

## 2019-03-09 DIAGNOSIS — R531 Weakness: Secondary | ICD-10-CM | POA: Diagnosis not present

## 2019-03-09 DIAGNOSIS — I499 Cardiac arrhythmia, unspecified: Secondary | ICD-10-CM | POA: Diagnosis not present

## 2019-03-09 DIAGNOSIS — I10 Essential (primary) hypertension: Secondary | ICD-10-CM | POA: Diagnosis present

## 2019-03-09 DIAGNOSIS — K449 Diaphragmatic hernia without obstruction or gangrene: Secondary | ICD-10-CM | POA: Diagnosis not present

## 2019-03-09 DIAGNOSIS — R9082 White matter disease, unspecified: Secondary | ICD-10-CM | POA: Diagnosis not present

## 2019-03-09 DIAGNOSIS — I440 Atrioventricular block, first degree: Secondary | ICD-10-CM | POA: Diagnosis not present

## 2019-03-09 LAB — COMPREHENSIVE METABOLIC PANEL
ALT: 22 U/L (ref 0–44)
AST: 29 U/L (ref 15–41)
Albumin: 3.6 g/dL (ref 3.5–5.0)
Alkaline Phosphatase: 77 U/L (ref 38–126)
Anion gap: 9 (ref 5–15)
BUN: 29 mg/dL — ABNORMAL HIGH (ref 8–23)
CO2: 26 mmol/L (ref 22–32)
Calcium: 9.8 mg/dL (ref 8.9–10.3)
Chloride: 105 mmol/L (ref 98–111)
Creatinine, Ser: 1.05 mg/dL — ABNORMAL HIGH (ref 0.44–1.00)
GFR calc Af Amer: 55 mL/min — ABNORMAL LOW (ref >60)
GFR calc non Af Amer: 47 mL/min — ABNORMAL LOW (ref >60)
Glucose, Bld: 101 mg/dL — ABNORMAL HIGH (ref 70–99)
Potassium: 4.4 mmol/L (ref 3.5–5.1)
Sodium: 140 mmol/L (ref 135–145)
Total Bilirubin: 0.5 mg/dL (ref 0.3–1.2)
Total Protein: 5.9 g/dL — ABNORMAL LOW (ref 6.5–8.1)

## 2019-03-09 LAB — APTT: aPTT: 32 seconds (ref 24–36)

## 2019-03-09 LAB — RAPID URINE DRUG SCREEN, HOSP PERFORMED
Amphetamines: NOT DETECTED
Barbiturates: NOT DETECTED
Benzodiazepines: NOT DETECTED
Cocaine: NOT DETECTED
Opiates: NOT DETECTED
Tetrahydrocannabinol: NOT DETECTED

## 2019-03-09 LAB — CBC
HCT: 45.2 % (ref 36.0–46.0)
Hemoglobin: 14.6 g/dL (ref 12.0–15.0)
MCH: 32 pg (ref 26.0–34.0)
MCHC: 32.3 g/dL (ref 30.0–36.0)
MCV: 99.1 fL (ref 80.0–100.0)
Platelets: 226 10*3/uL (ref 150–400)
RBC: 4.56 MIL/uL (ref 3.87–5.11)
RDW: 14.8 % (ref 11.5–15.5)
WBC: 6.7 10*3/uL (ref 4.0–10.5)
nRBC: 0 % (ref 0.0–0.2)

## 2019-03-09 LAB — ETHANOL: Alcohol, Ethyl (B): <10 mg/dL (ref <10)

## 2019-03-09 LAB — URINALYSIS, ROUTINE W REFLEX MICROSCOPIC
Bilirubin Urine: NEGATIVE
Glucose, UA: NEGATIVE mg/dL
Hgb urine dipstick: NEGATIVE
Ketones, ur: NEGATIVE mg/dL
Nitrite: POSITIVE — AB
Protein, ur: NEGATIVE mg/dL
Specific Gravity, Urine: 1.014 (ref 1.005–1.030)
pH: 6 (ref 5.0–8.0)

## 2019-03-09 LAB — SARS CORONAVIRUS 2 BY RT PCR (HOSPITAL ORDER, PERFORMED IN ~~LOC~~ HOSPITAL LAB): SARS Coronavirus 2: NEGATIVE

## 2019-03-09 LAB — DIFFERENTIAL
Abs Immature Granulocytes: 0.03 10*3/uL (ref 0.00–0.07)
Basophils Absolute: 0 10*3/uL (ref 0.0–0.1)
Basophils Relative: 0 %
Eosinophils Absolute: 0.1 10*3/uL (ref 0.0–0.5)
Eosinophils Relative: 2 %
Immature Granulocytes: 0 %
Lymphocytes Relative: 22 %
Lymphs Abs: 1.4 10*3/uL (ref 0.7–4.0)
Monocytes Absolute: 0.9 10*3/uL (ref 0.1–1.0)
Monocytes Relative: 13 %
Neutro Abs: 4.3 10*3/uL (ref 1.7–7.7)
Neutrophils Relative %: 63 %

## 2019-03-09 LAB — PROTIME-INR
INR: 1.1 (ref 0.8–1.2)
Prothrombin Time: 13.8 seconds (ref 11.4–15.2)

## 2019-03-09 MED ORDER — STROKE: EARLY STAGES OF RECOVERY BOOK
Freq: Once | Status: AC
  Administered 2019-03-10: 07:00:00
  Filled 2019-03-09: qty 1

## 2019-03-09 MED ORDER — ACETAMINOPHEN 325 MG PO TABS
650.0000 mg | ORAL_TABLET | ORAL | Status: DC | PRN
  Administered 2019-03-10: 650 mg via ORAL
  Filled 2019-03-09: qty 2

## 2019-03-09 MED ORDER — ACETAMINOPHEN 650 MG RE SUPP: 650.0000 mg | RECTAL | Status: DC | PRN

## 2019-03-09 MED ORDER — ACETAMINOPHEN 160 MG/5ML PO SOLN: 650.0000 mg | ORAL | Status: DC | PRN

## 2019-03-09 MED ORDER — SODIUM CHLORIDE 0.9 % IV SOLN
1.0000 g | Freq: Once | INTRAVENOUS | Status: DC
  Filled 2019-03-09: qty 10

## 2019-03-09 MED ORDER — ASPIRIN 300 MG RE SUPP: 300.0000 mg | Freq: Every day | RECTAL | Status: DC

## 2019-03-09 MED ORDER — SODIUM CHLORIDE 0.9 % IV SOLN
INTRAVENOUS | Status: DC
  Administered 2019-03-09 – 2019-03-10 (×2): via INTRAVENOUS

## 2019-03-09 MED ORDER — SENNOSIDES-DOCUSATE SODIUM 8.6-50 MG PO TABS: 1.0000 | ORAL_TABLET | Freq: Every evening | ORAL | Status: DC | PRN

## 2019-03-09 MED ORDER — ASPIRIN 325 MG PO TABS: 325.0000 mg | ORAL_TABLET | Freq: Every day | ORAL | Status: DC

## 2019-03-09 NOTE — ED Provider Notes (Signed)
[facility]ER[facility]ENC[facility] DEPARTMENT Provider Note   CSN: [id number] Arrival date & time: 03/09/19  1716     History   Chief Complaint Chief Complaint  Patient presents with  . Weakness    HPI [name] is a 85 y.o. female.  She is presenting from home by ambulance for evaluation of 20 minutes of left arm numbness along with some numbness around the left side of her face that started at 4 PM.  She is never had this before.  No headache blurry vision double vision difficulty speaking.  No leg symptoms.     The history is provided by the patient.  Cerebrovascular Accident This is a new problem. The current episode started 1 to 2 hours ago. The problem has been resolved. Pertinent negatives include no chest pain, no abdominal pain, no headaches and no shortness of breath. Nothing aggravates the symptoms. Nothing relieves the symptoms. She has tried nothing for the symptoms. The treatment provided significant relief.    Past Medical History:  Diagnosis Date  . Arthritis   . CAD (coronary artery disease)    a. Mod prox LAD & first diagonal stenosis by cath 2002 - managed medically since that time with normal LV function.  . Cataract   . Hyperlipidemia   . Hypertension     Patient Active Problem List   Diagnosis Date Noted  . Pressure injury of skin 05/21/2018  . Incarcerated incisional hernia - reduced 05/19/2018  . SBO (small bowel obstruction) ([place]) 05/18/2018  . Bilateral primary osteoarthritis of knee 07/22/2017  . Hyperlipidemia 05/20/2014  . Hypotension 08/02/2013  . UTI (lower urinary tract infection) 08/02/2013  . Acute renal failure ([place]) 08/02/2013  . ARF (acute renal failure) ([place]) 08/02/2013  . Calculus of gallbladder with acute cholecystitis, without mention of obstruction 07/10/2013  . Acute cholecystitis 06/17/2013  . Chest pain 06/17/2013  . Pulmonary infiltrate in left lung on chest x-ray 06/17/2013  . Hypertension   . CAD (coronary  artery disease)     Past Surgical History:  Procedure Laterality Date  . ABDOMINAL HYSTERECTOMY    . APPENDECTOMY    . BACK SURGERY    . CARPAL TUNNEL RELEASE    . CHOLECYSTECTOMY N/A 06/19/2013   Procedure: LAPAROSCOPIC CHOLECYSTECTOMY;  Surgeon: Harl Bowie, MD;  Location: [facility];  Service: General;  Laterality: N/A;  . ESOPHAGOGASTRODUODENOSCOPY N/A 04/26/2017   Procedure: ESOPHAGOGASTRODUODENOSCOPY (EGD) W/ DILATION;  Surgeon: Ronnette Juniper, MD;  Location: [facility];  Service: Gastroenterology;  Laterality: N/A;  . HERNIA REPAIR    . INSERTION OF MESH  05/19/2018   Procedure: INSERTION OF MESH;  Surgeon: Excell Seltzer, MD;  Location: [facility];  Service: General;;  . IR GENERIC HISTORICAL  03/18/2016   IR RADIOLOGIST EVAL & MGMT 03/18/2016 Corrie Mckusick, DO [facility] INTERV RAD  . KNEE SURGERY    . SHOULDER SURGERY    . SPLENECTOMY, TOTAL    . VENTRAL HERNIA REPAIR N/A 05/19/2018   Procedure: HERNIA REPAIR VENTRAL ADULT;  Surgeon: Excell Seltzer, MD;  Location: [facility];  Service: General;  Laterality: N/A;     OB History   No obstetric history on file.      Home Medications    Prior to Admission medications   Medication Sig Start Date End Date Taking? Authorizing Provider  acetaminophen (TYLENOL 8 HOUR ARTHRITIS PAIN) 650 MG CR tablet Take 650 mg by mouth every 8 (eight) hours as needed for pain.    [provider]  ALPRAZolam (XANAX) 0.5 MG tablet Take 0.25-0.5 mg by mouth at bedtime as needed for sleep. Reported on 09/07/2015    [provider]  aspirin EC 81 MG tablet Take 81 mg by mouth daily.    [provider]  feeding supplement, ENSURE ENLIVE, (ENSURE ENLIVE) LIQD Take 237 mLs by mouth 2 (two) times daily between meals. 05/25/18   Hosie Poisson, MD  hydrALAZINE (APRESOLINE) 10 MG tablet Take 1 tablet (10 mg total) by mouth every 8 (eight) hours. 05/25/18   Hosie Poisson, MD  hydrochlorothiazide (HYDRODIURIL) 25 MG tablet Take 25 mg by  mouth daily.    [provider]  nutrition supplement, JUVEN, (JUVEN) PACK Take 1 packet by mouth 2 (two) times daily between meals. 05/25/18   Hosie Poisson, MD  Ondansetron HCl (ZOFRAN PO) Take 1 tablet by mouth daily as needed (nausea).    [provider]  Polyethyl Glycol-Propyl Glycol (SYSTANE OP) Place 1 drop into both eyes 2 (two) times daily.     [provider]  polyethylene glycol powder (GLYCOLAX/MIRALAX) powder Take 17 g by mouth daily as needed (for constipation).    [provider]  traMADol (ULTRAM) 50 MG tablet Take 1 tablet (50 mg total) by mouth every 6 (six) hours as needed for moderate pain (mild pain, breakthrough). 05/25/18   Hosie Poisson, MD    Family History Family History  Problem Relation Age of Onset  . Heart disease Mother        "enlarged valve"  . Cancer Mother        Ovarian  . Stroke Father   . Cancer Sister        Colon    Social History Social History   Tobacco Use  . Smoking status: Never Smoker  . Smokeless tobacco: Never Used  Substance Use Topics  . Alcohol use: No  . Drug use: No     Allergies   Felodipine, Prednisone, Shrimp [shellfish allergy], Statins, and Omeprazole   Review of Systems Review of Systems  Constitutional: Negative for fever.  HENT: Negative for sore throat.   Eyes: Negative for visual disturbance.  Respiratory: Negative for shortness of breath.   Cardiovascular: Negative for chest pain.  Gastrointestinal: Negative for abdominal pain.  Genitourinary: Negative for dysuria.  Musculoskeletal: Negative for neck pain.  Skin: Negative for rash.  Neurological: Positive for numbness. Negative for speech difficulty and headaches.     Physical Exam Updated Vital Signs BP (!) 189/68 (BP Location: Left Arm)   Pulse (!) 54   Temp 98.3 F (36.8 C) (Oral)   Resp 15   SpO2 99%   Physical Exam Vitals signs and nursing note reviewed.  Constitutional:      General: She is not in  acute distress.    Appearance: She is well-developed.  HENT:     Head: Normocephalic and atraumatic.  Eyes:     Conjunctiva/sclera: Conjunctivae normal.  Neck:     Musculoskeletal: Neck supple.  Cardiovascular:     Rate and Rhythm: Normal rate and regular rhythm.     Heart sounds: No murmur.  Pulmonary:     Effort: Pulmonary effort is normal. No respiratory distress.     Breath sounds: Normal breath sounds.  Abdominal:     Palpations: Abdomen is soft.     Tenderness: There is no abdominal tenderness.  Musculoskeletal: Normal range of motion.     Right lower leg: No edema.     Left lower leg: No edema.  Skin:  General: Skin is warm and dry.     Capillary Refill: Capillary refill takes less than 2 seconds.  Neurological:     General: No focal deficit present.     Mental Status: She is alert and oriented to person, place, and time.     Cranial Nerves: No cranial nerve deficit.     Sensory: No sensory deficit.     Motor: No weakness.     Coordination: Coordination normal.      ED Treatments / Results  Labs (all labs ordered are listed, but only abnormal results are displayed) Labs Reviewed  COMPREHENSIVE METABOLIC PANEL - Abnormal; Notable for the following components:      Result Value   Glucose, Bld 101 (*)    BUN 29 (*)    Creatinine, Ser 1.05 (*)    Total Protein 5.9 (*)    GFR calc non Af Amer 47 (*)    GFR calc Af Amer 55 (*)    All other components within normal limits  URINALYSIS, ROUTINE W REFLEX MICROSCOPIC - Abnormal; Notable for the following components:   Nitrite POSITIVE (*)    Leukocytes,Ua SMALL (*)    Bacteria, UA RARE (*)    All other components within normal limits  HEMOGLOBIN A1C - Abnormal; Notable for the following components:   Hgb A1c MFr Bld 6.2 (*)    All other components within normal limits  LIPID PANEL - Abnormal; Notable for the following components:   Cholesterol 205 (*)    LDL Cholesterol 138 (*)    All other components within  normal limits  SARS CORONAVIRUS 2 (HOSPITAL ORDER, [facility])  URINE CULTURE  ETHANOL  PROTIME-INR  APTT  CBC  DIFFERENTIAL  RAPID URINE DRUG SCREEN, HOSP PERFORMED  TROPONIN I (HIGH SENSITIVITY)  TROPONIN I (HIGH SENSITIVITY)    EKG EKG Interpretation  Date/Time:  Friday March 09 2019 18:00:22 EDT Ventricular Rate:  56 PR Interval:    QRS Duration: 98 QT Interval:  399 QTC Calculation: 385 R Axis:   114 Text Interpretation:  Sinus rhythm Atrial premature complexes in couplets Prolonged PR interval Probable right ventricular hypertrophy similar to prior 10/19 Confirmed by Aletta Edouard [phone]) on 03/09/2019 6:13:03 PM   Radiology Mr Brain Wo Contrast (neuro Protocol)  Result Date: 03/09/2019 CLINICAL DATA:  Episode of left upper extremity and facial numbness lasting 20 minutes. TIA, initial encounter. EXAM: MRI HEAD WITHOUT CONTRAST TECHNIQUE: Multiplanar, multiecho pulse sequences of the brain and surrounding structures were obtained without intravenous contrast. COMPARISON:  Report of CT head 04/14/2001. Images are no longer available. FINDINGS: Brain: Diffusion-weighted images demonstrate no acute or subacute infarction. Mild atrophy and white matter changes are within normal limits for age. A remote lacunar infarct is present within the left thalamus. Basal ganglia are normal. The internal auditory canals are within normal limits. The brainstem and cerebellum are within normal limits. Vascular: Flow is present in the major intracranial arteries. Skull and upper cervical spine: Mild degenerative changes are present the upper cervical spine. Craniocervical junction is normal. Midline structures are unremarkable. Sinuses/Orbits: Mild mucosal thickening is present within anterior ethmoid air cells. The paranasal sinuses and mastoid air cells are otherwise clear. Bilateral lens replacements are noted. Globes and orbits are otherwise unremarkable. IMPRESSION: 1.  No acute intracranial abnormality. 2. Remote lacunar infarct of the left thalamus. 3. MRI brain is otherwise normal for age. Electronically Signed   By: San Morelle M.D.   On: 03/09/2019 19:31  Dg Chest Port 1 View  Result Date: 03/09/2019 CLINICAL DATA:  TIA workup EXAM: PORTABLE CHEST 1 VIEW COMPARISON:  05/18/2018 FINDINGS: Cardiomegaly. Hiatal hernia. Both lungs are clear. The visualized skeletal structures are unremarkable. IMPRESSION: Cardiomegaly without acute abnormality of the lungs.  Hiatal hernia. Electronically Signed   By: Eddie Candle M.D.   On: 03/09/2019 21:25    Procedures Procedures (including critical care time)  Medications Ordered in ED Medications - No data to display   Initial Impression / Assessment and Plan / ED Course  I have reviewed the triage vital signs and the nursing notes.  Pertinent labs & imaging results that were available during my care of the patient were reviewed by me and considered in my medical decision making (see chart for details).  Clinical Course as of Mar 09 1915  Fri Mar 09, 3263  [id number] 85 year old female here with 20 minutes of left arm and left side of face numbness.  Complete resolution of her symptoms.  No identifiable neurologic defects at this time.  She is getting labs EKG and MRI brain.   [MB]    Clinical Course User Index [MB] Hayden Rasmussen, MD   [name] was evaluated in Emergency Department on 03/09/2019 for the symptoms described in the history of present illness. She was evaluated in the context of the global COVID-19 pandemic, which necessitated consideration that the patient might be at risk for infection with the SARS-CoV-2 virus that causes COVID-19. Institutional protocols and algorithms that pertain to the evaluation of patients at risk for COVID-19 are in a state of rapid change based on information released by regulatory bodies including the CDC and federal and state organizations. These policies and  algorithms were followed during the patient's care in the ED.      Final Clinical Impressions(s) / ED Diagnoses   Final diagnoses:  TIA (transient ischemic attack)    ED Discharge Orders    None       Hayden Rasmussen, MD 03/10/19 [phone]

## 2019-03-09 NOTE — ED Notes (Signed)
Patient transported to MRI 

## 2019-03-09 NOTE — ED Notes (Signed)
Pt back from MRI 

## 2019-03-09 NOTE — ED Triage Notes (Signed)
Pt arrives via [facility] after L sided numbess that lasted approximatey 20 mins. Neuro intact with [facility] arrival and neuro intact on initially assessment. Pt has no hx of stroke or MI, denies SOB, CP

## 2019-03-09 NOTE — Consult Note (Signed)
Neurology Consultation Reason for Consult: Left-sided numbness Referring Physician: Carin Hock  CC: Left-sided numbness  History is obtained from: Patient  HPI: [name] is a 85 y.o. female with a history of hypertension, hyperlipidemia, coronary artery disease who had a transient episode of left arm and face numbness which occurred at 4 PM.  She states it lasted for approximately 20 minutes.  She did not notice any weakness, visual change, vertigo, nausea, or other symptoms.   LKW: 4 PM tpa given?: no, resolved symptoms  ROS: A 14 point ROS was performed and is negative except as noted in the HPI.   Past Medical History:  Diagnosis Date  . Arthritis   . CAD (coronary artery disease)    a. Mod prox LAD & first diagonal stenosis by cath 2002 - managed medically since that time with normal LV function.  . Cataract   . Hyperlipidemia   . Hypertension      Family History  Problem Relation Age of Onset  . Heart disease Mother        "enlarged valve"  . Cancer Mother        Ovarian  . Stroke Father   . Cancer Sister        Colon     Social History:  reports that she has never smoked. She has never used smokeless tobacco. She reports that she does not drink alcohol or use drugs.   Exam: Current vital signs: BP (!) 204/71   Pulse (!) 55   Temp 98.3 F (36.8 C) (Oral)   Resp 16   SpO2 99%  Vital signs in last 24 hours: Temp:  [98.3 F (36.8 C)] 98.3 F (36.8 C) (07/24 1800) Pulse Rate:  [55-66] 55 (07/24 2030) Resp:  [14-23] 16 (07/24 2030) BP: (177-204)/(68-79) 204/71 (07/24 2030) SpO2:  [97 %-99 %] 99 % (07/24 2030)   Physical Exam  Constitutional: Appears well-developed and well-nourished.  Psych: Affect appropriate to situation Eyes: No scleral injection HENT: No OP obstrucion Head: Normocephalic.  Cardiovascular: Normal rate and regular rhythm.  Respiratory: Effort normal, non-labored breathing GI: Soft.  No distension. There is no tenderness.   Skin: WDI  Neuro: Mental Status: Patient is awake, alert, oriented to person, place, month, year, and situation. Patient is able to give a clear and coherent history. No signs of aphasia or neglect Cranial Nerves: II: Visual Fields are full. Pupils are equal, round, and reactive to light.   III,IV, VI: EOMI without ptosis or diploplia.  V: Facial sensation is symmetric to temperature VII: Facial movement is symmetric.  VIII: She is hard of hearing X: Uvula elevates symmetrically XI: Shoulder shrug is symmetric. XII: tongue is midline without atrophy or fasciculations.  Motor: Tone is normal. Bulk is normal. 5/5 strength was present in all four extremities.  Sensory: Sensation is symmetric to light touch and temperature in the arms and legs. Cerebellar: FNF  intact bilaterally   I have reviewed labs in epic and the results pertinent to this consultation are: Creatinine 1.05 CBC-normal  I have reviewed the images obtained: MR head- no acute findings, remote infarct of the left thalamus.  Impression: 85 year old female with transient left-sided numbness of the face and arm most consistent with transient ischemic attack.  She will need to be admitted for secondary factor modification.  Recommendations: - HgbA1c, fasting lipid panel - MRI of the brain without contrast - Frequent neuro checks - Prophylactic therapy-Antiplatelet med: Aspirin - dose 325mg  PO or 300mg  PR -  Risk factor modification - Telemetry monitoring - PT consult, OT consult, Speech consult - Stroke team to follow  Roland Rack, MD [facility] [phone]  If 7pm- 7am, please page neurology on call as listed in Roland.

## 2019-03-09 NOTE — ED Notes (Signed)
Urine culture sent down with urine sample 

## 2019-03-09 NOTE — H&P (Signed)
[name] MVH:[id number] DOB: [birth year] DOA: 03/09/2019    PCP: Lajean Manes, MD   Outpatient Specialists:    GI  Dr. Therisa Doyne Sadie Haber, ) Oncology Dr. Alen Blew  Patient arrived to ER on 03/09/19 at 1716  Patient coming from:   From facility at [facility]  Chief Complaint:  Chief Complaint  Patient presents with  . Weakness    HPI: [name] is a 85 y.o. female with medical history significant of  coronary artery disease, hypertension and hyperlipidemia,  severe hiatal hernia as well as ventral abdominal wall hernia. 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    Presented with left arm numbness and left facial numbness started at 4 PM no associated headache or blurry vision no difficulty with speech no difficulty with ambulation Resolved by the time patient arrived to emergency department  And also endorsing some mild left-sided chest pressure currently improving No associated shortness of breath no nausea no vomiting no diarrhea  Patient denies any dysuria or frequency in urination or abdominal pain  Infectious risk factors:  Reports Neurological complaints   In  ER RAPID COVID TEST NEGATIVE    Regarding pertinent Chronic problems:     Hyperlipidemia - not on statins   HTN on  Hydralazine, HCTZ    CAD  - On Aspirin,                                - last cardiac cath 2012 Medically managed  While in ER:  The following Work up has been ordered so far:  Orders Placed This Encounter  Procedures  . SARS Coronavirus 2 (CEPHEID - Performed in [place] hospital lab), [facility]  . MR Brain Wo Contrast (neuro protocol)  . Ethanol  . Protime-INR  . APTT  . CBC  . Differential  . Comprehensive metabolic panel  . Urine rapid drug screen (hosp performed)  . Urinalysis, Routine w reflex microscopic  . Diet NPO time specified  . Vital signs  . Cardiac Monitoring  . Modified Stroke Scale (mNIHSS) Document  mNIHSS assessment every 2 hours for a total of 12 hours  . Stroke swallow screen  . Initiate Carrier Fluid Protocol  . If O2 sat If O2 Sat < 94%, administer O2 at 2 liters/minute via nasal cannula.  . Consult to neurology  ALL PATIENTS BEING ADMITTED/HAVING PROCEDURES NEED COVID-19 SCREENING  . Consult to hospitalist  ALL PATIENTS BEING ADMITTED/HAVING PROCEDURES NEED COVID-19 SCREENING  . Pulse oximetry, continuous  . ED EKG  . EKG 12-Lead  . Saline lock IV     Following Medications were ordered in ER: Medications - No data to display      Consult Orders  (From admission, onward)         Start     Ordered   03/09/19 1940  Consult to hospitalist  ALL PATIENTS BEING ADMITTED/HAVING PROCEDURES NEED COVID-19 SCREENING PG SENT BY DEE  Once    Comments: ALL PATIENTS BEING ADMITTED/HAVING PROCEDURES NEED COVID-19 SCREENING  Provider:  (Not yet assigned)  Question Answer Comment  Place call to: [facility]   Reason for Consult Admit      03/09/19 1939          ER Provider Called:   Neurology   Dr. Leonel Ramsay  They Recommend admit to medicine for TIA Will see   in ER  Significant initial  Findings: Abnormal Labs Reviewed  COMPREHENSIVE METABOLIC PANEL - Abnormal; Notable for the following components:      Result Value   Glucose, Bld 101 (*)    BUN 29 (*)    Creatinine, Ser 1.05 (*)    Total Protein 5.9 (*)    GFR calc non Af Amer 47 (*)    GFR calc Af Amer 55 (*)    All other components within normal limits     Otherwise labs showing:    Recent Labs  Lab 03/09/19 1820  NA 140  K 4.4  CO2 26  GLUCOSE 101*  BUN 29*  CREATININE 1.05*  CALCIUM 9.8    Cr    stable,    Lab Results  Component Value Date   CREATININE 1.05 (H) 03/09/2019   CREATININE 0.92 05/20/2018   CREATININE 0.83 05/19/2018   CREATININE 0.87 05/19/2018    Recent Labs  Lab 03/09/19 1820  AST 29  ALT 22  ALKPHOS 77  BILITOT 0.5  PROT 5.9*  ALBUMIN 3.6   Lab Results   Component Value Date   CALCIUM 9.8 03/09/2019   PHOS 3.0 05/20/2018      WBC      Component Value Date/Time   WBC 6.7 03/09/2019 1820   ANC    Component Value Date/Time   NEUTROABS 4.3 03/09/2019 1820   NEUTROABS 4.5 03/08/2017 1016   ALC No results found for: LYMPHOABS    Plt: Lab Results  Component Value Date   PLT 226 03/09/2019    Lactic Acid, Venous    Component Value Date/Time   LATICACIDVEN 1.9 05/18/2018 1020      COVID-19 Labs   No results found for: SARSCOV2NAA    HG/HCT stable,       Component Value Date/Time   HGB 14.6 03/09/2019 1820   HGB 15.5 05/10/2018 0933   HGB 10.6 (L) 03/08/2017 1016   HCT 45.2 03/09/2019 1820   HCT 32.3 (L) 03/08/2017 1016     UA   no evidence of UTI   slight leukocytosis   Urine analysis:    Component Value Date/Time   COLORURINE YELLOW 03/09/2019 1943   APPEARANCEUR CLEAR 03/09/2019 1943   LABSPEC 1.014 03/09/2019 1943   PHURINE 6.0 03/09/2019 [place] 03/09/2019 [place] 03/09/2019 [place] 03/09/2019 [place] 03/09/2019 1943   PROTEINUR NEGATIVE 03/09/2019 1943   UROBILINOGEN 0.2 08/02/2013 1342   NITRITE POSITIVE (A) 03/09/2019 1943   LEUKOCYTESUR SMALL (A) 03/09/2019 1943       CXR -cardiomegaly but no acute lung infiltrate    ECG:  Personally reviewed by me showing: HR : 56 Rhythm: NSR,    no evidence of ischemic changes QTC 385      ED Triage Vitals  Enc Vitals Group     BP 03/09/19 1800 (!) 189/68     Pulse Rate 03/09/19 1800 (!) 54     Resp 03/09/19 1800 15     Temp 03/09/19 1800 98.3 F (36.8 C)     Temp Source 03/09/19 1800 Oral     SpO2 03/09/19 1800 99 %     Weight --      Height --      Head Circumference --      Peak Flow --      Pain Score 03/09/19 1810 0     Pain Loc --      Pain Edu? --  Excl. in GC? --   TMAX(24)@       Latest  Blood pressure (!) 189/68, pulse (!) 54, temperature 98.3 F (36.8  C), temperature source Oral, resp. rate 15, SpO2 99 %.     Hospitalist was called for admission for  TIA   Review of Systems:    Pertinent positives include:  localizing neurological complaints  Constitutional:  No weight loss, night sweats, Fevers, chills, fatigue, weight loss  HEENT:  No headaches, Difficulty swallowing,Tooth/dental problems,Sore throat,  No sneezing, itching, ear ache, nasal congestion, post nasal drip,  Cardio-vascular:  No chest pain, Orthopnea, PND, anasarca, dizziness, palpitations.no Bilateral lower extremity swelling  GI:  No heartburn, indigestion, abdominal pain, nausea, vomiting, diarrhea, change in bowel habits, loss of appetite, melena, blood in stool, hematemesis Resp:  no shortness of breath at rest. No dyspnea on exertion, No excess mucus, no productive cough, No non-productive cough, No coughing up of blood.No change in color of mucus.No wheezing. Skin:  no rash or lesions. No jaundice GU:  no dysuria, change in color of urine, no urgency or frequency. No straining to urinate.  No flank pain.  Musculoskeletal:  No joint pain or no joint swelling. No decreased range of motion. No back pain.  Psych:  No change in mood or affect. No depression or anxiety. No memory loss.  Neuro: no, no tingling, no weakness, no double vision, no gait abnormality, no slurred speech, no confusion  All systems reviewed and apart from [place] all are negative  Past Medical History:   Past Medical History:  Diagnosis Date  . Arthritis   . CAD (coronary artery disease)    a. Mod prox LAD & first diagonal stenosis by cath 2002 - managed medically since that time with normal LV function.  . Cataract   . Hyperlipidemia   . Hypertension       Past Surgical History:  Procedure Laterality Date  . ABDOMINAL HYSTERECTOMY    . APPENDECTOMY    . BACK SURGERY    . CARPAL TUNNEL RELEASE    . CHOLECYSTECTOMY N/A 06/19/2013   Procedure: LAPAROSCOPIC CHOLECYSTECTOMY;   Surgeon: Harl Bowie, MD;  Location: [place];  Service: General;  Laterality: N/A;  . ESOPHAGOGASTRODUODENOSCOPY N/A 04/26/2017   Procedure: ESOPHAGOGASTRODUODENOSCOPY (EGD) W/ DILATION;  Surgeon: Ronnette Juniper, MD;  Location: [facility];  Service: Gastroenterology;  Laterality: N/A;  . HERNIA REPAIR    . INSERTION OF MESH  05/19/2018   Procedure: INSERTION OF MESH;  Surgeon: Excell Seltzer, MD;  Location: [facility];  Service: General;;  . IR GENERIC HISTORICAL  03/18/2016   IR RADIOLOGIST EVAL & MGMT 03/18/2016 Corrie Mckusick, DO [facility] INTERV RAD  . KNEE SURGERY    . SHOULDER SURGERY    . SPLENECTOMY, TOTAL    . VENTRAL HERNIA REPAIR N/A 05/19/2018   Procedure: HERNIA REPAIR VENTRAL ADULT;  Surgeon: Excell Seltzer, MD;  Location: [facility];  Service: General;  Laterality: N/A;    Social History:  Ambulatory   walker       reports that she has never smoked. She has never used smokeless tobacco. She reports that she does not drink alcohol or use drugs.   Family History:   Family History  Problem Relation Age of Onset  . Heart disease Mother        "enlarged valve"  . Cancer Mother        Ovarian  . Stroke Father   . Cancer Sister  Colon    Allergies: Allergies  Allergen Reactions  . Felodipine Anaphylaxis and Other (See Comments)    Reaction: unknown possible nausea or rash  . Prednisone   . Shrimp [Shellfish Allergy] Nausea And Vomiting  . Statins Other (See Comments)    Muscle/leg pain.  Marland Kitchen Omeprazole Other (See Comments)    Reaction: unknown possible nausea or rash     Prior to Admission medications   Medication Sig Start Date End Date Taking? Authorizing Provider  acetaminophen (TYLENOL 8 HOUR ARTHRITIS PAIN) 650 MG CR tablet Take 650 mg by mouth every 8 (eight) hours as needed for pain.    [provider]  ALPRAZolam Duanne Moron) 0.5 MG tablet Take 0.25-0.5 mg by mouth at bedtime as needed for sleep. Reported on 09/07/2015    [provider]   aspirin EC 81 MG tablet Take 81 mg by mouth daily.    [provider]  feeding supplement, ENSURE ENLIVE, (ENSURE ENLIVE) LIQD Take 237 mLs by mouth 2 (two) times daily between meals. 05/25/18   Hosie Poisson, MD  hydrALAZINE (APRESOLINE) 10 MG tablet Take 1 tablet (10 mg total) by mouth every 8 (eight) hours. 05/25/18   Hosie Poisson, MD  hydrochlorothiazide (HYDRODIURIL) 25 MG tablet Take 25 mg by mouth daily.    [provider]  nutrition supplement, JUVEN, (JUVEN) PACK Take 1 packet by mouth 2 (two) times daily between meals. 05/25/18   Hosie Poisson, MD  Ondansetron HCl (ZOFRAN PO) Take 1 tablet by mouth daily as needed (nausea).    [provider]  Polyethyl Glycol-Propyl Glycol (SYSTANE OP) Place 1 drop into both eyes 2 (two) times daily.     [provider]  polyethylene glycol powder (GLYCOLAX/MIRALAX) powder Take 17 g by mouth daily as needed (for constipation).    [provider]  traMADol (ULTRAM) 50 MG tablet Take 1 tablet (50 mg total) by mouth every 6 (six) hours as needed for moderate pain (mild pain, breakthrough). 05/25/18   Hosie Poisson, MD   Physical Exam: Blood pressure (!) 189/68, pulse (!) 54, temperature 98.3 F (36.8 C), temperature source Oral, resp. rate 15, SpO2 99 %. 1. General:  in No Acute distress   well -appearing 2. Psychological: Alert and   Oriented 3. Head/ENT:     Dry Mucous Membranes                          Head Non traumatic, neck supple                            Poor Dentition 4. SKIN: n  decreased Skin turgor,  Skin clean Dry and intact no rash 5. Heart: Regular rate and rhythm no  Murmur, no Rub or gallop 6. Lungs:  no wheezes or crackles   7. Abdomen: Soft,  non-tender, Non distended  bowel sounds present 8. Lower extremities: no clubbing, cyanosis, no  edema 9. Neurologically   strength 5 out of 5 in all 4 extremities cranial nerves II through XII intact 10. MSK: Normal range of motion   All  other LABS:     Recent Labs  Lab 03/09/19 1820  WBC 6.7  NEUTROABS 4.3  HGB 14.6  HCT 45.2  MCV 99.1  PLT 226     Recent Labs  Lab 03/09/19 1820  NA 140  K 4.4  CL 105  CO2 26  GLUCOSE 101*  BUN 29*  CREATININE 1.05*  CALCIUM 9.8     Recent Labs  Lab 03/09/19 1820  AST 29  ALT 22  ALKPHOS 77  BILITOT 0.5  PROT 5.9*  ALBUMIN 3.6       Cultures:    Component Value Date/Time   SDES URINE, CLEAN CATCH 08/02/2013 1342   SPECREQUEST NONE 08/02/2013 1342   CULT  08/02/2013 1342    ESCHERICHIA COLI Performed at [place] 08/04/2013 FINAL 08/02/2013 1342     Radiological Exams on Admission: Mr Brain Wo Contrast (neuro Protocol)  Result Date: 03/09/2019 CLINICAL DATA:  Episode of left upper extremity and facial numbness lasting 20 minutes. TIA, initial encounter. EXAM: MRI HEAD WITHOUT CONTRAST TECHNIQUE: Multiplanar, multiecho pulse sequences of the brain and surrounding structures were obtained without intravenous contrast. COMPARISON:  Report of CT head 04/14/2001. Images are no longer available. FINDINGS: Brain: Diffusion-weighted images demonstrate no acute or subacute infarction. Mild atrophy and white matter changes are within normal limits for age. A remote lacunar infarct is present within the left thalamus. Basal ganglia are normal. The internal auditory canals are within normal limits. The brainstem and cerebellum are within normal limits. Vascular: Flow is present in the major intracranial arteries. Skull and upper cervical spine: Mild degenerative changes are present the upper cervical spine. Craniocervical junction is normal. Midline structures are unremarkable. Sinuses/Orbits: Mild mucosal thickening is present within anterior ethmoid air cells. The paranasal sinuses and mastoid air cells are otherwise clear. Bilateral lens replacements are noted. Globes and orbits are otherwise unremarkable. IMPRESSION: 1. No acute intracranial  abnormality. 2. Remote lacunar infarct of the left thalamus. 3. MRI brain is otherwise normal for age. Electronically Signed   By: San Morelle M.D.   On: 03/09/2019 19:31    Chart has been reviewed    Assessment/Plan  85 y.o. female with medical history significant of  coronary artery disease, hypertension and hyperlipidemia,  severe hiatal hernia as well as ventral abdominal wall hernia. SBO in Oct 20  Admitted for TIA  Present on Admission: . TIA (transient ischemic attack) -  - will admit based on TIA/CVA protocol, Initial Stroke scale        Monitor on Tele       MRA/MRI  Resulted - showing no acute ischemic CVA        BUt given high risk per neurology would like to work up for TIA       Will order , Carotid Doppler        Echo to evaluate for possible embolic source,        obtain cardiac enzymes,  ECG,   Lipid panel, TSH.        Order PT/OT evaluation.        Deep nothing by mouth until passes swallow eval   evaluation       Will make sure patient is on antiplatelet ASA  325        Allow permissive Hypertension keep BP <220/120        Neurology consulted Have   will see pt   . Hypertension - Allow permissive HTn  . CAD (coronary artery disease) -has been medically managed Given chest pain will cycle troponins first troponin only 8 Continue to monitor . Hyperlipidemia patient not on statins will repeat lipid panel in a.m. and reassess History of myelodysplasia currently appears to be stable   Other plan as per orders.  DVT prophylaxis:  SCD    Code Status:  DNR/DNI  as per patient   I had personally discussed CODE STATUS with patient   Family Communication:   Family not at  Bedside    Disposition Plan:                             Back to current facility when stable                                              Would benefit from PT/OT eval prior to DC  Ordered                    Consults called: Neurology    Admission status:  ED Disposition    ED  Disposition Condition Comment   Admit  The patient appears reasonably stabilized for admission considering the current resources, flow, and capabilities available in the ED at this time, and I doubt any other [facility] requiring further screening and/or treatment in the ED prior to admission is  present.         Obs    Level of care     tele  For  24H       Precautions:  NONE   No active isolations  PPE: Used by the provider:   P100  eye Goggles,  Gloves      Anastassia Doutova 03/09/2019, 8:10 PM    [facility]     after 2 AM please page floor coverage PA If 7AM-7PM, please contact the day team taking care of the patient using [email or web address]

## 2019-03-09 NOTE — ED Notes (Addendum)
Son contacted on [phone](cell)

## 2019-03-09 NOTE — ED Notes (Signed)
ED TO INPATIENT HANDOFF REPORT  ED Nurse Name and Phone #: Annie Main [phone]  S Name/Age/Gender [name] 85 y.o. female Room/Bed: 040C/040C  Code Status   Code Status: Prior  Home/SNF/Other Home Patient oriented to: self, place, time and situation Is this baseline? Yes   Triage Complete: Triage complete  Chief Complaint HTN; Numbness L Arm  Triage Note Pt arrives via [facility] after L sided numbess that lasted approximatey 20 mins. Neuro intact with [facility] arrival and neuro intact on initially assessment. Pt has no hx of stroke or MI, denies SOB, CP   Allergies Allergies  Allergen Reactions  . Felodipine Anaphylaxis and Other (See Comments)    Reaction: unknown possible nausea or rash  . Prednisone   . Shrimp [Shellfish Allergy] Nausea And Vomiting  . Statins Other (See Comments)    Muscle/leg pain.  Marland Kitchen Omeprazole Other (See Comments)    Reaction: unknown possible nausea or rash    Level of Care/Admitting Diagnosis ED Disposition    ED Disposition Condition [facility] Area: [place] [100100]  Level of Care: Telemetry Medical [104]  I expect the patient will be discharged within 24 hours: No (not a candidate for 5C-Observation unit)  Covid Evaluation: Asymptomatic Screening Protocol (No Symptoms)  Diagnosis: TIA (transient ischemic attack) [384536]  Admitting Physician: [name] [[id number]]  Attending Physician: [name] [[id number]]  PT Class (Do Not Modify): Observation [104]  PT Acc Code (Do Not Modify): Observation [10022]       B Medical/Surgery History Past Medical History:  Diagnosis Date  . Arthritis   . CAD (coronary artery disease)    a. Mod prox LAD & first diagonal stenosis by cath 2002 - managed medically since that time with normal LV function.  . Cataract   . Hyperlipidemia   . Hypertension    Past Surgical History:  Procedure Laterality Date  . ABDOMINAL HYSTERECTOMY    . APPENDECTOMY    .  BACK SURGERY    . CARPAL TUNNEL RELEASE    . CHOLECYSTECTOMY N/A 06/19/2013   Procedure: LAPAROSCOPIC CHOLECYSTECTOMY;  Surgeon: Harl Bowie, MD;  Location: [facility];  Service: General;  Laterality: N/A;  . ESOPHAGOGASTRODUODENOSCOPY N/A 04/26/2017   Procedure: ESOPHAGOGASTRODUODENOSCOPY (EGD) W/ DILATION;  Surgeon: Ronnette Juniper, MD;  Location: [facility];  Service: Gastroenterology;  Laterality: N/A;  . HERNIA REPAIR    . INSERTION OF MESH  05/19/2018   Procedure: INSERTION OF MESH;  Surgeon: Excell Seltzer, MD;  Location: [facility];  Service: General;;  . IR GENERIC HISTORICAL  03/18/2016   IR RADIOLOGIST EVAL & MGMT 03/18/2016 Corrie Mckusick, DO [facility] INTERV RAD  . KNEE SURGERY    . SHOULDER SURGERY    . SPLENECTOMY, TOTAL    . VENTRAL HERNIA REPAIR N/A 05/19/2018   Procedure: HERNIA REPAIR VENTRAL ADULT;  Surgeon: Excell Seltzer, MD;  Location: [facility];  Service: General;  Laterality: N/A;     A IV Location/Drains/Wounds Patient Lines/Drains/Airways Status   Active Line/Drains/Airways    Name:   Placement date:   Placement time:   Site:   Days:   Peripheral IV 03/09/19 Anterior;Proximal;Right Forearm   03/09/19    1808    Forearm   less than 1   Incision 06/19/13 Abdomen Other (Comment)   06/19/13    1221     2089   Incision (Closed) 05/19/18 Abdomen   05/19/18    1500     294   Incision -  4 Ports Abdomen 1: Umbilicus 2: Mid;Upper 3: Right;Upper 4: Right;Lower   06/19/13    1142     2089   Pressure Injury 05/21/18 Stage II -  Partial thickness loss of dermis presenting as a shallow open ulcer with a red, pink wound bed without slough.   05/21/18    1052     292   Pressure Injury 05/21/18 Stage II -  Partial thickness loss of dermis presenting as a shallow open ulcer with a red, pink wound bed without slough.   05/21/18    1052     292          Intake/Output Last 24 hours No intake or output data in the 24 hours ending 03/09/19 2126  Labs/Imaging Results for orders placed or  performed during the hospital encounter of 03/09/19 (from the past 48 hour(s))  Ethanol     Status: None   Collection Time: 03/09/19  6:20 PM  Result Value Ref Range   Alcohol, Ethyl (B) <10 <10 mg/dL    Comment: (NOTE) Lowest detectable limit for serum alcohol is 10 mg/dL. For medical purposes only. Performed at [facility], [place] [address] [place], [place] [address]   Protime-INR     Status: None   Collection Time: 03/09/19  6:20 PM  Result Value Ref Range   Prothrombin Time 13.8 11.4 - 15.2 seconds   INR 1.1 0.8 - 1.2    Comment: (NOTE) INR goal varies based on device and disease states. Performed at [facility], [place] [address], [place], [place] [address]   APTT     Status: None   Collection Time: 03/09/19  6:20 PM  Result Value Ref Range   aPTT 32 24 - 36 seconds    Comment: Performed at [facility] [address] [place], [place] [address]  CBC     Status: None   Collection Time: 03/09/19  6:20 PM  Result Value Ref Range   WBC 6.7 4.0 - 10.5 K/uL   RBC 4.56 3.87 - 5.11 MIL/uL   Hemoglobin 14.6 12.0 - 15.0 g/dL   HCT 45.2 36.0 - 46.0 %   MCV 99.1 80.0 - 100.0 fL   MCH 32.0 26.0 - 34.0 pg   MCHC 32.3 30.0 - 36.0 g/dL   RDW 14.8 11.5 - 15.5 %   Platelets 226 150 - 400 K/uL   nRBC 0.0 0.0 - 0.2 %    Comment: Performed at [facility], [place] [address], [place], [place] [address]  Differential     Status: None   Collection Time: 03/09/19  6:20 PM  Result Value Ref Range   Neutrophils Relative % 63 %   Neutro Abs 4.3 1.7 - 7.7 K/uL   Lymphocytes Relative 22 %   Lymphs Abs 1.4 0.7 - 4.0 K/uL   Monocytes Relative 13 %   Monocytes Absolute 0.9 0.1 - 1.0 K/uL   Eosinophils Relative 2 %   Eosinophils Absolute 0.1 0.0 - 0.5 K/uL   Basophils Relative 0 %   Basophils Absolute 0.0 0.0 - 0.1 K/uL   Immature Granulocytes 0 %   Abs Immature Granulocytes 0.03 0.00 - 0.07 K/uL    Comment: Performed at [facility], [place] [address],  [place], [place] [address]  Comprehensive metabolic panel     Status: Abnormal   Collection Time: 03/09/19  6:20 PM  Result Value Ref Range   Sodium 140 135 - 145 mmol/L  Potassium 4.4 3.5 - 5.1 mmol/L   Chloride 105 98 - 111 mmol/L   CO2 26 22 - 32 mmol/L   Glucose, Bld 101 (H) 70 - 99 mg/dL   BUN 29 (H) 8 - 23 mg/dL   Creatinine, Ser 1.05 (H) 0.44 - 1.00 mg/dL   Calcium 9.8 8.9 - 10.3 mg/dL   Total Protein 5.9 (L) 6.5 - 8.1 g/dL   Albumin 3.6 3.5 - 5.0 g/dL   AST 29 15 - 41 U/L   ALT 22 0 - 44 U/L   Alkaline Phosphatase 77 38 - 126 U/L   Total Bilirubin 0.5 0.3 - 1.2 mg/dL   GFR calc non Af Amer 47 (L) >60 mL/min   GFR calc Af Amer 55 (L) >60 mL/min   Anion gap 9 5 - 15    Comment: Performed at [facility], [address], [place], [place] [address]  Urine rapid drug screen (hosp performed)     Status: None   Collection Time: 03/09/19  7:43 PM  Result Value Ref Range   Opiates NONE DETECTED NONE DETECTED   Cocaine NONE DETECTED NONE DETECTED   Benzodiazepines NONE DETECTED NONE DETECTED   Amphetamines NONE DETECTED NONE DETECTED   Tetrahydrocannabinol NONE DETECTED NONE DETECTED   Barbiturates NONE DETECTED NONE DETECTED    Comment: (NOTE) DRUG SCREEN FOR MEDICAL PURPOSES ONLY.  IF CONFIRMATION IS NEEDED FOR ANY PURPOSE, NOTIFY LAB WITHIN 5 DAYS. LOWEST DETECTABLE LIMITS FOR URINE DRUG SCREEN Drug Class                     Cutoff (ng/mL) Amphetamine and metabolites    1000 Barbiturate and metabolites    200 Benzodiazepine                 604 Tricyclics and metabolites     300 Opiates and metabolites        300 Cocaine and metabolites        300 THC                            50 Performed at [facility], [place] [address], [place], [place] [address]   Urinalysis, Routine w reflex microscopic     Status: Abnormal   Collection Time: 03/09/19  7:43 PM  Result Value Ref Range   Color, Urine YELLOW YELLOW   APPearance CLEAR CLEAR   Specific Gravity, Urine 1.014  1.005 - 1.030   pH 6.0 5.0 - 8.0   Glucose, UA NEGATIVE NEGATIVE mg/dL   Hgb urine dipstick NEGATIVE NEGATIVE   Bilirubin Urine NEGATIVE NEGATIVE   Ketones, ur NEGATIVE NEGATIVE mg/dL   Protein, ur NEGATIVE NEGATIVE mg/dL   Nitrite POSITIVE (A) NEGATIVE   Leukocytes,Ua SMALL (A) NEGATIVE   WBC, UA 11-20 0 - 5 WBC/hpf   Bacteria, UA RARE (A) NONE SEEN   Squamous Epithelial / LPF 0-5 0 - 5   Mucus PRESENT     Comment: Performed at [facility], [place] [address] [place], [place] [address]   Mr Brain Wo Contrast (neuro Protocol)  Result Date: 03/09/2019 CLINICAL DATA:  Episode of left upper extremity and facial numbness lasting 20 minutes. TIA, initial encounter. EXAM: MRI HEAD WITHOUT CONTRAST TECHNIQUE: Multiplanar, multiecho pulse sequences of the brain and surrounding structures were obtained without intravenous contrast. COMPARISON:  Report of CT head 04/14/2001. Images are no longer available. FINDINGS: Brain: Diffusion-weighted images demonstrate  no acute or subacute infarction. Mild atrophy and white matter changes are within normal limits for age. A remote lacunar infarct is present within the left thalamus. Basal ganglia are normal. The internal auditory canals are within normal limits. The brainstem and cerebellum are within normal limits. Vascular: Flow is present in the major intracranial arteries. Skull and upper cervical spine: Mild degenerative changes are present the upper cervical spine. Craniocervical junction is normal. Midline structures are unremarkable. Sinuses/Orbits: Mild mucosal thickening is present within anterior ethmoid air cells. The paranasal sinuses and mastoid air cells are otherwise clear. Bilateral lens replacements are noted. Globes and orbits are otherwise unremarkable. IMPRESSION: 1. No acute intracranial abnormality. 2. Remote lacunar infarct of the left thalamus. 3. MRI brain is otherwise normal for age. Electronically Signed   By: San Morelle M.D.    On: 03/09/2019 19:31    Pending Labs Unresulted Labs (From admission, onward)    Start     Ordered   03/09/19 2024  Urine culture  ONCE - STAT,   STAT     03/09/19 2023   03/09/19 1753  SARS Coronavirus 2 (CEPHEID - Performed in [facility] lab), Hosp Order  (Asymptomatic Patients Labs)  Once,   STAT    Question:  Rule Out  Answer:  Yes   03/09/19 1752          Vitals/Pain Today's Vitals   03/09/19 1810 03/09/19 1812 03/09/19 1830 03/09/19 2030  BP:   (!) 177/79 (!) 204/71  Pulse:   66 (!) 55  Resp:   (!) 23 16  Temp:      TempSrc:      SpO2:   97% 99%  PainSc: 0-No pain 0-No pain      Isolation Precautions No active isolations  Medications Medications - No data to display  Mobility walks with person assist High fall risk   Focused Assessments Neuro Assessment Handoff:  Swallow screen pass? Yes    NIH Stroke Scale ( + Modified Stroke Scale Criteria)  Interval: Initial Level of Consciousness (1a.)   : Alert, keenly responsive LOC Questions (1b. )   +: Answers both questions correctly LOC Commands (1c. )   + : Performs both tasks correctly Best Gaze (2. )  +: Normal Visual (3. )  +: No visual loss Facial Palsy (4. )    : Normal symmetrical movements Motor Arm, Left (5a. )   +: No drift Motor Arm, Right (5b. )   +: No drift Motor Leg, Left (6a. )   +: No drift Motor Leg, Right (6b. )   +: No drift Limb Ataxia (7. ): Absent Sensory (8. )   +: Normal, no sensory loss Best Language (9. )   +: No aphasia Dysarthria (10. ): Normal Extinction/Inattention (11.)   +: No Abnormality Modified SS Total  +: 0 Complete NIHSS TOTAL: 0 Last date known well: 03/09/19 Last time known well: 1600 Neuro Assessment: Within Defined Limits Neuro Checks:   Initial (03/09/19 1812)  Last Documented NIHSS Modified Score: 0 (03/09/19 1812) Has TPA been given? No If patient is a Neuro Trauma and patient is going to OR before floor call report to [facility] nurse:  [phone] or [phone]     R Recommendations: See Admitting Provider Note  Report given to:   Additional Notes:

## 2019-03-09 NOTE — ED Provider Notes (Signed)
Care assumed from Dr. Melina Copa at end of shift, please see his note for full details, but in brief [name] is a 85 y.o. female who presents for evaluation of 20-minute episode of left-sided arm and facial numbness, symptoms resolved on arrival.  No prior history of stroke or similar symptoms in the past.  Concerning for TIA.  Lab work is overall reassuring, aside from mild signs of UTI with positive nitrites, small amount of leukocytes and rare bacteria.  MRI brain pending.  Will consult neurology, likely admission for TIA work-up.  ED Course/Procedures   Labs Reviewed  COMPREHENSIVE METABOLIC PANEL - Abnormal; Notable for the following components:      Result Value   Glucose, Bld 101 (*)    BUN 29 (*)    Creatinine, Ser 1.05 (*)    Total Protein 5.9 (*)    GFR calc non Af Amer 47 (*)    GFR calc Af Amer 55 (*)    All other components within normal limits  URINALYSIS, ROUTINE W REFLEX MICROSCOPIC - Abnormal; Notable for the following components:   Nitrite POSITIVE (*)    Leukocytes,Ua SMALL (*)    Bacteria, UA RARE (*)    All other components within normal limits  SARS CORONAVIRUS 2 (HOSPITAL ORDER, [facility])  URINE CULTURE  ETHANOL  PROTIME-INR  APTT  CBC  DIFFERENTIAL  RAPID URINE DRUG SCREEN, HOSP PERFORMED    Mr Brain Wo Contrast (neuro Protocol)  Result Date: 03/09/2019 CLINICAL DATA:  Episode of left upper extremity and facial numbness lasting 20 minutes. TIA, initial encounter. EXAM: MRI HEAD WITHOUT CONTRAST TECHNIQUE: Multiplanar, multiecho pulse sequences of the brain and surrounding structures were obtained without intravenous contrast. COMPARISON:  Report of CT head 04/14/2001. Images are no longer available. FINDINGS: Brain: Diffusion-weighted images demonstrate no acute or subacute infarction. Mild atrophy and white matter changes are within normal limits for age. A remote lacunar infarct is present within the left thalamus. Basal  ganglia are normal. The internal auditory canals are within normal limits. The brainstem and cerebellum are within normal limits. Vascular: Flow is present in the major intracranial arteries. Skull and upper cervical spine: Mild degenerative changes are present the upper cervical spine. Craniocervical junction is normal. Midline structures are unremarkable. Sinuses/Orbits: Mild mucosal thickening is present within anterior ethmoid air cells. The paranasal sinuses and mastoid air cells are otherwise clear. Bilateral lens replacements are noted. Globes and orbits are otherwise unremarkable. IMPRESSION: 1. No acute intracranial abnormality. 2. Remote lacunar infarct of the left thalamus. 3. MRI brain is otherwise normal for age. Electronically Signed   By: San Morelle M.D.   On: 03/09/2019 19:31    Procedures  MDM   Case discussed with Dr. Leonel Ramsay with neurology who agrees with plan for admission for TIA work-up and agrees with MRI.  MRI without acute intracranial abnormality, there is evidence of a remote lacunar infarct in the left thalamus.  Urinalysis does show mild signs of infection which could also be contributing to patient's symptoms.  Will send culture, discussed with Dr. Roel Cluck with hospitalist who would like to hold off on treating for UTI until she speaks with the patient.  Consult placed to hospitalist for admission.  Cased discussed with Dr. Roel Cluck who will see and admit the patient, requests troponin and chest x-ray be added onto work-up.  Final diagnoses:  TIA (transient ischemic attack)      Jacqlyn Larsen, PA-C 03/09/19 2101  Hayden Rasmussen, MD 03/10/19 [phone]

## 2019-03-09 NOTE — ED Notes (Signed)
Called son and provided update.

## 2019-03-10 ENCOUNTER — Observation Stay (HOSPITAL_BASED_OUTPATIENT_CLINIC_OR_DEPARTMENT_OTHER): Payer: Medicare Other

## 2019-03-10 ENCOUNTER — Observation Stay (HOSPITAL_COMMUNITY): Payer: Medicare Other

## 2019-03-10 ENCOUNTER — Encounter (HOSPITAL_COMMUNITY): Payer: Medicare Other

## 2019-03-10 DIAGNOSIS — I10 Essential (primary) hypertension: Secondary | ICD-10-CM

## 2019-03-10 DIAGNOSIS — I371 Nonrheumatic pulmonary valve insufficiency: Secondary | ICD-10-CM | POA: Diagnosis not present

## 2019-03-10 DIAGNOSIS — G459 Transient cerebral ischemic attack, unspecified: Secondary | ICD-10-CM | POA: Diagnosis not present

## 2019-03-10 DIAGNOSIS — K449 Diaphragmatic hernia without obstruction or gangrene: Secondary | ICD-10-CM | POA: Diagnosis not present

## 2019-03-10 DIAGNOSIS — I119 Hypertensive heart disease without heart failure: Secondary | ICD-10-CM | POA: Diagnosis not present

## 2019-03-10 DIAGNOSIS — I25119 Atherosclerotic heart disease of native coronary artery with unspecified angina pectoris: Secondary | ICD-10-CM | POA: Diagnosis not present

## 2019-03-10 DIAGNOSIS — Z7902 Long term (current) use of antithrombotics/antiplatelets: Secondary | ICD-10-CM | POA: Diagnosis not present

## 2019-03-10 DIAGNOSIS — D469 Myelodysplastic syndrome, unspecified: Secondary | ICD-10-CM | POA: Diagnosis not present

## 2019-03-10 DIAGNOSIS — I361 Nonrheumatic tricuspid (valve) insufficiency: Secondary | ICD-10-CM

## 2019-03-10 DIAGNOSIS — Z1159 Encounter for screening for other viral diseases: Secondary | ICD-10-CM | POA: Diagnosis not present

## 2019-03-10 DIAGNOSIS — E785 Hyperlipidemia, unspecified: Secondary | ICD-10-CM

## 2019-03-10 DIAGNOSIS — Z79899 Other long term (current) drug therapy: Secondary | ICD-10-CM | POA: Diagnosis not present

## 2019-03-10 DIAGNOSIS — K439 Ventral hernia without obstruction or gangrene: Secondary | ICD-10-CM | POA: Diagnosis not present

## 2019-03-10 DIAGNOSIS — Z888 Allergy status to other drugs, medicaments and biological substances status: Secondary | ICD-10-CM | POA: Diagnosis not present

## 2019-03-10 DIAGNOSIS — R079 Chest pain, unspecified: Secondary | ICD-10-CM

## 2019-03-10 DIAGNOSIS — I2510 Atherosclerotic heart disease of native coronary artery without angina pectoris: Secondary | ICD-10-CM | POA: Diagnosis not present

## 2019-03-10 DIAGNOSIS — Z7982 Long term (current) use of aspirin: Secondary | ICD-10-CM | POA: Diagnosis not present

## 2019-03-10 LAB — LIPID PANEL
Cholesterol: 205 mg/dL — ABNORMAL HIGH (ref 0–200)
HDL: 56 mg/dL (ref >40)
LDL Cholesterol: 138 mg/dL — ABNORMAL HIGH (ref 0–99)
Total CHOL/HDL Ratio: 3.7 RATIO
Triglycerides: 57 mg/dL (ref <150)
VLDL: 11 mg/dL (ref 0–40)

## 2019-03-10 LAB — HEMOGLOBIN A1C
Hgb A1c MFr Bld: 6.2 % — ABNORMAL HIGH (ref 4.8–5.6)
Mean Plasma Glucose: 131.24 mg/dL

## 2019-03-10 LAB — TROPONIN I (HIGH SENSITIVITY)
Troponin I (High Sensitivity): 9 ng/L (ref <18)
Troponin I (High Sensitivity): 8 ng/L (ref <18)

## 2019-03-10 LAB — ECHOCARDIOGRAM COMPLETE
Height: 63 in
Weight: 2268.09 oz

## 2019-03-10 MED ORDER — ASPIRIN EC 81 MG PO TBEC
81.0000 mg | DELAYED_RELEASE_TABLET | Freq: Every morning | ORAL | Status: DC
  Administered 2019-03-10: 81 mg via ORAL
  Filled 2019-03-10: qty 1

## 2019-03-10 MED ORDER — NITROGLYCERIN 0.4 MG SL SUBL
0.4000 mg | SUBLINGUAL_TABLET | SUBLINGUAL | Status: DC | PRN
  Administered 2019-03-10 (×2): 0.4 mg via SUBLINGUAL
  Filled 2019-03-10 (×2): qty 1

## 2019-03-10 MED ORDER — PRAVASTATIN SODIUM 10 MG PO TABS
20.0000 mg | ORAL_TABLET | Freq: Every day | ORAL | Status: DC
  Administered 2019-03-10: 20 mg via ORAL
  Filled 2019-03-10: qty 2

## 2019-03-10 MED ORDER — CLOPIDOGREL BISULFATE 75 MG PO TABS: 75.0000 mg | ORAL_TABLET | Freq: Every day | ORAL | 0 refills | Status: DC

## 2019-03-10 MED ORDER — CLOPIDOGREL BISULFATE 75 MG PO TABS
75.0000 mg | ORAL_TABLET | Freq: Every day | ORAL | Status: DC
  Administered 2019-03-10: 75 mg via ORAL
  Filled 2019-03-10: qty 1

## 2019-03-10 MED ORDER — PRAVASTATIN SODIUM 20 MG PO TABS: 20.0000 mg | ORAL_TABLET | Freq: Every day | ORAL | 0 refills | Status: DC

## 2019-03-10 MED ORDER — AMLODIPINE BESYLATE 2.5 MG PO TABS
2.5000 mg | ORAL_TABLET | Freq: Every morning | ORAL | Status: DC
  Filled 2019-03-10: qty 1

## 2019-03-10 MED ORDER — MORPHINE SULFATE (PF) 2 MG/ML IV SOLN
2.0000 mg | Freq: Once | INTRAVENOUS | Status: AC
  Administered 2019-03-10: 2 mg via INTRAVENOUS
  Filled 2019-03-10: qty 1

## 2019-03-10 MED ORDER — ONDANSETRON HCL 4 MG/2ML IJ SOLN
4.0000 mg | Freq: Four times a day (QID) | INTRAMUSCULAR | Status: DC | PRN
  Administered 2019-03-10: 4 mg via INTRAVENOUS
  Filled 2019-03-10: qty 2

## 2019-03-10 NOTE — Progress Notes (Signed)
Pt c/o 10/10 chest pain radiating to back. NP Bodenheimer paged and made aware. One time dose of morphine and PRN dose of Nitro given. EKG complete by EKG tech. Pt stated relief of chest pain. MD Mikhail made aware of EKG completion. Notified day shift RN in hand off.

## 2019-03-10 NOTE — Progress Notes (Signed)
Patient complaining of left sided chest pain, 9/10. Pain radiating to back. BP 166/85, HR 47 in sinus brady. Dr. Ree Kida paged. Will administer PRN Nitro and continue to monitor.  Hiram Comber, RN 03/10/2019 9:08 AM

## 2019-03-10 NOTE — Progress Notes (Signed)
  Echocardiogram 2D Echocardiogram has been performed.  [name] 03/10/2019, 8:52 AM

## 2019-03-10 NOTE — Discharge Planning (Signed)
Nsg Discharge Note  Admit Date:  03/09/2019 Discharge date: 03/10/2019   [name] to be D/C'd Home per MD order.  AVS completed.  Marland Kitchen  Discharge Medication: Allergies as of 03/10/2019      Reactions   Felodipine Anaphylaxis, Other (See Comments)   Reaction: unknown possible nausea or rash   Prednisone Other (See Comments)   Unknown reaction   Shrimp [shellfish Allergy] Nausea And Vomiting   Statins Other (See Comments)   Muscle/leg pain.   Omeprazole Other (See Comments)   Reaction: unknown possible nausea or rash      Medication List    STOP taking these medications   feeding supplement (ENSURE ENLIVE) Liqd   hydrALAZINE 10 MG tablet Commonly known as: APRESOLINE   nutrition supplement (JUVEN) Pack   traMADol 50 MG tablet Commonly known as: ULTRAM     TAKE these medications   amLODipine 2.5 MG tablet Commonly known as: NORVASC Take 2.5 mg by mouth every morning.   aspirin EC 81 MG tablet Take 81 mg by mouth every morning.   clopidogrel 75 MG tablet Commonly known as: PLAVIX Take 1 tablet (75 mg total) by mouth daily. Start taking on: March 11, 2019   FISH OIL PO Take 1 capsule by mouth every morning.   pravastatin 20 MG tablet Commonly known as: PRAVACHOL Take 1 tablet (20 mg total) by mouth daily at 6 PM.   PRESERVISION AREDS PO Take 1 capsule by mouth every morning.   SYSTANE OP Place 1 drop into both eyes 2 (two) times daily.   traZODone 50 MG tablet Commonly known as: DESYREL Take 25 mg by mouth at bedtime as needed for sleep.   Tylenol 8 Hour Arthritis Pain 650 MG CR tablet Generic drug: acetaminophen Take 650 mg by mouth 2 (two) times a day.       Discharge Assessment: Vitals:   03/10/19 1441 03/10/19 1648  BP: (!) 190/65 (!) 159/68  Pulse: (!) 51 (!) 53  Resp: 18   Temp: 98.4 F (36.9 C)   SpO2: 97%    Skin clean, dry and intact without evidence of skin break down, no evidence of skin tears noted. IV catheter discontinued  intact. Site without signs and symptoms of complications - no redness or edema noted at insertion site, patient denies c/o pain - only slight tenderness at site.  Dressing with slight pressure applied.  D/c Instructions-Education: Discharge instructions given to patient/family with verbalized understanding. D/c education completed with patient/family including follow up instructions, medication list, d/c activities limitations if indicated, with other d/c instructions as indicated by MD - patient able to verbalize understanding, all questions fully answered. Patient instructed to return to ED, call 911, or call MD for any changes in condition.  Patient escorted via [place], and D/C home via private auto.  Hiram Comber, RN 03/10/2019 5:22 PM

## 2019-03-10 NOTE — Discharge Summary (Signed)
Physician Discharge Summary  [name]:[id number] DOB: Nov 09, 1928 DOA: 03/09/2019  PCP: Lajean Manes, MD  Admit date: 03/09/2019 Discharge date: 03/10/2019  Time spent: 45 minutes  Recommendations for Outpatient Follow-up:  Patient will be discharged to home with home health physical and occupational therapy.  Patient will need to follow up with primary care provider within one week of discharge.  Follow up Patient should continue medications as prescribed.  Patient should follow a heart healthy diet.   Discharge Diagnoses:  Active Problems:   Hypertension   CAD (coronary artery disease)   Chest pain   Hyperlipidemia   TIA (transient ischemic attack)   Discharge Condition: stable  Diet recommendation: heart healthy  Filed Weights   03/09/19 2300  Weight: 64.3 kg    History of present illness:  On 03/09/2019 by Dr. [name] [name] is a 85 y.o. female with medical history significant of coronary artery disease, hypertension and hyperlipidemia,  severe hiatal hernia as well as ventral abdominal wall hernia. 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 -MRA head shows no emergent finding or flow-limiting stenosis -carotid doppler: Right and left carotid were near to normal with only minimal wall thickening or plaque.   Bilateral vertebral arteries demonstrate antegrade flow.  Subclavian's with normal flow. -lower extremity doppler: negative for DVT -Echocardiogram: EF 52-84%, LV diastolic doppler parameters are consistent with impaired relaxation. Cannot exclude tiny PFO with L to R shunt at atrial level -hemoglobin A1c 6.2, LDL 138 -Neurology consulted and appreciated, recommended Aspirin + Plavix for 3 weeks than either alone thereafter. Follow up in the office.   Essential hypertension -Continue amlodipine  CAD/Chest pain -pain reproducible with palpation, likely muscloskeletal -High-sensitivity troponin unremarkable -EKG obtained and reviewed showing sinus bradycardia with first-degree AV block, otherwise unremarkable -Echocardiogram as above  Hyperlipidemia -Lipid panel: TC 205, HDL 56, LDL 138, TG 57 -placed on low dose statin -continue omega3  History of myelodysplasia -Stable, follow up with oncology  Procedures: Echocardiogram Carotid doppler  Consultations: Neurology  Discharge Exam: Vitals:   03/10/19 1441 03/10/19 1648  BP: (!) 190/65 (!) 159/68  Pulse: (!) 51 (!) 53  Resp: 18   Temp: 98.4 F (36.9 C)   SpO2: 97%      General: Well developed, well nourished, NAD, appears stated age  HEENT: NCAT, PERRLA, EOMI, Anicteic Sclera, mucous membranes moist.  Cardiovascular: S1 S2 auscultated, RRR  Chest wall: TTP of left chest  Respiratory: Clear to auscultation bilaterally with equal chest rise  Abdomen: Soft, nontender, nondistended, + bowel sounds  Extremities: warm dry without cyanosis clubbing or edema  Neuro: AAOx3, hard of hearing, otherwise nonfocal  Psych: Normal affect and demeanor   Discharge Instructions Discharge Instructions    Ambulatory referral to Neurology   Complete by: As directed    Follow up with stroke clinic NP (Jessica Vanschaick or Cecille Rubin, if both not available, consider Zachery Dauer,  or Ahern) at [facility] in about 4 weeks. Thanks.     Discharge instructions   Complete by: As directed    Patient will be discharged to home with home health physical and occupational therapy.  Patient will need to follow up with primary care provider within one week of discharge.  Follow up Patient should continue medications as prescribed.  Patient should follow a heart healthy diet.     Allergies as of 03/10/2019      Reactions   Felodipine Anaphylaxis, Other (See Comments)   Reaction: unknown possible nausea or rash   Prednisone Other (See Comments)   Unknown reaction   Shrimp [shellfish Allergy] Nausea And Vomiting   Statins Other (See Comments)   Muscle/leg pain.   Omeprazole Other (See Comments)   Reaction: unknown possible nausea or rash      Medication List    STOP taking these medications   feeding supplement (ENSURE ENLIVE) Liqd   hydrALAZINE 10 MG tablet Commonly known as: APRESOLINE   nutrition supplement (JUVEN) Pack   traMADol 50 MG tablet Commonly known as: ULTRAM     TAKE these medications   amLODipine 2.5 MG tablet Commonly known as: NORVASC Take 2.5 mg by mouth every morning.   aspirin EC 81 MG tablet Take 81 mg by mouth every morning.   clopidogrel 75 MG tablet Commonly known as: PLAVIX Take 1 tablet (75 mg total) by mouth daily. Start taking on: March 11, 2019   FISH OIL PO Take 1 capsule by mouth every morning.   pravastatin 20 MG tablet Commonly known as: PRAVACHOL Take 1 tablet (20 mg total) by mouth daily at 6 PM.   PRESERVISION AREDS PO Take 1 capsule by mouth every morning.   SYSTANE OP Place 1 drop into both eyes 2 (two) times daily.   traZODone 50 MG tablet Commonly known as: DESYREL Take 25 mg by mouth at bedtime as needed for sleep.   Tylenol 8 Hour Arthritis Pain 650 MG CR tablet Generic drug: acetaminophen Take 650 mg by mouth 2 (two) times a Summer.      Allergies  Allergen Reactions   Felodipine Anaphylaxis and Other (See Comments)    Reaction: unknown possible  nausea or rash   Prednisone Other (See Comments)    Unknown reaction   Shrimp [Shellfish Allergy] Nausea And Vomiting   Statins Other (See Comments)    Muscle/leg pain.   Omeprazole Other (See Comments)    Reaction: unknown possible nausea or rash   Follow-up Information    Stoneking, Hal, MD. Schedule an appointment as soon as possible for a visit in 1 week(s).   Specialty: Internal Medicine Why: Hospital follow up- discuss cardiology referral Contact information: [address] [place] [address] [phone]        [facility]. Schedule an appointment as soon as possible for a visit in 4 week(s).   Specialty: Neurology Contact information: [address] [place] [phone]           The results of significant diagnostics from this hospitalization (including imaging, microbiology, ancillary and laboratory) are listed below for reference.    Significant Diagnostic Studies: Mr Angio Head Wo Contrast  Result Date: 03/10/2019 CLINICAL DATA:  TIA EXAM: MRA HEAD WITHOUT CONTRAST TECHNIQUE: Angiographic images of the Circle of Willis were obtained using MRA technique without intravenous contrast. COMPARISON:  None. FINDINGS: Fetal type PCA on the right. Small fenestration at the vertebrobasilar junction. Otherwise  unremarkable intracranial anatomy.No branch occlusion, beading, aneurysm, or flow limiting stenosis. IMPRESSION: No emergent finding or flow limiting stenosis. Electronically Signed   By: Monte Fantasia M.D.   On: 03/10/2019 10:59   Mr Brain Wo Contrast (neuro Protocol)  Result Date: 03/09/2019 CLINICAL DATA:  Episode of left upper extremity and facial numbness lasting 20 minutes. TIA, initial encounter. EXAM: MRI HEAD WITHOUT CONTRAST TECHNIQUE: Multiplanar, multiecho pulse sequences of the brain and surrounding structures were obtained without intravenous contrast. COMPARISON:  Report of CT  head 04/14/2001. Images are no longer available. FINDINGS: Brain: Diffusion-weighted images demonstrate no acute or subacute infarction. Mild atrophy and white matter changes are within normal limits for age. A remote lacunar infarct is present within the left thalamus. Basal ganglia are normal. The internal auditory canals are within normal limits. The brainstem and cerebellum are within normal limits. Vascular: Flow is present in the major intracranial arteries. Skull and upper cervical spine: Mild degenerative changes are present the upper cervical spine. Craniocervical junction is normal. Midline structures are unremarkable. Sinuses/Orbits: Mild mucosal thickening is present within anterior ethmoid air cells. The paranasal sinuses and mastoid air cells are otherwise clear. Bilateral lens replacements are noted. Globes and orbits are otherwise unremarkable. IMPRESSION: 1. No acute intracranial abnormality. 2. Remote lacunar infarct of the left thalamus. 3. MRI brain is otherwise normal for age. Electronically Signed   By: San Morelle M.D.   On: 03/09/2019 19:31   Dg Chest Port 1 View  Result Date: 03/09/2019 CLINICAL DATA:  TIA workup EXAM: PORTABLE CHEST 1 VIEW COMPARISON:  05/18/2018 FINDINGS: Cardiomegaly. Hiatal hernia. Both lungs are clear. The visualized skeletal structures are unremarkable. IMPRESSION: Cardiomegaly without acute abnormality of the lungs.  Hiatal hernia. Electronically Signed   By: Eddie Candle M.D.   On: 03/09/2019 21:25   Vas US Carotid (at [facility] Only)  Result Date: 03/10/2019 Carotid Arterial Duplex Study Indications:       TIA, Numbness and Weakness. Risk Factors:      Hypertension, hyperlipidemia, coronary artery disease. Comparison Study:  No prior study on file for comparison. Performing Technologist: Sharion Dove RVS  Examination Guidelines: A complete evaluation includes B-mode imaging, spectral Doppler, color Doppler, and power Doppler as needed of all  accessible portions of each vessel. Bilateral testing is considered an integral part of a complete examination. Limited examinations for reoccurring indications may be performed as noted.  Right Carotid Findings: +----------+--------+--------+--------+--------+------------------+             PSV cm/s EDV cm/s Stenosis Describe Comments            +----------+--------+--------+--------+--------+------------------+  CCA Prox   139      17                         intimal thickening  +----------+--------+--------+--------+--------+------------------+  CCA Distal 117      25                         intimal thickening  +----------+--------+--------+--------+--------+------------------+  ICA Prox   119      21                                             +----------+--------+--------+--------+--------+------------------+  ICA Distal 66       22                                             +----------+--------+--------+--------+--------+------------------+  ECA        133      0                                              +----------+--------+--------+--------+--------+------------------+ +----------+--------+-------+--------+-------------------+             PSV cm/s EDV cms Describe Arm Pressure (mmHG)  +----------+--------+-------+--------+-------------------+  Subclavian 174                                            +----------+--------+-------+--------+-------------------+ +---------+--------+--+--------+--+  Vertebral PSV cm/s 61 EDV cm/s 20  +---------+--------+--+--------+--+  Left Carotid Findings: +----------+--------+--------+--------+--------+------------------+             PSV cm/s EDV cm/s Stenosis Describe Comments            +----------+--------+--------+--------+--------+------------------+  CCA Prox   133      21                         intimal thickening  +----------+--------+--------+--------+--------+------------------+  CCA Distal 115      23                         intimal thickening   +----------+--------+--------+--------+--------+------------------+  ICA Prox   121      28                                             +----------+--------+--------+--------+--------+------------------+  ICA Distal 97       28                                             +----------+--------+--------+--------+--------+------------------+  ECA        215      8                                              +----------+--------+--------+--------+--------+------------------+ +----------+--------+--------+--------+-------------------+  Subclavian PSV cm/s EDV cm/s Describe Arm Pressure (mmHG)  +----------+--------+--------+--------+-------------------+             149                                             +----------+--------+--------+--------+-------------------+ +---------+--------+--+--------+-+  Vertebral PSV cm/s 42 EDV cm/s 6  +---------+--------+--+--------+-+  Summary: Right Carotid: The extracranial vessels were near-normal with only minimal wall                thickening or plaque. Left Carotid: The extracranial vessels were near-normal with only minimal wall               thickening or plaque. Vertebrals:  Bilateral vertebral arteries demonstrate antegrade flow. Subclavians: Normal flow hemodynamics were seen in bilateral subclavian  arteries. *See table(s) above for measurements and observations.     Preliminary    Vas [place] Lower Extremity Venous (dvt)  Result Date: 03/10/2019  Lower Venous Study Indications: TIA, possible PFO.  Performing Technologist: Maudry Mayhew MHA, RDMS, RVT, RDCS  Examination Guidelines: A complete evaluation includes B-mode imaging, spectral Doppler, color Doppler, and power Doppler as needed of all accessible portions of each vessel. Bilateral testing is considered an integral part of a complete examination. Limited examinations for reoccurring indications may be performed as noted.  +---------+---------------+---------+-----------+----------+-------+  RIGHT      Compressibility Phasicity Spontaneity Properties Summary  +---------+---------------+---------+-----------+----------+-------+  CFV       Full            Yes       Yes                             +---------+---------------+---------+-----------+----------+-------+  SFJ       Full                                                      +---------+---------------+---------+-----------+----------+-------+  FV Prox   Full                                                      +---------+---------------+---------+-----------+----------+-------+  FV Mid    Full                                                      +---------+---------------+---------+-----------+----------+-------+  FV Distal Full                                                      +---------+---------------+---------+-----------+----------+-------+  PFV       Full                                                      +---------+---------------+---------+-----------+----------+-------+  POP       Full            Yes       Yes                             +---------+---------------+---------+-----------+----------+-------+  PTV       Full                                                      +---------+---------------+---------+-----------+----------+-------+  PERO      Full                                                      +---------+---------------+---------+-----------+----------+-------+   +---------+---------------+---------+-----------+----------+-------+  LEFT      Compressibility Phasicity Spontaneity Properties Summary  +---------+---------------+---------+-----------+----------+-------+  CFV       Full            Yes       Yes                             +---------+---------------+---------+-----------+----------+-------+  SFJ       Full                                                      +---------+---------------+---------+-----------+----------+-------+  FV Prox   Full                                                       +---------+---------------+---------+-----------+----------+-------+  FV Mid    Full                                                      +---------+---------------+---------+-----------+----------+-------+  FV Distal Full                                                      +---------+---------------+---------+-----------+----------+-------+  PFV       Full                                                      +---------+---------------+---------+-----------+----------+-------+  POP       Full            Yes       Yes                             +---------+---------------+---------+-----------+----------+-------+  PTV       Full                                                      +---------+---------------+---------+-----------+----------+-------+  PERO      Full                                                      +---------+---------------+---------+-----------+----------+-------+     Summary: Right: There is no evidence of deep vein thrombosis in the lower extremity. A cystic structure is found in the popliteal fossa. Left: There is no evidence of deep vein thrombosis in the lower extremity. No cystic  structure found in the popliteal fossa.  *See table(s) above for measurements and observations. Electronically signed by Deitra Mayo MD on 03/10/2019 at 4:14:26 PM.    Final     Microbiology: Recent Results (from the past 240 hour(s))  Urine culture     Status: Abnormal (Preliminary result)   Collection Time: 03/09/19  7:56 PM   Specimen: Urine, Random  Result Value Ref Range Status   Specimen Description URINE, RANDOM  Final   Special Requests   Final    NONE Performed at [facility], [address], [place], [place] [address]    Culture >=100,000 COLONIES/mL GRAM NEGATIVE RODS (A)  Final   Report Status PENDING  Incomplete  SARS Coronavirus 2 (CEPHEID - Performed in [place] hospital lab), Hosp Order     Status: None   Collection Time: 03/09/19  8:21 PM   Specimen:  Nasopharyngeal Swab  Result Value Ref Range Status   SARS Coronavirus 2 NEGATIVE NEGATIVE Final    Comment: (NOTE) If result is NEGATIVE SARS-CoV-2 target nucleic acids are NOT DETECTED. The SARS-CoV-2 RNA is generally detectable in upper and lower  respiratory specimens during the acute phase of infection. The lowest  concentration of SARS-CoV-2 viral copies this assay can detect is 250  copies / mL. A negative result does not preclude SARS-CoV-2 infection  and should not be used as the sole basis for treatment or other  patient management decisions.  A negative result may occur with  improper specimen collection / handling, submission of specimen other  than nasopharyngeal swab, presence of viral mutation(s) within the  areas targeted by this assay, and inadequate number of viral copies  (<250 copies / mL). A negative result must be combined with clinical  observations, patient history, and epidemiological information. If result is POSITIVE SARS-CoV-2 target nucleic acids are DETECTED. The SARS-CoV-2 RNA is generally detectable in upper and lower  respiratory specimens dur ing the acute phase of infection.  Positive  results are indicative of active infection with SARS-CoV-2.  Clinical  correlation with patient history and other diagnostic information is  necessary to determine patient infection status.  Positive results do  not rule out bacterial infection or co-infection with other viruses. If result is PRESUMPTIVE POSTIVE SARS-CoV-2 nucleic acids MAY BE PRESENT.   A presumptive positive result was obtained on the submitted specimen  and confirmed on repeat testing.  While 2019 novel coronavirus  (SARS-CoV-2) nucleic acids may be present in the submitted sample  additional confirmatory testing may be necessary for epidemiological  and / or clinical management purposes  to differentiate between  SARS-CoV-2 and other Sarbecovirus currently known to infect humans.  If clinically  indicated additional testing with an alternate test  methodology [phone]) is advised. The SARS-CoV-2 RNA is generally  detectable in upper and lower respiratory sp ecimens during the acute  phase of infection. The expected result is Negative. Fact Sheet for Patients:  StrictlyIdeas.no Fact Sheet for Healthcare Providers: BankingDealers.co.za This test is not yet approved or cleared by the [place] FDA and has been authorized for detection and/or diagnosis of SARS-CoV-2 by FDA under an Emergency Use Authorization (EUA).  This EUA will remain in effect (meaning this test can be used) for the duration of the COVID-19 declaration under Section 564(b)(1) of the Act, 21 U.S.C. section 360bbb-3(b)(1), unless the authorization is terminated or revoked sooner. Performed at [facility], [place] [address] [place], [place] [address]      Labs:  Basic Metabolic Panel: Recent Labs  Lab 03/09/19 1820  NA 140  K 4.4  CL 105  CO2 26  GLUCOSE 101*  BUN 29*  CREATININE 1.05*  CALCIUM 9.8   Liver Function Tests: Recent Labs  Lab 03/09/19 1820  AST 29  ALT 22  ALKPHOS 77  BILITOT 0.5  PROT 5.9*  ALBUMIN 3.6   No results for input(s): LIPASE, AMYLASE in the last 168 hours. No results for input(s): AMMONIA in the last 168 hours. CBC: Recent Labs  Lab 03/09/19 1820  WBC 6.7  NEUTROABS 4.3  HGB 14.6  HCT 45.2  MCV 99.1  PLT 226   Cardiac Enzymes: No results for input(s): CKTOTAL, CKMB, CKMBINDEX, TROPONINI in the last 168 hours. BNP: BNP (last 3 results) No results for input(s): BNP in the last 8760 hours.  ProBNP (last 3 results) No results for input(s): PROBNP in the last 8760 hours.  CBG: No results for input(s): GLUCAP in the last 168 hours.     Signed:  Cristal Ford  [facility] 03/10/2019, 5:06 PM

## 2019-03-10 NOTE — Evaluation (Signed)
Physical Therapy Evaluation Patient Details Name: [name] MRN: [id number] DOB: 1929-01-20 Today's Date: 03/10/2019   History of Present Illness  Pt is a 85 yr old female who presneted to the ED as  with left arm numbness and left facial numbness. Pt has been having also chest pain. Waiting for further reports but cleared by nurse to tx patient. PMH: back sx, carpal tunnel, hernia repair, shoulder sx, knee sx  Clinical Impression  Pt is close to baseline functioning and should be safe at home in her independent environment. There are no further acute PT needs, but pt could benefit from HHPT to recondition if pt agrees.  Will sign off at this time.     Follow Up Recommendations Home health PT(pt may feel she doesn't need f/u)    Equipment Recommendations  None recommended by PT    Recommendations for Other Services       Precautions / Restrictions Precautions Precautions: None Precaution Comments: monitor BP Restrictions Weight Bearing Restrictions: No      Mobility  Bed Mobility Overal bed mobility: Modified Independent Bed Mobility: Rolling Rolling: Modified independent (Device/Increase time)            Transfers Overall transfer level: Needs assistance Equipment used: Rolling walker (2 wheeled) Transfers: Sit to/from Stand Sit to Stand: Supervision         General transfer comment: safe technique and no need for assist  Ambulation/Gait Ambulation/Gait assistance: Modified independent (Device/Increase time);Supervision Gait Distance (Feet): 380 Feet Assistive device: Rolling walker (2 wheeled) Gait Pattern/deviations: Step-through pattern   Gait velocity interpretation: 1.31 - 2.62 ft/sec, indicative of limited community ambulator General Gait Details: steady with safe use of the RW.  Pt able to converse, scan and turn without deviation.  Stairs            Wheelchair Mobility    Modified Rankin (Stroke Patients Only)       Balance  Overall balance assessment: Needs assistance   Sitting balance-Leahy Scale: Good       Standing balance-Leahy Scale: Fair Standing balance comment: reliant on the RW for gait                             Pertinent Vitals/Pain Pain Assessment: Faces Faces Pain Scale: Hurts even more Pain Location: headache Pain Descriptors / Indicators: Constant;Aching Pain Intervention(s): Monitored during session    Home Living Family/patient expects to be discharged to:: Private residence Living Arrangements: Alone Available Help at Discharge: Family;Available PRN/intermittently Type of Home: Independent living facility Home Access: Level entry     Home Layout: One level Home Equipment: Walker - 2 wheels Additional Comments: from [place] at [employer], prepares one meal    Prior Function Level of Independence: Independent with assistive device(s)         Comments: walks with walker, drives to church and to grocery store, takes noon meal at dining facility at [place]: Right    Extremity/Trunk Assessment   Upper Extremity Assessment Upper Extremity Assessment: Overall WFL for tasks assessed    Lower Extremity Assessment Lower Extremity Assessment: Overall WFL for tasks assessed       Communication   Communication: No difficulties  Cognition Arousal/Alertness: Awake/alert Behavior During Therapy: WFL for tasks assessed/performed Overall Cognitive Status: Within Functional Limits for tasks assessed  General Comments General comments (skin integrity, edema, etc.): BP 190/79, pt asymptomatic    Exercises     Assessment/Plan    PT Assessment All further PT needs can be met in the next venue of care  PT Problem List Decreased mobility       PT Treatment Interventions      PT Goals (Current goals can be found in the Care Plan section)  Acute Rehab PT Goals Patient  Stated Goal: to feel better PT Goal Formulation: All assessment and education complete, DC therapy    Frequency     Barriers to discharge        Co-evaluation               AM-PAC PT "6 Clicks" Mobility  Outcome Measure Help needed turning from your back to your side while in a flat bed without using bedrails?: None Help needed moving from lying on your back to sitting on the side of a flat bed without using bedrails?: None Help needed moving to and from a bed to a chair (including a wheelchair)?: None Help needed standing up from a chair using your arms (e.g., wheelchair or bedside chair)?: None Help needed to walk in hospital room?: None Help needed climbing 3-5 steps with a railing? : A Little 6 Click Score: 23    End of Session   Activity Tolerance: Patient tolerated treatment well Patient left: in bed;with call bell/phone within reach Nurse Communication: Mobility status PT Visit Diagnosis: Difficulty in walking, not elsewhere classified (R26.2);Pain Pain - part of body: (headache)    Time: 1430-1505 PT Time Calculation (min) (ACUTE ONLY): 35 min   Charges:   PT Evaluation $PT Eval Low Complexity: 1 Low PT Treatments $Gait Training: 8-22 mins        03/10/2019  Donnella Sham, PT Acute Rehabilitation Services [phone]  (pager) [phone]  (office)  [name] 03/10/2019, 3:13 PM

## 2019-03-10 NOTE — Progress Notes (Signed)
Pt c/o nausea. NP Bodenheimer paged and made aware. Awaiting orders.

## 2019-03-10 NOTE — Progress Notes (Signed)
9:15 7/15 per RN pt is having chest pains and unable to travel to MRI and complete exam. RN will CB when pt conditions changes/improves

## 2019-03-10 NOTE — Progress Notes (Signed)
STROKE TEAM PROGRESS NOTE   HISTORY OF PRESENT ILLNESS (per record) [name] is a 85 y.o. female with a history of hypertension, hyperlipidemia, coronary artery disease who had a transient episode of left arm and face numbness which occurred at 4 PM.  She states it lasted for approximately 20 minutes.  She did not notice any weakness, visual change, vertigo, nausea, or other symptoms.   LKW: 4 PM tpa given?: no, resolved symptoms   SUBJECTIVE (INTERVAL HISTORY) Her RN is at the bedside.  Pt stated that she had 20 min episode yesterday of left lip numbness and left arm numbness. No other associated symptoms. No HA, never happened before. Now all resolved, at baseline.  OBJECTIVE Vitals:   03/10/19 0309 03/10/19 0505 03/10/19 0851 03/10/19 0906  BP: (!) 157/62 (!) 150/69 (!) 153/41 (!) 166/85  Pulse: (!) 53 (!) 54 (!) 41 (!) 43  Resp: 20 18 18    Temp: 97.8 F (36.6 C) 98.2 F (36.8 C) 98 F (36.7 C) 98.3 F (36.8 C)  TempSrc:  Oral Oral Oral  SpO2: 94% 94% 95% 96%  Weight:      Height:        CBC:  Recent Labs  Lab 03/09/19 1820  WBC 6.7  NEUTROABS 4.3  HGB 14.6  HCT 45.2  MCV 99.1  PLT 737    Basic Metabolic Panel:  Recent Labs  Lab 03/09/19 1820  NA 140  K 4.4  CL 105  CO2 26  GLUCOSE 101*  BUN 29*  CREATININE 1.05*  CALCIUM 9.8    Lipid Panel:     Component Value Date/Time   CHOL 205 (H) 03/10/2019 0325   TRIG 57 03/10/2019 0325   HDL 56 03/10/2019 0325   CHOLHDL 3.7 03/10/2019 0325   VLDL 11 03/10/2019 0325   LDLCALC 138 (H) 03/10/2019 0325   HgbA1c:  Lab Results  Component Value Date   HGBA1C 6.2 (H) 03/10/2019   Urine Drug Screen:     Component Value Date/Time   LABOPIA NONE DETECTED 03/09/2019 1943   COCAINSCRNUR NONE DETECTED 03/09/2019 1943   LABBENZ NONE DETECTED 03/09/2019 1943   AMPHETMU NONE DETECTED 03/09/2019 1943   THCU NONE DETECTED 03/09/2019 1943   LABBARB NONE DETECTED 03/09/2019 1943    Alcohol Level      Component Value Date/Time   ETH <10 03/09/2019 1820    IMAGING  Mr Brain Wo Contrast (neuro Protocol) 03/09/2019 IMPRESSION:  1. No acute intracranial abnormality.  2. Remote lacunar infarct of the left thalamus. 3. MRI brain is otherwise normal for age.   Dg Chest Port 1 View 03/09/2019 IMPRESSION:  Cardiomegaly without acute abnormality of the lungs.  Hiatal hernia.  Transthoracic Echocardiogram   1. The left ventricle has normal systolic function with an ejection fraction of 60-65%. The cavity size was normal. There is mildly increased left ventricular wall thickness. Left ventricular diastolic Doppler parameters are consistent with impaired  relaxation. Elevated left ventricular end-diastolic pressure The E/e' is 21.6.  2. The right ventricle has normal systolic function. The cavity was normal. There is no increase in right ventricular wall thickness.  3. Left atrial size was moderately dilated.  4. Right atrial size was moderately dilated.  5. The mitral valve is abnormal. There is moderate mitral annular calcification present.  6. The aortic valve is tricuspid. Mild calcification of the aortic valve. Aortic valve regurgitation is trivial by color flow Doppler. No stenosis of the aortic valve.  7. Cannot exclude tiny  PFO with left to right shunt at atrial level.  8. No intracardiac thrombi or masses were visualized.  Bilateral Carotid Dopplers  Right Carotid: The extracranial vessels were near-normal with only minimal wall                thickening or plaque.  Left Carotid: The extracranial vessels were near-normal with only minimal wall               thickening or plaque.  Vertebrals:  Bilateral vertebral arteries demonstrate antegrade flow. Subclavians: Normal flow hemodynamics were seen in bilateral subclavian              arteries.   EKG  - SB rate 51 BPM. (See cardiology reading for complete details)    PHYSICAL EXAM  Temp:  [97.8 F (36.6 C)-98.5 F (36.9  C)] 98.5 F (36.9 C) (07/25 1145) Pulse Rate:  [41-66] 48 (07/25 1252) Resp:  [14-23] 18 (07/25 1145) BP: (150-204)/(41-98) 180/65 (07/25 1252) SpO2:  [94 %-100 %] 100 % (07/25 1145) Weight:  [64.3 kg] 64.3 kg (07/24 2300)  General - Well nourished, well developed, in no apparent distress.  Ophthalmologic - fundi not visualized due to noncooperation.  Cardiovascular - Regular rate and rhythm.  Mental Status -  Level of arousal and orientation to time, place, and person were intact. Language including expression, naming, repetition, comprehension was assessed and found intact. Fund of Knowledge was assessed and was intact.  Cranial Nerves II - XII - II - Visual field intact OU. III, IV, VI - Extraocular movements intact. V - Facial sensation intact bilaterally. VII - Facial movement intact bilaterally. VIII - Hearing & vestibular intact bilaterally. X - Palate elevates symmetrically. XI - Chin turning & shoulder shrug intact bilaterally. XII - Tongue protrusion intact.  Motor Strength - The patient's strength was normal in all extremities and pronator drift was absent.  Bulk was normal and fasciculations were absent.   Motor Tone - Muscle tone was assessed at the neck and appendages and was normal.  Reflexes - The patient's reflexes were symmetrical in all extremities and she had no pathological reflexes.  Sensory - Light touch, temperature/pinprick were assessed and were symmetrical.    Coordination - The patient had normal movements in the hands with no ataxia or dysmetria.  Tremor was absent.  Gait and Station - deferred.   ASSESSMENT/PLAN [name] is a 85 y.o. female with history of hypertension, hyperlipidemia, and coronary artery disease who had a transient episode of left arm and lip numbness.  She did not receive IV t-PA due to resolution of deficits.   possible TIA, however, migraine equivalent is still in DDX    CT head  - not ordered  MRI head - no  acute findings.  MRA head unremarkable  Carotid Doppler unremarkable   2D Echo EF 60-65%, not able to exclude tiny PFO  LE venous doppler pending  Lacey Jensen Virus 2 - negative  LDL - 138  HgbA1c - 6.2  UDS - negative  VTE prophylaxis - SCDs  Diet - Heart healthy with thin liquids.  aspirin 81 mg daily prior to admission, now on aspirin 81 mg daily and Plavix 75 mg daily. Continue DAPT for 3 weeks, and then either ASA or plavix alone.  Patient counseled to be compliant with her antithrombotic medications  Ongoing aggressive stroke risk factor management  Therapy recommendations:  pending  Disposition:  Pending  Hypertension  Stable . Permissive hypertension (OK if <  220/120) but gradually normalize in 3-5 days . Long-term BP goal normotensive  Hyperlipidemia  Lipid lowering medication PTA: none  LDL 138, goal < 70  Current lipid lowering medication: Pravachol 20 mg daily  History of statin intolerance  Other Stroke Risk Factors  Advanced age  Hx stroke/TIA - Remote lacunar infarct of the left thalamus by MRI  Family hx stroke (father)  Coronary artery disease  Other Active Problems  Bradycardia (not on a BB)  Creatinine - 1.05  Hospital day # 0  Neurology will sign off. Please call with questions. Pt will follow up with stroke clinic NP at [facility] in about 4 weeks. Thanks for the consult.  Rosalin Hawking, MD PhD Stroke Neurology 03/10/2019 2:30 PM    To contact Stroke Continuity provider, please refer to [email or web address] After hours, contact General Neurology

## 2019-03-10 NOTE — Progress Notes (Signed)
VASCULAR LAB PRELIMINARY  PRELIMINARY  PRELIMINARY  PRELIMINARY  Carotid duplex completed.    Preliminary report:  See CV proc for preliminary results.   KANADY, CANDACE, RVT 03/10/2019, 1:13 PM

## 2019-03-10 NOTE — Progress Notes (Signed)
Bilateral lower extremity venous duplex completed. Refer to "CV Proc" under chart review to view preliminary results.  03/10/2019 3:26 PM Maudry Mayhew, MHA, RVT, RDCS, RDMS

## 2019-03-10 NOTE — Evaluation (Signed)
Occupational Therapy Evaluation Patient Details Name: [name] MRN: [id number] DOB: 21-Oct-1928 Today's Date: 03/10/2019    History of Present Illness Pt is a 85 yr old female who presneted to the ED as  with left arm numbness and left facial numbness. Pt has been having also chest pain. Waiting for further reports but cleared by nurse to tx patient. PMH: back sx, carpal tunnel, hernia repair, shoulder sx, knee sx   Clinical Impression   Pt admitted with left arm numbness and left facial numbness. Pt's vitals limited session (see bellow) and nurse was made aware. Pt was able to complete rolling side to side with supervision and use of grab bars.  Pt currently with functional limitations due to the deficits listed below (see OT Problem List).  Pt will benefit from skilled OT to increase their safety and independence with ADL and functional mobility for ADL and to update dc plans as needed.   Supine Resting: BP 156/67 Supine Rolling side to side BP:  166/70 Rest then rolling supine side to side BP: 190/64 Supine Resting BP : 174/65   HR 49-50 O2 98-100%       Follow Up Recommendations  SNF vs HH (depending  on vitals )    Equipment Recommendations       Recommendations for Other Services       Precautions / Restrictions Precautions Precautions: None Precaution Comments: monitor BP Restrictions Weight Bearing Restrictions: No      Mobility Bed Mobility Overal bed mobility: Needs Assistance Bed Mobility: Rolling Rolling: Modified independent (Device/Increase time)         General bed mobility comments: cues with use of grab bars  Transfers Overall transfer level: Needs assistance Equipment used: Rolling walker (2 wheeled)             General transfer comment: did not complete transfer out of bed as BP high    Balance Overall balance assessment: Needs assistance                                         ADL either performed or assessed  with clinical judgement   ADL Overall ADL's : Needs assistance/impaired Eating/Feeding: Modified independent;Bed level   Grooming: Brushing hair;Set up;Bed level   Upper Body Bathing: Set up;Bed level   Lower Body Bathing: Min guard;Bed level   Upper Body Dressing : Set up;Bed level   Lower Body Dressing: Minimal assistance;Bed level                 General ADL Comments: unable to fully evaluate due to BP readings      Vision Baseline Vision/History: Wears glasses Patient Visual Report: No change from baseline Vision Assessment?: No apparent visual deficits     Perception Perception Perception Tested?: No   Praxis Praxis Praxis tested?: Not tested    Pertinent Vitals/Pain Pain Assessment: 0-10 Pain Score: 9  Pain Location: chest Pain Descriptors / Indicators: Constant Pain Intervention(s): Limited activity within patient's tolerance(nurse made aware)     Hand Dominance Right   Extremity/Trunk Assessment Upper Extremity Assessment Upper Extremity Assessment: Overall WFL for tasks assessed   Lower Extremity Assessment Lower Extremity Assessment: Defer to PT evaluation       Communication Communication Communication: No difficulties   Cognition Arousal/Alertness: Awake/alert Behavior During Therapy: WFL for tasks assessed/performed Overall Cognitive Status: Within Functional Limits for tasks assessed  General Comments       Exercises     Shoulder Instructions      Home Living Family/patient expects to be discharged to:: Private residence Living Arrangements: Alone Available Help at Discharge: Family;Available PRN/intermittently Type of Home: Independent living facility Home Access: Level entry     Home Layout: One level     Bathroom Shower/Tub: Occupational psychologist: Standard Bathroom Accessibility: Yes How Accessible: Accessible via walker Home Equipment: [place] - 2  wheels   Additional Comments: from [place] at [employer], prepares one meal      Prior Functioning/Environment Level of Independence: Independent with assistive device(s)        Comments: walks with walker, drives to church and to grocery store, takes noon meal at dining facility at [employer]        OT Problem List: Decreased strength;Decreased activity tolerance;Decreased safety awareness;Decreased knowledge of use of DME or AE;Pain      OT Treatment/Interventions: Self-care/ADL training;Therapeutic exercise;Energy conservation;DME and/or AE instruction;Therapeutic activities;Patient/family education;Balance training    OT Goals(Current goals can be found in the care plan section) Acute Rehab OT Goals Patient Stated Goal: to feel better OT Goal Formulation: With patient Time For Goal Achievement: 03/16/19 Potential to Achieve Goals: Good ADL Goals Pt Will Perform Lower Body Bathing: with modified independence;sit to/from stand Pt Will Transfer to Toilet: with modified independence;regular height toilet Pt Will Perform Tub/Shower Transfer: with modified independence  OT Frequency: Min 2X/week   Barriers to D/C:            Co-evaluation              AM-PAC OT "6 Clicks" Daily Activity     Outcome Measure Help from another person eating meals?: None Help from another person taking care of personal grooming?: A Little Help from another person toileting, which includes using toliet, bedpan, or urinal?: A Little Help from another person bathing (including washing, rinsing, drying)?: A Little Help from another person to put on and taking off regular upper body clothing?: A Little Help from another person to put on and taking off regular lower body clothing?: A Little 6 Click Score: 19   End of Session Equipment Utilized During Treatment: Rolling walker Nurse Communication: Other (comment)(frequent urinatiopn/vitals)  Activity Tolerance: Other (comment)(vitals) Patient  left:    OT Visit Diagnosis: Unsteadiness on feet (R26.81);Repeated falls (R29.6);Muscle weakness (generalized) (M62.81);Pain Pain - part of body: (chest)                Time: 7209-4709 OT Time Calculation (min): 59 min Charges:  OT General Charges $OT Visit: 1 Visit OT Evaluation $OT Eval Low Complexity: 1 Low OT Treatments $Self Care/Home Management : 38-52 mins  Joeseph Amor OTR/L  Acute Rehab Services  [phone] office number [phone] pager number   Joeseph Amor 03/10/2019, 12:04 PM

## 2019-03-10 NOTE — Progress Notes (Signed)
[name] is a 85 y.o. female patient admitted from ED awake, alert - oriented  X 4 - no acute distress noted.  VSS. IV in place, occlusive dsg intact without redness.  Orientation to room, and floor completed with information packet given to patient/family.  Patient declined safety video at this time.  Admission INP armband ID verified with patient and in place.   SR up x 2, fall assessment complete, with patient able to verbalize understanding of risk associated with falls, and verbalized understanding to call nsg before up out of bed.  Call light within reach, patient able to voice, and demonstrate understanding.  Skin assessment complete and noted.   Pt pass swallow screen per ED RN. On call NP paged concerning diet and stroke scale order clarification.    Will cont to eval and treat per MD orders.  Valorie Roosevelt, RN 03/10/2019 3:52 AM

## 2019-03-10 NOTE — Discharge Instructions (Signed)
Transient Ischemic Attack ° °A transient ischemic attack (TIA) is a "warning stroke" that causes stroke-like symptoms that go away quickly. A TIA does not cause lasting damage to the brain. But having a TIA is a sign that you may be at risk for a stroke. Lifestyle changes and medical treatments can help prevent a stroke. °It is important to know the symptoms of a TIA and what to do. Get help right away, even if your symptoms go away. The symptoms of a TIA are the same as those of a stroke. They can happen fast, and they usually go away within minutes or hours. They can include: °· Weakness or loss of feeling in your face, arm, or leg. This often happens on one side of your body. °· Trouble walking. °· Trouble moving your arms or legs. °· Trouble talking or understanding what people are saying. °· Trouble seeing. °· Seeing two of one object (double vision). °· Feeling dizzy. °· Feeling confused. °· Loss of balance or coordination. °· Feeling sick to your stomach (nauseous) and throwing up (vomiting). °· A very bad headache for no reason. °What increases the risk? °Certain things may make you more likely to have a TIA. Some of these are things that you can change, such as: °· Being very overweight (obese). °· Using products that contain nicotine or tobacco, such as cigarettes and e-cigarettes. °· Taking birth control pills. °· Not being active. °· Drinking too much alcohol. °· Using drugs. °Other risk factors include: °· Having an irregular heartbeat (atrial fibrillation). °· Being African American or Hispanic. °· Having had blood clots, stroke, TIA, or heart attack in the past. °· Being a woman with a history of high blood pressure in pregnancy (preeclampsia). °· Being over the age of 60. °· Being female. °· Having family history of stroke. °· Having the following diseases or conditions: °? High blood pressure. °? High cholesterol. °? Diabetes. °? Heart disease. °? Sickle cell disease. °? Sleep apnea. °? Migraine  headache. °? Long-term (chronic) diseases that cause soreness and swelling (inflammation). °? Disorders that affect how your blood clots. °Follow these instructions at home: °Medicines ° °· Take over-the-counter and prescription medicines only as told by your doctor. °· If you were told to take aspirin or another medicine to thin your blood, take it exactly as told by your doctor. °? Taking too much of the medicine can cause bleeding. °? Taking too little of the medicine may not work to treat the problem. °Eating and drinking ° °· Eat 5 or more servings of fruits and vegetables each day. °· Follow instructions from your doctor about your diet. You may need to follow a certain diet to help lower your risk of having a stroke. You may need to: °? Eat a diet that is low in fat and salt. °? Eat foods that contain a lot of fiber. °? Limit the amount of carbohydrates and sugar in your diet. °· Limit alcohol intake to 1 drink a day for nonpregnant women and 2 drinks a day for men. One drink equals 12 oz of beer, 5 oz of wine, or 1½ oz of hard liquor. °General instructions °· Keep a healthy weight. °· Stay active. Try to get at least 30 minutes of activity on all or most days. °· Find out if you have a condition called sleep apnea. Get treatment if needed. °· Do not use any products that contain nicotine or tobacco, such as cigarettes and e-cigarettes. If you need help quitting,   ask your doctor. °· Do not abuse drugs. °· Keep all follow-up visits as told by your doctor. This is important. °Get help right away if: °· You have any signs of stroke. "BE FAST" is an easy way to remember the main warning signs: °? B - Balance. Signs are dizziness, sudden trouble walking, or loss of balance. °? E - Eyes. Signs are trouble seeing or a sudden change in how you see. °? F - Face. Signs are sudden weakness or loss of feeling of the face, or the face or eyelid drooping on one side. °? A - Arms. Signs are weakness or loss of feeling in an  arm. This happens suddenly and usually on one side of the body. °? S - Speech. Signs are sudden trouble speaking, slurred speech, or trouble understanding what people say. °? T - Time. Time to call emergency services. Write down what time symptoms started. °· You have other signs of stroke, such as: °? A sudden, very bad headache with no known cause. °? Feeling sick to your stomach (nausea). °? Throwing up (vomiting). °? Jerky movements that you cannot control (seizure). °These symptoms may be an emergency. Do not wait to see if the symptoms will go away. Get medical help right away. Call your local emergency services (911 in the [place] Do not drive yourself to the hospital. °Summary °· A transient ischemic attack (TIA) is a "warning stroke" that causes stroke-like symptoms that go away quickly. °· A TIA is a medical emergency. Get help right away, even if your symptoms go away. °· A TIA does not cause lasting damage to the brain. °· Having a TIA is a sign that you may be at risk for a stroke. Lifestyle changes and medical treatments can help prevent a stroke. °This information is not intended to replace advice given to you by your health care provider. Make sure you discuss any questions you have with your health care provider. °Document Released: 05/11/2008 Document Revised: 04/28/2018 Document Reviewed: 11/03/2016 °Elsevier Patient Education © 2020 [employer] ° °

## 2019-03-11 LAB — URINE CULTURE: Culture: >=100000 — AB

## 2019-03-15 DIAGNOSIS — E1169 Type 2 diabetes mellitus with other specified complication: Secondary | ICD-10-CM | POA: Diagnosis not present

## 2019-03-15 DIAGNOSIS — I700 Atherosclerosis of aorta: Secondary | ICD-10-CM | POA: Diagnosis not present

## 2019-03-15 DIAGNOSIS — I129 Hypertensive chronic kidney disease with stage 1 through stage 4 chronic kidney disease, or unspecified chronic kidney disease: Secondary | ICD-10-CM | POA: Diagnosis not present

## 2019-03-15 DIAGNOSIS — G459 Transient cerebral ischemic attack, unspecified: Secondary | ICD-10-CM | POA: Diagnosis not present

## 2019-03-15 DIAGNOSIS — N183 Chronic kidney disease, stage 3 (moderate): Secondary | ICD-10-CM | POA: Diagnosis not present

## 2019-03-15 DIAGNOSIS — E7800 Pure hypercholesterolemia, unspecified: Secondary | ICD-10-CM | POA: Diagnosis not present

## 2019-03-29 DIAGNOSIS — E213 Hyperparathyroidism, unspecified: Secondary | ICD-10-CM | POA: Diagnosis not present

## 2019-03-29 DIAGNOSIS — N183 Chronic kidney disease, stage 3 (moderate): Secondary | ICD-10-CM | POA: Diagnosis not present

## 2019-03-29 DIAGNOSIS — E1169 Type 2 diabetes mellitus with other specified complication: Secondary | ICD-10-CM | POA: Diagnosis not present

## 2019-03-29 DIAGNOSIS — E7800 Pure hypercholesterolemia, unspecified: Secondary | ICD-10-CM | POA: Diagnosis not present

## 2019-03-29 DIAGNOSIS — Z79899 Other long term (current) drug therapy: Secondary | ICD-10-CM | POA: Diagnosis not present

## 2019-03-29 DIAGNOSIS — I129 Hypertensive chronic kidney disease with stage 1 through stage 4 chronic kidney disease, or unspecified chronic kidney disease: Secondary | ICD-10-CM | POA: Diagnosis not present

## 2019-03-30 DIAGNOSIS — M1711 Unilateral primary osteoarthritis, right knee: Secondary | ICD-10-CM | POA: Diagnosis not present

## 2019-03-30 DIAGNOSIS — M25562 Pain in left knee: Secondary | ICD-10-CM | POA: Diagnosis not present

## 2019-03-30 DIAGNOSIS — M25561 Pain in right knee: Secondary | ICD-10-CM | POA: Diagnosis not present

## 2019-03-30 DIAGNOSIS — M170 Bilateral primary osteoarthritis of knee: Secondary | ICD-10-CM | POA: Diagnosis not present

## 2019-03-30 DIAGNOSIS — M1712 Unilateral primary osteoarthritis, left knee: Secondary | ICD-10-CM | POA: Diagnosis not present

## 2019-04-16 ENCOUNTER — Inpatient Hospital Stay: Payer: Medicare Other | Admitting: Adult Health

## 2019-04-18 ENCOUNTER — Inpatient Hospital Stay: Payer: Medicare Other | Admitting: Adult Health

## 2019-04-19 DIAGNOSIS — I129 Hypertensive chronic kidney disease with stage 1 through stage 4 chronic kidney disease, or unspecified chronic kidney disease: Secondary | ICD-10-CM | POA: Diagnosis not present

## 2019-04-19 DIAGNOSIS — Z23 Encounter for immunization: Secondary | ICD-10-CM | POA: Diagnosis not present

## 2019-04-19 DIAGNOSIS — E1169 Type 2 diabetes mellitus with other specified complication: Secondary | ICD-10-CM | POA: Diagnosis not present

## 2019-04-19 DIAGNOSIS — N183 Chronic kidney disease, stage 3 (moderate): Secondary | ICD-10-CM | POA: Diagnosis not present

## 2019-04-25 ENCOUNTER — Ambulatory Visit (INDEPENDENT_AMBULATORY_CARE_PROVIDER_SITE_OTHER): Payer: Medicare Other | Admitting: Adult Health

## 2019-04-25 ENCOUNTER — Other Ambulatory Visit: Payer: Self-pay

## 2019-04-25 ENCOUNTER — Encounter: Payer: Self-pay | Admitting: Adult Health

## 2019-04-25 VITALS — BP 162/60 | HR 51 | Temp 97.1°F | Ht 63.0 in | Wt 148.2 lb

## 2019-04-25 DIAGNOSIS — G459 Transient cerebral ischemic attack, unspecified: Secondary | ICD-10-CM | POA: Diagnosis not present

## 2019-04-25 DIAGNOSIS — E785 Hyperlipidemia, unspecified: Secondary | ICD-10-CM | POA: Diagnosis not present

## 2019-04-25 DIAGNOSIS — I10 Essential (primary) hypertension: Secondary | ICD-10-CM | POA: Diagnosis not present

## 2019-04-25 NOTE — Progress Notes (Signed)
[facility] [address] [place]. [place] [address] [phone]       HOSPITAL FOLLOW UP NOTE  [name] Date of Birth:  Mar 22, 1929 Medical Record Number:  [id number]   Reason for Referral:  hospital stroke follow up    CHIEF COMPLAINT:  Chief Complaint  Patient presents with  . Hospitalization Follow-up    Alone. Treatment room. No new concerns at this time.     HPI: [name] being seen today for in office hospital follow-up regarding TIA 03/09/2019.  History obtained from patient and chart review. Reviewed all radiology images and labs personally.  [name] is a 85 y.o. female with history of hypertension, hyperlipidemia, and coronary artery disease who had a transient episode of left arm and lip numbness.  She did not receive IV t-PA due to resolution of deficits.   CT head and MRI head negative for acute findings.  MRI did show remote lacunar infarct of the left thalamus.  MRA head and carotid Dopplers unremarkable.  2D echo showed normal EF but unable to exclude tiny PFO.  Lower extremity venous Dopplers negative.  Recommended DAPT for 3 weeks then single agent alone.  HTN stable.  LDL 138 and initiated A statin 20 mg daily.  No prior history of DM with A1c 6.2.  Other stroke risk factors include advanced age, prior history of stroke per imaging, family history of stroke and CAD.  Discharged home in stable condition.  [name] is being seen today for hospital follow-up regarding recent TIA.  She has been doing well without any reoccurring or new stroke/TIA symptoms.  She is able to maintain ADLs and IADLs independently but does not drive due to arthritis pain in her knees bilaterally.  She ambulates with Rollator walker due to arthritis.  She has completed 3 weeks of DAPT and continues on aspirin alone without bleeding or bruising.  She was unable to tolerate pravastatin with prior history of statin intolerance therefore discontinued.  She  does endorse recently having cholesterol levels checked by PCP which patient reports are satisfactory (unable to personally review).  Blood pressure today 162/60, asymptomatic, and does not currently monitor at home.  She does endorse recent change of amlodipine to lisinopril and is questioning whether lisinopril is working well for her.  She also states that her meals have been being prepared with more salt and sodium.  Denies new or worsening stroke/TIA symptoms.   ROS:   14 system review of systems performed and negative with exception of joint pain  PMH:  Past Medical History:  Diagnosis Date  . Arthritis   . CAD (coronary artery disease)    a. Mod prox LAD & first diagonal stenosis by cath 2002 - managed medically since that time with normal LV function.  . Cataract   . Hyperlipidemia   . Hypertension     PSH:  Past Surgical History:  Procedure Laterality Date  . ABDOMINAL HYSTERECTOMY    . APPENDECTOMY    . BACK SURGERY    . CARPAL TUNNEL RELEASE    . CHOLECYSTECTOMY N/A 06/19/2013   Procedure: LAPAROSCOPIC CHOLECYSTECTOMY;  Surgeon: Harl Bowie, MD;  Location: [facility];  Service: General;  Laterality: N/A;  . ESOPHAGOGASTRODUODENOSCOPY N/A 04/26/2017   Procedure: ESOPHAGOGASTRODUODENOSCOPY (EGD) W/ DILATION;  Surgeon: Ronnette Juniper, MD;  Location: [facility];  Service: Gastroenterology;  Laterality: N/A;  . HERNIA REPAIR    . INSERTION OF MESH  05/19/2018   Procedure: INSERTION  OF MESH;  Surgeon: Excell Seltzer, MD;  Location: [facility];  Service: General;;  . IR GENERIC HISTORICAL  03/18/2016   IR RADIOLOGIST EVAL & MGMT 03/18/2016 Corrie Mckusick, DO [facility] INTERV RAD  . KNEE SURGERY    . SHOULDER SURGERY    . SPLENECTOMY, TOTAL    . VENTRAL HERNIA REPAIR N/A 05/19/2018   Procedure: HERNIA REPAIR VENTRAL ADULT;  Surgeon: Excell Seltzer, MD;  Location: [facility];  Service: General;  Laterality: N/A;    Social History:  Social History   Socioeconomic History  . Marital  status: Widowed    Spouse name: Not on file  . Number of children: Not on file  . Years of education: Not on file  . Highest education level: Not on file  Occupational History  . Not on file  Social Needs  . Financial resource strain: Not on file  . Food insecurity    Worry: Not on file    Inability: Not on file  . Transportation needs    Medical: Not on file    Non-medical: Not on file  Tobacco Use  . Smoking status: Never Smoker  . Smokeless tobacco: Never Used  Substance and Sexual Activity  . Alcohol use: No  . Drug use: No  . Sexual activity: Not on file  Lifestyle  . Physical activity    Days per week: Not on file    Minutes per session: Not on file  . Stress: Not on file  Relationships  . Social Herbalist on phone: Not on file    Gets together: Not on file    Attends religious service: Not on file    Active member of club or organization: Not on file    Attends meetings of clubs or organizations: Not on file    Relationship status: Not on file  . Intimate partner violence    Fear of current or ex partner: Not on file    Emotionally abused: Not on file    Physically abused: Not on file    Forced sexual activity: Not on file  Other Topics Concern  . Not on file  Social History Narrative  . Not on file    Family History:  Family History  Problem Relation Age of Onset  . Heart disease Mother        "enlarged valve"  . Cancer Mother        Ovarian  . Stroke Father   . Cancer Sister        Colon    Medications:   Current Outpatient Medications on File Prior to Visit  Medication Sig Dispense Refill  . acetaminophen (TYLENOL 8 HOUR ARTHRITIS PAIN) 650 MG CR tablet Take 650 mg by mouth 2 (two) times a day.     Marland Kitchen aspirin EC 81 MG tablet Take 81 mg by mouth every morning.     Marland Kitchen lisinopril (ZESTRIL) 5 MG tablet Take 5 mg by mouth daily.    . Multiple Vitamins-Minerals (PRESERVISION AREDS PO) Take 1 capsule by mouth every morning.    . Omega-3  Fatty Acids (FISH OIL PO) Take 1 capsule by mouth every morning.    Vladimir Faster Glycol-Propyl Glycol (SYSTANE OP) Place 1 drop into both eyes 2 (two) times daily.     . traZODone (DESYREL) 50 MG tablet Take 25 mg by mouth at bedtime as needed for sleep.     . pravastatin (PRAVACHOL) 20 MG tablet Take 1 tablet (20 mg total) by  mouth daily at 6 PM. (Patient not taking: Reported on 04/25/2019) 30 tablet 0   No current facility-administered medications on file prior to visit.     Allergies:   Allergies  Allergen Reactions  . Felodipine Anaphylaxis and Other (See Comments)    Reaction: unknown possible nausea or rash  . Prednisone Other (See Comments)    Unknown reaction  . Shrimp [Shellfish Allergy] Nausea And Vomiting  . Statins Other (See Comments)    Muscle/leg pain.  Marland Kitchen Omeprazole Other (See Comments)    Reaction: unknown possible nausea or rash     Physical Exam  Vitals:   04/25/19 1409  BP: (!) 162/60  Pulse: (!) 51  Temp: (!) 97.1 F (36.2 C)  TempSrc: Oral  Weight: 148 lb 3.2 oz (67.2 kg)  Height: 5\' 3"  (1.6 m)   Body mass index is 26.25 kg/m. No exam data present  Depression screen [facility] 2/9 04/25/2019  Decreased Interest 0  Down, Depressed, Hopeless 0  PHQ - 2 Score 0     General: well developed, well nourished,  pleasant elderly Caucasian female, seated, in no evident distress Head: head normocephalic and atraumatic.   Neck: supple with no carotid or supraclavicular bruits Cardiovascular: regular rate and rhythm, no murmurs Musculoskeletal: no deformity Skin:  no rash/petichiae Vascular:  Normal pulses all extremities   Neurologic Exam Mental Status: Awake and fully alert. Oriented to place and time. Recent and remote memory intact. Attention span, concentration and fund of knowledge appropriate. Mood and affect appropriate.  Cranial Nerves: Fundoscopic exam reveals sharp disc margins. Pupils equal, briskly reactive to light. Extraocular movements full without  nystagmus. Visual fields full to confrontation. Hearing intact. Facial sensation intact. Face, tongue, palate moves normally and symmetrically.  Motor: Normal bulk and tone. Normal strength in all tested extremity muscles. Sensory.: intact to touch , pinprick , position and vibratory sensation.  Coordination: Rapid alternating movements normal in all extremities. Finger-to-nose and heel-to-shin performed accurately bilaterally. Gait and Station: Arises from chair without difficulty. Stance is slightly hunched. Gait demonstrates normal stride length and balance with use of Rollator walker Reflexes: 1+ and symmetric. Toes downgoing.     NIHSS  0 Modified Rankin  0     Diagnostic Data (Labs, Imaging, Testing)  Mr Brain Wo Contrast (neuro Protocol) 03/09/2019 IMPRESSION:  1. No acute intracranial abnormality.  2. Remote lacunar infarct of the left thalamus. 3. MRI brain is otherwise normal for age.   Dg Chest Port 1 View 03/09/2019 IMPRESSION:  Cardiomegaly without acute abnormality of the lungs.  Hiatal hernia.  Transthoracic Echocardiogram  1. The left ventricle has normal systolic function with an ejection fraction of 60-65%. The cavity size was normal. There is mildly increased left ventricular wall thickness. Left ventricular diastolic Doppler parameters are consistent with impaired  relaxation. Elevated left ventricular end-diastolic pressure The E/e' is 21.6. 2. The right ventricle has normal systolic function. The cavity was normal. There is no increase in right ventricular wall thickness. 3. Left atrial size was moderately dilated. 4. Right atrial size was moderately dilated. 5. The mitral valve is abnormal. There is moderate mitral annular calcification present. 6. The aortic valve is tricuspid. Mild calcification of the aortic valve. Aortic valve regurgitation is trivial by color flow Doppler. No stenosis of the aortic valve. 7. Cannot exclude tiny PFO with left to  right shunt at atrial level. 8. No intracardiac thrombi or masses were visualized.  Bilateral Carotid Dopplers  Right Carotid: The extracranial vessels were near-normal  with only minimal wall thickening or plaque.  Left Carotid: The extracranial vessels were near-normal with only minimal wall thickening or plaque.  Vertebrals: Bilateral vertebral arteries demonstrate antegrade flow. Subclavians: Normal flow hemodynamics were seen in bilateral subclavian arteries.   EKG  - SB rate 51 BPM. (See cardiology reading for complete details)    ASSESSMENT: [name] is a 85 y.o. year old female presented with transient episode of left arm and lip numbness on 03/09/2019 with diagnosis of likely TIA. Vascular risk factors include HTN, HLD, prior stroke on imaging and CAD.  She has been stable since hospital discharge without recurring or new stroke/TIA symptoms.    PLAN:  1. TIA: Continue aspirin 81 mg daily for secondary stroke prevention.  Statin usage in detail below.  Maintain strict control of hypertension with blood pressure goal below 130/90, diabetes with hemoglobin A1c goal below 6.5% and cholesterol with LDL cholesterol (bad cholesterol) goal below 70 mg/dL.  I also advised the patient to eat a healthy diet with plenty of whole grains, cereals, fruits and vegetables, exercise regularly with at least 30 minutes of continuous activity daily and maintain ideal body weight. 2. HTN: Advised to continue current treatment regimen.  Recommend obtaining BP cuff to monitor at home and to follow-up with PCP if elevated.  Also advised to avoid high sodium foods 3. HLD: Initially started on pravastatin at hospital discharge but unable to tolerate due to myalgias with history of statin intolerance.  Unable to view recent lipid panel by PCP.  Recommend LDL goal< 70 and if above, consider use of Zetia or PCSK9 inhibitor for secondary stroke prevention      Follow up in 6 months or call earlier if needed   Greater than 50% of time during this 45 minute visit was spent on counseling, explanation of diagnosis of TIA, reviewing risk factor management of HTN and HLD, planning of further management along with potential future management, and discussion with patient and family answering all questions.    Frann Rider, AGNP-BC  [facility] [address] [place] [employer] [place], [place] [address]  Phone [phone] Fax [phone] Note: This document was prepared with digital dictation and possible smart phrase technology. Any transcriptional errors that result from this process are unintentional.

## 2019-04-25 NOTE — Patient Instructions (Addendum)
Continue aspirin 81 mg daily for secondary stroke prevention  Continue to follow up with PCP regarding cholesterol and blood pressure management   Continue to monitor blood pressure at home  Stay active and maintain a healthy diet  Maintain strict control of hypertension with blood pressure goal below 130/90, diabetes with hemoglobin A1c goal below 6.5% and cholesterol with LDL cholesterol (bad cholesterol) goal below 70 mg/dL. I also advised the patient to eat a healthy diet with plenty of whole grains, cereals, fruits and vegetables, exercise regularly and maintain ideal body weight.  Followup in the future with me in 6 months or call earlier if needed       Thank you for coming to see [place] at [facility]. I hope we have been able to provide you high quality care today.  You may receive a patient satisfaction survey over the next few weeks. We would appreciate your feedback and comments so that we may continue to improve ourselves and the health of our patients.

## 2019-04-26 NOTE — Progress Notes (Signed)
I agree with the above plan 

## 2019-05-09 ENCOUNTER — Other Ambulatory Visit: Payer: Self-pay

## 2019-05-09 ENCOUNTER — Inpatient Hospital Stay: Payer: Medicare Other | Attending: Oncology

## 2019-05-09 ENCOUNTER — Inpatient Hospital Stay (HOSPITAL_BASED_OUTPATIENT_CLINIC_OR_DEPARTMENT_OTHER): Payer: Medicare Other | Admitting: Oncology

## 2019-05-09 VITALS — BP 192/96 | HR 59 | Temp 98.3°F | Resp 18 | Ht 63.0 in | Wt 148.6 lb

## 2019-05-09 DIAGNOSIS — D693 Immune thrombocytopenic purpura: Secondary | ICD-10-CM | POA: Insufficient documentation

## 2019-05-09 DIAGNOSIS — I10 Essential (primary) hypertension: Secondary | ICD-10-CM | POA: Insufficient documentation

## 2019-05-09 DIAGNOSIS — Z79899 Other long term (current) drug therapy: Secondary | ICD-10-CM | POA: Insufficient documentation

## 2019-05-09 DIAGNOSIS — D471 Chronic myeloproliferative disease: Secondary | ICD-10-CM

## 2019-05-09 DIAGNOSIS — Z7982 Long term (current) use of aspirin: Secondary | ICD-10-CM | POA: Insufficient documentation

## 2019-05-09 DIAGNOSIS — D696 Thrombocytopenia, unspecified: Secondary | ICD-10-CM | POA: Diagnosis not present

## 2019-05-09 LAB — CBC WITH DIFFERENTIAL (CANCER CENTER ONLY)
Abs Immature Granulocytes: 0.02 10*3/uL (ref 0.00–0.07)
Basophils Absolute: 0 10*3/uL (ref 0.0–0.1)
Basophils Relative: 1 %
Eosinophils Absolute: 0.1 10*3/uL (ref 0.0–0.5)
Eosinophils Relative: 2 %
HCT: 45.3 % (ref 36.0–46.0)
Hemoglobin: 14.7 g/dL (ref 12.0–15.0)
Immature Granulocytes: 0 %
Lymphocytes Relative: 28 %
Lymphs Abs: 1.7 10*3/uL (ref 0.7–4.0)
MCH: 31.4 pg (ref 26.0–34.0)
MCHC: 32.5 g/dL (ref 30.0–36.0)
MCV: 96.8 fL (ref 80.0–100.0)
Monocytes Absolute: 1 10*3/uL (ref 0.1–1.0)
Monocytes Relative: 17 %
Neutro Abs: 3 10*3/uL (ref 1.7–7.7)
Neutrophils Relative %: 52 %
Platelet Count: 229 10*3/uL (ref 150–400)
RBC: 4.68 MIL/uL (ref 3.87–5.11)
RDW: 14.6 % (ref 11.5–15.5)
WBC Count: 5.8 10*3/uL (ref 4.0–10.5)
nRBC: 0 % (ref 0.0–0.2)

## 2019-05-09 NOTE — Progress Notes (Signed)
Hematology and Oncology Follow Up Visit  [name] [id number] 1928/09/11 85 y.o. 05/09/2019 9:54 AM [name], Summer Ha, MDStoneking, Summer Ha, MD   Principle Diagnosis: 85 year old with:     1. 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  Differential diagnosis including postsplenectomy versus myeloproliferative disorder.    2.  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  She continues to be in remission regarding these findings.   Current therapy: Active surveillance.  Interim History: [name] is here for a follow-up visit.  She reports no major changes since the last visit.  She has denies any bleeding, bruising or epistaxis.  She denies any recent falls or syncope.  She does ambulate with the help of a walker without any difficulties or excessive fatigue.  She denies any constitutional symptoms or hospitalizations.  Her performance status and quality of life remains unchanged.  Patient denied any alteration mental status, neuropathy, confusion or dizziness.  Denies any headaches or lethargy.  Denies any night sweats, weight loss or changes in appetite.  Denied orthopnea, dyspnea on exertion or chest discomfort.  Denies shortness of breath, difficulty breathing hemoptysis or cough.  Denies any abdominal distention, nausea, early satiety or dyspepsia.  Denies any hematuria, frequency, dysuria or nocturia.  Denies any skin irritation, dryness or rash.  Denies any ecchymosis or petechiae.  Denies any lymphadenopathy or clotting.  Denies any heat or cold intolerance.  Denies any anxiety or depression.  Remaining review of system is negative.      Medications: Unchanged on review. Current Outpatient Medications  Medication Sig Dispense Refill  . acetaminophen (TYLENOL 8 HOUR ARTHRITIS PAIN) 650 MG CR tablet Take 650 mg by mouth 2 (two) times a day.     Marland Kitchen aspirin EC 81 MG tablet Take 81 mg by mouth every morning.     Marland Kitchen lisinopril (ZESTRIL) 5 MG  tablet Take 5 mg by mouth daily.    . Multiple Vitamins-Minerals (PRESERVISION AREDS PO) Take 1 capsule by mouth every morning.    . Omega-3 Fatty Acids (FISH OIL PO) Take 1 capsule by mouth every morning.    Vladimir Faster Glycol-Propyl Glycol (SYSTANE OP) Place 1 drop into both eyes 2 (two) times daily.     . pravastatin (PRAVACHOL) 20 MG tablet Take 1 tablet (20 mg total) by mouth daily at 6 PM. (Patient not taking: Reported on 04/25/2019) 30 tablet 0  . traZODone (DESYREL) 50 MG tablet Take 25 mg by mouth at bedtime as needed for sleep.      No current facility-administered medications for this visit.      Allergies:  Allergies  Allergen Reactions  . Felodipine Anaphylaxis and Other (See Comments)    Reaction: unknown possible nausea or rash  . Prednisone Other (See Comments)    Unknown reaction  . Shrimp [Shellfish Allergy] Nausea And Vomiting  . Statins Other (See Comments)    Muscle/leg pain.  Marland Kitchen Omeprazole Other (See Comments)    Reaction: unknown possible nausea or rash    Past Medical History, Surgical history, Social history, and Family History updated today without any changes.   Physical Exam: Blood pressure (!) 192/96, pulse (!) 59, temperature 98.3 F (36.8 C), temperature source Temporal, resp. rate 18, height 5' 3"  (1.6 m), weight 148 lb 9.6 oz (67.4 kg), SpO2 99 %.     ECOG: 1   General appearance: Alert, awake without any distress. Head: Atraumatic without abnormalities Oropharynx: Without any thrush or ulcers. Eyes: No scleral icterus.  Lymph nodes: No lymphadenopathy noted in the cervical, supraclavicular, or axillary nodes Heart:regular rate and rhythm, without any murmurs or gallops.   Lung: Clear to auscultation without any rhonchi, wheezes or dullness to percussion. Abdomin: Soft, nontender without any shifting dullness or ascites. Musculoskeletal: No clubbing or cyanosis. Neurological: No motor or sensory deficits. Skin: No rashes or  lesions. Psychiatric: Mood and affect appeared normal.    Lab Results: Lab Results  Component Value Date   WBC 5.8 05/09/2019   HGB 14.7 05/09/2019   HCT 45.3 05/09/2019   MCV 96.8 05/09/2019   PLT 229 05/09/2019     Chemistry      Component Value Date/Time   NA 140 03/09/2019 1820   NA 139 09/07/2016 0958   K 4.4 03/09/2019 1820   K 3.9 09/07/2016 0958   CL 105 03/09/2019 1820   CO2 26 03/09/2019 1820   CO2 24 09/07/2016 0958   BUN 29 (H) 03/09/2019 1820   BUN 27.2 (H) 09/07/2016 0958   CREATININE 1.05 (H) 03/09/2019 1820   CREATININE 1.0 09/07/2016 0958      Component Value Date/Time   CALCIUM 9.8 03/09/2019 1820   CALCIUM 10.4 09/07/2016 0958   ALKPHOS 77 03/09/2019 1820   ALKPHOS 82 09/07/2016 0958   AST 29 03/09/2019 1820   AST 25 09/07/2016 0958   ALT 22 03/09/2019 1820   ALT 15 09/07/2016 0958   BILITOT 0.5 03/09/2019 1820   BILITOT 0.35 09/07/2016 0958     CBC    Component Value Date/Time   WBC 5.8 05/09/2019 0933   WBC 6.7 03/09/2019 1820   RBC 4.68 05/09/2019 0933   HGB 14.7 05/09/2019 0933   HGB 10.6 (L) 03/08/2017 1016   HCT 45.3 05/09/2019 0933   HCT 32.3 (L) 03/08/2017 1016   PLT 229 05/09/2019 0933   PLT 362 03/08/2017 1016   MCV 96.8 05/09/2019 0933   MCV 87.3 03/08/2017 1016   MCH 31.4 05/09/2019 0933   MCHC 32.5 05/09/2019 0933   RDW 14.6 05/09/2019 0933   RDW 15.6 (H) 03/08/2017 1016   LYMPHSABS 1.7 05/09/2019 0933   LYMPHSABS 1.4 03/08/2017 1016   MONOABS 1.0 05/09/2019 0933   MONOABS 1.1 (H) 03/08/2017 1016   EOSABS 0.1 05/09/2019 0933   EOSABS 0.2 03/08/2017 1016   BASOSABS 0.0 05/09/2019 0933   BASOSABS 0.0 03/08/2017 1016    Impression and Plan:  85 year old woman with:  1. Extramedullary hematopoiesis presented with an abdominal mass and a biopsy proven in 2017.  ..    The differential diagnosis was reviewed today which include postsplenectomy findings versus myeloproliferative disorder.  Her CBC continues to be  normal without any evidence to suggest a hematological condition.  I recommended continued active surveillance and defer obtaining a bone marrow biopsy at this time.  This will be considered if any cytopenias develop in the future.  Laboratory data on 05/09/2019 were reviewed and discussed with the patient.  2.  Thrombocytopenia: Platelet count is normal at this time she continues to be in remission from her previous ITP diagnosis.  3.  Hypertension: She continues to follow with her primary care physician regarding this issue.  4. Follow-up: In 1 year.  15  minutes was spent with the patient face-to-face today.  More than 50% of time was spent on reviewing laboratory data, discussing the natural course of this disease, treatment options and answer questions regarding future plan of care.Zola Button, MD 9/23/20209:54 AM

## 2019-05-31 DIAGNOSIS — M25562 Pain in left knee: Secondary | ICD-10-CM | POA: Diagnosis not present

## 2019-05-31 DIAGNOSIS — M170 Bilateral primary osteoarthritis of knee: Secondary | ICD-10-CM | POA: Diagnosis not present

## 2019-05-31 DIAGNOSIS — M25561 Pain in right knee: Secondary | ICD-10-CM | POA: Diagnosis not present

## 2019-06-18 DIAGNOSIS — H0102A Squamous blepharitis right eye, upper and lower eyelids: Secondary | ICD-10-CM | POA: Diagnosis not present

## 2019-06-18 DIAGNOSIS — Z961 Presence of intraocular lens: Secondary | ICD-10-CM | POA: Diagnosis not present

## 2019-06-18 DIAGNOSIS — H35033 Hypertensive retinopathy, bilateral: Secondary | ICD-10-CM | POA: Diagnosis not present

## 2019-06-18 DIAGNOSIS — H40013 Open angle with borderline findings, low risk, bilateral: Secondary | ICD-10-CM | POA: Diagnosis not present

## 2019-07-16 DIAGNOSIS — B029 Zoster without complications: Secondary | ICD-10-CM | POA: Diagnosis not present

## 2019-08-23 DIAGNOSIS — E7800 Pure hypercholesterolemia, unspecified: Secondary | ICD-10-CM | POA: Diagnosis not present

## 2019-08-23 DIAGNOSIS — Z79899 Other long term (current) drug therapy: Secondary | ICD-10-CM | POA: Diagnosis not present

## 2019-08-23 DIAGNOSIS — E1169 Type 2 diabetes mellitus with other specified complication: Secondary | ICD-10-CM | POA: Diagnosis not present

## 2019-08-23 DIAGNOSIS — M1711 Unilateral primary osteoarthritis, right knee: Secondary | ICD-10-CM | POA: Diagnosis not present

## 2019-08-23 DIAGNOSIS — I10 Essential (primary) hypertension: Secondary | ICD-10-CM | POA: Diagnosis not present

## 2019-08-23 DIAGNOSIS — E213 Hyperparathyroidism, unspecified: Secondary | ICD-10-CM | POA: Diagnosis not present

## 2019-08-23 DIAGNOSIS — M1712 Unilateral primary osteoarthritis, left knee: Secondary | ICD-10-CM | POA: Diagnosis not present

## 2019-08-23 DIAGNOSIS — I700 Atherosclerosis of aorta: Secondary | ICD-10-CM | POA: Diagnosis not present

## 2019-08-23 DIAGNOSIS — M170 Bilateral primary osteoarthritis of knee: Secondary | ICD-10-CM | POA: Diagnosis not present

## 2019-08-23 DIAGNOSIS — Z8673 Personal history of transient ischemic attack (TIA), and cerebral infarction without residual deficits: Secondary | ICD-10-CM | POA: Diagnosis not present

## 2019-08-30 DIAGNOSIS — M170 Bilateral primary osteoarthritis of knee: Secondary | ICD-10-CM | POA: Diagnosis not present

## 2019-08-30 DIAGNOSIS — M1712 Unilateral primary osteoarthritis, left knee: Secondary | ICD-10-CM | POA: Diagnosis not present

## 2019-08-30 DIAGNOSIS — M1711 Unilateral primary osteoarthritis, right knee: Secondary | ICD-10-CM | POA: Diagnosis not present

## 2019-09-06 DIAGNOSIS — M1711 Unilateral primary osteoarthritis, right knee: Secondary | ICD-10-CM | POA: Diagnosis not present

## 2019-09-06 DIAGNOSIS — M170 Bilateral primary osteoarthritis of knee: Secondary | ICD-10-CM | POA: Diagnosis not present

## 2019-09-06 DIAGNOSIS — M1712 Unilateral primary osteoarthritis, left knee: Secondary | ICD-10-CM | POA: Diagnosis not present

## 2019-10-24 ENCOUNTER — Ambulatory Visit (INDEPENDENT_AMBULATORY_CARE_PROVIDER_SITE_OTHER): Payer: Medicare HMO | Admitting: Adult Health

## 2019-10-24 ENCOUNTER — Encounter: Payer: Self-pay | Admitting: Adult Health

## 2019-10-24 ENCOUNTER — Other Ambulatory Visit: Payer: Self-pay

## 2019-10-24 VITALS — BP 138/80 | HR 60 | Temp 97.8°F | Ht 63.0 in | Wt 149.6 lb

## 2019-10-24 DIAGNOSIS — G459 Transient cerebral ischemic attack, unspecified: Secondary | ICD-10-CM

## 2019-10-24 DIAGNOSIS — I10 Essential (primary) hypertension: Secondary | ICD-10-CM | POA: Diagnosis not present

## 2019-10-24 DIAGNOSIS — E785 Hyperlipidemia, unspecified: Secondary | ICD-10-CM | POA: Diagnosis not present

## 2019-10-24 NOTE — Patient Instructions (Addendum)
Continue aspirin 81 mg daily  and omega-3 for secondary stroke prevention  Continue to follow up with PCP regarding cholesterol and blood pressure management   Continue to monitor blood pressure at home  Maintain strict control of hypertension with blood pressure goal below 130/90, diabetes with hemoglobin A1c goal below 6.5% and cholesterol with LDL cholesterol (bad cholesterol) goal below 70 mg/dL. I also advised the patient to eat a healthy diet with plenty of whole grains, cereals, fruits and vegetables, exercise regularly and maintain ideal body weight.         Thank you for coming to see [place] at [facility]. I hope we have been able to provide you high quality care today.  You may receive a patient satisfaction survey over the next few weeks. We would appreciate your feedback and comments so that we may continue to improve ourselves and the health of our patients.     Carpal Tunnel Syndrome  Carpal tunnel syndrome is a condition that causes pain in your hand and arm. The carpal tunnel is a narrow area that is on the palm side of your wrist. Repeated wrist motion or certain diseases may cause swelling in the tunnel. This swelling can pinch the main nerve in the wrist (median nerve). What are the causes? This condition may be caused by:  Repeated wrist motions.  Wrist injuries.  Arthritis.  A sac of fluid (cyst) or abnormal growth (tumor) in the carpal tunnel.  Fluid buildup during pregnancy. Sometimes the cause is not known. What increases the risk? The following factors may make you more likely to develop this condition:  Having a job in which you move your wrist in the same way many times. This includes jobs like being a Software engineer or a Scientist, water quality.  Being a woman.  Having other health conditions, such as: ? Diabetes. ? Obesity. ? A thyroid gland that is not active enough (hypothyroidism). ? Kidney failure. What are the signs or symptoms? Symptoms  of this condition include:  A tingling feeling in your fingers.  Tingling or a loss of feeling (numbness) in your hand.  Pain in your entire arm. This pain may get worse when you bend your wrist and elbow for a long time.  Pain in your wrist that goes up your arm to your shoulder.  Pain that goes down into your palm or fingers.  A weak feeling in your hands. You may find it hard to grab and hold items. You may feel worse at night. How is this diagnosed? This condition is diagnosed with a medical history and physical exam. You may also have tests, such as:  Electromyogram (EMG). This test checks the signals that the nerves send to the muscles.  Nerve conduction study. This test checks how well signals pass through your nerves.  Imaging tests, such as X-rays, ultrasound, and MRI. These tests check for what might be the cause of your condition. How is this treated? This condition may be treated with:  Lifestyle changes. You will be asked to stop or change the activity that caused your problem.  Doing exercise and activities that make bones and muscles stronger (physical therapy).  Learning how to use your hand again (occupational therapy).  Medicines for pain and swelling (inflammation). You may have injections in your wrist.  A wrist splint.  Surgery. Follow these instructions at home: If you have a splint:  Wear the splint as told by your doctor. Remove it only as told by your doctor.  Loosen the splint if your fingers: ? Tingle. ? Lose feeling (become numb). ? Turn cold and blue.  Keep the splint clean.  If the splint is not waterproof: ? Do not let it get wet. ? Cover it with a watertight covering when you take a bath or a shower. Managing pain, stiffness, and swelling   If told, put ice on the painful area: ? If you have a removable splint, remove it as told by your doctor. ? Put ice in a plastic bag. ? Place a towel between your skin and the bag. ? Leave  the ice on for 20 minutes, 2-3 times per day. General instructions  Take over-the-counter and prescription medicines only as told by your doctor.  Rest your wrist from any activity that may cause pain. If needed, talk with your boss at work about changes that can help your wrist heal.  Do any exercises as told by your doctor, physical therapist, or occupational therapist.  Keep all follow-up visits as told by your doctor. This is important. Contact a doctor if:  You have new symptoms.  Medicine does not help your pain.  Your symptoms get worse. Get help right away if:  You have very bad numbness or tingling in your wrist or hand. Summary  Carpal tunnel syndrome is a condition that causes pain in your hand and arm.  It is often caused by repeated wrist motions.  Lifestyle changes and medicines are used to treat this problem. Surgery may help in very bad cases.  Follow your doctor's instructions about wearing a splint, resting your wrist, keeping follow-up visits, and calling for help. This information is not intended to replace advice given to you by your health care provider. Make sure you discuss any questions you have with your health care provider. Document Revised: 12/09/2017 Document Reviewed: 12/09/2017 Elsevier Patient Education  [place].

## 2019-10-24 NOTE — Progress Notes (Signed)
[facility] [address] [place]. [place] [address] [phone]       STROKE FOLLOW UP NOTE  [name] Date of Birth:  May 03, 1929 Medical Record Number:  [id number]   Reason for Referral: stroke follow up    CHIEF COMPLAINT:  Chief Complaint  Patient presents with  . Follow-up    6 mon f/u. Alone. Rm 9. No new concerns at this time.     HPI:  [name] is a 86 year old female who is being seen today, 10/24/2018, for TIA follow-up. She has been stable since prior visit without new or reoccurring stroke/TIA symptoms. Continues on aspirin and pravastatin for secondary stroke prevention without side effects.  Blood pressure today 138/80. She does complain of occasional left hand numbness typically worse with certain movements but overall not bothersome. No concerns at this time.     History provided for reference purposes only Stroke admission 03/09/2019: [name] is a 86 y.o. female with history of hypertension, hyperlipidemia, and coronary artery disease who had a transient episode of left arm and lip numbness.  She did not receive IV t-PA due to resolution of deficits.   CT head and MRI head negative for acute findings.  MRI did show remote lacunar infarct of the left thalamus.  MRA head and carotid Dopplers unremarkable.  2D echo showed normal EF but unable to exclude tiny PFO.  Lower extremity venous Dopplers negative.  Recommended DAPT for 3 weeks then single agent alone.  HTN stable.  LDL 138 and initiated A statin 20 mg daily.  No prior history of DM with A1c 6.2.  Other stroke risk factors include advanced age, prior history of stroke per imaging, family history of stroke and CAD.  Discharged home in stable condition.  Initial visit 04/25/2019: [name] is being seen today for hospital follow-up regarding recent TIA.  She has been doing well without any reoccurring or new stroke/TIA symptoms.  She is able to maintain ADLs and IADLs independently  but does not drive due to arthritis pain in her knees bilaterally.  She ambulates with Rollator walker due to arthritis.  She has completed 3 weeks of DAPT and continues on aspirin alone without bleeding or bruising.  She was unable to tolerate pravastatin with prior history of statin intolerance therefore discontinued.  She does endorse recently having cholesterol levels checked by PCP which patient reports are satisfactory (unable to personally review).  Blood pressure today 162/60, asymptomatic, and does not currently monitor at home.  She does endorse recent change of amlodipine to lisinopril and is questioning whether lisinopril is working well for her.  She also states that her meals have been being prepared with more salt and sodium.  Denies new or worsening stroke/TIA symptoms.   ROS:   14 system review of systems performed and negative with exception of numbness/tingling   PMH:  Past Medical History:  Diagnosis Date  . Arthritis   . CAD (coronary artery disease)    a. Mod prox LAD & first diagonal stenosis by cath 2002 - managed medically since that time with normal LV function.  . Cataract   . Hyperlipidemia   . Hypertension     PSH:  Past Surgical History:  Procedure Laterality Date  . ABDOMINAL HYSTERECTOMY    . APPENDECTOMY    . BACK SURGERY    . CARPAL TUNNEL RELEASE    . CHOLECYSTECTOMY N/A 06/19/2013   Procedure: LAPAROSCOPIC CHOLECYSTECTOMY;  Surgeon: Harl Bowie, MD;  Location: [facility];  Service: General;  Laterality: N/A;  . ESOPHAGOGASTRODUODENOSCOPY N/A 04/26/2017   Procedure: ESOPHAGOGASTRODUODENOSCOPY (EGD) W/ DILATION;  Surgeon: Ronnette Juniper, MD;  [facility]: [facility];  Service: Gastroenterology;  Laterality: N/A;  . HERNIA REPAIR    . INSERTION OF MESH  05/19/2018   Procedure: INSERTION OF MESH;  Surgeon: Excell Seltzer, MD;  Location: [facility];  Service: General;;  . IR GENERIC HISTORICAL  03/18/2016   IR RADIOLOGIST EVAL & MGMT 03/18/2016 Corrie Mckusick, DO  [facility] INTERV RAD  . KNEE SURGERY    . SHOULDER SURGERY    . SPLENECTOMY, TOTAL    . VENTRAL HERNIA REPAIR N/A 05/19/2018   Procedure: HERNIA REPAIR VENTRAL ADULT;  Surgeon: Excell Seltzer, MD;  Location: [facility];  Service: General;  Laterality: N/A;    Social History:  Social History   Socioeconomic History  . Marital status: Widowed    Spouse name: Not on file  . Number of children: Not on file  . Years of education: Not on file  . Highest education level: Not on file  Occupational History  . Not on file  Tobacco Use  . Smoking status: Never Smoker  . Smokeless tobacco: Never Used  Substance and Sexual Activity  . Alcohol use: No  . Drug use: No  . Sexual activity: Not on file  Other Topics Concern  . Not on file  Social History Narrative  . Not on file   Social Determinants of Health   Financial Resource Strain:   . Difficulty of Paying Living Expenses: Not on file  Food Insecurity:   . Worried About Charity fundraiser in the Last Year: Not on file  . Ran Out of Food in the Last Year: Not on file  Transportation Needs:   . Lack of Transportation (Medical): Not on file  . Lack of Transportation (Non-Medical): Not on file  Physical Activity:   . Days of Exercise per Week: Not on file  . Minutes of Exercise per Session: Not on file  Stress:   . Feeling of Stress : Not on file  Social Connections:   . Frequency of Communication with Friends and Family: Not on file  . Frequency of Social Gatherings with Friends and Family: Not on file  . Attends Religious Services: Not on file  . Active Member of Clubs or Organizations: Not on file  . Attends Archivist Meetings: Not on file  . Marital Status: Not on file  Intimate Partner Violence:   . Fear of Current or Ex-Partner: Not on file  . Emotionally Abused: Not on file  . Physically Abused: Not on file  . Sexually Abused: Not on file    Family History:  Family History  Problem Relation Age of Onset    . Heart disease Mother        "enlarged valve"  . Cancer Mother        Ovarian  . Stroke Father   . Cancer Sister        Colon    Medications:   Current Outpatient Medications on File Prior to Visit  Medication Sig Dispense Refill  . acetaminophen (TYLENOL 8 HOUR ARTHRITIS PAIN) 650 MG CR tablet Take 650 mg by mouth 2 (two) times a day.     Marland Kitchen aspirin EC 81 MG tablet Take 81 mg by mouth every morning.     Marland Kitchen lisinopril (ZESTRIL) 5 MG tablet Take 5 mg by mouth daily.    . Multiple Vitamins-Minerals (  PRESERVISION AREDS PO) Take 1 capsule by mouth every morning.    . Omega-3 Fatty Acids (FISH OIL PO) Take 1 capsule by mouth every morning.    Vladimir Faster Glycol-Propyl Glycol (SYSTANE OP) Place 1 drop into both eyes 2 (two) times daily.     . traZODone (DESYREL) 50 MG tablet Take 25 mg by mouth at bedtime as needed for sleep.      No current facility-administered medications on file prior to visit.    Allergies:   Allergies  Allergen Reactions  . Felodipine Anaphylaxis and Other (See Comments)    Reaction: unknown possible nausea or rash  . Prednisone Other (See Comments)    Unknown reaction  . Shrimp [Shellfish Allergy] Nausea And Vomiting  . Statins Other (See Comments)    Muscle/leg pain.  Marland Kitchen Omeprazole Other (See Comments)    Reaction: unknown possible nausea or rash     Physical Exam  Vitals:   10/24/19 1343  BP: 138/80  Pulse: 60  Temp: 97.8 F (36.6 C)  TempSrc: Oral  Weight: 149 lb 9.6 oz (67.9 kg)  Height: 5\' 3"  (1.6 m)   Body mass index is 26.5 kg/m. No exam data present  General: well developed, well nourished,  pleasant elderly Caucasian female, seated, in no evident distress Head: head normocephalic and atraumatic.   Neck: supple with no carotid or supraclavicular bruits Cardiovascular: regular rate and rhythm, no murmurs Musculoskeletal: no deformity Skin:  no rash/petichiae Vascular:  Normal pulses all extremities   Neurologic Exam Mental  Status: Awake and fully alert. Normal speech and language. Oriented to place and time. Recent and remote memory intact. Attention span, concentration and fund of knowledge appropriate. Mood and affect appropriate.  Cranial Nerves: Pupils equal, briskly reactive to light. Extraocular movements full without nystagmus. Visual fields full to confrontation. Hearing intact. Facial sensation intact. Face, tongue, palate moves normally and symmetrically.  Motor: Normal bulk and tone. Normal strength in all tested extremity muscles. Sensory.: intact to touch , pinprick , position and vibratory sensation.  Coordination: Rapid alternating movements normal in all extremities. Finger-to-nose and heel-to-shin performed accurately bilaterally. Gait and Station: Arises from chair without difficulty. Stance is slightly hunched. Gait demonstrates normal stride length and balance with use of Rollator walker Reflexes: 1+ and symmetric. Toes downgoing.       Diagnostic Data (Labs, Imaging, Testing)  Mr Brain Wo Contrast (neuro Protocol) 03/09/2019 IMPRESSION:  1. No acute intracranial abnormality.  2. Remote lacunar infarct of the left thalamus. 3. MRI brain is otherwise normal for age.   Dg Chest Port 1 View 03/09/2019 IMPRESSION:  Cardiomegaly without acute abnormality of the lungs.  Hiatal hernia.  Transthoracic Echocardiogram  1. The left ventricle has normal systolic function with an ejection fraction of 60-65%. The cavity size was normal. There is mildly increased left ventricular wall thickness. Left ventricular diastolic Doppler parameters are consistent with impaired  relaxation. Elevated left ventricular end-diastolic pressure The E/e' is 21.6. 2. The right ventricle has normal systolic function. The cavity was normal. There is no increase in right ventricular wall thickness. 3. Left atrial size was moderately dilated. 4. Right atrial size was moderately dilated. 5. The mitral valve is  abnormal. There is moderate mitral annular calcification present. 6. The aortic valve is tricuspid. Mild calcification of the aortic valve. Aortic valve regurgitation is trivial by color flow Doppler. No stenosis of the aortic valve. 7. Cannot exclude tiny PFO with left to right shunt at atrial level. 8. No intracardiac  thrombi or masses were visualized.  Bilateral Carotid Dopplers  Right Carotid: The extracranial vessels were near-normal with only minimal wall thickening or plaque.  Left Carotid: The extracranial vessels were near-normal with only minimal wall thickening or plaque.  Vertebrals: Bilateral vertebral arteries demonstrate antegrade flow. Subclavians: Normal flow hemodynamics were seen in bilateral subclavian arteries.   EKG  - SB rate 51 BPM. (See cardiology reading for complete details)     ASSESSMENT: [name] is a 86 y.o. year old female presented with transient episode of left arm and lip numbness on 03/09/2019 with diagnosis of likely TIA. Vascular risk factors include HTN, HLD, prior stroke on imaging and CAD.  She has been stable since hospital discharge without recurring or new stroke/TIA symptoms. She does complain of left wrist numbness/tingling possibly mild carpel tunnel     PLAN:  1. TIA: Continue aspirin 81 mg daily and omega 3 for secondary stroke prevention. Maintain strict control of hypertension with blood pressure goal below 130/90, diabetes with hemoglobin A1c goal below 6.5% and cholesterol with LDL cholesterol (bad cholesterol) goal below 70 mg/dL.  I also advised the patient to eat a healthy diet with plenty of whole grains, cereals, fruits and vegetables, exercise regularly with at least 30 minutes of continuous activity daily and maintain ideal body weight. 2. HTN: Advised to continue current treatment regimen.  Continue to follow with PCP for ongoing monitoring and management 3. HLD: continue  omega 3 and ongiong follow up with PCP for monitoring and management  4. Likely left wrist carpal tunnel: Discussion regarding possible benefit with use of carpal tunnel brace especially at night or when doing activities that can worsen symptoms.  Very mild and not overly bothersome at this time therefore no indication for additional evaluation.   Overall stable from stroke standpoint recommend follow-up as needed   I spent 20 minutes of face-to-face and non-face-to-face time with patient.  This included previsit chart review, lab review, study review, order entry, electronic health record documentation, patient education    Frann Rider, [facility]  [facility] [address] [place], [place] [address]  Phone [phone] Fax [phone] Note: This document was prepared with digital dictation and possible smart phrase technology. Any transcriptional errors that result from this process are unintentional.

## 2019-11-19 NOTE — Progress Notes (Signed)
I agree with the above plan 

## 2019-12-17 DIAGNOSIS — Z008 Encounter for other general examination: Secondary | ICD-10-CM | POA: Diagnosis not present

## 2019-12-17 DIAGNOSIS — Z9181 History of falling: Secondary | ICD-10-CM | POA: Diagnosis not present

## 2019-12-17 DIAGNOSIS — Z8673 Personal history of transient ischemic attack (TIA), and cerebral infarction without residual deficits: Secondary | ICD-10-CM | POA: Diagnosis not present

## 2019-12-17 DIAGNOSIS — H269 Unspecified cataract: Secondary | ICD-10-CM | POA: Diagnosis not present

## 2019-12-17 DIAGNOSIS — I10 Essential (primary) hypertension: Secondary | ICD-10-CM | POA: Diagnosis not present

## 2019-12-17 DIAGNOSIS — R32 Unspecified urinary incontinence: Secondary | ICD-10-CM | POA: Diagnosis not present

## 2019-12-27 DIAGNOSIS — I10 Essential (primary) hypertension: Secondary | ICD-10-CM | POA: Diagnosis not present

## 2019-12-27 DIAGNOSIS — E213 Hyperparathyroidism, unspecified: Secondary | ICD-10-CM | POA: Diagnosis not present

## 2019-12-27 DIAGNOSIS — E7800 Pure hypercholesterolemia, unspecified: Secondary | ICD-10-CM | POA: Diagnosis not present

## 2019-12-27 DIAGNOSIS — Z79899 Other long term (current) drug therapy: Secondary | ICD-10-CM | POA: Diagnosis not present

## 2019-12-27 DIAGNOSIS — Z1389 Encounter for screening for other disorder: Secondary | ICD-10-CM | POA: Diagnosis not present

## 2019-12-27 DIAGNOSIS — I700 Atherosclerosis of aorta: Secondary | ICD-10-CM | POA: Diagnosis not present

## 2019-12-27 DIAGNOSIS — Z23 Encounter for immunization: Secondary | ICD-10-CM | POA: Diagnosis not present

## 2019-12-27 DIAGNOSIS — Z0000 Encounter for general adult medical examination without abnormal findings: Secondary | ICD-10-CM | POA: Diagnosis not present

## 2019-12-27 DIAGNOSIS — E1169 Type 2 diabetes mellitus with other specified complication: Secondary | ICD-10-CM | POA: Diagnosis not present

## 2019-12-27 DIAGNOSIS — R1310 Dysphagia, unspecified: Secondary | ICD-10-CM | POA: Diagnosis not present

## 2019-12-27 DIAGNOSIS — M170 Bilateral primary osteoarthritis of knee: Secondary | ICD-10-CM | POA: Diagnosis not present

## 2020-01-02 ENCOUNTER — Other Ambulatory Visit: Payer: Self-pay

## 2020-01-02 ENCOUNTER — Encounter (HOSPITAL_COMMUNITY): Payer: Self-pay | Admitting: Emergency Medicine

## 2020-01-02 ENCOUNTER — Emergency Department (HOSPITAL_COMMUNITY): Payer: Medicare HMO

## 2020-01-02 ENCOUNTER — Emergency Department (HOSPITAL_COMMUNITY)
Admission: EM | Admit: 2020-01-02 | Discharge: 2020-01-02 | Disposition: A | Discharge status: Home / Self Care | Payer: Medicare HMO | Attending: Emergency Medicine | Admitting: Emergency Medicine

## 2020-01-02 DIAGNOSIS — I2510 Atherosclerotic heart disease of native coronary artery without angina pectoris: Secondary | ICD-10-CM | POA: Diagnosis not present

## 2020-01-02 DIAGNOSIS — G4489 Other headache syndrome: Secondary | ICD-10-CM | POA: Diagnosis not present

## 2020-01-02 DIAGNOSIS — Z7982 Long term (current) use of aspirin: Secondary | ICD-10-CM | POA: Insufficient documentation

## 2020-01-02 DIAGNOSIS — Z79899 Other long term (current) drug therapy: Secondary | ICD-10-CM | POA: Diagnosis not present

## 2020-01-02 DIAGNOSIS — Z8673 Personal history of transient ischemic attack (TIA), and cerebral infarction without residual deficits: Secondary | ICD-10-CM | POA: Insufficient documentation

## 2020-01-02 DIAGNOSIS — I10 Essential (primary) hypertension: Secondary | ICD-10-CM

## 2020-01-02 DIAGNOSIS — R519 Headache, unspecified: Secondary | ICD-10-CM | POA: Diagnosis not present

## 2020-01-02 DIAGNOSIS — R52 Pain, unspecified: Secondary | ICD-10-CM | POA: Diagnosis not present

## 2020-01-02 LAB — CBC WITH DIFFERENTIAL/PLATELET
Abs Immature Granulocytes: 0.03 10*3/uL (ref 0.00–0.07)
Basophils Absolute: 0 10*3/uL (ref 0.0–0.1)
Basophils Relative: 0 %
Eosinophils Absolute: 0.1 10*3/uL (ref 0.0–0.5)
Eosinophils Relative: 2 %
HCT: 50.5 % — ABNORMAL HIGH (ref 36.0–46.0)
Hemoglobin: 16.2 g/dL — ABNORMAL HIGH (ref 12.0–15.0)
Immature Granulocytes: 0 %
Lymphocytes Relative: 18 %
Lymphs Abs: 1.4 10*3/uL (ref 0.7–4.0)
MCH: 31.3 pg (ref 26.0–34.0)
MCHC: 32.1 g/dL (ref 30.0–36.0)
MCV: 97.5 fL (ref 80.0–100.0)
Monocytes Absolute: 0.9 10*3/uL (ref 0.1–1.0)
Monocytes Relative: 12 %
Neutro Abs: 5.1 10*3/uL (ref 1.7–7.7)
Neutrophils Relative %: 68 %
Platelets: 226 10*3/uL (ref 150–400)
RBC: 5.18 MIL/uL — ABNORMAL HIGH (ref 3.87–5.11)
RDW: 14.3 % (ref 11.5–15.5)
WBC: 7.5 10*3/uL (ref 4.0–10.5)
nRBC: 0 % (ref 0.0–0.2)

## 2020-01-02 LAB — COMPREHENSIVE METABOLIC PANEL
ALT: 16 U/L (ref 0–44)
AST: 33 U/L (ref 15–41)
Albumin: 3.8 g/dL (ref 3.5–5.0)
Alkaline Phosphatase: 58 U/L (ref 38–126)
Anion gap: 12 (ref 5–15)
BUN: 24 mg/dL — ABNORMAL HIGH (ref 8–23)
CO2: 25 mmol/L (ref 22–32)
Calcium: 9.4 mg/dL (ref 8.9–10.3)
Chloride: 100 mmol/L (ref 98–111)
Creatinine, Ser: 0.99 mg/dL (ref 0.44–1.00)
GFR calc Af Amer: 59 mL/min — ABNORMAL LOW (ref >60)
GFR calc non Af Amer: 51 mL/min — ABNORMAL LOW (ref >60)
Glucose, Bld: 96 mg/dL (ref 70–99)
Potassium: 5.1 mmol/L (ref 3.5–5.1)
Sodium: 137 mmol/L (ref 135–145)
Total Bilirubin: 0.6 mg/dL (ref 0.3–1.2)
Total Protein: 6.1 g/dL — ABNORMAL LOW (ref 6.5–8.1)

## 2020-01-02 LAB — TROPONIN I (HIGH SENSITIVITY)
Troponin I (High Sensitivity): 10 ng/L (ref <18)
Troponin I (High Sensitivity): 11 ng/L (ref <18)

## 2020-01-02 MED ORDER — METOCLOPRAMIDE HCL 5 MG/ML IJ SOLN
10.0000 mg | Freq: Once | INTRAMUSCULAR | Status: AC
  Administered 2020-01-02: 10 mg via INTRAVENOUS
  Filled 2020-01-02: qty 2

## 2020-01-02 MED ORDER — DIPHENHYDRAMINE HCL 50 MG/ML IJ SOLN
25.0000 mg | Freq: Once | INTRAMUSCULAR | Status: AC
  Administered 2020-01-02: 25 mg via INTRAVENOUS
  Filled 2020-01-02: qty 1

## 2020-01-02 MED ORDER — ONDANSETRON HCL 4 MG/2ML IJ SOLN
4.0000 mg | Freq: Once | INTRAMUSCULAR | Status: AC
  Administered 2020-01-02: 4 mg via INTRAVENOUS
  Filled 2020-01-02: qty 2

## 2020-01-02 MED ORDER — MORPHINE SULFATE (PF) 4 MG/ML IV SOLN
4.0000 mg | Freq: Once | INTRAVENOUS | Status: AC
  Administered 2020-01-02: 4 mg via INTRAVENOUS
  Filled 2020-01-02: qty 1

## 2020-01-02 NOTE — ED Provider Notes (Signed)
[place] DEPT Provider Note   CSN: [id number] Arrival date & time: 01/02/20  1848     History Chief Complaint  Patient presents with  . Hypertension    [name] is a 86 y.o. female hx of CAD, HTN, HL, here presenting with hypertension and headaches. Patient states that she is from independent living and she took a nap and woke up around 5 PM with headaches.  She states that the headache is frontal and she denies any weakness or numbness or trouble speaking.  She states that they took her blood pressure there and was 198/100.  She has a history of TIAs in the past so they sent her in to make sure she did not have a stroke.  Patient denies any chest pain or shortness of breath or abdominal pain.  She had a previous MRA that did not show any aneurysms.  The history is provided by the patient.       Past Medical History:  Diagnosis Date  . Arthritis   . CAD (coronary artery disease)    a. Mod prox LAD & first diagonal stenosis by cath 2002 - managed medically since that time with normal LV function.  . Cataract   . Hyperlipidemia   . Hypertension     Patient Active Problem List   Diagnosis Date Noted  . TIA (transient ischemic attack) 03/09/2019  . Pressure injury of skin 05/21/2018  . Incarcerated incisional hernia - reduced 05/19/2018  . SBO (small bowel obstruction) ([place]) 05/18/2018  . Bilateral primary osteoarthritis of knee 07/22/2017  . Hyperlipidemia 05/20/2014  . Hypotension 08/02/2013  . UTI (lower urinary tract infection) 08/02/2013  . Acute renal failure ([place]) 08/02/2013  . ARF (acute renal failure) ([place]) 08/02/2013  . Calculus of gallbladder with acute cholecystitis, without mention of obstruction 07/10/2013  . Acute cholecystitis 06/17/2013  . Chest pain 06/17/2013  . Pulmonary infiltrate in left lung on chest x-ray 06/17/2013  . Hypertension   . CAD (coronary artery disease)     Past Surgical History:  Procedure  Laterality Date  . ABDOMINAL HYSTERECTOMY    . APPENDECTOMY    . BACK SURGERY    . CARPAL TUNNEL RELEASE    . CHOLECYSTECTOMY N/A 06/19/2013   Procedure: LAPAROSCOPIC CHOLECYSTECTOMY;  Surgeon: Harl Bowie, MD;  Location: [facility];  Service: General;  Laterality: N/A;  . ESOPHAGOGASTRODUODENOSCOPY N/A 04/26/2017   Procedure: ESOPHAGOGASTRODUODENOSCOPY (EGD) W/ DILATION;  Surgeon: Ronnette Juniper, MD;  [facility];  Service: Gastroenterology;  Laterality: N/A;  . HERNIA REPAIR    . INSERTION OF MESH  05/19/2018   Procedure: INSERTION OF MESH;  Surgeon: Excell Seltzer, MD;  [facility];  Service: General;;  . IR GENERIC HISTORICAL  03/18/2016   IR RADIOLOGIST EVAL & MGMT 03/18/2016 Corrie Mckusick, DO [facility] INTERV RAD  . KNEE SURGERY    . SHOULDER SURGERY    . SPLENECTOMY, TOTAL    . VENTRAL HERNIA REPAIR N/A 05/19/2018   Procedure: HERNIA REPAIR VENTRAL ADULT;  Surgeon: Excell Seltzer, MD;  [facility];  Service: General;  Laterality: N/A;     OB History   No obstetric history on file.     Family History  Problem Relation Age of Onset  . Heart disease Mother        "enlarged valve"  . Cancer Mother        Ovarian  . Stroke Father   . Cancer Sister  Colon    Social History   Tobacco Use  . Smoking status: Never Smoker  . Smokeless tobacco: Never Used  Substance Use Topics  . Alcohol use: No  . Drug use: No    Home Medications Prior to Admission medications   Medication Sig Start Date End Date Taking? Authorizing Provider  acetaminophen (TYLENOL 8 HOUR ARTHRITIS PAIN) 650 MG CR tablet Take 650 mg by mouth 2 (two) times a day.     [provider]  aspirin EC 81 MG tablet Take 81 mg by mouth every morning.     [provider]  lisinopril (ZESTRIL) 5 MG tablet Take 5 mg by mouth daily. 03/29/19   [provider]  Multiple Vitamins-Minerals (PRESERVISION AREDS PO) Take 1 capsule by mouth every morning.    [provider]  Omega-3 Fatty Acids (FISH OIL PO) Take 1 capsule by mouth every morning.    [provider]  Polyethyl Glycol-Propyl Glycol (SYSTANE OP) Place 1 drop into both eyes 2 (two) times daily.     [provider]  traZODone (DESYREL) 50 MG tablet Take 25 mg by mouth at bedtime as needed for sleep.  09/28/18   [provider]    Allergies    Felodipine, Prednisone, Shrimp [shellfish allergy], Codeine, Crestor [rosuvastatin], Feldene [piroxicam], Lescol [fluvastatin], Lipitor [atorvastatin], Nexium [esomeprazole], Pravachol [pravastatin], Prilosec otc [omeprazole magnesium], Statins, Welchol [colesevelam], Zetia [ezetimibe], Zithromax [azithromycin], Zocor [simvastatin], and Omeprazole  Review of Systems   Review of Systems  Neurological: Positive for headaches.  All other systems reviewed and are negative.   Physical Exam Updated Vital Signs BP (!) 156/54   Pulse (!) 56   Temp 98.6 F (37 C) (Oral)   Resp 18   SpO2 96%   Physical Exam Vitals and nursing note reviewed.  Constitutional:      Appearance: Normal appearance.  HENT:     Head: Normocephalic.     Nose: Nose normal.     Mouth/Throat:     Mouth: Mucous membranes are moist.  Eyes:     Extraocular Movements: Extraocular movements intact.     Pupils: Pupils are equal, round, and reactive to light.  Cardiovascular:     Rate and Rhythm: Normal rate and regular rhythm.     Pulses: Normal pulses.     Heart sounds: Normal heart sounds.  Pulmonary:     Effort: Pulmonary effort is normal.     Breath sounds: Normal breath sounds.  Abdominal:     General: Abdomen is flat.     Palpations: Abdomen is soft.  Musculoskeletal:        General: Normal range of motion.     Cervical back: Normal range of motion.  Skin:    General: Skin is warm.     Capillary Refill: Capillary refill takes less than 2 seconds.  Neurological:     General: No focal deficit present.     Mental Status: She is  alert.     Comments: A & O x 2. CN 2- 12 intact, nl strength throughout. Nl sensation throughout. Nl finger to nose bilaterally   Psychiatric:        Mood and Affect: Mood normal.        Behavior: Behavior normal.     ED Results / Procedures / Treatments   Labs (all labs ordered are listed, but only abnormal results are displayed) Labs Reviewed  CBC WITH DIFFERENTIAL/PLATELET - Abnormal; Notable for the following components:  Result Value   RBC 5.18 (*)    Hemoglobin 16.2 (*)    HCT 50.5 (*)    All other components within normal limits  COMPREHENSIVE METABOLIC PANEL - Abnormal; Notable for the following components:   BUN 24 (*)    Total Protein 6.1 (*)    GFR calc non Af Amer 51 (*)    GFR calc Af Amer 59 (*)    All other components within normal limits  TROPONIN I (HIGH SENSITIVITY)  TROPONIN I (HIGH SENSITIVITY)    EKG EKG Interpretation  Date/Time:  Wednesday Jan 02 2020 19:45:41 EDT Ventricular Rate:  64 PR Interval:    QRS Duration: 99 QT Interval:  408 QTC Calculation: 421 R Axis:   113 Text Interpretation: Sinus rhythm Probable right ventricular hypertrophy Nonspecific T abnormalities, lateral leads No significant change since last tracing Confirmed by Wandra Arthurs [phone]) on 01/02/2020 7:52:21 PM   Radiology CT Head Wo Contrast  Result Date: 01/02/2020 CLINICAL DATA:  Headache, hypertension EXAM: CT HEAD WITHOUT CONTRAST TECHNIQUE: Contiguous axial images were obtained from the base of the skull through the vertex without intravenous contrast. COMPARISON:  03/09/2019 FINDINGS: Brain: No acute infarct or hemorrhage. Lateral ventricles and midline structures are unremarkable. No acute extra-axial fluid collections. No mass effect. Vascular: No hyperdense vessel or unexpected calcification. Skull: Normal. Negative for fracture or focal lesion. Sinuses/Orbits: No acute finding. Other: None. IMPRESSION: 1. No acute intracranial process. Electronically Signed   By:  Randa Ngo M.D.   On: 01/02/2020 20:44    Procedures Procedures (including critical care time)  Medications Ordered in ED Medications  metoCLOPramide (REGLAN) injection 10 mg (10 mg Intravenous Given 01/02/20 1948)  diphenhydrAMINE (BENADRYL) injection 25 mg (25 mg Intravenous Given 01/02/20 1941)  morphine 4 MG/ML injection 4 mg (4 mg Intravenous Given 01/02/20 2055)  ondansetron (ZOFRAN) injection 4 mg (4 mg Intravenous Given 01/02/20 2054)    ED Course  I have reviewed the triage vital signs and the nursing notes.  Pertinent labs & imaging results that were available during my care of the patient were reviewed by me and considered in my medical decision making (see chart for details).    MDM Rules/Calculators/A&P                      ALEAYAH THAM is a 86 y.o. female here presenting with headache and hypertension.  Patient has a history of TIA in the past and patient also has a history of hypertension and is on blood pressure medicine.  I think likely migraine with underlying hypertension.  She has no history of aneurysm and her headache is within 6 hours so if CT head shows no bleed, will not perform LP.  Will give migraine cocktail and get CBC, CMP, EKG, CT head.  Will reassess after migraine cocktail.  10:12 PM Labs unremarkable. CT head showed no bleed. BP down to 156/54 from 190s after headache is under control.  Stable for discharge.   Final Clinical Impression(s) / ED Diagnoses Final diagnoses:  None    Rx / DC Orders ED Discharge Orders    None       Drenda Freeze, MD 01/02/20 2214

## 2020-01-02 NOTE — ED Notes (Signed)
Family called to transport pt home.

## 2020-01-02 NOTE — Discharge Instructions (Signed)
Take tylenol for headaches   Continue taking your blood pressure medicine as prescribed.  See your doctor for follow-up . Return to ER if you have worsening headache, vomiting, weakness, trouble speaking.

## 2020-01-02 NOTE — ED Triage Notes (Signed)
Pt BIB EMS from independent living - ems reports pt took mid-day nap and woke up with a headache. Hx of HTN and TIA. Reports pt is compliant with lisinopril.   198/100 60 pulse 98% RA 20 RR

## 2020-01-04 DIAGNOSIS — M170 Bilateral primary osteoarthritis of knee: Secondary | ICD-10-CM | POA: Diagnosis not present

## 2020-01-04 DIAGNOSIS — M1711 Unilateral primary osteoarthritis, right knee: Secondary | ICD-10-CM | POA: Diagnosis not present

## 2020-01-04 DIAGNOSIS — M1712 Unilateral primary osteoarthritis, left knee: Secondary | ICD-10-CM | POA: Diagnosis not present

## 2020-02-27 DIAGNOSIS — R1310 Dysphagia, unspecified: Secondary | ICD-10-CM | POA: Diagnosis not present

## 2020-02-29 ENCOUNTER — Other Ambulatory Visit (HOSPITAL_COMMUNITY): Payer: Self-pay | Admitting: *Deleted

## 2020-02-29 DIAGNOSIS — R1310 Dysphagia, unspecified: Secondary | ICD-10-CM

## 2020-03-14 ENCOUNTER — Encounter (HOSPITAL_COMMUNITY): Payer: Medicare HMO

## 2020-03-14 ENCOUNTER — Ambulatory Visit (HOSPITAL_COMMUNITY): Payer: Medicare HMO

## 2020-03-14 ENCOUNTER — Encounter (HOSPITAL_COMMUNITY): Payer: Self-pay

## 2020-04-07 ENCOUNTER — Other Ambulatory Visit: Payer: Self-pay

## 2020-04-07 ENCOUNTER — Emergency Department (HOSPITAL_COMMUNITY): Payer: Medicare HMO

## 2020-04-07 ENCOUNTER — Inpatient Hospital Stay (HOSPITAL_COMMUNITY)
Admission: EM | Admit: 2020-04-07 | Discharge: 2020-04-11 | DRG: 064 | Disposition: A | Discharge status: Skilled Nursing Facility | Payer: Medicare HMO | Attending: Internal Medicine | Admitting: Internal Medicine

## 2020-04-07 DIAGNOSIS — G319 Degenerative disease of nervous system, unspecified: Secondary | ICD-10-CM | POA: Diagnosis not present

## 2020-04-07 DIAGNOSIS — Z7982 Long term (current) use of aspirin: Secondary | ICD-10-CM

## 2020-04-07 DIAGNOSIS — Z20822 Contact with and (suspected) exposure to covid-19: Secondary | ICD-10-CM | POA: Diagnosis present

## 2020-04-07 DIAGNOSIS — I6389 Other cerebral infarction: Secondary | ICD-10-CM | POA: Diagnosis not present

## 2020-04-07 DIAGNOSIS — I6782 Cerebral ischemia: Secondary | ICD-10-CM | POA: Diagnosis not present

## 2020-04-07 DIAGNOSIS — I674 Hypertensive encephalopathy: Secondary | ICD-10-CM

## 2020-04-07 DIAGNOSIS — Z8673 Personal history of transient ischemic attack (TIA), and cerebral infarction without residual deficits: Secondary | ICD-10-CM

## 2020-04-07 DIAGNOSIS — R29701 NIHSS score 1: Secondary | ICD-10-CM | POA: Diagnosis not present

## 2020-04-07 DIAGNOSIS — Z8249 Family history of ischemic heart disease and other diseases of the circulatory system: Secondary | ICD-10-CM

## 2020-04-07 DIAGNOSIS — I161 Hypertensive emergency: Secondary | ICD-10-CM | POA: Diagnosis present

## 2020-04-07 DIAGNOSIS — J9811 Atelectasis: Secondary | ICD-10-CM | POA: Diagnosis present

## 2020-04-07 DIAGNOSIS — M7120 Synovial cyst of popliteal space [Baker], unspecified knee: Secondary | ICD-10-CM | POA: Diagnosis present

## 2020-04-07 DIAGNOSIS — R4781 Slurred speech: Secondary | ICD-10-CM | POA: Diagnosis not present

## 2020-04-07 DIAGNOSIS — Z881 Allergy status to other antibiotic agents status: Secondary | ICD-10-CM

## 2020-04-07 DIAGNOSIS — Z888 Allergy status to other drugs, medicaments and biological substances status: Secondary | ICD-10-CM

## 2020-04-07 DIAGNOSIS — Z91013 Allergy to seafood: Secondary | ICD-10-CM

## 2020-04-07 DIAGNOSIS — R29725 NIHSS score 25: Secondary | ICD-10-CM | POA: Diagnosis not present

## 2020-04-07 DIAGNOSIS — R4701 Aphasia: Secondary | ICD-10-CM | POA: Diagnosis present

## 2020-04-07 DIAGNOSIS — Z79899 Other long term (current) drug therapy: Secondary | ICD-10-CM

## 2020-04-07 DIAGNOSIS — R9082 White matter disease, unspecified: Secondary | ICD-10-CM | POA: Diagnosis not present

## 2020-04-07 DIAGNOSIS — Z66 Do not resuscitate: Secondary | ICD-10-CM | POA: Diagnosis present

## 2020-04-07 DIAGNOSIS — E875 Hyperkalemia: Secondary | ICD-10-CM | POA: Diagnosis present

## 2020-04-07 DIAGNOSIS — R531 Weakness: Secondary | ICD-10-CM | POA: Diagnosis not present

## 2020-04-07 DIAGNOSIS — Z9081 Acquired absence of spleen: Secondary | ICD-10-CM

## 2020-04-07 DIAGNOSIS — R29810 Facial weakness: Secondary | ICD-10-CM | POA: Diagnosis not present

## 2020-04-07 DIAGNOSIS — Z90710 Acquired absence of both cervix and uterus: Secondary | ICD-10-CM

## 2020-04-07 DIAGNOSIS — I10 Essential (primary) hypertension: Secondary | ICD-10-CM | POA: Diagnosis present

## 2020-04-07 DIAGNOSIS — Z885 Allergy status to narcotic agent status: Secondary | ICD-10-CM

## 2020-04-07 DIAGNOSIS — N390 Urinary tract infection, site not specified: Secondary | ICD-10-CM | POA: Diagnosis present

## 2020-04-07 DIAGNOSIS — I248 Other forms of acute ischemic heart disease: Secondary | ICD-10-CM | POA: Diagnosis present

## 2020-04-07 DIAGNOSIS — G8190 Hemiplegia, unspecified affecting unspecified side: Secondary | ICD-10-CM | POA: Diagnosis present

## 2020-04-07 DIAGNOSIS — I2510 Atherosclerotic heart disease of native coronary artery without angina pectoris: Secondary | ICD-10-CM | POA: Diagnosis present

## 2020-04-07 DIAGNOSIS — I130 Hypertensive heart and chronic kidney disease with heart failure and stage 1 through stage 4 chronic kidney disease, or unspecified chronic kidney disease: Secondary | ICD-10-CM | POA: Diagnosis present

## 2020-04-07 DIAGNOSIS — M1990 Unspecified osteoarthritis, unspecified site: Secondary | ICD-10-CM | POA: Diagnosis present

## 2020-04-07 DIAGNOSIS — N1831 Chronic kidney disease, stage 3a: Secondary | ICD-10-CM

## 2020-04-07 DIAGNOSIS — I5033 Acute on chronic diastolic (congestive) heart failure: Secondary | ICD-10-CM | POA: Diagnosis present

## 2020-04-07 DIAGNOSIS — M546 Pain in thoracic spine: Secondary | ICD-10-CM | POA: Diagnosis present

## 2020-04-07 DIAGNOSIS — R471 Dysarthria and anarthria: Secondary | ICD-10-CM | POA: Diagnosis not present

## 2020-04-07 DIAGNOSIS — D696 Thrombocytopenia, unspecified: Secondary | ICD-10-CM

## 2020-04-07 DIAGNOSIS — M549 Dorsalgia, unspecified: Secondary | ICD-10-CM

## 2020-04-07 DIAGNOSIS — H9190 Unspecified hearing loss, unspecified ear: Secondary | ICD-10-CM | POA: Diagnosis present

## 2020-04-07 DIAGNOSIS — Z823 Family history of stroke: Secondary | ICD-10-CM

## 2020-04-07 DIAGNOSIS — R29700 NIHSS score 0: Secondary | ICD-10-CM | POA: Diagnosis present

## 2020-04-07 DIAGNOSIS — I639 Cerebral infarction, unspecified: Secondary | ICD-10-CM | POA: Diagnosis not present

## 2020-04-07 DIAGNOSIS — I082 Rheumatic disorders of both aortic and tricuspid valves: Secondary | ICD-10-CM | POA: Diagnosis present

## 2020-04-07 DIAGNOSIS — I2489 Other forms of acute ischemic heart disease: Secondary | ICD-10-CM

## 2020-04-07 DIAGNOSIS — E785 Hyperlipidemia, unspecified: Secondary | ICD-10-CM

## 2020-04-07 DIAGNOSIS — L89322 Pressure ulcer of left buttock, stage 2: Secondary | ICD-10-CM | POA: Diagnosis present

## 2020-04-07 DIAGNOSIS — I63412 Cerebral infarction due to embolism of left middle cerebral artery: Principal | ICD-10-CM | POA: Diagnosis present

## 2020-04-07 LAB — CBC
HCT: 43.4 % (ref 36.0–46.0)
Hemoglobin: 14.1 g/dL (ref 12.0–15.0)
MCH: 31.3 pg (ref 26.0–34.0)
MCHC: 32.5 g/dL (ref 30.0–36.0)
MCV: 96.4 fL (ref 80.0–100.0)
Platelets: 126 10*3/uL — ABNORMAL LOW (ref 150–400)
RBC: 4.5 MIL/uL (ref 3.87–5.11)
RDW: 15 % (ref 11.5–15.5)
WBC: 6.7 10*3/uL (ref 4.0–10.5)
nRBC: 0 % (ref 0.0–0.2)

## 2020-04-07 LAB — I-STAT CHEM 8, ED
BUN: 51 mg/dL — ABNORMAL HIGH (ref 8–23)
Calcium, Ion: 1.12 mmol/L — ABNORMAL LOW (ref 1.15–1.40)
Chloride: 105 mmol/L (ref 98–111)
Creatinine, Ser: 0.9 mg/dL (ref 0.44–1.00)
Glucose, Bld: 90 mg/dL (ref 70–99)
HCT: 43 % (ref 36.0–46.0)
Hemoglobin: 14.6 g/dL (ref 12.0–15.0)
Potassium: 7.6 mmol/L (ref 3.5–5.1)
Sodium: 135 mmol/L (ref 135–145)
TCO2: 28 mmol/L (ref 22–32)

## 2020-04-07 LAB — COMPREHENSIVE METABOLIC PANEL
ALT: 13 U/L (ref 0–44)
AST: 60 U/L — ABNORMAL HIGH (ref 15–41)
Albumin: 3.5 g/dL (ref 3.5–5.0)
Alkaline Phosphatase: 59 U/L (ref 38–126)
Anion gap: 10 (ref 5–15)
BUN: 30 mg/dL — ABNORMAL HIGH (ref 8–23)
CO2: 24 mmol/L (ref 22–32)
Calcium: 9.5 mg/dL (ref 8.9–10.3)
Chloride: 103 mmol/L (ref 98–111)
Creatinine, Ser: 0.94 mg/dL (ref 0.44–1.00)
GFR calc Af Amer: >60 mL/min (ref >60)
GFR calc non Af Amer: 54 mL/min — ABNORMAL LOW (ref >60)
Glucose, Bld: 92 mg/dL (ref 70–99)
Potassium: >7.5 mmol/L (ref 3.5–5.1)
Sodium: 137 mmol/L (ref 135–145)
Total Bilirubin: 2.2 mg/dL — ABNORMAL HIGH (ref 0.3–1.2)
Total Protein: 5.5 g/dL — ABNORMAL LOW (ref 6.5–8.1)

## 2020-04-07 LAB — PROTIME-INR
INR: 1 (ref 0.8–1.2)
Prothrombin Time: 12.6 seconds (ref 11.4–15.2)

## 2020-04-07 LAB — DIFFERENTIAL
Abs Immature Granulocytes: 0.02 10*3/uL (ref 0.00–0.07)
Basophils Absolute: 0 10*3/uL (ref 0.0–0.1)
Basophils Relative: 0 %
Eosinophils Absolute: 0.1 10*3/uL (ref 0.0–0.5)
Eosinophils Relative: 2 %
Immature Granulocytes: 0 %
Lymphocytes Relative: 24 %
Lymphs Abs: 1.6 10*3/uL (ref 0.7–4.0)
Monocytes Absolute: 0.9 10*3/uL (ref 0.1–1.0)
Monocytes Relative: 13 %
Neutro Abs: 4.1 10*3/uL (ref 1.7–7.7)
Neutrophils Relative %: 61 %

## 2020-04-07 LAB — POTASSIUM: Potassium: 4.2 mmol/L (ref 3.5–5.1)

## 2020-04-07 LAB — ETHANOL: Alcohol, Ethyl (B): <10 mg/dL (ref <10)

## 2020-04-07 LAB — SARS CORONAVIRUS 2 BY RT PCR (HOSPITAL ORDER, PERFORMED IN ~~LOC~~ HOSPITAL LAB): SARS Coronavirus 2: NEGATIVE

## 2020-04-07 LAB — APTT: aPTT: 33 seconds (ref 24–36)

## 2020-04-07 MED ORDER — HYDRALAZINE HCL 20 MG/ML IJ SOLN
20.0000 mg | Freq: Once | INTRAMUSCULAR | Status: AC
  Administered 2020-04-08: 20 mg via INTRAVENOUS
  Filled 2020-04-07: qty 1

## 2020-04-07 NOTE — ED Notes (Signed)
Patient transported to MRI 

## 2020-04-07 NOTE — ED Notes (Signed)
CHEM 8  K is 7.6 This was report to the RN.

## 2020-04-07 NOTE — ED Provider Notes (Signed)
[facility] EMERGENCY DEPARTMENT Provider Note   CSN: [id number] Arrival date & time: 04/07/20  1920     History Chief Complaint  Patient presents with  . Aphasia    [name] is a 86 y.o. female.  HPI Patient lives independently and has good baseline function with self-care.  Her son has spoken with her at 12:30 PM and patient was at normal baseline.  He reports then at 17:45 he spoke on the phone again and her speech was noticeably slurred.  At that time EMS was contacted.  On arrival, patient does not have any acute complaints.  She does not note that her speech is any longer slurred or any focal weakness numbness or tingling.  She has no headache or pain.  EMS noted on their exam that with longer sentences they could appreciate some slurring.  She was accurate and repeating single words.  Patient was alert and oriented x4 without difficulty.  No focal motor deficits were appreciated.  Blood pressure elevated at 229/103.    Past Medical History:  Diagnosis Date  . Arthritis   . CAD (coronary artery disease)    a. Mod prox LAD & first diagonal stenosis by cath 2002 - managed medically since that time with normal LV function.  . Cataract   . Hyperlipidemia   . Hypertension     Patient Active Problem List   Diagnosis Date Noted  . TIA (transient ischemic attack) 03/09/2019  . Pressure injury of skin 05/21/2018  . Incarcerated incisional hernia - reduced 05/19/2018  . SBO (small bowel obstruction) ([place]) 05/18/2018  . Bilateral primary osteoarthritis of knee 07/22/2017  . Hyperlipidemia 05/20/2014  . Hypotension 08/02/2013  . UTI (lower urinary tract infection) 08/02/2013  . Acute renal failure ([place]) 08/02/2013  . ARF (acute renal failure) ([place]) 08/02/2013  . Calculus of gallbladder with acute cholecystitis, without mention of obstruction 07/10/2013  . Acute cholecystitis 06/17/2013  . Chest pain 06/17/2013  . Pulmonary infiltrate in left lung on  chest x-ray 06/17/2013  . Hypertension   . CAD (coronary artery disease)     Past Surgical History:  Procedure Laterality Date  . ABDOMINAL HYSTERECTOMY    . APPENDECTOMY    . BACK SURGERY    . CARPAL TUNNEL RELEASE    . CHOLECYSTECTOMY N/A 06/19/2013   Procedure: LAPAROSCOPIC CHOLECYSTECTOMY;  Surgeon: Harl Bowie, MD;  Location: [facility];  Service: General;  Laterality: N/A;  . ESOPHAGOGASTRODUODENOSCOPY N/A 04/26/2017   Procedure: ESOPHAGOGASTRODUODENOSCOPY (EGD) W/ DILATION;  Surgeon: Ronnette Juniper, MD;  Location: [facility];  Service: Gastroenterology;  Laterality: N/A;  . HERNIA REPAIR    . INSERTION OF MESH  05/19/2018   Procedure: INSERTION OF MESH;  Surgeon: Excell Seltzer, MD;  Location: [facility];  Service: General;;  . IR GENERIC HISTORICAL  03/18/2016   IR RADIOLOGIST EVAL & MGMT 03/18/2016 Corrie Mckusick, DO [facility] INTERV RAD  . KNEE SURGERY    . SHOULDER SURGERY    . SPLENECTOMY, TOTAL    . VENTRAL HERNIA REPAIR N/A 05/19/2018   Procedure: HERNIA REPAIR VENTRAL ADULT;  Surgeon: Excell Seltzer, MD;  Location: [facility];  Service: General;  Laterality: N/A;     OB History   No obstetric history on file.     Family History  Problem Relation Age of Onset  . Heart disease Mother        "enlarged valve"  . Cancer Mother        Ovarian  . Stroke  Father   . Cancer Sister        Colon    Social History   Tobacco Use  . Smoking status: Never Smoker  . Smokeless tobacco: Never Used  Vaping Use  . Vaping Use: Never used  Substance Use Topics  . Alcohol use: No  . Drug use: No    Home Medications Prior to Admission medications   Medication Sig Start Date End Date Taking? Authorizing Provider  acetaminophen (TYLENOL 8 HOUR ARTHRITIS PAIN) 650 MG CR tablet Take 650 mg by mouth 2 (two) times a day.     [provider]  aspirin EC 81 MG tablet Take 81 mg by mouth every morning.     [provider]  lisinopril (ZESTRIL) 5 MG tablet Take 5 mg by  mouth daily. 03/29/19   [provider]  Multiple Vitamins-Minerals (PRESERVISION AREDS PO) Take 1 capsule by mouth every morning.    [provider]  Omega-3 Fatty Acids (FISH OIL PO) Take 1 capsule by mouth every morning.    [provider]  Polyethyl Glycol-Propyl Glycol (SYSTANE OP) Place 1 drop into both eyes 2 (two) times daily.     [provider]  traZODone (DESYREL) 50 MG tablet Take 25 mg by mouth at bedtime as needed for sleep.  09/28/18   [provider]    Allergies    Felodipine, Prednisone, Shrimp [shellfish allergy], Codeine, Crestor [rosuvastatin], Feldene [piroxicam], Lescol [fluvastatin], Lipitor [atorvastatin], Nexium [esomeprazole], Pravachol [pravastatin], Prilosec otc [omeprazole magnesium], Statins, Welchol [colesevelam], Zetia [ezetimibe], Zithromax [azithromycin], Zocor [simvastatin], and Omeprazole  Review of Systems   Review of Systems 10 systems reviewed and negative except as per HPI Physical Exam Updated Vital Signs BP (!) 231/70   Pulse (!) 58   Temp 98.1 F (36.7 C) (Oral)   Resp 17   Ht 5\' 3"  (1.6 m)   Wt 67.9 kg   SpO2 100%   BMI 26.52 kg/m   Physical Exam Constitutional:      Appearance: Normal appearance.  HENT:     Head: Normocephalic and atraumatic.     Mouth/Throat:     Mouth: Mucous membranes are moist.     Pharynx: Oropharynx is clear.  Eyes:     Extraocular Movements: Extraocular movements intact.     Pupils: Pupils are equal, round, and reactive to light.  Cardiovascular:     Rate and Rhythm: Normal rate.  Pulmonary:     Effort: Pulmonary effort is normal.     Breath sounds: Normal breath sounds.  Abdominal:     General: There is no distension.     Palpations: Abdomen is soft.     Tenderness: There is no abdominal tenderness. There is no guarding.  Musculoskeletal:        General: No swelling or tenderness. Normal range of motion.     Cervical back: Neck supple.     Right lower leg:  No edema.     Left lower leg: No edema.  Skin:    General: Skin is warm and dry.  Neurological:     General: No focal deficit present.     Mental Status: She is alert and oriented to person, place, and time.     Cranial Nerves: No cranial nerve deficit.     Sensory: No sensory deficit.     Motor: No weakness.     Coordination: Coordination normal.  Psychiatric:        Mood and Affect: Mood normal.  ED Results / Procedures / Treatments   Labs (all labs ordered are listed, but only abnormal results are displayed) Labs Reviewed  CBC - Abnormal; Notable for the following components:      Result Value   Platelets 126 (*)    All other components within normal limits  COMPREHENSIVE METABOLIC PANEL - Abnormal; Notable for the following components:   Potassium >7.5 (*)    BUN 30 (*)    Total Protein 5.5 (*)    AST 60 (*)    Total Bilirubin 2.2 (*)    GFR calc non Af Amer 54 (*)    All other components within normal limits  I-STAT CHEM 8, ED - Abnormal; Notable for the following components:   Potassium 7.6 (*)    BUN 51 (*)    Calcium, Ion 1.12 (*)    All other components within normal limits  SARS CORONAVIRUS 2 BY RT PCR (HOSPITAL ORDER, [facility])  ETHANOL  PROTIME-INR  APTT  DIFFERENTIAL  POTASSIUM  RAPID URINE DRUG SCREEN, HOSP PERFORMED  URINALYSIS, ROUTINE W REFLEX MICROSCOPIC    EKG EKG Interpretation  Date/Time:  Monday April 07 2020 19:34:27 EDT Ventricular Rate:  60 PR Interval:    QRS Duration: 101 QT Interval:  417 QTC Calculation: 417 R Axis:   101 Text Interpretation: Sinus rhythm Atrial premature complexes Probable right ventricular hypertrophy no sig change from previos Confirmed by Charlesetta Shanks [id number]) on 04/07/2020 8:29:46 PM   Radiology CT HEAD WO CONTRAST  Result Date: 04/07/2020 CLINICAL DATA:  Acute neuro deficit.  Slurred speech EXAM: CT HEAD WITHOUT CONTRAST TECHNIQUE: Contiguous axial images were obtained  from the base of the skull through the vertex without intravenous contrast. COMPARISON:  CT head 01/02/2020 FINDINGS: Brain: Mild cortical atrophy. Negative for hydrocephalus. Chronic infarct left thalamus unchanged. Mild chronic ischemic changes in the white matter. Negative for acute infarct, hemorrhage, mass Vascular: Negative for hyperdense vessel Skull: Negative Sinuses/Orbits: Paranasal sinuses clear. Bilateral cataract extraction. Other: None IMPRESSION: No acute abnormality no change from the prior study. Electronically Signed   By: Franchot Gallo M.D.   On: 04/07/2020 20:30   MR BRAIN WO CONTRAST  Result Date: 04/07/2020 CLINICAL DATA:  Initial evaluation for neuro deficit, stroke suspected. EXAM: MRI HEAD WITHOUT CONTRAST TECHNIQUE: Multiplanar, multiecho pulse sequences of the brain and surrounding structures were obtained without intravenous contrast. COMPARISON:  Prior CT from earlier the same day. FINDINGS: Brain: Generalized age-related cerebral atrophy. Minimal T2/FLAIR hyperintensity within the periventricular deep white matter both cerebral hemispheres most consistent with chronic small vessel ischemic disease, minor in appearance, and felt to be within normal limits for age. Small remote lacunar infarct noted at the left thalamus. 6 mm focus of diffusion abnormality involving the cortical gray matter of the inferior left frontal lobe suspicious for a small acute ischemic infarct (series 5, image 76). No associated hemorrhage or mass effect. No other diffusion abnormality to suggest acute or subacute ischemia. Gray-white matter differentiation otherwise maintained. No evidence for acute or chronic intracranial hemorrhage. No mass lesion, midline shift or mass effect. No hydrocephalus or extra-axial fluid collection. Pituitary gland suprasellar region normal. Midline structures intact. Vascular: Major intracranial vascular flow voids are maintained. Skull and upper cervical spine: Craniocervical  junction within normal limits. Bone marrow signal intensity normal. No scalp soft tissue abnormality. Sinuses/Orbits: Patient status post bilateral ocular lens replacement. Paranasal sinuses are largely clear. No significant mastoid effusion. Inner ear structures grossly normal. Other: None. IMPRESSION:  1. 6 mm acute ischemic nonhemorrhagic cortical infarct involving the inferior left frontal lobe. 2. No other acute intracranial abnormality. 3. Underlying age-related cerebral atrophy, with remote left thalamic lacunar infarct. Electronically Signed   By: Jeannine Boga M.D.   On: 04/07/2020 23:30    Procedures Procedures (including critical care time) CRITICAL CARE Performed by: Charlesetta Shanks   Total critical care time: 30  minutes  Critical care time was exclusive of separately billable procedures and treating other patients.  Critical care was necessary to treat or prevent imminent or life-threatening deterioration.  Critical care was time spent personally by me on the following activities: development of treatment plan with patient and/or surrogate as well as nursing, discussions with consultants, evaluation of patient's response to treatment, examination of patient, obtaining history from patient or surrogate, ordering and performing treatments and interventions, ordering and review of laboratory studies, ordering and review of radiographic studies, pulse oximetry and re-evaluation of patient's condition. Medications Ordered in ED Medications - No data to display  ED Course  I have reviewed the triage vital signs and the nursing notes.  Pertinent labs & imaging results that were available during my care of the patient were reviewed by me and considered in my medical decision making (see chart for details).    MDM Rules/Calculators/A&P                          Consult: Dr. Malen Gauze neurology.  We will proceed with MRI for rule out acute stroke versus TIA versus hypertensive  encephalopathy.  Patient developed symptoms of slurred speech and word finding difficulty.  Patient does have history of TIA.  At this time, her mental status is clear.  Speech is clear and neurologic exam is nonfocal.  Patient is independent and active 86 years of age.  At this time, MRI is pending.  Patient is blood pressures are hypertensive.  She initially arrived with blood pressure greater than 825 systolic.  These have trended down to 160s but have trended back up again to 220s.  Patient will require admission for hypertensive encephalopathy.  Awaiting MRI results to initiate antihypertensive therapy.  No signs of infectious etiology.  Patient has not had significant headache.  Patient son reports that chest recently she has started to mention pressure at her temples.  He denies she has been complaining of headache previous to this.  At this time, MRI does show acute stroke.  Will plan for permissive hypertension.  Plan for admission.  Final Clinical Impression(s) / ED Diagnoses Final diagnoses:  Dysarthria  Hypertensive encephalopathy  Cerebrovascular accident (CVA), unspecified mechanism ([place])    Rx / [place] Orders ED Discharge Orders    None       Charlesetta Shanks, MD 04/07/20 2339

## 2020-04-07 NOTE — ED Triage Notes (Signed)
Pt arrived via [facility] from home. Pt's son noted at 1745 that pt had slurred speech. Pt's son reports pt was at her baseline at 1230. EMS reports intermittent dysarthria and aphasia. Pt is A&Ox4. No other stroke like s/s noted by EMS. Pt has hx of HTN and last BP was 229/103.

## 2020-04-07 NOTE — ED Notes (Signed)
Pfieffer MD at bedside examining pt

## 2020-04-08 ENCOUNTER — Inpatient Hospital Stay (HOSPITAL_COMMUNITY): Payer: Medicare HMO

## 2020-04-08 ENCOUNTER — Other Ambulatory Visit: Payer: Self-pay | Admitting: Cardiology

## 2020-04-08 ENCOUNTER — Encounter (HOSPITAL_COMMUNITY): Payer: Self-pay | Admitting: Family Medicine

## 2020-04-08 DIAGNOSIS — G8190 Hemiplegia, unspecified affecting unspecified side: Secondary | ICD-10-CM | POA: Diagnosis present

## 2020-04-08 DIAGNOSIS — H9190 Unspecified hearing loss, unspecified ear: Secondary | ICD-10-CM | POA: Diagnosis present

## 2020-04-08 DIAGNOSIS — E7800 Pure hypercholesterolemia, unspecified: Secondary | ICD-10-CM

## 2020-04-08 DIAGNOSIS — I517 Cardiomegaly: Secondary | ICD-10-CM | POA: Diagnosis not present

## 2020-04-08 DIAGNOSIS — I2510 Atherosclerotic heart disease of native coronary artery without angina pectoris: Secondary | ICD-10-CM | POA: Diagnosis present

## 2020-04-08 DIAGNOSIS — K449 Diaphragmatic hernia without obstruction or gangrene: Secondary | ICD-10-CM | POA: Diagnosis not present

## 2020-04-08 DIAGNOSIS — I63412 Cerebral infarction due to embolism of left middle cerebral artery: Secondary | ICD-10-CM | POA: Diagnosis not present

## 2020-04-08 DIAGNOSIS — E785 Hyperlipidemia, unspecified: Secondary | ICD-10-CM | POA: Diagnosis not present

## 2020-04-08 DIAGNOSIS — R4781 Slurred speech: Secondary | ICD-10-CM | POA: Diagnosis not present

## 2020-04-08 DIAGNOSIS — I25119 Atherosclerotic heart disease of native coronary artery with unspecified angina pectoris: Secondary | ICD-10-CM | POA: Diagnosis not present

## 2020-04-08 DIAGNOSIS — I639 Cerebral infarction, unspecified: Secondary | ICD-10-CM

## 2020-04-08 DIAGNOSIS — R29701 NIHSS score 1: Secondary | ICD-10-CM | POA: Diagnosis not present

## 2020-04-08 DIAGNOSIS — L89322 Pressure ulcer of left buttock, stage 2: Secondary | ICD-10-CM | POA: Diagnosis present

## 2020-04-08 DIAGNOSIS — I161 Hypertensive emergency: Secondary | ICD-10-CM | POA: Diagnosis present

## 2020-04-08 DIAGNOSIS — N1831 Chronic kidney disease, stage 3a: Secondary | ICD-10-CM | POA: Diagnosis not present

## 2020-04-08 DIAGNOSIS — I5033 Acute on chronic diastolic (congestive) heart failure: Secondary | ICD-10-CM | POA: Diagnosis not present

## 2020-04-08 DIAGNOSIS — Z7401 Bed confinement status: Secondary | ICD-10-CM | POA: Diagnosis not present

## 2020-04-08 DIAGNOSIS — R4701 Aphasia: Secondary | ICD-10-CM | POA: Diagnosis present

## 2020-04-08 DIAGNOSIS — Z20822 Contact with and (suspected) exposure to covid-19: Secondary | ICD-10-CM | POA: Diagnosis not present

## 2020-04-08 DIAGNOSIS — J9811 Atelectasis: Secondary | ICD-10-CM | POA: Diagnosis not present

## 2020-04-08 DIAGNOSIS — M549 Dorsalgia, unspecified: Secondary | ICD-10-CM

## 2020-04-08 DIAGNOSIS — R29700 NIHSS score 0: Secondary | ICD-10-CM | POA: Diagnosis not present

## 2020-04-08 DIAGNOSIS — M2550 Pain in unspecified joint: Secondary | ICD-10-CM | POA: Diagnosis not present

## 2020-04-08 DIAGNOSIS — Z66 Do not resuscitate: Secondary | ICD-10-CM | POA: Diagnosis not present

## 2020-04-08 DIAGNOSIS — Z8673 Personal history of transient ischemic attack (TIA), and cerebral infarction without residual deficits: Secondary | ICD-10-CM

## 2020-04-08 DIAGNOSIS — D696 Thrombocytopenia, unspecified: Secondary | ICD-10-CM

## 2020-04-08 DIAGNOSIS — I082 Rheumatic disorders of both aortic and tricuspid valves: Secondary | ICD-10-CM | POA: Diagnosis present

## 2020-04-08 DIAGNOSIS — M546 Pain in thoracic spine: Secondary | ICD-10-CM | POA: Diagnosis present

## 2020-04-08 DIAGNOSIS — M7120 Synovial cyst of popliteal space [Baker], unspecified knee: Secondary | ICD-10-CM | POA: Diagnosis present

## 2020-04-08 DIAGNOSIS — I6389 Other cerebral infarction: Secondary | ICD-10-CM | POA: Diagnosis not present

## 2020-04-08 DIAGNOSIS — I63512 Cerebral infarction due to unspecified occlusion or stenosis of left middle cerebral artery: Secondary | ICD-10-CM | POA: Diagnosis not present

## 2020-04-08 DIAGNOSIS — G459 Transient cerebral ischemic attack, unspecified: Secondary | ICD-10-CM | POA: Diagnosis not present

## 2020-04-08 DIAGNOSIS — I10 Essential (primary) hypertension: Secondary | ICD-10-CM | POA: Diagnosis not present

## 2020-04-08 DIAGNOSIS — N390 Urinary tract infection, site not specified: Secondary | ICD-10-CM | POA: Diagnosis not present

## 2020-04-08 DIAGNOSIS — R29725 NIHSS score 25: Secondary | ICD-10-CM | POA: Diagnosis not present

## 2020-04-08 DIAGNOSIS — I248 Other forms of acute ischemic heart disease: Secondary | ICD-10-CM | POA: Diagnosis not present

## 2020-04-08 DIAGNOSIS — I130 Hypertensive heart and chronic kidney disease with heart failure and stage 1 through stage 4 chronic kidney disease, or unspecified chronic kidney disease: Secondary | ICD-10-CM | POA: Diagnosis not present

## 2020-04-08 DIAGNOSIS — R21 Rash and other nonspecific skin eruption: Secondary | ICD-10-CM | POA: Diagnosis not present

## 2020-04-08 LAB — RAPID URINE DRUG SCREEN, HOSP PERFORMED
Amphetamines: NOT DETECTED
Barbiturates: NOT DETECTED
Benzodiazepines: NOT DETECTED
Cocaine: NOT DETECTED
Opiates: NOT DETECTED
Tetrahydrocannabinol: NOT DETECTED

## 2020-04-08 LAB — LIPID PANEL
Cholesterol: 226 mg/dL — ABNORMAL HIGH (ref 0–200)
HDL: 65 mg/dL (ref >40)
LDL Cholesterol: 144 mg/dL — ABNORMAL HIGH (ref 0–99)
Total CHOL/HDL Ratio: 3.5 RATIO
Triglycerides: 83 mg/dL (ref <150)
VLDL: 17 mg/dL (ref 0–40)

## 2020-04-08 LAB — TROPONIN I (HIGH SENSITIVITY)
Troponin I (High Sensitivity): 12 ng/L (ref <18)
Troponin I (High Sensitivity): 15 ng/L (ref <18)
Troponin I (High Sensitivity): 99 ng/L — ABNORMAL HIGH (ref <18)
Troponin I (High Sensitivity): 156 ng/L (ref <18)
Troponin I (High Sensitivity): 219 ng/L (ref <18)
Troponin I (High Sensitivity): 234 ng/L (ref <18)
Troponin I (High Sensitivity): 247 ng/L (ref <18)

## 2020-04-08 LAB — URINALYSIS, ROUTINE W REFLEX MICROSCOPIC
Bilirubin Urine: NEGATIVE
Glucose, UA: NEGATIVE mg/dL
Hgb urine dipstick: NEGATIVE
Ketones, ur: 5 mg/dL — AB
Nitrite: POSITIVE — AB
Protein, ur: NEGATIVE mg/dL
Specific Gravity, Urine: 1.026 (ref 1.005–1.030)
pH: 7 (ref 5.0–8.0)

## 2020-04-08 LAB — CBC
HCT: 41.6 % (ref 36.0–46.0)
Hemoglobin: 13.9 g/dL (ref 12.0–15.0)
MCH: 31.8 pg (ref 26.0–34.0)
MCHC: 33.4 g/dL (ref 30.0–36.0)
MCV: 95.2 fL (ref 80.0–100.0)
Platelets: 194 10*3/uL (ref 150–400)
RBC: 4.37 MIL/uL (ref 3.87–5.11)
RDW: 14.7 % (ref 11.5–15.5)
WBC: 8.9 10*3/uL (ref 4.0–10.5)
nRBC: 0 % (ref 0.0–0.2)

## 2020-04-08 LAB — COMPREHENSIVE METABOLIC PANEL
ALT: 20 U/L (ref 0–44)
AST: 25 U/L (ref 15–41)
Albumin: 3.3 g/dL — ABNORMAL LOW (ref 3.5–5.0)
Alkaline Phosphatase: 55 U/L (ref 38–126)
Anion gap: 10 (ref 5–15)
BUN: 22 mg/dL (ref 8–23)
CO2: 25 mmol/L (ref 22–32)
Calcium: 9.5 mg/dL (ref 8.9–10.3)
Chloride: 104 mmol/L (ref 98–111)
Creatinine, Ser: 0.83 mg/dL (ref 0.44–1.00)
GFR calc Af Amer: >60 mL/min (ref >60)
GFR calc non Af Amer: >60 mL/min (ref >60)
Glucose, Bld: 136 mg/dL — ABNORMAL HIGH (ref 70–99)
Potassium: 3.9 mmol/L (ref 3.5–5.1)
Sodium: 139 mmol/L (ref 135–145)
Total Bilirubin: 0.8 mg/dL (ref 0.3–1.2)
Total Protein: 5.4 g/dL — ABNORMAL LOW (ref 6.5–8.1)

## 2020-04-08 LAB — ECHOCARDIOGRAM COMPLETE
Area-P 1/2: 2.6 cm2
Height: 63 in
S' Lateral: 1.9 cm
Weight: 2261.04 oz

## 2020-04-08 LAB — HEMOGLOBIN A1C
Hgb A1c MFr Bld: 6.2 % — ABNORMAL HIGH (ref 4.8–5.6)
Mean Plasma Glucose: 131 mg/dL

## 2020-04-08 LAB — GLUCOSE, CAPILLARY: Glucose-Capillary: 136 mg/dL — ABNORMAL HIGH (ref 70–99)

## 2020-04-08 MED ORDER — ONDANSETRON HCL 4 MG/2ML IJ SOLN
4.0000 mg | Freq: Four times a day (QID) | INTRAMUSCULAR | Status: DC | PRN
  Administered 2020-04-08 (×2): 4 mg via INTRAVENOUS
  Filled 2020-04-08 (×2): qty 2

## 2020-04-08 MED ORDER — TRAZODONE HCL 50 MG PO TABS
25.0000 mg | ORAL_TABLET | Freq: Every evening | ORAL | Status: DC | PRN
  Administered 2020-04-10: 25 mg via ORAL
  Filled 2020-04-08: qty 1

## 2020-04-08 MED ORDER — FUROSEMIDE 10 MG/ML IJ SOLN
20.0000 mg | Freq: Once | INTRAMUSCULAR | Status: AC
  Administered 2020-04-08: 20 mg via INTRAVENOUS
  Filled 2020-04-08: qty 4

## 2020-04-08 MED ORDER — ONDANSETRON HCL 4 MG/2ML IJ SOLN: 4.0000 mg | Freq: Three times a day (TID) | INTRAMUSCULAR | Status: DC | PRN

## 2020-04-08 MED ORDER — STROKE: EARLY STAGES OF RECOVERY BOOK
Freq: Once | Status: AC
  Filled 2020-04-08: qty 1

## 2020-04-08 MED ORDER — ASPIRIN EC 81 MG PO TBEC
81.0000 mg | DELAYED_RELEASE_TABLET | Freq: Every day | ORAL | Status: DC
  Administered 2020-04-08 – 2020-04-11 (×4): 81 mg via ORAL
  Filled 2020-04-08 (×4): qty 1

## 2020-04-08 MED ORDER — SODIUM CHLORIDE 0.9 % IV SOLN
1.0000 g | INTRAVENOUS | Status: DC
  Administered 2020-04-08 – 2020-04-09 (×2): 1 g via INTRAVENOUS
  Filled 2020-04-08 (×2): qty 10

## 2020-04-08 MED ORDER — SENNOSIDES-DOCUSATE SODIUM 8.6-50 MG PO TABS: 1.0000 | ORAL_TABLET | Freq: Every evening | ORAL | Status: DC | PRN

## 2020-04-08 MED ORDER — ENOXAPARIN SODIUM 40 MG/0.4ML ~~LOC~~ SOLN
40.0000 mg | SUBCUTANEOUS | Status: DC
  Administered 2020-04-08 – 2020-04-09 (×2): 40 mg via SUBCUTANEOUS
  Filled 2020-04-08 (×2): qty 0.4

## 2020-04-08 MED ORDER — OMEGA-3-ACID ETHYL ESTERS 1 G PO CAPS
1.0000 g | ORAL_CAPSULE | Freq: Every day | ORAL | Status: DC
  Administered 2020-04-08 – 2020-04-11 (×4): 1 g via ORAL
  Filled 2020-04-08 (×4): qty 1

## 2020-04-08 MED ORDER — HYDROMORPHONE HCL 1 MG/ML IJ SOLN
0.5000 mg | Freq: Once | INTRAMUSCULAR | Status: AC
  Administered 2020-04-08: 0.5 mg via INTRAVENOUS
  Filled 2020-04-08: qty 1

## 2020-04-08 MED ORDER — CLOPIDOGREL BISULFATE 75 MG PO TABS
75.0000 mg | ORAL_TABLET | Freq: Every day | ORAL | Status: DC
  Administered 2020-04-08 – 2020-04-11 (×4): 75 mg via ORAL
  Filled 2020-04-08 (×4): qty 1

## 2020-04-08 MED ORDER — ACETAMINOPHEN 325 MG PO TABS
650.0000 mg | ORAL_TABLET | Freq: Three times a day (TID) | ORAL | Status: DC | PRN
  Administered 2020-04-08 – 2020-04-11 (×6): 650 mg via ORAL
  Filled 2020-04-08 (×7): qty 2

## 2020-04-08 MED ORDER — SODIUM CHLORIDE 0.9 % IV SOLN: INTRAVENOUS | Status: DC

## 2020-04-08 MED ORDER — LABETALOL HCL 5 MG/ML IV SOLN
10.0000 mg | INTRAVENOUS | Status: DC | PRN
  Administered 2020-04-09: 10 mg via INTRAVENOUS
  Filled 2020-04-08: qty 4

## 2020-04-08 MED ORDER — SODIUM CHLORIDE 0.9 % IV SOLN
INTRAVENOUS | Status: DC | PRN
  Administered 2020-04-08 – 2020-04-09 (×2): 250 mL via INTRAVENOUS

## 2020-04-08 MED ORDER — SODIUM CHLORIDE 0.9 % IV SOLN
100.0000 mg | Freq: Two times a day (BID) | INTRAVENOUS | Status: DC
  Administered 2020-04-08 – 2020-04-09 (×3): 100 mg via INTRAVENOUS
  Filled 2020-04-08 (×6): qty 100

## 2020-04-08 MED ORDER — IOHEXOL 350 MG/ML SOLN
80.0000 mL | Freq: Once | INTRAVENOUS | Status: AC | PRN
  Administered 2020-04-08: 80 mL via INTRAVENOUS

## 2020-04-08 NOTE — ED Notes (Signed)
RN did not pass pt on swallow screen at this time. Pt not following RN commands and stating she wants to sleep.

## 2020-04-08 NOTE — ED Notes (Signed)
RN attempted preformed Modified NIHSS, pt not wanting to follow commands and continuing to tell RN " Put me down and let me sleep".

## 2020-04-08 NOTE — Evaluation (Signed)
Occupational Therapy Evaluation Patient Details Name: [name] MRN: [id number] DOB: 1929-05-16 Today's Date: 04/08/2020    History of Present Illness Patient is a 86 y/o female with PMH of HTN, OA, hearing loss, cataract, CAD,  presenting with slurred speech. 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    Clinical Impression   PTA patient independent with ADLs, mobility using rollator for mobility, completing IADLs (med mgmt, cleaning, 1 meal a day). Admitted for above and limited by problem list below, including aphasia, generalized weakness, impaired balance, and decreased activity tolerance. Patient HOH and aphasia limiting cognitive assessment, but patient able to follow simple commands with increased time, continue assessment.  Patient requires mod assist for toilet transfers, total assist +2 for toileting, and min-max assist for UB/LB ADLs.  She will benefit from continued OT services while admitted and after dc at CIR level to optimize independence and safety to prior to dc home to ILF. Will follow acutely.     Follow Up Recommendations  CIR;Supervision/Assistance - 24 hour    Equipment Recommendations  Other (comment) (TBD at next venue of care )    Recommendations for Other Services       Precautions / Restrictions Precautions Precautions: Fall Restrictions Weight Bearing Restrictions: No      Mobility Bed Mobility               General bed mobility comments: OOB in recliner upon entry   Transfers Overall transfer level: Needs assistance   Transfers: Sit to/from Stand;Stand Pivot Transfers Sit to Stand: Mod assist Stand pivot transfers: Mod assist;+2 safety/equipment       General transfer comment: mod assist to power up from [facility], face to face pivot transfer to recliner with mod assist (RN present for safety)    Balance Overall balance assessment: Needs assistance Sitting-balance support: No upper extremity  supported;Feet supported Sitting balance-Leahy Scale: Fair     Standing balance support: Bilateral upper extremity supported;During functional activity Standing balance-Leahy Scale: Poor Standing balance comment: relies on external support                           ADL either performed or assessed with clinical judgement   ADL Overall ADL's : Needs assistance/impaired     Grooming: Minimal assistance;Sitting;Wash/dry face;Brushing hair Grooming Details (indicate cue type and reason): min assist to comb hair, increased time to follow simple commands; anticipate increased assist with oral care Upper Body Bathing: Minimal assistance;Sitting   Lower Body Bathing: Maximal assistance;Sit to/from stand   Upper Body Dressing : Minimal assistance;Sitting   Lower Body Dressing: Total assistance;Sit to/from stand   Toilet Transfer: Moderate assistance;Stand-pivot;BSC Toilet Transfer Details (indicate cue type and reason): on BSC upon entry, mod assist to pivot to recliner  Toileting- Clothing Manipulation and Hygiene: Total assistance;+2 for physical assistance;+2 for safety/equipment;Sit to/from stand Toileting - Water quality scientist Details (indicate cue type and reason): RN completing hygiene with therapist support to stand      Functional mobility during ADLs: Moderate assistance;Cueing for safety;Cueing for sequencing General ADL Comments: pt limited by weakness, activity tolerance, commuincation, and cognition      Vision   Additional Comments: further assessment required     Perception     Praxis      Pertinent Vitals/Pain Pain Assessment: Faces Faces Pain Scale: No hurt     Hand Dominance Right   Extremity/Trunk Assessment Upper Extremity Assessment Upper Extremity Assessment:  Generalized weakness   Lower Extremity Assessment Lower Extremity Assessment: Defer to PT evaluation       Communication Communication Communication: Expressive  difficulties;HOH   Cognition Arousal/Alertness: Awake/alert (but fatigued ) Behavior During Therapy: WFL for tasks assessed/performed Overall Cognitive Status: Impaired/Different from baseline Area of Impairment: Following commands                       Following Commands: Follows one step commands inconsistently       General Comments: pt following simple commands inconsistently, but limited by aphaisa and HOH    General Comments  son present and supportive     Exercises     Shoulder Instructions      Home Living Family/patient expects to be discharged to:: Private residence Living Arrangements: Alone Available Help at Discharge: Family;Available PRN/intermittently Type of Home: Independent living facility Home Access: Level entry     Home Layout: One level     Bathroom Shower/Tub: Occupational psychologist: Standard     Home Equipment: Environmental consultant - 4 wheels;Walker - 2 wheels;Bedside commode   Additional Comments: Lives at [facility]       Prior Functioning/Environment Level of Independence: Independent with assistive device(s)        Comments: Pt's son reports pt was very independent and cooked 1 meal/day and performed ADLs. Used rollator or RW for mobility. 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         OT Problem List: Decreased strength;Decreased activity tolerance;Impaired balance (sitting and/or standing);Decreased cognition;Decreased safety awareness;Decreased knowledge of use of DME or AE;Decreased knowledge of precautions      OT Treatment/Interventions: Self-care/ADL training;Therapeutic exercise;DME and/or AE instruction;Therapeutic activities;Cognitive remediation/compensation;Patient/family education;Balance training    OT Goals(Current goals can be found in the care plan section) Acute Rehab OT Goals Patient Stated Goal: unable to state  OT Goal Formulation: Patient unable to participate in goal setting Time For Goal  Achievement: 04/22/20 Potential to Achieve Goals: Good  OT Frequency: Min 2X/week   Barriers to D/C:            Co-evaluation              AM-PAC OT "6 Clicks" Daily Activity     Outcome Measure Help from another person eating meals?: A Little Help from another person taking care of personal grooming?: A Little Help from another person toileting, which includes using toliet, bedpan, or urinal?: Total Help from another person bathing (including washing, rinsing, drying)?: A Lot Help from another person to put on and taking off regular upper body clothing?: A Little Help from another person to put on and taking off regular lower body clothing?: A Lot 6 Click Score: 14   End of Session Nurse Communication: Mobility status  Activity Tolerance: Patient tolerated treatment well Patient left: in chair;with call bell/phone within reach;with nursing/sitter in room  OT Visit Diagnosis: Other abnormalities of gait and mobility (R26.89);Muscle weakness (generalized) (M62.81);Cognitive communication deficit (R41.841) Symptoms and signs involving cognitive functions: Cerebral infarction                Time: 0539-7673 OT Time Calculation (min): 16 min Charges:  OT General Charges $OT Visit: 1 Visit OT Evaluation $OT Eval Moderate Complexity: 1 Mod  Jolaine Artist, OT Acute Rehabilitation Services Pager [phone] Office [phone]   Delight Stare 04/08/2020, 3:00 PM

## 2020-04-08 NOTE — Evaluation (Signed)
Clinical/Bedside Swallow Evaluation Patient Details  Name: [name] MRN: [id number] Date of Birth: [birth year]  Today's Date: 04/08/2020 Time: SLP Start Time (ACUTE ONLY): 0913 SLP Stop Time (ACUTE ONLY): 0928 SLP Time Calculation (min) (ACUTE ONLY): 15 min  Past Medical History:  Past Medical History:  Diagnosis Date  . Arthritis   . CAD (coronary artery disease)    a. Mod prox LAD & first diagonal stenosis by cath 2002 - managed medically since that time with normal LV function.  . Cataract   . Hyperlipidemia   . Hypertension    Past Surgical History:  Past Surgical History:  Procedure Laterality Date  . ABDOMINAL HYSTERECTOMY    . APPENDECTOMY    . BACK SURGERY    . CARPAL TUNNEL RELEASE    . CHOLECYSTECTOMY N/A 06/19/2013   Procedure: LAPAROSCOPIC CHOLECYSTECTOMY;  Surgeon: Harl Bowie, MD;  Location: [facility];  Service: General;  Laterality: N/A;  . ESOPHAGOGASTRODUODENOSCOPY N/A 04/26/2017   Procedure: ESOPHAGOGASTRODUODENOSCOPY (EGD) W/ DILATION;  Surgeon: Ronnette Juniper, MD;  Location: [facility];  Service: Gastroenterology;  Laterality: N/A;  . HERNIA REPAIR    . INSERTION OF MESH  05/19/2018   Procedure: INSERTION OF MESH;  Surgeon: Excell Seltzer, MD;  Location: [facility];  Service: General;;  . IR GENERIC HISTORICAL  03/18/2016   IR RADIOLOGIST EVAL & MGMT 03/18/2016 Corrie Mckusick, DO [facility] INTERV RAD  . KNEE SURGERY    . SHOULDER SURGERY    . SPLENECTOMY, TOTAL    . VENTRAL HERNIA REPAIR N/A 05/19/2018   Procedure: HERNIA REPAIR VENTRAL ADULT;  Surgeon: Excell Seltzer, MD;  Location: [facility];  Service: General;  Laterality: N/A;   HPI:   86 y.o. female with medical history significant for hypertension, CAD, hyperlipidemia, osteoarthritis presents to the hospital for evaluation of slurred speech.  She lives independently alone and has good normal function with self-care and can perform ADLs. Found to have acute ischemic nonhemorrhagic cortical infarct involving  the   Assessment / Plan / Recommendation Clinical Impression  Minimal oral dysphagia exhibited s/p left frontal CVA. Pt with hx of esophageal dysphagia per family report including repeat esophageal dilations, followed by GI as outpatient. Per son, pt was planned for upcoming repeat dilation in the next few months. Reduced labial seal and intermittent trace right anterior spillage without consistent awareness exhibited with POs. No overt pocketing noted with trials with SLP. No overt s/sx of aspiration with any PO. Recommend regular diet, thin liquids, standard aspiration precautions. SLP to follow up for diet tolerance. Pt with novel aphasic deficits, see full speech language evaluation report for further details.   SLP Visit Diagnosis: Dysphagia, oral phase (R13.11)    Aspiration Risk  Mild aspiration risk    Diet Recommendation     Medication Administration: Whole meds with liquid    Other  Recommendations Oral Care Recommendations: Oral care BID   Follow up Recommendations 24 hour supervision/assistance      Frequency and Duration min 1 x/week  1 week       Prognosis Prognosis for Safe Diet Advancement: Good Barriers to Reach Goals: Language deficits;Time post onset      Swallow Study   General Date of Onset: 04/08/20 HPI:  86 y.o. female with medical history significant for hypertension, CAD, hyperlipidemia, osteoarthritis presents to the hospital for evaluation of slurred speech.  She lives independently alone and has good normal function with self-care and can perform ADLs. Found to have acute ischemic nonhemorrhagic cortical infarct involving  the Type of Study: Bedside Swallow Evaluation Previous Swallow Assessment: none on file Diet Prior to this Study: NPO Temperature Spikes Noted: No Respiratory Status: Room air History of Recent Intubation: No Behavior/Cognition: Alert;Requires cueing Oral Care Completed by SLP: No Oral Cavity - Dentition: Dentures,  bottom;Dentures, top Vision: Impaired for self-feeding Self-Feeding Abilities: Needs assist Patient Positioning: Upright in bed Baseline Vocal Quality: Low vocal intensity;Other (comment) (expressive aphasia deficits appreciated ) Volitional Cough: Cognitively unable to elicit Volitional Swallow: Unable to elicit    Oral/Motor/Sensory Function Overall Oral Motor/Sensory Function: Mild impairment Facial ROM: Reduced right Facial Symmetry: Within Functional Limits Facial Sensation: Reduced right;Suspected CN V (Trigeminal) dysfunction Lingual ROM: Reduced right   Ice Chips Ice chips: Not tested   Thin Liquid Thin Liquid: Impaired Presentation: Cup;Straw Oral Phase Impairments: Reduced labial seal Oral Phase Functional Implications: Prolonged oral transit Pharyngeal  Phase Impairments: Suspected delayed Swallow;Multiple swallows    Nectar Thick Nectar Thick Liquid: Not tested   Honey Thick Honey Thick Liquid: Not tested   Puree Puree: Impaired Presentation: Self Fed Oral Phase Impairments: Reduced labial seal Oral Phase Functional Implications: Prolonged oral transit;Right anterior spillage Pharyngeal Phase Impairments: Multiple swallows;Suspected delayed Swallow   Solid     Solid: Impaired Presentation: Self Fed Oral Phase Impairments: Reduced labial seal Oral Phase Functional Implications: Right anterior spillage Pharyngeal Phase Impairments: Suspected delayed Swallow;Decreased hyoid-laryngeal movement      Chelsea E Hartness MA, CCC-SLP Acute Rehabilitation Services 04/08/2020,10:12 AM

## 2020-04-08 NOTE — Evaluation (Signed)
Speech Language Pathology Evaluation Patient Details Name: BRIGGETTE NAJARIAN MRN: [id number] DOB: 02-May-1929 Today's Date: 04/08/2020 Time: 0928-1000 SLP Time Calculation (min) (ACUTE ONLY): 32 min  Problem List:  Patient Active Problem List   Diagnosis Date Noted  . Upper back pain 04/08/2020  . Thrombocytopenia ([place]) 04/08/2020  . Acute CVA (cerebrovascular accident) ([place]) 04/07/2020  . TIA (transient ischemic attack) 03/09/2019  . Pressure injury of skin 05/21/2018  . Incarcerated incisional hernia - reduced 05/19/2018  . SBO (small bowel obstruction) ([place]) 05/18/2018  . Bilateral primary osteoarthritis of knee 07/22/2017  . Hyperlipidemia 05/20/2014  . Hypotension 08/02/2013  . UTI (lower urinary tract infection) 08/02/2013  . Acute renal failure ([place]) 08/02/2013  . ARF (acute renal failure) ([place]) 08/02/2013  . Calculus of gallbladder with acute cholecystitis, without mention of obstruction 07/10/2013  . Acute cholecystitis 06/17/2013  . Chest pain 06/17/2013  . Pulmonary infiltrate in left lung on chest x-ray 06/17/2013  . Hypertension   . CAD (coronary artery disease)    Past Medical History:  Past Medical History:  Diagnosis Date  . Arthritis   . CAD (coronary artery disease)    a. Mod prox LAD & first diagonal stenosis by cath 2002 - managed medically since that time with normal LV function.  . Cataract   . Hyperlipidemia   . Hypertension    Past Surgical History:  Past Surgical History:  Procedure Laterality Date  . ABDOMINAL HYSTERECTOMY    . APPENDECTOMY    . BACK SURGERY    . CARPAL TUNNEL RELEASE    . CHOLECYSTECTOMY N/A 06/19/2013   Procedure: LAPAROSCOPIC CHOLECYSTECTOMY;  Surgeon: Harl Bowie, MD;  Location: [facility];  Service: General;  Laterality: N/A;  . ESOPHAGOGASTRODUODENOSCOPY N/A 04/26/2017   Procedure: ESOPHAGOGASTRODUODENOSCOPY (EGD) W/ DILATION;  Surgeon: Ronnette Juniper, MD;  [facility];  Service: Gastroenterology;  Laterality: N/A;   . HERNIA REPAIR    . INSERTION OF MESH  05/19/2018   Procedure: INSERTION OF MESH;  Surgeon: Excell Seltzer, MD;  Location[facility];  Service: General;;  . IR GENERIC HISTORICAL  03/18/2016   IR RADIOLOGIST EVAL & MGMT 03/18/2016 Corrie Mckusick, DO [facility] INTERV RAD  . KNEE SURGERY    . SHOULDER SURGERY    . SPLENECTOMY, TOTAL    . VENTRAL HERNIA REPAIR N/A 05/19/2018   Procedure: HERNIA REPAIR VENTRAL ADULT;  Surgeon: Excell Seltzer, MD;  Location: [facility];  Service: General;  Laterality: N/A;   HPI:   86 y.o. female with medical history significant for hypertension, CAD, hyperlipidemia, osteoarthritis presents to the hospital for evaluation of slurred speech.  She lives independently alone and has good normal function with self-care and can perform ADLs. Found to have acute ischemic nonhemorrhagic cortical infarct involving the   Assessment / Plan / Recommendation Clinical Impression  Novel deficits in aphasia appreciated s/p CVA. Suspect novel cogntive deficits, however this is difficulty to assess given severity of pts aphasia. Expressive deficits apparent with limited verbal output to the word level and some familar phrases. Neologisms, jargon speech, and phonemic paraphasia exhibited. Motor speech deficits included reduced breath support, impairing vocal intensity, further reducing communication intelligibility. Pt son at bedside who assisted greatly with PLOF. Pt is a [facility] at [facility], was previously on indepedent living side. He states the pt was independent with most  ADLs at baseline, though recently made the decision to not drive. Pt very HOH at baseline, difficult to assess if deficits exhibited in receptive language due to severity of  hearing impairments, pt does not have hearing aids (refused per son report). Pt appeared to have left gaze preference during interactions though would turn head to right visual field with cues. Continued ST intervention indicated to assist linguistic  and supsected cogntive deficits.       SLP Assessment  SLP Recommendation/Assessment: Patient needs continued Speech Lanaguage Pathology Services SLP Visit Diagnosis: Aphasia (R47.01)    Follow Up Recommendations  24 hour supervision/assistance    Frequency and Duration min 1 x/week  1 week      SLP Evaluation Cognition  Overall Cognitive Status: Impaired/Different from baseline Arousal/Alertness: Awake/alert Orientation Level: Other (comment) (significant expressive aphasia, limited verbal output)       Comprehension  Auditory Comprehension Overall Auditory Comprehension: Other (comment) (difficult to assess pt significantly HOH; novel aphasia )    Expression Expression Primary Mode of Expression: Nonverbal - gestures Verbal Expression Overall Verbal Expression: Impaired Initiation: Impaired Automatic Speech: Name Level of Generative/Spontaneous Verbalization: Word Repetition: Impaired Naming: Impairment Responsive: 0-25% accurate Confrontation: Impaired Convergent: 0-24% accurate Verbal Errors: Jargon;Neologisms;Language of confusion Pragmatics: Impairment Impairments: Eye contact;Monotone;Abnormal affect Effective Techniques: Semantic cues Non-Verbal Means of Communication: Gestures Written Expression Dominant Hand: Right   Oral / Motor  Oral Motor/Sensory Function Overall Oral Motor/Sensory Function: Mild impairment Facial ROM: Reduced right Facial Symmetry: Within Functional Limits Facial Sensation: Reduced right;Suspected CN V (Trigeminal) dysfunction Lingual ROM: Reduced right Motor Speech Overall Motor Speech: Impaired Respiration: Impaired Level of Impairment: Word Resonance: Within functional limits Intelligibility: Intelligibility reduced Word: 0-24% accurate Motor Planning: Witnin functional limits Effective Techniques: Slow rate;Increased vocal intensity;Over-articulate   GO                    Makyiah Lie E Tamu Golz MA, CCC-SLP\ Acute  Rehabilitation Services  04/08/2020, 10:31 AM

## 2020-04-08 NOTE — Progress Notes (Signed)
  Echocardiogram 2D Echocardiogram with 3D has been performed.  [name] M 04/08/2020, 4:01 PM

## 2020-04-08 NOTE — Progress Notes (Signed)
STROKE TEAM PROGRESS NOTE   INTERVAL HISTORY Son at the bedside. Pt sitting in chair, eating lunch. However, she had severe hard of hearing, not able to answer questions. However, with spontaneous speech, she has expressive aphasia at least with word salad. Moving all extremities equally, no facial droop.   Discussed with son at bedside, patient has severe hard of hearing but has no hearing aid due to financial reason.  However, she normally lives independently in the nursing facility and able to speak well.  She walks with walker, but no need for major daily activity.  Yesterday 6 PM, son called patient and found she has garbled speech, which has again seen today.  In discussing with loop recorder versus 30-day cardiac event monitoring, patient not able to participate discussion due to hard of hearing.  According to son, patient is old-fashioned, conservative, likely not able to accept implantable loop recorder especially with difficulty understanding the rationale with the skin cutting.  He felt that 30-day Cardiac event monitoring most likely the way to go.  Vitals:   04/08/20 0200 04/08/20 0257 04/08/20 0507 04/08/20 0720  BP: (!) 198/86 (!) 183/78 (!) 147/130 (!) 150/52  Pulse: 85 95 79 70  Resp: 17 17 18 18   Temp:  98.7 F (37.1 C) 98.7 F (37.1 C) 99.1 F (37.3 C)  TempSrc:  Oral Oral Oral  SpO2: 95% 99% 94% 94%  Weight:  64.1 kg    Height:  5\' 3"  (1.6 m)     CBC:  Recent Labs  Lab 04/07/20 2009 04/07/20 2009 04/07/20 2026 04/08/20 0741  WBC 6.7  --   --  8.9  NEUTROABS 4.1  --   --   --   HGB 14.1   < > 14.6 13.9  HCT 43.4   < > 43.0 41.6  MCV 96.4  --   --  95.2  PLT 126*  --   --  194   < > = values in this interval not displayed.   Basic Metabolic Panel:  Recent Labs  Lab 04/07/20 2009 04/07/20 2009 04/07/20 2026 04/07/20 2205  NA 137  --  135  --   K >7.5*   < > 7.6* 4.2  CL 103  --  105  --   CO2 24  --   --   --   GLUCOSE 92  --  90  --   BUN 30*  --  51*   --   CREATININE 0.94  --  0.90  --   CALCIUM 9.5  --   --   --    < > = values in this interval not displayed.   Lipid Panel:  Recent Labs  Lab 04/08/20 0135  CHOL 226*  TRIG 83  HDL 65  CHOLHDL 3.5  VLDL 17  LDLCALC 144*   HgbA1c: No results for input(s): HGBA1C in the last 168 hours.   Urine Drug Screen:  Recent Labs  Lab 04/08/20 0420  LABOPIA NONE DETECTED  COCAINSCRNUR NONE DETECTED  LABBENZ NONE DETECTED  AMPHETMU NONE DETECTED  THCU NONE DETECTED  LABBARB NONE DETECTED    Alcohol Level  Recent Labs  Lab 04/07/20 2009  ETH <10    IMAGING past 24 hours CT ANGIO HEAD W OR WO CONTRAST  Result Date: 04/08/2020 CLINICAL DATA:  Follow-up examination for acute stroke. EXAM: CT ANGIOGRAPHY HEAD AND NECK TECHNIQUE: Multidetector CT imaging of the head and neck was performed using the standard protocol during bolus administration of  intravenous contrast. Multiplanar CT image reconstructions and MIPs were obtained to evaluate the vascular anatomy. Carotid stenosis measurements (when applicable) are obtained utilizing NASCET criteria, using the distal internal carotid diameter as the denominator. CONTRAST:  68mL OMNIPAQUE IOHEXOL 350 MG/ML SOLN COMPARISON:  Prior studies from 04/07/2020 FINDINGS: CTA NECK FINDINGS Aortic arch: Visualized aortic arch of normal caliber with normal 3 vessel morphology. Mild atheromatous change about the arch and origin of the great vessels without hemodynamically significant stenosis. Right carotid system: Right CCA widely patent from its origin to the bifurcation without stenosis. Mild atheromatous change about the right bifurcation without significant stenosis. Right ICA mildly tortuous and medialized into the retropharyngeal space but is widely patent to the skull base without stenosis, dissection or occlusion. Left carotid system: Left CCA patent from its origin to the bifurcation without flow-limiting stenosis. Mild atheromatous change about the  origin of the left ICA without significant stenosis. Left ICA mildly tortuous and medialized into the retropharyngeal space but is widely patent to the skull base without stenosis, dissection or occlusion. Vertebral arteries: Both vertebral arteries arise from the subclavian arteries. Right vertebral artery dominant. Vertebral arteries widely patent without stenosis, dissection or occlusion. Skeleton: No definite acute osseous abnormality. Bones are somewhat diffusely sclerotic in appearance without discrete lytic or blastic osseous lesion Other neck: No other acute soft tissue abnormality within the neck. Few scattered thyroid nodules noted, largest of which measures 7 mm on the right. Findings felt to be of doubtful significance given size and patient age. No follow-up imaging recommended regarding these lesions. No other mass lesion or adenopathy. Upper chest: Mild scattered interlobular septal thickening noted within the visualized lungs, suggesting underlying mild pulmonary interstitial congestion. Mild irregular biapical pleuroparenchymal thickening/scarring noted. Visualized upper chest demonstrates no other acute finding. Review of the MIP images confirms the above findings CTA HEAD FINDINGS Anterior circulation: Petrous segments patent bilaterally. Mild scattered atheromatous change within the cavernous/supraclinoid ICAs without hemodynamically significant stenosis. ICA termini well perfused. A1 segments patent bilaterally. Normal anterior communicating artery complex. Anterior cerebral arteries patent to their distal aspects without stenosis. No M1 stenosis or occlusion. Normal MCA bifurcations. On the left, there is a proximal left M3 occlusion, inferior division, in keeping with the previously identified small left MCA territory infarct (series 7, image 95). Otherwise, MCA branches well perfused and symmetric. Posterior circulation: Mild focal non stenotic plaque noted within the proximal right V4  segment. Vertebral arteries otherwise widely patent without stenosis. Both picas patent. Basilar patent to its distal aspect without stenosis. Superior cerebral arteries patent bilaterally. Left PCA supplied via the basilar. Fetal type origin of the right PCA. Both PCAs well perfused to their distal aspects without stenosis. Venous sinuses: Grossly patent allowing for timing the contrast bolus. Anatomic variants: Fetal type origin of the right PCA. No intracranial aneurysm. Review of the MIP images confirms the above findings IMPRESSION: 1. Acute proximal left M3 occlusion, inferior division, in keeping with the previously identified small left MCA territory infarct. 2. Mild atheromatous change about the carotid bifurcations and carotid siphons without hemodynamically significant or correctable stenosis. 3. Mild diffuse interlobular septal thickening within the visualized lungs, suggesting mild pulmonary interstitial congestion/edema. Electronically Signed   By: Jeannine Boga M.D.   On: 04/08/2020 01:57   DG Chest 2 View  Result Date: 04/08/2020 CLINICAL DATA:  86 year old female with slurred speech. EXAM: CHEST - 2 VIEW COMPARISON:  Chest radiograph dated 03/09/2019. FINDINGS: There is cardiomegaly with mild vascular congestion. Left lung base density  may represent atelectasis or infiltrate. The right lung is clear. No pleural effusion or pneumothorax. There is a moderate size hiatal hernia. No acute osseous pathology. IMPRESSION: 1. Cardiomegaly with mild vascular congestion. 2. Left lung base atelectasis versus infiltrate. 3. Moderate size hiatal hernia. Electronically Signed   By: Anner Crete M.D.   On: 04/08/2020 01:10   CT HEAD WO CONTRAST  Result Date: 04/07/2020 CLINICAL DATA:  Acute neuro deficit.  Slurred speech EXAM: CT HEAD WITHOUT CONTRAST TECHNIQUE: Contiguous axial images were obtained from the base of the skull through the vertex without intravenous contrast. COMPARISON:  CT head  01/02/2020 FINDINGS: Brain: Mild cortical atrophy. Negative for hydrocephalus. Chronic infarct left thalamus unchanged. Mild chronic ischemic changes in the white matter. Negative for acute infarct, hemorrhage, mass Vascular: Negative for hyperdense vessel Skull: Negative Sinuses/Orbits: Paranasal sinuses clear. Bilateral cataract extraction. Other: None IMPRESSION: No acute abnormality no change from the prior study. Electronically Signed   By: Franchot Gallo M.D.   On: 04/07/2020 20:30   CT ANGIO NECK W OR WO CONTRAST  Result Date: 04/08/2020 CLINICAL DATA:  Follow-up examination for acute stroke. EXAM: CT ANGIOGRAPHY HEAD AND NECK TECHNIQUE: Multidetector CT imaging of the head and neck was performed using the standard protocol during bolus administration of intravenous contrast. Multiplanar CT image reconstructions and MIPs were obtained to evaluate the vascular anatomy. Carotid stenosis measurements (when applicable) are obtained utilizing NASCET criteria, using the distal internal carotid diameter as the denominator. CONTRAST:  75mL OMNIPAQUE IOHEXOL 350 MG/ML SOLN COMPARISON:  Prior studies from 04/07/2020 FINDINGS: CTA NECK FINDINGS Aortic arch: Visualized aortic arch of normal caliber with normal 3 vessel morphology. Mild atheromatous change about the arch and origin of the great vessels without hemodynamically significant stenosis. Right carotid system: Right CCA widely patent from its origin to the bifurcation without stenosis. Mild atheromatous change about the right bifurcation without significant stenosis. Right ICA mildly tortuous and medialized into the retropharyngeal space but is widely patent to the skull base without stenosis, dissection or occlusion. Left carotid system: Left CCA patent from its origin to the bifurcation without flow-limiting stenosis. Mild atheromatous change about the origin of the left ICA without significant stenosis. Left ICA mildly tortuous and medialized into the  retropharyngeal space but is widely patent to the skull base without stenosis, dissection or occlusion. Vertebral arteries: Both vertebral arteries arise from the subclavian arteries. Right vertebral artery dominant. Vertebral arteries widely patent without stenosis, dissection or occlusion. Skeleton: No definite acute osseous abnormality. Bones are somewhat diffusely sclerotic in appearance without discrete lytic or blastic osseous lesion Other neck: No other acute soft tissue abnormality within the neck. Few scattered thyroid nodules noted, largest of which measures 7 mm on the right. Findings felt to be of doubtful significance given size and patient age. No follow-up imaging recommended regarding these lesions. No other mass lesion or adenopathy. Upper chest: Mild scattered interlobular septal thickening noted within the visualized lungs, suggesting underlying mild pulmonary interstitial congestion. Mild irregular biapical pleuroparenchymal thickening/scarring noted. Visualized upper chest demonstrates no other acute finding. Review of the MIP images confirms the above findings CTA HEAD FINDINGS Anterior circulation: Petrous segments patent bilaterally. Mild scattered atheromatous change within the cavernous/supraclinoid ICAs without hemodynamically significant stenosis. ICA termini well perfused. A1 segments patent bilaterally. Normal anterior communicating artery complex. Anterior cerebral arteries patent to their distal aspects without stenosis. No M1 stenosis or occlusion. Normal MCA bifurcations. On the left, there is a proximal left M3 occlusion, inferior division, in  keeping with the previously identified small left MCA territory infarct (series 7, image 95). Otherwise, MCA branches well perfused and symmetric. Posterior circulation: Mild focal non stenotic plaque noted within the proximal right V4 segment. Vertebral arteries otherwise widely patent without stenosis. Both picas patent. Basilar patent to  its distal aspect without stenosis. Superior cerebral arteries patent bilaterally. Left PCA supplied via the basilar. Fetal type origin of the right PCA. Both PCAs well perfused to their distal aspects without stenosis. Venous sinuses: Grossly patent allowing for timing the contrast bolus. Anatomic variants: Fetal type origin of the right PCA. No intracranial aneurysm. Review of the MIP images confirms the above findings IMPRESSION: 1. Acute proximal left M3 occlusion, inferior division, in keeping with the previously identified small left MCA territory infarct. 2. Mild atheromatous change about the carotid bifurcations and carotid siphons without hemodynamically significant or correctable stenosis. 3. Mild diffuse interlobular septal thickening within the visualized lungs, suggesting mild pulmonary interstitial congestion/edema. Electronically Signed   By: Jeannine Boga M.D.   On: 04/08/2020 01:57   MR BRAIN WO CONTRAST  Result Date: 04/07/2020 CLINICAL DATA:  Initial evaluation for neuro deficit, stroke suspected. EXAM: MRI HEAD WITHOUT CONTRAST TECHNIQUE: Multiplanar, multiecho pulse sequences of the brain and surrounding structures were obtained without intravenous contrast. COMPARISON:  Prior CT from earlier the same day. FINDINGS: Brain: Generalized age-related cerebral atrophy. Minimal T2/FLAIR hyperintensity within the periventricular deep white matter both cerebral hemispheres most consistent with chronic small vessel ischemic disease, minor in appearance, and felt to be within normal limits for age. Small remote lacunar infarct noted at the left thalamus. 6 mm focus of diffusion abnormality involving the cortical gray matter of the inferior left frontal lobe suspicious for a small acute ischemic infarct (series 5, image 76). No associated hemorrhage or mass effect. No other diffusion abnormality to suggest acute or subacute ischemia. Gray-white matter differentiation otherwise maintained. No  evidence for acute or chronic intracranial hemorrhage. No mass lesion, midline shift or mass effect. No hydrocephalus or extra-axial fluid collection. Pituitary gland suprasellar region normal. Midline structures intact. Vascular: Major intracranial vascular flow voids are maintained. Skull and upper cervical spine: Craniocervical junction within normal limits. Bone marrow signal intensity normal. No scalp soft tissue abnormality. Sinuses/Orbits: Patient status post bilateral ocular lens replacement. Paranasal sinuses are largely clear. No significant mastoid effusion. Inner ear structures grossly normal. Other: None. IMPRESSION: 1. 6 mm acute ischemic nonhemorrhagic cortical infarct involving the inferior left frontal lobe. 2. No other acute intracranial abnormality. 3. Underlying age-related cerebral atrophy, with remote left thalamic lacunar infarct. Electronically Signed   By: Jeannine Boga M.D.   On: 04/07/2020 23:30    PHYSICAL EXAM  Temp:  [98.1 F (36.7 C)-99.7 F (37.6 C)] 99.7 F (37.6 C) (08/24 1101) Pulse Rate:  [53-95] 69 (08/24 1101) Resp:  [14-21] 18 (08/24 1101) BP: (137-249)/(52-210) 137/52 (08/24 1101) SpO2:  [92 %-100 %] 92 % (08/24 1101) Weight:  [64.1 kg-67.9 kg] 64.1 kg (08/24 0257)  General - Well nourished, well developed, in no apparent distress.  Ophthalmologic - fundi not visualized due to noncooperation.  Cardiovascular - Regular rhythm and rate.  Neuro - severe hard of hearing limited neuro exam significantly.  Patient awake, alert, eyes open, not able to answer orientation questions due to severe hard of hearing.  Tracking bilaterally, no gaze preference, blinking to visual threat bilaterally.  PERRL.  No facial droop, tongue protrusion not corporative.  However, able to eat lunch without difficulty.  Bilateral upper extremity  at least 4/5, bilateral lower extremity at least 3/5. Sensation, coordination and gait not tested.   ASSESSMENT/PLAN Ms. ILANA PREZIOSO is a 86 y.o. female with history of coronary artery disease, hypertension, hyperlipidemia, TIA in 2020 with left-sided symptoms and no residual deficits, presenting with speech difficulties.   Stroke:   L frontal lobe cortical small infarct, embolic pattern, secondary to unknown source, suspicious for AF  CT head No acute abnormality.   MRI  Inferior L frontal lobe cortical infarct. Atrophy. Old L thalamic lacune   CTA head & neck acute proximal L M3 occlusion. Mild B ICA bifurcation atherosclerosis. Possible mild pulmonary interstitial congestion.  LE Doppler  No DVT.   2D Echo pending  30d monitor requested to look for AF as possible source of stroke  LDL 144  HgbA1c pending  VTE prophylaxis - Lovenox 40 mg sq daily   aspirin 81 mg daily prior to admission, now on aspirin 81 mg daily and clopidogrel 75 mg daily. Continue DAPT x 3 weeks then plavix alone  Therapy recommendations:    Disposition:  pending (independent w/ cleaning help PTA)  Hx of TIA   02/2019 -admitted for left arm/lip numbness.  CT, MRI, MRA, carotid Doppler all negative.  EF 60 to 65%.  DVT negative.  LDL 138, A1c 6.2.  Etiology concerning for possible TIA vs migraine equivalent.  Discharged on DAPT and pravastatin 20.  Pravastatin 20 was not on medication list PTA, likely again intolerable and discontinued.  Followed by Dr. Leonie Man and Frann Rider as an OP  Hypertensive Urgency  BP as high as 249/110  Home meds:  zestril 5  Now Stable . Permissive hypertension (OK if < 220/120) but gradually normalize in 5-7 days . Long-term BP goal normotensive  Hyperlipidemia  Home meds:  No statin, on omega 3  Documented allergies to multiple statins  Fish oil resumed in hospital  LDL 144, goal < 70  PCP follows lipids. Recommend consideration of [facility]  Other Stroke Risk Factors  Advanced age  Family hx stroke (father)  Coronary artery disease  Other Active Problems  Thrombocytopenia,  resolved - platelet 126->194  Upper back pain - EKG no ST-T change, CTA no aortic dissection  Likely L wrist carpal tunnel  Hospital day # 0   Rosalin Hawking, MD PhD Stroke Neurology 04/08/2020 2:38 PM  To contact Stroke Continuity provider, please refer to [email or web address] After hours, contact General Neurology

## 2020-04-08 NOTE — Progress Notes (Addendum)
[name] is a 86 year old female with medical history significant for HTN, CAD, HLD who presented to the hospital on 8/23 with new onset slurred speech witnessed by her son and was found to have elevated blood pressure of 240/83 and 6 mm acute ischemic nonhemorrhagic cortical infarct of the inferior left frontal lobe.  Patient was evaluated by neurology who recommended aspirin, Plavix, further imaging for stroke work-up including TTE and CTA head and neck as well as further work-up for potential metabolic abnormalities.  In work-up patient was found to have left lower lobe atelectasis versus infiltrate for which she was started on doxycycline for presumed CAP as well as ceftriaxone due to UTI found on UA.  This a.m. patient's high-sensitivity troponin became positive at 99 (previous 2 readings were 12, 15).  I discussed findings with her son and explained this most likely demand ischemia in the setting of acute CVA as well as infectious process in addition to hypertensive emergency; however, patient's troponin has continued to uptrend 156--219 on last reading.  Her TTE is still pending.  Given elevated troponin I have consulted cardiology.  Patient is lying in bed comfortably Able to say hello, but remaining speech is incoherent and garbled, unable to follow any commands Systolic murmur heard loudest in right upper sternal border, regular rate No peripheral edema Clear breath sounds, distant, on room air Abdomen soft  CAP.  No current O2 requirements.  Unclear of atelectasis versus pneumonia on chest x-ray -Continue senna spirometer -Continue doxycycline  UTI -Urine culture -IV ceftriaxone  Acute CVA -Follow neurology recommendations and imaging -Continue aspirin, Plavix -Monitor on telemetry  Noted cardiomegaly and pulmonary edema on chest x-ray.  Likely consistent with acute on chronic diastolic CHF (last TTE in 2020).  No obvious peripheral edema, no current O2  requirements -IV Lasix 20 mg x1, monitor output, daily weights -Follow-up TTE  Elevated high-sensitivity troponin.  Suspect demand ischemia in the setting of acute CVA, hypertensive emergency, CAP/UTI.  No ischemic changes on EKG -Continue to trend high-sensitivity troponin -Follow-up TTE -Cardiology consulted  Remaining problems per H&P from earlier today by Dr. Harrold Donath

## 2020-04-08 NOTE — Consult Note (Addendum)
Cardiology Consultation:   Patient ID: [name] MRN: [id number]; DOB: [birth year]  Admit date: 04/07/2020 Date of Consult: 04/08/2020  Primary Care Provider: Lajean Manes, MD [facility] Cardiologist: Sinclair Grooms, MD  [place] Electrophysiologist:  None    Patient Profile:   [name] is a 86 y.o. female with a hx of CAD of 1st diag and LAD by cath in 2002, HTN, HLD, TIA who is being seen today for the evaluation of elevated troponin at the request of Dr. Lonny Prude.  History of Present Illness:   Ms. Tuckett has been seen by Dr. Tamala Julian in the past. Chart review says history of CAD with mod prox LAD and first diag stenosis by cath in 2002 management medically since LV function was normal. Last visit was in 2017, symptomatically stable. No changes were made. Last echo was  02/2019 showed LVEF 01-02%, Diastolic dysfunction, mod dilated LA and RA, mod Mitral calcifications, mild calcification of arotic valve, cannot exclude PFO with left to right shunt at atrial level.   The patient presented to [facility] ER 8/23 with new onset slurred speech.  BP to markedly elevated to 240/83 on presentation. Patietn was found to have acute ischemic infarct on MRI brain. Neurology consulted who started aspirin, plavix. CXR showed possible CAP so she was started on abx. Work-up also showed UTI CTA head and neck ordered. HS troponin level ordered which has continued to rise. Echo was ordered.   HS troponin 15>99>156>219>234 EKG: NSR, 60 bpm, PACs, nonspecific T wave changes Labs: K+3.9, sNA 139 Glucose 136 Creatinine 0.22, BUN 22 Albumin 3.3 WBC 8.9, Hgb 13.9  The pt is not a good historian  Speech is jumbled, not coherent  Past Medical History:  Diagnosis Date  . Arthritis   . CAD (coronary artery disease)    a. Mod prox LAD & first diagonal stenosis by cath 2002 - managed medically since that time with normal LV function.  . Cataract   . Hyperlipidemia   . Hypertension     Past  Surgical History:  Procedure Laterality Date  . ABDOMINAL HYSTERECTOMY    . APPENDECTOMY    . BACK SURGERY    . CARPAL TUNNEL RELEASE    . CHOLECYSTECTOMY N/A 06/19/2013   Procedure: LAPAROSCOPIC CHOLECYSTECTOMY;  Surgeon: Harl Bowie, MD;  Location: [place];  Service: General;  Laterality: N/A;  . ESOPHAGOGASTRODUODENOSCOPY N/A 04/26/2017   Procedure: ESOPHAGOGASTRODUODENOSCOPY (EGD) W/ DILATION;  Surgeon: Ronnette Juniper, MD;  Location: [facility];  Service: Gastroenterology;  Laterality: N/A;  . HERNIA REPAIR    . INSERTION OF MESH  05/19/2018   Procedure: INSERTION OF MESH;  Surgeon: Excell Seltzer, MD;  Location: [facility];  Service: General;;  . IR GENERIC HISTORICAL  03/18/2016   IR RADIOLOGIST EVAL & MGMT 03/18/2016 Corrie Mckusick, DO [facility] INTERV RAD  . KNEE SURGERY    . SHOULDER SURGERY    . SPLENECTOMY, TOTAL    . VENTRAL HERNIA REPAIR N/A 05/19/2018   Procedure: HERNIA REPAIR VENTRAL ADULT;  Surgeon: Excell Seltzer, MD;  Location: [facility];  Service: General;  Laterality: N/A;     Home Medications:  Prior to Admission medications   Medication Sig Start Date End Date Taking? Authorizing Provider  acetaminophen (TYLENOL 8 HOUR ARTHRITIS PAIN) 650 MG CR tablet Take 650 mg by mouth every 8 (eight) hours as needed for pain.    Yes [provider]  aspirin EC 81 MG tablet Take 81 mg by mouth daily.  Yes [provider]  lisinopril (ZESTRIL) 5 MG tablet Take 5 mg by mouth daily. 03/29/19  Yes [provider]  Multiple Vitamins-Minerals (PRESERVISION AREDS PO) Take 1 capsule by mouth daily.    Yes [provider]  Omega-3 Fatty Acids (FISH OIL PO) Take 1 capsule by mouth daily.    Yes [provider]  traZODone (DESYREL) 50 MG tablet Take 25 mg by mouth at bedtime as needed for sleep.  09/28/18  Yes [provider]    Inpatient Medications: Scheduled Meds: . aspirin EC  81 mg Oral Daily  . clopidogrel  75 mg Oral Daily  .  enoxaparin (LOVENOX) injection  40 mg Subcutaneous Q24H  . omega-3 acid ethyl esters  1 g Oral Daily   Continuous Infusions: . sodium chloride 75 mL/hr at 04/08/20 0635  . sodium chloride Stopped (04/08/20 5397)  . cefTRIAXone (ROCEPHIN)  IV Stopped (04/08/20 0452)  . doxycycline (VIBRAMYCIN) IV 100 mg (04/08/20 0635)   PRN Meds: sodium chloride, acetaminophen, ondansetron (ZOFRAN) IV, senna-docusate, traZODone  Allergies:    Allergies  Allergen Reactions  . Felodipine Anaphylaxis and Other (See Comments)    Reaction: unknown possible nausea or rash  . Prednisone Other (See Comments)    Unknown reaction  . Shrimp [Shellfish Allergy] Nausea And Vomiting  . Codeine   . Crestor [Rosuvastatin]   . Feldene [Piroxicam]   . Lescol [Fluvastatin]   . Lipitor [Atorvastatin]   . Nexium [Esomeprazole]   . Pravachol [Pravastatin]   . Prilosec Otc [Omeprazole Magnesium]   . Statins Other (See Comments)    Muscle/leg pain.  Earnestine Mealing [Colesevelam]   . Zetia [Ezetimibe]   . Zithromax [Azithromycin]   . Zocor [Simvastatin]   . Omeprazole Other (See Comments)    Reaction: unknown possible nausea or rash    Social History:   Social History   Socioeconomic History  . Marital status: Widowed    Spouse name: Not on file  . Number of children: Not on file  . Years of education: Not on file  . Highest education level: Not on file  Occupational History  . Not on file  Tobacco Use  . Smoking status: Never Smoker  . Smokeless tobacco: Never Used  Vaping Use  . Vaping Use: Never used  Substance and Sexual Activity  . Alcohol use: No  . Drug use: No  . Sexual activity: Not on file  Other Topics Concern  . Not on file  Social History Narrative  . Not on file   Social Determinants of Health   Financial Resource Strain:   . Difficulty of Paying Living Expenses: Not on file  Food Insecurity:   . Worried About Charity fundraiser in the Last Year: Not on file  . Ran Out of Food in  the Last Year: Not on file  Transportation Needs:   . Lack of Transportation (Medical): Not on file  . Lack of Transportation (Non-Medical): Not on file  Physical Activity:   . Days of Exercise per Week: Not on file  . Minutes of Exercise per Session: Not on file  Stress:   . Feeling of Stress : Not on file  Social Connections:   . Frequency of Communication with Friends and Family: Not on file  . Frequency of Social Gatherings with Friends and Family: Not on file  . Attends Religious Services: Not on file  . Active Member of Clubs or Organizations: Not on file  . Attends Archivist  Meetings: Not on file  . Marital Status: Not on file  Intimate Partner Violence:   . Fear of Current or Ex-Partner: Not on file  . Emotionally Abused: Not on file  . Physically Abused: Not on file  . Sexually Abused: Not on file    Family History:   Family History  Problem Relation Age of Onset  . Heart disease Mother        "enlarged valve"  . Cancer Mother        Ovarian  . Stroke Father   . Cancer Sister        Colon     ROS:  Please see the history of present illness.  All other ROS reviewed and negative.     Physical Exam/Data:   Vitals:   04/08/20 0507 04/08/20 0720 04/08/20 1101 04/08/20 1552  BP: (!) 147/130 (!) 150/52 (!) 137/52 (!) 163/65  Pulse: 79 70 69 (!) 56  Resp: 18 18 18 16   Temp: 98.7 F (37.1 C) 99.1 F (37.3 C) 99.7 F (37.6 C) 97.6 F (36.4 C)  TempSrc: Oral Oral Oral Oral  SpO2: 94% 94% 92% 100%  Weight:      Height:        Intake/Output Summary (Last 24 hours) at 04/08/2020 1701 Last data filed at 04/08/2020 3790 Gross per 24 hour  Intake 522.44 ml  Output 250 ml  Net 272.44 ml   Last 3 Weights 04/08/2020 04/07/2020 10/24/2019  Weight (lbs) 141 lb 5 oz 149 lb 11.1 oz 149 lb 9.6 oz  Weight (kg) 64.1 kg 67.9 kg 67.858 kg     Body mass index is 25.03 kg/m.  General:  Well nourished, well developed, in no acute distress HEENT: normal Lymph: no  adenopathy Neck:  JVP is normal  No bruits Endocrine:  No thryomegaly Cardiac:  normal S1, S2; RRR; IIVI systolic murmur Lungs:  clear to auscultation bilaterally, no wheezing, rhonchi or rales  Abd: soft, nontender, no hepatomegaly  Ext: no edema 2+ pulses Musculoskeletal:  Moving all extremities Skin: warm and dry  Neuro:  Deferred   EKG:  The EKG was personally reviewed and demonstrates: SR with PACs  60 bpm    Telemetry:  Telemetry was personally reviewed and demonstrates:  SR  Relevant CV Studies:  Echo 04/08/20 1. Left ventricular ejection fraction, by estimation, is 60 to 65%. The  left ventricle has normal function. The left ventricle has no regional  wall motion abnormalities. There is mild concentric left ventricular  hypertrophy. Left ventricular diastolic  parameters are consistent with Grade I diastolic dysfunction (impaired  relaxation). Elevated left ventricular end-diastolic pressure. The average  left ventricular global longitudinal strain is -18.7 %. The global  longitudinal strain is normal.  2. Right ventricular systolic function is normal. The right ventricular  size is normal. There is normal pulmonary artery systolic pressure.  3. Left atrial size was severely dilated.  4. The mitral valve is normal in structure. Trivial mitral valve  regurgitation. No evidence of mitral stenosis.  5. Tricuspid valve regurgitation is mild to moderate.  6. The aortic valve is tricuspid. Aortic valve regurgitation is not  visualized. No aortic stenosis is present.  7. The inferior vena cava is normal in size with <50% respiratory  variability, suggesting right atrial pressure of 8 mmHg.    Laboratory Data:  High Sensitivity Troponin:   Recent Labs  Lab 04/08/20 0302 04/08/20 0741 04/08/20 1034 04/08/20 1219 04/08/20 1457  TROPONINIHS 15 99* 156* 219* 234*  Chemistry Recent Labs  Lab 04/07/20 2009 04/07/20 2009 04/07/20 2026 04/07/20 2205  04/08/20 0741  NA 137  --  135  --  139  K >7.5*   < > 7.6* 4.2 3.9  CL 103  --  105  --  104  CO2 24  --   --   --  25  GLUCOSE 92  --  90  --  136*  BUN 30*  --  51*  --  22  CREATININE 0.94  --  0.90  --  0.83  CALCIUM 9.5  --   --   --  9.5  GFRNONAA 54*  --   --   --  >60  GFRAA >60  --   --   --  >60  ANIONGAP 10  --   --   --  10   < > = values in this interval not displayed.    Recent Labs  Lab 04/07/20 2009 04/08/20 0741  PROT 5.5* 5.4*  ALBUMIN 3.5 3.3*  AST 60* 25  ALT 13 20  ALKPHOS 59 55  BILITOT 2.2* 0.8   Hematology Recent Labs  Lab 04/07/20 2009 04/07/20 2026 04/08/20 0741  WBC 6.7  --  8.9  RBC 4.50  --  4.37  HGB 14.1 14.6 13.9  HCT 43.4 43.0 41.6  MCV 96.4  --  95.2  MCH 31.3  --  31.8  MCHC 32.5  --  33.4  RDW 15.0  --  14.7  PLT 126*  --  194   BNPNo results for input(s): BNP, PROBNP in the last 168 hours.  DDimer No results for input(s): DDIMER in the last 168 hours.   Radiology/Studies:  CT ANGIO HEAD W OR WO CONTRAST  Result Date: 04/08/2020 CLINICAL DATA:  Follow-up examination for acute stroke. EXAM: CT ANGIOGRAPHY HEAD AND NECK TECHNIQUE: Multidetector CT imaging of the head and neck was performed using the standard protocol during bolus administration of intravenous contrast. Multiplanar CT image reconstructions and MIPs were obtained to evaluate the vascular anatomy. Carotid stenosis measurements (when applicable) are obtained utilizing NASCET criteria, using the distal internal carotid diameter as the denominator. CONTRAST:  82mL OMNIPAQUE IOHEXOL 350 MG/ML SOLN COMPARISON:  Prior studies from 04/07/2020 FINDINGS: CTA NECK FINDINGS Aortic arch: Visualized aortic arch of normal caliber with normal 3 vessel morphology. Mild atheromatous change about the arch and origin of the great vessels without hemodynamically significant stenosis. Right carotid system: Right CCA widely patent from its origin to the bifurcation without stenosis. Mild  atheromatous change about the right bifurcation without significant stenosis. Right ICA mildly tortuous and medialized into the retropharyngeal space but is widely patent to the skull base without stenosis, dissection or occlusion. Left carotid system: Left CCA patent from its origin to the bifurcation without flow-limiting stenosis. Mild atheromatous change about the origin of the left ICA without significant stenosis. Left ICA mildly tortuous and medialized into the retropharyngeal space but is widely patent to the skull base without stenosis, dissection or occlusion. Vertebral arteries: Both vertebral arteries arise from the subclavian arteries. Right vertebral artery dominant. Vertebral arteries widely patent without stenosis, dissection or occlusion. Skeleton: No definite acute osseous abnormality. Bones are somewhat diffusely sclerotic in appearance without discrete lytic or blastic osseous lesion Other neck: No other acute soft tissue abnormality within the neck. Few scattered thyroid nodules noted, largest of which measures 7 mm on the right. Findings felt to be of doubtful significance given size and patient age. No follow-up  imaging recommended regarding these lesions. No other mass lesion or adenopathy. Upper chest: Mild scattered interlobular septal thickening noted within the visualized lungs, suggesting underlying mild pulmonary interstitial congestion. Mild irregular biapical pleuroparenchymal thickening/scarring noted. Visualized upper chest demonstrates no other acute finding. Review of the MIP images confirms the above findings CTA HEAD FINDINGS Anterior circulation: Petrous segments patent bilaterally. Mild scattered atheromatous change within the cavernous/supraclinoid ICAs without hemodynamically significant stenosis. ICA termini well perfused. A1 segments patent bilaterally. Normal anterior communicating artery complex. Anterior cerebral arteries patent to their distal aspects without stenosis.  No M1 stenosis or occlusion. Normal MCA bifurcations. On the left, there is a proximal left M3 occlusion, inferior division, in keeping with the previously identified small left MCA territory infarct (series 7, image 95). Otherwise, MCA branches well perfused and symmetric. Posterior circulation: Mild focal non stenotic plaque noted within the proximal right V4 segment. Vertebral arteries otherwise widely patent without stenosis. Both picas patent. Basilar patent to its distal aspect without stenosis. Superior cerebral arteries patent bilaterally. Left PCA supplied via the basilar. Fetal type origin of the right PCA. Both PCAs well perfused to their distal aspects without stenosis. Venous sinuses: Grossly patent allowing for timing the contrast bolus. Anatomic variants: Fetal type origin of the right PCA. No intracranial aneurysm. Review of the MIP images confirms the above findings IMPRESSION: 1. Acute proximal left M3 occlusion, inferior division, in keeping with the previously identified small left MCA territory infarct. 2. Mild atheromatous change about the carotid bifurcations and carotid siphons without hemodynamically significant or correctable stenosis. 3. Mild diffuse interlobular septal thickening within the visualized lungs, suggesting mild pulmonary interstitial congestion/edema. Electronically Signed   By: Jeannine Boga M.D.   On: 04/08/2020 01:57   DG Chest 2 View  Result Date: 04/08/2020 CLINICAL DATA:  86 year old female with slurred speech. EXAM: CHEST - 2 VIEW COMPARISON:  Chest radiograph dated 03/09/2019. FINDINGS: There is cardiomegaly with mild vascular congestion. Left lung base density may represent atelectasis or infiltrate. The right lung is clear. No pleural effusion or pneumothorax. There is a moderate size hiatal hernia. No acute osseous pathology. IMPRESSION: 1. Cardiomegaly with mild vascular congestion. 2. Left lung base atelectasis versus infiltrate. 3. Moderate size  hiatal hernia. Electronically Signed   By: Anner Crete M.D.   On: 04/08/2020 01:10   CT HEAD WO CONTRAST  Result Date: 04/07/2020 CLINICAL DATA:  Acute neuro deficit.  Slurred speech EXAM: CT HEAD WITHOUT CONTRAST TECHNIQUE: Contiguous axial images were obtained from the base of the skull through the vertex without intravenous contrast. COMPARISON:  CT head 01/02/2020 FINDINGS: Brain: Mild cortical atrophy. Negative for hydrocephalus. Chronic infarct left thalamus unchanged. Mild chronic ischemic changes in the white matter. Negative for acute infarct, hemorrhage, mass Vascular: Negative for hyperdense vessel Skull: Negative Sinuses/Orbits: Paranasal sinuses clear. Bilateral cataract extraction. Other: None IMPRESSION: No acute abnormality no change from the prior study. Electronically Signed   By: Franchot Gallo M.D.   On: 04/07/2020 20:30   CT ANGIO NECK W OR WO CONTRAST  Result Date: 04/08/2020 CLINICAL DATA:  Follow-up examination for acute stroke. EXAM: CT ANGIOGRAPHY HEAD AND NECK TECHNIQUE: Multidetector CT imaging of the head and neck was performed using the standard protocol during bolus administration of intravenous contrast. Multiplanar CT image reconstructions and MIPs were obtained to evaluate the vascular anatomy. Carotid stenosis measurements (when applicable) are obtained utilizing NASCET criteria, using the distal internal carotid diameter as the denominator. CONTRAST:  11mL OMNIPAQUE IOHEXOL 350 MG/ML SOLN COMPARISON:  Prior studies from 04/07/2020 FINDINGS: CTA NECK FINDINGS Aortic arch: Visualized aortic arch of normal caliber with normal 3 vessel morphology. Mild atheromatous change about the arch and origin of the great vessels without hemodynamically significant stenosis. Right carotid system: Right CCA widely patent from its origin to the bifurcation without stenosis. Mild atheromatous change about the right bifurcation without significant stenosis. Right ICA mildly tortuous and  medialized into the retropharyngeal space but is widely patent to the skull base without stenosis, dissection or occlusion. Left carotid system: Left CCA patent from its origin to the bifurcation without flow-limiting stenosis. Mild atheromatous change about the origin of the left ICA without significant stenosis. Left ICA mildly tortuous and medialized into the retropharyngeal space but is widely patent to the skull base without stenosis, dissection or occlusion. Vertebral arteries: Both vertebral arteries arise from the subclavian arteries. Right vertebral artery dominant. Vertebral arteries widely patent without stenosis, dissection or occlusion. Skeleton: No definite acute osseous abnormality. Bones are somewhat diffusely sclerotic in appearance without discrete lytic or blastic osseous lesion Other neck: No other acute soft tissue abnormality within the neck. Few scattered thyroid nodules noted, largest of which measures 7 mm on the right. Findings felt to be of doubtful significance given size and patient age. No follow-up imaging recommended regarding these lesions. No other mass lesion or adenopathy. Upper chest: Mild scattered interlobular septal thickening noted within the visualized lungs, suggesting underlying mild pulmonary interstitial congestion. Mild irregular biapical pleuroparenchymal thickening/scarring noted. Visualized upper chest demonstrates no other acute finding. Review of the MIP images confirms the above findings CTA HEAD FINDINGS Anterior circulation: Petrous segments patent bilaterally. Mild scattered atheromatous change within the cavernous/supraclinoid ICAs without hemodynamically significant stenosis. ICA termini well perfused. A1 segments patent bilaterally. Normal anterior communicating artery complex. Anterior cerebral arteries patent to their distal aspects without stenosis. No M1 stenosis or occlusion. Normal MCA bifurcations. On the left, there is a proximal left M3 occlusion,  inferior division, in keeping with the previously identified small left MCA territory infarct (series 7, image 95). Otherwise, MCA branches well perfused and symmetric. Posterior circulation: Mild focal non stenotic plaque noted within the proximal right V4 segment. Vertebral arteries otherwise widely patent without stenosis. Both picas patent. Basilar patent to its distal aspect without stenosis. Superior cerebral arteries patent bilaterally. Left PCA supplied via the basilar. Fetal type origin of the right PCA. Both PCAs well perfused to their distal aspects without stenosis. Venous sinuses: Grossly patent allowing for timing the contrast bolus. Anatomic variants: Fetal type origin of the right PCA. No intracranial aneurysm. Review of the MIP images confirms the above findings IMPRESSION: 1. Acute proximal left M3 occlusion, inferior division, in keeping with the previously identified small left MCA territory infarct. 2. Mild atheromatous change about the carotid bifurcations and carotid siphons without hemodynamically significant or correctable stenosis. 3. Mild diffuse interlobular septal thickening within the visualized lungs, suggesting mild pulmonary interstitial congestion/edema. Electronically Signed   By: Jeannine Boga M.D.   On: 04/08/2020 01:57   MR BRAIN WO CONTRAST  Result Date: 04/07/2020 CLINICAL DATA:  Initial evaluation for neuro deficit, stroke suspected. EXAM: MRI HEAD WITHOUT CONTRAST TECHNIQUE: Multiplanar, multiecho pulse sequences of the brain and surrounding structures were obtained without intravenous contrast. COMPARISON:  Prior CT from earlier the same day. FINDINGS: Brain: Generalized age-related cerebral atrophy. Minimal T2/FLAIR hyperintensity within the periventricular deep white matter both cerebral hemispheres most consistent with chronic small vessel ischemic disease, minor in appearance, and felt to be within normal  limits for age. Small remote lacunar infarct noted at  the left thalamus. 6 mm focus of diffusion abnormality involving the cortical gray matter of the inferior left frontal lobe suspicious for a small acute ischemic infarct (series 5, image 76). No associated hemorrhage or mass effect. No other diffusion abnormality to suggest acute or subacute ischemia. Gray-white matter differentiation otherwise maintained. No evidence for acute or chronic intracranial hemorrhage. No mass lesion, midline shift or mass effect. No hydrocephalus or extra-axial fluid collection. Pituitary gland suprasellar region normal. Midline structures intact. Vascular: Major intracranial vascular flow voids are maintained. Skull and upper cervical spine: Craniocervical junction within normal limits. Bone marrow signal intensity normal. No scalp soft tissue abnormality. Sinuses/Orbits: Patient status post bilateral ocular lens replacement. Paranasal sinuses are largely clear. No significant mastoid effusion. Inner ear structures grossly normal. Other: None. IMPRESSION: 1. 6 mm acute ischemic nonhemorrhagic cortical infarct involving the inferior left frontal lobe. 2. No other acute intracranial abnormality. 3. Underlying age-related cerebral atrophy, with remote left thalamic lacunar infarct. Electronically Signed   By: Jeannine Boga M.D.   On: 04/07/2020 23:30   ECHOCARDIOGRAM COMPLETE  Result Date: 04/08/2020    ECHOCARDIOGRAM REPORT   Patient Name:   JADAYA SOMMERFIELD Date of Exam: 04/08/2020 Medical Rec #:  [id number]      Height:       63.0 in Accession #:    [id number]     Weight:       141.3 lb Date of Birth:  09/25/1928      BSA:          1.668 m Patient Age:    63 years       BP:           163/65 mmHg Patient Gender: F              HR:           56 bpm. Exam Location:  Inpatient Procedure: 2D Echo, 3D Echo and Strain Analysis Indications:    Stroke 434.91 / I163.9  History:        Patient has prior history of Echocardiogram examinations, most                 recent 03/10/2019. CAD; Risk  Factors:Hypertension and                 Dyslipidemia.  Sonographer:    Darlina Sicilian RDCS Referring Phys: [id number] [place]  1. Left ventricular ejection fraction, by estimation, is 60 to 65%. The left ventricle has normal function. The left ventricle has no regional wall motion abnormalities. There is mild concentric left ventricular hypertrophy. Left ventricular diastolic parameters are consistent with Grade I diastolic dysfunction (impaired relaxation). Elevated left ventricular end-diastolic pressure. The average left ventricular global longitudinal strain is -18.7 %. The global longitudinal strain is normal.  2. Right ventricular systolic function is normal. The right ventricular size is normal. There is normal pulmonary artery systolic pressure.  3. Left atrial size was severely dilated.  4. The mitral valve is normal in structure. Trivial mitral valve regurgitation. No evidence of mitral stenosis.  5. Tricuspid valve regurgitation is mild to moderate.  6. The aortic valve is tricuspid. Aortic valve regurgitation is not visualized. No aortic stenosis is present.  7. The inferior vena cava is normal in size with <50% respiratory variability, suggesting right atrial pressure of 8 mmHg. FINDINGS  Left Ventricle: Left ventricular ejection fraction, by estimation, is 60 to 65%.  The left ventricle has normal function. The left ventricle has no regional wall motion abnormalities. The average left ventricular global longitudinal strain is -18.7 %. The global longitudinal strain is normal. The left ventricular internal cavity size was normal in size. There is mild concentric left ventricular hypertrophy. Left ventricular diastolic parameters are consistent with Grade I diastolic dysfunction (impaired relaxation). Elevated left ventricular end-diastolic pressure. Right Ventricle: The right ventricular size is normal. No increase in right ventricular wall thickness. Right ventricular systolic  function is normal. There is normal pulmonary artery systolic pressure. The tricuspid regurgitant velocity is 2.50 m/s, and  with an assumed right atrial pressure of 8 mmHg, the estimated right ventricular systolic pressure is 98.9 mmHg. Left Atrium: Left atrial size was severely dilated. Right Atrium: Right atrial size was normal in size. Pericardium: There is no evidence of pericardial effusion. Mitral Valve: The mitral valve is normal in structure. Normal mobility of the mitral valve leaflets. Moderate mitral annular calcification. Trivial mitral valve regurgitation. No evidence of mitral valve stenosis. Tricuspid Valve: The tricuspid valve is normal in structure. Tricuspid valve regurgitation is mild to moderate. No evidence of tricuspid stenosis. Aortic Valve: The aortic valve is tricuspid. . There is moderate thickening and moderate calcification of the aortic valve. Aortic valve regurgitation is not visualized. No aortic stenosis is present. There is moderate thickening of the aortic valve. There is moderate calcification of the aortic valve. Pulmonic Valve: The pulmonic valve was normal in structure. Pulmonic valve regurgitation is mild. No evidence of pulmonic stenosis. Aorta: The aortic root is normal in size and structure. Venous: The inferior vena cava is normal in size with less than 50% respiratory variability, suggesting right atrial pressure of 8 mmHg. IAS/Shunts: No atrial level shunt detected by color flow Doppler.  LEFT VENTRICLE PLAX 2D LVIDd:         3.30 cm  Diastology LVIDs:         1.90 cm  LV e' lateral:   4.68 cm/s LV PW:         1.20 cm  LV E/e' lateral: 18.1 LV IVS:        1.10 cm  LV e' medial:    5.66 cm/s LVOT diam:     1.80 cm  LV E/e' medial:  14.9 LV SV:         54 LV SV Index:   32       2D Longitudinal Strain LVOT Area:     2.54 cm 2D Strain GLS (A2C):   -18.1 %                         2D Strain GLS (A3C):   -19.2 %                         2D Strain GLS (A4C):   -18.8 %                          2D Strain GLS Avg:     -18.7 %                          3D Volume EF:                         3D EF:        62 %  LV EDV:       101 ml                         LV ESV:       38 ml                         LV SV:        63 ml RIGHT VENTRICLE TAPSE (M-mode): 2.8 cm LEFT ATRIUM             Index       RIGHT ATRIUM           Index LA diam:        3.60 cm 2.16 cm/m  RA Area:     18.50 cm LA Vol (A2C):   65.8 ml 39.44 ml/m RA Volume:   47.50 ml  28.47 ml/m LA Vol (A4C):   66.1 ml 39.62 ml/m LA Biplane Vol: 66.9 ml 40.10 ml/m  AORTIC VALVE LVOT Vmax:   117.00 cm/s LVOT Vmean:  78.300 cm/s LVOT VTI:    0.211 m  AORTA Ao Root diam: 2.80 cm Ao Asc diam:  3.20 cm MITRAL VALVE                TRICUSPID VALVE MV Area (PHT): 2.60 cm     TR Peak grad:   25.0 mmHg MV Decel Time: 292 msec     TR Vmax:        250.00 cm/s MV E velocity: 84.50 cm/s MV A velocity: 128.00 cm/s  SHUNTS MV E/A ratio:  0.66         Systemic VTI:  0.21 m                             Systemic Diam: 1.80 cm Skeet Latch MD Electronically signed by Skeet Latch MD Signature Date/Time: 04/08/2020/4:41:14 PM    Final    VAS [place] LOWER EXTREMITY VENOUS (DVT)  Result Date: 04/08/2020  Lower Venous DVTStudy Indications: Stroke.  Limitations: Pain. Performing Technologist: Antonieta Pert RDMS, RVT  Examination Guidelines: A complete evaluation includes B-mode imaging, spectral Doppler, color Doppler, and power Doppler as needed of all accessible portions of each vessel. Bilateral testing is considered an integral part of a complete examination. Limited examinations for reoccurring indications may be performed as noted. The reflux portion of the exam is performed with the patient in reverse Trendelenburg.  +---------+---------------+---------+-----------+----------+--------------+ RIGHT    CompressibilityPhasicitySpontaneityPropertiesThrombus Aging  +---------+---------------+---------+-----------+----------+--------------+ CFV      Full           Yes      Yes                                 +---------+---------------+---------+-----------+----------+--------------+ SFJ      Full                                                        +---------+---------------+---------+-----------+----------+--------------+ FV Prox  Full                                                        +---------+---------------+---------+-----------+----------+--------------+  FV Mid   Full                                                        +---------+---------------+---------+-----------+----------+--------------+ FV DistalFull                                                        +---------+---------------+---------+-----------+----------+--------------+ PFV      Full                                                        +---------+---------------+---------+-----------+----------+--------------+ POP      Full           Yes      Yes                                 +---------+---------------+---------+-----------+----------+--------------+ PTV      Full                                                        +---------+---------------+---------+-----------+----------+--------------+ PERO     Full                                                        +---------+---------------+---------+-----------+----------+--------------+ GSV      Full                                                        +---------+---------------+---------+-----------+----------+--------------+ Hypoechoic fliud collection seen mid thigh aprox 6.4 x 3.1 x 1.7cm  +---------+---------------+---------+-----------+----------+--------------+ LEFT     CompressibilityPhasicitySpontaneityPropertiesThrombus Aging +---------+---------------+---------+-----------+----------+--------------+ CFV      Full           Yes      Yes                                  +---------+---------------+---------+-----------+----------+--------------+ SFJ      Full                                                        +---------+---------------+---------+-----------+----------+--------------+ FV Prox  Full                                                        +---------+---------------+---------+-----------+----------+--------------+  FV Mid   Full                                                        +---------+---------------+---------+-----------+----------+--------------+ FV DistalFull                                                        +---------+---------------+---------+-----------+----------+--------------+ PFV      Full                                                        +---------+---------------+---------+-----------+----------+--------------+ POP      Full           Yes      Yes                                 +---------+---------------+---------+-----------+----------+--------------+ PTV      Full                                                        +---------+---------------+---------+-----------+----------+--------------+ PERO     Full                                                        +---------+---------------+---------+-----------+----------+--------------+ GSV      Full                                                        +---------+---------------+---------+-----------+----------+--------------+     Summary: RIGHT: - There is no evidence of deep vein thrombosis in the lower extremity.  - A cystic structure is found in the popliteal fossa. - In addition to Bakers cyst, patient has a large hypoechoic fluid collection mid right thigh.  LEFT: - There is no evidence of deep vein thrombosis in the lower extremity.  - A cystic structure is found in the popliteal fossa.  *See table(s) above for measurements and observations.    Preliminary    {    Assessment and Plan:    Elevated troponin Pt with hx of CAD   She  had remote cath (2002) with dz in LAD, Diag  She appears to remain asymptomatic Very mild troponin elevation consistenith some strain in setting of CVA  Pt appear comfortable   Does not appear to have CP  EK without acute changes  Echo LVEF is OK   Keep on ASA and Plavix   Watch CBC  2  CVA  Plan is for 30 day event monitior Echo  LVEF and RVEF are normal    3  HL    - Lipid profileL total chol 226, TG 83, HDL 65, LDL 144. Consider PCSK9 as outpt as allergic to many statins    4/  Hypertensive urgency - BP as high as 249/110 - pta zestril 5 mg - permissive hypertension  CAP/UTI - abx per IM   Signed, Cadence Ninfa Meeker, PA-C  04/08/2020 5:01 PM   Patient seen and examined   I have entered my findings for Hx and PE into consult note above    WIll continue to follow   Dorris Carnes MD

## 2020-04-08 NOTE — Progress Notes (Signed)
Bilateral lower extremity venous duplex complete.  Please see CV proc tab for preliminary results. Lita Mains- RDMS, RVT 12:33 PM  04/08/2020

## 2020-04-08 NOTE — Consult Note (Addendum)
Neurology Consultation  Reason for Consult: Stroke Referring Physician: Dr. Vallery Ridge, EDP  CC: Speech difficulty  History is obtained from: Chart, patient's son at bedside  HPI: [name] is a 86 y.o. female lives independently with assistance from the facility with cleaning but is able to cook and take care of herself independently, coronary artery disease, hypertension, hyperlipidemia, TIA in 2020 with left-sided symptoms and no residual deficits, presented to the emergency room after son noticed that she is having difficulty with her speech which became very slurred and difficult to comprehend.  EMS was called and did not notice any complaints.  Slurred speech improved and there was no focal tingling numbness or weakness.  She was evaluated in the emergency room and routine tests ordered.  She had a very high potassium as well as extremely elevated blood pressures.  Her symptoms, although had resolved, brain imaging to rule out stroke was obtained that showed a punctate cortical left frontal acute ischemic stroke, for which neurological consultation was obtained. At the time of my evaluation of the patient, she was in bed, moaning in pain with garbled speech.  She appeared very uncomfortable and was unable to provide any history.  Her son at bedside provided the history.  The son reports that he calls her at least twice every day and she was normal on the morning of 04/07/2020 when he spoke with her over the phone and then later around noon dropped some groceries-she appeared in her normal state of health.  Then around 6:45 PM when he spoke with her again, her speech was very garbled and difficult to comprehend.  EMS was called and and brought her to the hospital.  Her symptoms are resolved reportedly but she again started to exhibit symptoms of slurred speech along with complaints of pain in her back and legs.    LKW: 12 PM on 04/07/2020. tpa given?: no, outside the window Premorbid modified  Rankin scale (mRS): 2 but mainly due to arthritis  ROS: Obtained and negative except noted in HPI  Past Medical History:  Diagnosis Date  . Arthritis   . CAD (coronary artery disease)    a. Mod prox LAD & first diagonal stenosis by cath 2002 - managed medically since that time with normal LV function.  . Cataract   . Hyperlipidemia   . Hypertension     Family History  Problem Relation Age of Onset  . Heart disease Mother        "enlarged valve"  . Cancer Mother        Ovarian  . Stroke Father   . Cancer Sister        Colon    Social History:   reports that she has never smoked. She has never used smokeless tobacco. She reports that she does not drink alcohol and does not use drugs.  Medications No current facility-administered medications for this encounter.  Current Outpatient Medications:  .  acetaminophen (TYLENOL 8 HOUR ARTHRITIS PAIN) 650 MG CR tablet, Take 650 mg by mouth every 8 (eight) hours as needed for pain. , Disp: , Rfl:  .  aspirin EC 81 MG tablet, Take 81 mg by mouth daily. , Disp: , Rfl:  .  lisinopril (ZESTRIL) 5 MG tablet, Take 5 mg by mouth daily., Disp: , Rfl:  .  Multiple Vitamins-Minerals (PRESERVISION AREDS PO), Take 1 capsule by mouth daily. , Disp: , Rfl:  .  Omega-3 Fatty Acids (FISH OIL PO), Take 1 capsule by mouth daily. ,  Disp: , Rfl:  .  traZODone (DESYREL) 50 MG tablet, Take 25 mg by mouth at bedtime as needed for sleep. , Disp: , Rfl:   Exam: Current vital signs: BP (!) 182/131   Pulse (!) 58   Temp 98.1 F (36.7 C) (Oral)   Resp 16   Ht 5\' 3"  (1.6 m)   Wt 67.9 kg   SpO2 100%   BMI 26.52 kg/m  Vital signs in last 24 hours: Temp:  [98.1 F (36.7 C)] 98.1 F (36.7 C) (08/23 1935) Pulse Rate:  [53-64] 58 (08/23 2300) Resp:  [14-21] 16 (08/24 0005) BP: (166-249)/(63-210) 182/131 (08/24 0005) SpO2:  [97 %-100 %] 100 % (08/23 2300) Weight:  [67.9 kg] 67.9 kg (08/23 2138) General: Awake alert, in distress due to pain HEENT:  Normocephalic atraumatic Lungs: Clear Cardiovascular: Regular rhythm Extremities warm well perfused Neurological exam She is awake, alert, moaning in pain. She has dysarthric speech and her words are incomprehensible. She does not follow verbal commands consistently-she is also very hard of hearing.  She is able to mimic actions. Cranial nerves: Pupils are equal round reactive light, extraocular movements appear unrestricted, blinks to threat from both sides, her face appears symmetric. Motor exam: She is able to lift both lower upper and lower extremities against gravity but lets all 4 fall to the bed before 10 and 5 seconds for upper and lower extremities respectively. Chest exam: Intact to touch all over Coordination: Difficult to perform given her mentation Gait testing deferred for her comfort and safety. NIH stroke scale  1a Level of Conscious.: 0 1b LOC Questions: 2 1c LOC Commands: 1 2 Best Gaze: 0 3 Visual: 0 4 Facial Palsy: 0 5a Motor Arm - left: 1 5b Motor Arm - Right: 1 6a Motor Leg - Left: 1 6b Motor Leg - Right: 1 7 Limb Ataxia: 0 8 Sensory: 0 9 Best Language: 1 10 Dysarthria: 1 11 Extinct. and Inatten.: 0 TOTAL: 9  Labs I have reviewed labs in epic and the results pertinent to this consultation are:  CBC    Component Value Date/Time   WBC 6.7 04/07/2020 2009   RBC 4.50 04/07/2020 2009   HGB 14.6 04/07/2020 2026   HGB 14.7 05/09/2019 0933   HGB 10.6 (L) 03/08/2017 1016   HCT 43.0 04/07/2020 2026   HCT 32.3 (L) 03/08/2017 1016   PLT 126 (L) 04/07/2020 2009   PLT 229 05/09/2019 0933   PLT 362 03/08/2017 1016   MCV 96.4 04/07/2020 2009   MCV 87.3 03/08/2017 1016   MCH 31.3 04/07/2020 2009   MCHC 32.5 04/07/2020 2009   RDW 15.0 04/07/2020 2009   RDW 15.6 (H) 03/08/2017 1016   LYMPHSABS 1.6 04/07/2020 2009   LYMPHSABS 1.4 03/08/2017 1016   MONOABS 0.9 04/07/2020 2009   MONOABS 1.1 (H) 03/08/2017 1016   EOSABS 0.1 04/07/2020 2009   EOSABS 0.2  03/08/2017 1016   BASOSABS 0.0 04/07/2020 2009   BASOSABS 0.0 03/08/2017 1016    CMP-potassium 7.6 Repeat potassium 4.2   Imaging I have reviewed the images obtained:  CT-scan of the brain: No acute abnormality MRI examination of the brain: 6 mm acute ischemic infarction in the left inferior frontal lobe cortex.  No other acute intracranial abnormality.  Underlying age-related atrophy with remote left thalamic lacunar infarct unchanged from prior.  Assessment:  86 year old woman presents with sudden onset of slurred speech and word finding difficulty as well as incomprehensible speech.  Last known well was sometime around  noon on 04/07/2020.  Symptoms had resolved at the time of presentation in the ER but on my assessment patient was very uncomfortable, moaning in pain and still somewhat aphasic. MRI of the brain showed a small left frontal cortical infarction which was acute and age-related atrophy along with remote left thalamic infarct. Her clinical picture is somewhat out of proportion to the small stroke. Her CMP showed a potassium of 7.6-unclear to me if this is true or spurious finding.  She was also extremely hypertensive with systolics in the 026 range when she presented. At this time, the appearance of the stroke appears suspicious for an embolic process hence further stroke work-up will be beneficial. In addition, a toxic metabolic work-up should also be completed to ensure that there are no other factors contributing to her presentation.  Impression: Acute ischemic stroke Hypertensive emergency Evaluate for underlying infection/toxic process  Recommendations: Admit for stroke work-up Frequent neurochecks Telemetry Allow for permissive hypertension-I would treat on a as needed basis if her systolics go over 378 for the next 48 hours or so.  Then I would gradually reduce the blood pressure with a goal at discharge of 140/90 Continue aspirin for now Continue statin for  now 2D echocardiogram CT angiogram head and neck-ordered A1c Lipid panel Check chest x-ray and urinalysis PT OT speech therapy N.p.o. until cleared by bedside swallow screen or formal swallow evaluation I suspect that she might have underlying atrial fibrillation given the very cortical nature of this infarct and if no atrial fibrillation is found during the hospital stay telemetry, I would recommend consideration for longer outpatient cardiac monitoring. I would also defer the medical management of hyperkalemia, whether it is true or spurious result -to the primary team.  In addition, I would recommend follow-up of the chest x-ray and urinalysis given that her clinical picture is somewhat out of proportion to the small size of the stroke.  Discussed my plan over the phone with the hospitalist Dr. Tonie Griffith  Stroke neurology will continue to follow with you  -- Amie Portland, MD [facility] Pager: [phone] If 7pm to 7am, please call on call as listed on AMION.   ADDENDUM CTA head/neck IMPRESSION: 1. Acute proximal left M3 occlusion, inferior division, in keeping with the previously identified small left MCA territory infarct. 2. Mild atheromatous change about the carotid bifurcations and carotid siphons without hemodynamically significant or correctable stenosis. 3. Mild diffuse interlobular septal thickening within the visualized lungs, suggesting mild pulmonary interstitial congestion/edema.   Recs as above. As noted prior, OSW for tPA. No ELVO that can be amenable for EVT.  -- Amie Portland, MD [facility] Pager: [phone] If 7pm to 7am, please call on call as listed on AMION.

## 2020-04-08 NOTE — ED Notes (Signed)
RN attempted to call pt son to let him know pt got a bed upstairs. No answer

## 2020-04-08 NOTE — Progress Notes (Signed)
Rehab Admissions Coordinator Note:  Patient was screened by Cleatrice Burke for appropriateness for an Inpatient Acute Rehab Consult per OT recs.   At this time, we are recommending Inpatient Rehab consult. I will place order per protocol.  Cleatrice Burke RN MSN 04/08/2020, 4:59 PM  I can be reached at [phone].

## 2020-04-08 NOTE — H&P (Addendum)
History and Physical    [name] IDP:[id number] DOB: 27-Aug-1928 DOA: 04/07/2020  PCP: Lajean Manes, MD   Patient coming from: Home  Chief Complaint: Slurred speech that son cannot understand  HPI: [name] is a 86 y.o. female with medical history significant for hypertension, CAD, hyperlipidemia, osteoarthritis presents to the hospital for evaluation of slurred speech.  She lives independently alone and has good normal function with self-care and can perform ADLs.  Her son speaks to her 2 times a day.  He is in the room with patient.  He states that he spoke to his mother around 12:30 in the afternoon this 23rd 2021 and she was her normal self.  When he spoke to her again later around 6 PM on the phone she had very garbled slurred speech that he was not able to understand she had difficulty finding words to say.  He contacted EMS and patient was brought to the hospital.  Does have a history of being hard of hearing.  She denies any numbness or tingling in her extremities and has not had any facial droop according to the son.  Son reports that her speech is improved from when he spoke to on the phone earlier and is almost back to baseline.  Still some mild slurring and seems to be going to find the right words to say.  Patient is complaining of some severe pain in her upper back that does not radiate.  She reports no chest pain, pressure, palpitations, nausea, vomiting, diarrhea.  She has had no recent fever, cough or rash. No tobacco, alcohol, illicit drug use.  ED Course: In the emergency room she is found to have elevated blood pressure of 165-250/78-210.  She been given 1 dose of hydralazine in the emergency room.  I have also ordered 0.5 mg of Dilaudid for her severe back pain.  Initially her potassium was reported as elevated at 7.6 but sample severely hemolyzed.  One sample was redrawn potassium was normal.  MRI was obtained which showed small left inferior frontal cortical infarct.   Review of Systems:  General: Denies weakness, fever, chills, weight loss, night sweats.  Denies dizziness.  Denies change in appetite HENT: Denies head trauma, headache, denies tinnitus.  Denies nasal congestion or bleeding.  Denies sore throat, sores in mouth.  Denies difficulty swallowing Eyes: Denies blurry vision, pain in eye, drainage.  Denies discoloration of eyes. Neck: Denies pain.  Denies swelling.  Denies pain with movement. Cardiovascular: Denies chest pain, palpitations.  Denies edema.  Denies orthopnea Respiratory: Denies shortness of breath, cough.  Denies wheezing.  Denies sputum production Gastrointestinal: Denies abdominal pain, swelling.  Denies nausea, vomiting, diarrhea.  Denies melena.  Denies hematemesis. Musculoskeletal: Denies limitation of movement.  Denies deformity or swelling.  Denies pain.  Denies arthralgias or myalgias. Genitourinary: Denies pelvic pain.  Denies urinary frequency or hesitancy.  Denies dysuria.  Skin: Denies rash.  Denies petechiae, purpura, ecchymosis. Neurological: Denies headache.  Denies syncope.  Denies seizure activity.  Denies weakness or paresthesia.  Denies drooping face.  Denies visual change. Psychiatric: Denies depression, anxiety.  Denies suicidal thoughts or ideation.  Denies hallucinations.  Past Medical History:  Diagnosis Date  . Arthritis   . CAD (coronary artery disease)    a. Mod prox LAD & first diagonal stenosis by cath 2002 - managed medically since that time with normal LV function.  . Cataract   . Hyperlipidemia   . Hypertension     Past Surgical  History:  Procedure Laterality Date  . ABDOMINAL HYSTERECTOMY    . APPENDECTOMY    . BACK SURGERY    . CARPAL TUNNEL RELEASE    . CHOLECYSTECTOMY N/A 06/19/2013   Procedure: LAPAROSCOPIC CHOLECYSTECTOMY;  Surgeon: Harl Bowie, MD;  Location: [facility];  Service: General;  Laterality: N/A;  . ESOPHAGOGASTRODUODENOSCOPY N/A 04/26/2017   Procedure:  ESOPHAGOGASTRODUODENOSCOPY (EGD) W/ DILATION;  Surgeon: Ronnette Juniper, MD;  Location: [facility];  Service: Gastroenterology;  Laterality: N/A;  . HERNIA REPAIR    . INSERTION OF MESH  05/19/2018   Procedure: INSERTION OF MESH;  Surgeon: Excell Seltzer, MD;  Location: [facility];  Service: General;;  . IR GENERIC HISTORICAL  03/18/2016   IR RADIOLOGIST EVAL & MGMT 03/18/2016 Corrie Mckusick, DO [facility] INTERV RAD  . KNEE SURGERY    . SHOULDER SURGERY    . SPLENECTOMY, TOTAL    . VENTRAL HERNIA REPAIR N/A 05/19/2018   Procedure: HERNIA REPAIR VENTRAL ADULT;  Surgeon: Excell Seltzer, MD;  Location: [facility];  Service: General;  Laterality: N/A;    Social History  reports that she has never smoked. She has never used smokeless tobacco. She reports that she does not drink alcohol and does not use drugs.  Allergies  Allergen Reactions  . Felodipine Anaphylaxis and Other (See Comments)    Reaction: unknown possible nausea or rash  . Prednisone Other (See Comments)    Unknown reaction  . Shrimp [Shellfish Allergy] Nausea And Vomiting  . Codeine   . Crestor [Rosuvastatin]   . Feldene [Piroxicam]   . Lescol [Fluvastatin]   . Lipitor [Atorvastatin]   . Nexium [Esomeprazole]   . Pravachol [Pravastatin]   . Prilosec Otc [Omeprazole Magnesium]   . Statins Other (See Comments)    Muscle/leg pain.  Earnestine Mealing [Colesevelam]   . Zetia [Ezetimibe]   . Zithromax [Azithromycin]   . Zocor [Simvastatin]   . Omeprazole Other (See Comments)    Reaction: unknown possible nausea or rash    Family History  Problem Relation Age of Onset  . Heart disease Mother        "enlarged valve"  . Cancer Mother        Ovarian  . Stroke Father   . Cancer Sister        Colon     Prior to Admission medications   Medication Sig Start Date End Date Taking? Authorizing Provider  acetaminophen (TYLENOL 8 HOUR ARTHRITIS PAIN) 650 MG CR tablet Take 650 mg by mouth every 8 (eight) hours as needed for pain.    Yes  [provider]  aspirin EC 81 MG tablet Take 81 mg by mouth daily.    Yes [provider]  lisinopril (ZESTRIL) 5 MG tablet Take 5 mg by mouth daily. 03/29/19  Yes [provider]  Multiple Vitamins-Minerals (PRESERVISION AREDS PO) Take 1 capsule by mouth daily.    Yes [provider]  Omega-3 Fatty Acids (FISH OIL PO) Take 1 capsule by mouth daily.    Yes [provider]  traZODone (DESYREL) 50 MG tablet Take 25 mg by mouth at bedtime as needed for sleep.  09/28/18  Yes [provider]    Physical Exam: Vitals:   04/07/20 2249 04/07/20 2300 04/08/20 0001 04/08/20 0005  BP: (!) 229/75 (!) 231/70 (!) 249/210 (!) 182/131  Pulse: 60 (!) 58    Resp: 14 17  16   Temp:      TempSrc:      SpO2: 97%  100%    Weight:      Height:        Constitutional: NAD, calm, comfortable Vitals:   04/07/20 2249 04/07/20 2300 04/08/20 0001 04/08/20 0005  BP: (!) 229/75 (!) 231/70 (!) 249/210 (!) 182/131  Pulse: 60 (!) 58    Resp: 14 17  16   Temp:      TempSrc:      SpO2: 97% 100%    Weight:      Height:       General: WDWN, Alert and oriented x3.  Eyes: EOMI, PERRL, lids and conjunctivae normal.  Sclera nonicteric HENT:  [place]/AT, external ears normal.  Hard of hearing.  Nares patent without epistasis.  Mucous membranes are moist. Posterior pharynx clear of any exudate or lesions. Neck: Soft, normal range of motion, supple, no masses, no thyromegaly.  Trachea midline Respiratory: clear to auscultation bilaterally, no wheezing, no crackles. Normal respiratory effort. No accessory muscle use.  Cardiovascular: Regular rate and rhythm, no murmurs / rubs / gallops. No extremity edema. 2+ pedal pulses. No carotid bruits.  Abdomen: Soft, no tenderness, nondistended, no rebound or guarding.  No masses palpated. No hepatosplenomegaly. Bowel sounds normoactive Musculoskeletal: FROM. no clubbing / cyanosis. No joint deformity upper and lower extremities. no  contractures. Normal muscle tone.  Skin: Warm, dry, intact no rashes, lesions, ulcers. No induration Neurologic: CN 2-12 grossly intact.  Mild slurring of speech and patient struggling to find the right words to say. sensation intact, patella DTR +1 bilaterally. Strength 4/5 in all extremities.  Babinski downgoing bilaterally.  No drift. Psychiatric: Normal judgment and insight.  Normal mood.    Labs on Admission: I have personally reviewed following labs and imaging studies  CBC: Recent Labs  Lab 04/07/20 2009 04/07/20 2026  WBC 6.7  --   NEUTROABS 4.1  --   HGB 14.1 14.6  HCT 43.4 43.0  MCV 96.4  --   PLT 126*  --     Basic Metabolic Panel: Recent Labs  Lab 04/07/20 2009 04/07/20 2026 04/07/20 2205  NA 137 135  --   K >7.5* 7.6* 4.2  CL 103 105  --   CO2 24  --   --   GLUCOSE 92 90  --   BUN 30* 51*  --   CREATININE 0.94 0.90  --   CALCIUM 9.5  --   --     GFR: Estimated Creatinine Clearance: 39.2 mL/min (by C-G formula based on SCr of 0.9 mg/dL).  Liver Function Tests: Recent Labs  Lab 04/07/20 2009  AST 60*  ALT 13  ALKPHOS 59  BILITOT 2.2*  PROT 5.5*  ALBUMIN 3.5    Urine analysis:    Component Value Date/Time   COLORURINE YELLOW 03/09/2019 1943   APPEARANCEUR CLEAR 03/09/2019 1943   LABSPEC 1.014 03/09/2019 1943   PHURINE 6.0 03/09/2019 1943   GLUCOSEU NEGATIVE 03/09/2019 1943   HGBUR NEGATIVE 03/09/2019 1943   BILIRUBINUR NEGATIVE 03/09/2019 1943   KETONESUR NEGATIVE 03/09/2019 1943   PROTEINUR NEGATIVE 03/09/2019 1943   UROBILINOGEN 0.2 08/02/2013 1342   NITRITE POSITIVE (A) 03/09/2019 1943   LEUKOCYTESUR SMALL (A) 03/09/2019 1943    Radiological Exams on Admission: CT HEAD WO CONTRAST  Result Date: 04/07/2020 CLINICAL DATA:  Acute neuro deficit.  Slurred speech EXAM: CT HEAD WITHOUT CONTRAST TECHNIQUE: Contiguous axial images were obtained from the base of the skull through the vertex without intravenous contrast. COMPARISON:  CT head  01/02/2020 FINDINGS: Brain: Mild cortical atrophy. Negative  for hydrocephalus. Chronic infarct left thalamus unchanged. Mild chronic ischemic changes in the white matter. Negative for acute infarct, hemorrhage, mass Vascular: Negative for hyperdense vessel Skull: Negative Sinuses/Orbits: Paranasal sinuses clear. Bilateral cataract extraction. Other: None IMPRESSION: No acute abnormality no change from the prior study. Electronically Signed   By: Franchot Gallo M.D.   On: 04/07/2020 20:30   MR BRAIN WO CONTRAST  Result Date: 04/07/2020 CLINICAL DATA:  Initial evaluation for neuro deficit, stroke suspected. EXAM: MRI HEAD WITHOUT CONTRAST TECHNIQUE: Multiplanar, multiecho pulse sequences of the brain and surrounding structures were obtained without intravenous contrast. COMPARISON:  Prior CT from earlier the same day. FINDINGS: Brain: Generalized age-related cerebral atrophy. Minimal T2/FLAIR hyperintensity within the periventricular deep white matter both cerebral hemispheres most consistent with chronic small vessel ischemic disease, minor in appearance, and felt to be within normal limits for age. Small remote lacunar infarct noted at the left thalamus. 6 mm focus of diffusion abnormality involving the cortical gray matter of the inferior left frontal lobe suspicious for a small acute ischemic infarct (series 5, image 76). No associated hemorrhage or mass effect. No other diffusion abnormality to suggest acute or subacute ischemia. Gray-white matter differentiation otherwise maintained. No evidence for acute or chronic intracranial hemorrhage. No mass lesion, midline shift or mass effect. No hydrocephalus or extra-axial fluid collection. Pituitary gland suprasellar region normal. Midline structures intact. Vascular: Major intracranial vascular flow voids are maintained. Skull and upper cervical spine: Craniocervical junction within normal limits. Bone marrow signal intensity normal. No scalp soft tissue  abnormality. Sinuses/Orbits: Patient status post bilateral ocular lens replacement. Paranasal sinuses are largely clear. No significant mastoid effusion. Inner ear structures grossly normal. Other: None. IMPRESSION: 1. 6 mm acute ischemic nonhemorrhagic cortical infarct involving the inferior left frontal lobe. 2. No other acute intracranial abnormality. 3. Underlying age-related cerebral atrophy, with remote left thalamic lacunar infarct. Electronically Signed   By: Jeannine Boga M.D.   On: 04/07/2020 23:30    EKG: Independently reviewed.  AG is reviewed and shows normal sinus rhythm with occasional PAC.  Normal QTc  Assessment/Plan Principal Problem:   Acute CVA (cerebrovascular accident) [facility]) Ms. Bottoms is found to have an acute CVA on the left inferior frontal lobe.  Neurology is consulted and is to see patient in the emergency room and he neurology states they will order CT angiography of head and neck for further evaluation. Consult PT/OT/ST for evaluation with acute stroke. Will be kept n.p.o. until passes dysphagia screen by nursing staff. Permissive hypertension for the next 24 hours blood pressure 220/110.  Patient currently has a blood pressure of 503-546 systolically and has been given 1 dose of hydralazine IV by the emergency room provider.  Continue to monitor blood pressure Obtain echocardiogram for further evaluation for stroke work-up.  Continue cardiac telemetry monitoring to make sure patient has no underlying paroxysmal A. fib contributing to stroke.  Noted the patient had a TIA a few months ago. Continue aspirin daily Check lipid panel.  Electrolytes and renal function be rechecked in the morning with labs  Active Problems:   Hypertension Lisinopril be held the next 24 hours for permissive hypertension over that time.  Then will work to decrease blood pressure to goal level and resume lisinopril after 24 hours.    CAD (coronary artery disease) Stable.  Denies any  chest pain or pressure.  EKG shows no acute ST changes.  Patient having severe upper back pain we will check serial troponins overnight her history of  heart disease    Thrombocytopenia ([place]) Patient with mild thrombocytopenia with platelet count 126,000.  We will continue to monitor CBC in morning    Upper back pain Ms. Hyer is complaining of pain in her upper back that does not radiate.  Given dose of Dilaudid in the emergency room now to help with pain    DVT prophylaxis: Padua score elevated.  Patient placed on Lovenox for DVT prophylaxis.  This was discussed with neurology who recommends a since patient is a small cortical infarct Lovenox would be appropriate for DVT prophylaxis and would not increase risk of hemorrhagic conversion. Code Status:   DNR.   DNR forms at bedside. Family Communication:  Diagnosis plan discussed with patient and her son who is at bedside.  Questions were answered.  Further recommended to follow as clinical indicated. Disposition Plan:   Patient is from:  Home  Anticipated DC to:  Home  Anticipated DC date:  Anticipate greater than 2 midnight stay in the hospital to treat acute medical condition  Anticipated DC barriers: If PT/OT identifies any rehab needs will need to evaluate if can be done by home health or will need skilled rehab services.  Consults called:  Neurology, Dr. Lorraine Lax Admission status:  Inpatient  Severity of Illness: The appropriate patient status for this patient is INPATIENT. Inpatient status is judged to be reasonable and necessary in order to provide the required intensity of service to ensure the patient's safety. The patient's presenting symptoms, physical exam findings, and initial radiographic and laboratory data in the context of their chronic comorbidities is felt to place them at high risk for further clinical deterioration. Furthermore, it is not anticipated that the patient will be medically stable for discharge from the hospital  within 2 midnights of admission. The following factors support the patient status of inpatient.    * I certify that at the point of admission it is my clinical judgment that the patient will require inpatient hospital care spanning beyond 2 midnights from the point of admission due to high intensity of service, high risk for further deterioration and high frequency of surveillance required.Yevonne Aline Chotiner MD [facility]  How to contact the [facility] Attending or Consulting provider [place] or covering provider during after hours [place], for this patient?   1. Check the care team in [facility] and look for a) attending/consulting TR[facility] provider listed and b) the [facility] team listed 2. Log into [email or web address] and use [place]'s universal password to access. If you do not have the password, please contact the hospital operator. 3. Locate the [facility] provider you are looking for under [facility] and page to a number that you can be directly reached. 4. If you still have difficulty reaching the provider, please page the [facility] (Director on Call) for the Hospitalists listed on amion for assistance.  04/08/2020, 12:24 AM     Addendum: Chest x-ray shows left lung base atelectasis versus early infiltrate.  Placed on empiric antibiotic to cover for possible pneumonia in clinical setting of her presentation.

## 2020-04-08 NOTE — Progress Notes (Signed)
04/08/20 1719  PT Visit Information  Last PT Received On 04/08/20  Assistance Needed +1 (2+ mobility progression )  History of Present Illness Patient is a 86 y/o female with PMH of HTN, OA, hearing loss, cataract, CAD,  presenting with slurred speech. 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   Precautions  Precautions Fall  Restrictions  Weight Bearing Restrictions No  Home Living  Family/patient expects to be discharged to: Private residence  Living Arrangements Alone  Available Help at Discharge Family;Available PRN/intermittently  Type of Home Independent living facility  Home Access Level entry  Home Layout One level  Bathroom Shower/Tub Walk-in Patent examiner - 4 wheels;Walker - 2 wheels;BSC  Additional Comments Lives at [facility]   Prior Function  Level of Independence Independent with assistive device(s)  Comments Pt's son reports pt was very independent and cooked 1 meal/day and performed ADLs. Used rollator or RW for mobility. 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   Communication  Communication Expressive difficulties;HOH  Pain Assessment  Pain Assessment Faces  Faces Pain Scale 0  Cognition  Arousal/Alertness Awake/alert (but fatigued )  Behavior During Therapy WFL for tasks assessed/performed  Overall Cognitive Status Impaired/Different from baseline  Area of Impairment Following commands  Following Commands Follows one step commands inconsistently  General Comments Limited by aphasia. Was able to follow some simple commands.   Difficult to assess due to Impaired communication  Upper Extremity Assessment  Upper Extremity Assessment Defer to OT evaluation  Lower Extremity Assessment  Lower Extremity Assessment Generalized weakness  Cervical / Trunk Assessment  Cervical / Trunk Assessment Kyphotic  Bed Mobility  Overal bed mobility Needs Assistance  Bed  Mobility Supine to Sit  Supine to sit Mod assist  General bed mobility comments Mod A for trunk elevation to come to sitting. Also required assist to scoot hips to EOB.   Transfers  Overall transfer level Needs assistance  Equipment used None  Transfers Sit to/from [employer] Transfers  Sit to Stand Mod assist  Stand pivot transfers Mod assist  General transfer comment Mod A for lift assist and steadying to stand and transfer to chair. PT stood in front of pt and had pt hold to PT arms. Required assist for weightshifting in order to take steps. Posterior lean noted.   Balance  Overall balance assessment Needs assistance  Sitting-balance support No upper extremity supported;Feet supported  Sitting balance-Leahy Scale Fair  Postural control Posterior lean  Standing balance support Bilateral upper extremity supported;During functional activity  Standing balance-Leahy Scale Poor  Standing balance comment Reliant on external support. Posterior lean noted.   General Comments  General comments (skin integrity, edema, etc.) son present during session   PT - End of Session  Equipment Utilized During Treatment Gait belt  Activity Tolerance Patient tolerated treatment well  Patient left in chair;with call bell/phone within reach;with chair alarm set  Nurse Communication Mobility status  PT Assessment  PT Recommendation/Assessment Patient needs continued PT services  PT Visit Diagnosis Unsteadiness on feet (R26.81);Muscle weakness (generalized) (M62.81);Difficulty in walking, not elsewhere classified (R26.2)  PT Problem List Decreased strength;Decreased balance;Decreased mobility;Decreased activity tolerance;Decreased knowledge of use of DME;Decreased knowledge of precautions  PT Plan  PT Frequency (ACUTE ONLY) Min 4X/week  PT Treatment/Interventions (ACUTE ONLY) DME instruction;Gait training;Therapeutic activities;Therapeutic exercise;Functional mobility training;Balance  training;Patient/family education;Cognitive remediation  AM-PAC PT "6 Clicks" Mobility Outcome Measure (Version 2)  Help needed turning from your back to your side while in a flat bed without using bedrails? 2  Help needed moving from lying on your back to sitting on the side of a flat bed without using bedrails? 2  Help needed moving to and from a bed to a chair (including a wheelchair)? 2  Help needed standing up from a chair using your arms (e.g., wheelchair or bedside chair)? 2  Help needed to walk in hospital room? 1  Help needed climbing 3-5 steps with a railing?  1  6 Click Score 10  Consider Recommendation of Discharge To: CIR/SNF/LTACH  PT Recommendation  Recommendations for Other Services Rehab consult  Follow Up Recommendations CIR  PT equipment Other (comment) (TBD)  Individuals Consulted  Consulted and Agree with Results and Recommendations Patient;Family member/caregiver  Family Member Consulted son  Acute Rehab PT Goals  Patient Stated Goal unable to state   PT Goal Formulation With patient  Time For Goal Achievement 04/22/20  Potential to Achieve Goals Good  PT Time Calculation  PT Start Time (ACUTE ONLY) 1208  PT Stop Time (ACUTE ONLY) 1229  PT Time Calculation (min) (ACUTE ONLY) 21 min  PT General Charges  $$ ACUTE PT VISIT 1 Visit  PT Evaluation  $PT Eval Moderate Complexity 1 Mod  Written Expression  Dominant Hand Right   Pt admitted secondary to problem above with deficits above. Pt requiring mod A to stand and transfer to chair this session. Pt with notable posterior lean and increased difficulty sequencing to take steps to chair. Pt also with notable expressive deficits. Per son, pt was previously mod I using rollator or RW. Feel pt is appropriate for CIR level therapies to increase independence and safety with functional mobility prior to return home. Will continue to follow acutely to maximize functional mobility independence and safety.   Reuel Derby, PT,  DPT  Acute Rehabilitation Services  Pager: [phone] Office: [phone]

## 2020-04-09 ENCOUNTER — Other Ambulatory Visit: Payer: Self-pay

## 2020-04-09 DIAGNOSIS — N390 Urinary tract infection, site not specified: Secondary | ICD-10-CM

## 2020-04-09 DIAGNOSIS — I248 Other forms of acute ischemic heart disease: Secondary | ICD-10-CM

## 2020-04-09 DIAGNOSIS — N1831 Chronic kidney disease, stage 3a: Secondary | ICD-10-CM

## 2020-04-09 DIAGNOSIS — E785 Hyperlipidemia, unspecified: Secondary | ICD-10-CM

## 2020-04-09 DIAGNOSIS — I10 Essential (primary) hypertension: Secondary | ICD-10-CM

## 2020-04-09 DIAGNOSIS — I25119 Atherosclerotic heart disease of native coronary artery with unspecified angina pectoris: Secondary | ICD-10-CM

## 2020-04-09 LAB — GLUCOSE, CAPILLARY: Glucose-Capillary: 108 mg/dL — ABNORMAL HIGH (ref 70–99)

## 2020-04-09 LAB — BASIC METABOLIC PANEL
Anion gap: 11 (ref 5–15)
BUN: 23 mg/dL (ref 8–23)
CO2: 23 mmol/L (ref 22–32)
Calcium: 9.1 mg/dL (ref 8.9–10.3)
Chloride: 102 mmol/L (ref 98–111)
Creatinine, Ser: 0.89 mg/dL (ref 0.44–1.00)
GFR calc Af Amer: >60 mL/min (ref >60)
GFR calc non Af Amer: 57 mL/min — ABNORMAL LOW (ref >60)
Glucose, Bld: 106 mg/dL — ABNORMAL HIGH (ref 70–99)
Potassium: 4.7 mmol/L (ref 3.5–5.1)
Sodium: 136 mmol/L (ref 135–145)

## 2020-04-09 LAB — CBC WITH DIFFERENTIAL/PLATELET
Abs Immature Granulocytes: 0.02 10*3/uL (ref 0.00–0.07)
Basophils Absolute: 0 10*3/uL (ref 0.0–0.1)
Basophils Relative: 0 %
Eosinophils Absolute: 0.1 10*3/uL (ref 0.0–0.5)
Eosinophils Relative: 1 %
HCT: 40.3 % (ref 36.0–46.0)
Hemoglobin: 13.5 g/dL (ref 12.0–15.0)
Immature Granulocytes: 0 %
Lymphocytes Relative: 21 %
Lymphs Abs: 1.6 10*3/uL (ref 0.7–4.0)
MCH: 31.8 pg (ref 26.0–34.0)
MCHC: 33.5 g/dL (ref 30.0–36.0)
MCV: 95 fL (ref 80.0–100.0)
Monocytes Absolute: 1 10*3/uL (ref 0.1–1.0)
Monocytes Relative: 14 %
Neutro Abs: 4.6 10*3/uL (ref 1.7–7.7)
Neutrophils Relative %: 64 %
Platelets: 162 10*3/uL (ref 150–400)
RBC: 4.24 MIL/uL (ref 3.87–5.11)
RDW: 14.9 % (ref 11.5–15.5)
WBC: 7.3 10*3/uL (ref 4.0–10.5)
nRBC: 0 % (ref 0.0–0.2)

## 2020-04-09 MED ORDER — LISINOPRIL 5 MG PO TABS
5.0000 mg | ORAL_TABLET | Freq: Every day | ORAL | Status: DC
  Administered 2020-04-09: 5 mg via ORAL
  Filled 2020-04-09: qty 1

## 2020-04-09 MED ORDER — LISINOPRIL 10 MG PO TABS
10.0000 mg | ORAL_TABLET | Freq: Every day | ORAL | Status: DC
  Administered 2020-04-10: 10 mg via ORAL
  Filled 2020-04-09: qty 1

## 2020-04-09 MED ORDER — LABETALOL HCL 5 MG/ML IV SOLN
10.0000 mg | INTRAVENOUS | Status: DC | PRN
  Administered 2020-04-10: 10 mg via INTRAVENOUS
  Filled 2020-04-09: qty 4

## 2020-04-09 MED ORDER — HYDRALAZINE HCL 20 MG/ML IJ SOLN
10.0000 mg | Freq: Four times a day (QID) | INTRAMUSCULAR | Status: DC | PRN
  Administered 2020-04-09 – 2020-04-10 (×2): 10 mg via INTRAVENOUS
  Filled 2020-04-09 (×2): qty 1

## 2020-04-09 MED ORDER — SODIUM CHLORIDE 0.9 % IV SOLN
1.0000 g | INTRAVENOUS | Status: DC
  Administered 2020-04-09 – 2020-04-10 (×2): 1 g via INTRAVENOUS
  Filled 2020-04-09 (×2): qty 10

## 2020-04-09 NOTE — Progress Notes (Addendum)
Inpatient Rehabilitation Admissions Coordinator  I met at bedside with patient for assessment. I spoke with her son by phone to discuss goals and expectations of an inpt rehab admit vs SNF. She has lived at [place] [facility] at [facility] for 28 years. She has utilized [facility] SNF rehab multiple times for rehab . He will discuss with her preference for rehab venue and let me know tomorrow venue choice. Likely SNF at [facility].  Danne Baxter, RN, MSN Rehab Admissions Coordinator [phone] 04/09/2020 3:11 PM

## 2020-04-09 NOTE — Progress Notes (Signed)
PROGRESS NOTE    [name]  DDU:[id number] DOB: 1929/03/13 DOA: 04/07/2020 PCP: Lajean Manes, MD   Brief Narrative:  [name] is a 86 year old female with medical history significant for HTN, CAD, HLD who presented to the hospital on 8/23 with new onset slurred speech witnessed by her son and was found to have elevated blood pressure of 240/83 and 6 mm acute ischemic nonhemorrhagic cortical infarct of the inferior left frontal lobe.  Patient was evaluated by neurology who recommended aspirin, Plavix, further imaging for stroke work-up including TTE and CTA head and neck as well as further work-up for potential metabolic abnormalities.  Assessment & Plan:   Principal Problem:   Acute CVA (cerebrovascular accident) ([place]) Active Problems:   Hypertension   CAD (coronary artery disease)   Upper back pain   Thrombocytopenia (HCC)   Dyslipidemia   Stage 3a chronic kidney disease   Demand ischemia ([place])   Acute ischemic CVA: Left frontal lobe cortical small infarct, embolic pattern, secondary to unknown source.  Suspicious for atrial fibrillation.  CT head unremarkable.  MRI with left frontal lobe cortical infarct.  Old left thalamic lacunar infarct.  CT angiogram of head and neck shows acute proximal left M3 occlusion.  Seen by neurology yesterday and they recommended continuing DAPT with aspirin and Plavix for 3 weeks and then Plavix alone as she was on aspirin before.  PT OT has seen her and they recommended CIR.  CIR working on [employer] authorization for her.  Her blood pressure still is significantly elevated.  Now she has been at least 48 hours out of her initial symptoms.  Will resume her home dose of lisinopril.  Reduce parameters for labetalol 2 treat for systolic more than 237 or diastolic more than 628.  We will add as needed hydralazine with same parameters as well.  Cardiology to arrange outpatient 30-day event monitor.  CAP//atelectasis: I do not think  patient has any pneumonia.  She was afebrile with no leukocytosis.  Chest x-ray shows atelectasis.  I will discontinue antibiotics.  UTI: UA consistent with UTI however it is very hard to determine as patient is so hard of hearing that she cannot tell us if she has any symptoms.  Will follow urine culture.  Continue Rocephin.  Noted cardiomegaly and pulmonary edema on chest x-ray.  Likely consistent with acute on chronic diastolic CHF (last TTE in 2020).  No obvious peripheral edema, no current O2 requirements.  Received IV Lasix yesterday.  She is euvolemic.  No more diuretics at least today.  Elevated high-sensitivity troponin: Seen by cardiology.  Suspect demand ischemia in the setting of acute CVA, hypertensive emergency, CAP/UTI.  No ischemic changes on EKG. no wall motion abnormality on echo.  DVT prophylaxis: enoxaparin (LOVENOX) injection 40 mg Start: 04/08/20 1000   Code Status: DNR  Family Communication:  None present at bedside.    Status is: Inpatient  Remains inpatient appropriate because:Inpatient level of care appropriate due to severity of illness   Dispo: The patient is from: Home              Anticipated d/c is to: CIR              Anticipated d/c date is: 1 day              Patient currently is not medically stable to d/c.        Estimated body mass index is 25.03 kg/m as calculated from the following:  Height as of this encounter: 5\' 3"  (1.6 m).   Weight as of this encounter: 64.1 kg.  Pressure Injury 05/21/18 Stage II -  Partial thickness loss of dermis presenting as a shallow open ulcer with a red, pink wound bed without slough. (Active)  05/21/18 1052  Location: Buttocks  Location Orientation: Left  Staging: Stage II -  Partial thickness loss of dermis presenting as a shallow open ulcer with a red, pink wound bed without slough.  Wound Description (Comments):   Present on Admission:      Pressure Injury 05/21/18 Stage II -  Partial thickness loss of  dermis presenting as a shallow open ulcer with a red, pink wound bed without slough. (Active)  05/21/18 1052  Location: Buttocks  Location Orientation:   Staging: Stage II -  Partial thickness loss of dermis presenting as a shallow open ulcer with a red, pink wound bed without slough.  Wound Description (Comments):   Present on Admission:      Nutritional status:               Consultants:   Neurology  Procedures:   None  Antimicrobials:  Anti-infectives (From admission, onward)   Start     Dose/Rate Route Frequency Ordered Stop   04/08/20 0600  doxycycline (VIBRAMYCIN) 100 mg in sodium chloride 0.9 % 250 mL IVPB        100 mg 125 mL/hr over 120 Minutes Intravenous Every 12 hours 04/08/20 0317     04/08/20 0400  cefTRIAXone (ROCEPHIN) 1 g in sodium chloride 0.9 % 100 mL IVPB        1 g 200 mL/hr over 30 Minutes Intravenous Every 24 hours 04/08/20 0317           Subjective: Seen and examined.  Patient very hard of hearing however she denied having any complaint.  Objective: Vitals:   04/09/20 0306 04/09/20 0757 04/09/20 1116 04/09/20 1551  BP: (!) 171/63 (!) 193/75 (!) 182/66 (!) 205/99  Pulse: 60 66 (!) 59 66  Resp: 16 16 18 18   Temp: 98.7 F (37.1 C) 98 F (36.7 C) 98.7 F (37.1 C) 98.3 F (36.8 C)  TempSrc: Oral Oral Oral Oral  SpO2: 96% 93% 95% 97%  Weight:      Height:        Intake/Output Summary (Last 24 hours) at 04/09/2020 1631 Last data filed at 04/09/2020 0618 Gross per 24 hour  Intake 2561.08 ml  Output --  Net 2561.08 ml   Filed Weights   04/07/20 2138 04/08/20 0257  Weight: 67.9 kg 64.1 kg    Examination:  General exam: Appears calm and comfortable  Respiratory system: Clear to auscultation. Respiratory effort normal. Cardiovascular system: S1 & S2 heard, RRR. No JVD, murmurs, rubs, gallops or clicks. No pedal edema. Gastrointestinal system: Abdomen is nondistended, soft and nontender. No organomegaly or masses felt. Normal  bowel sounds heard. Central nervous system: Alert and oriented. No focal neurological deficits. Extremities: Symmetric 5 x 5 power. Skin: No rashes, lesions or ulcers  Data Reviewed: I have personally reviewed following labs and imaging studies  CBC: Recent Labs  Lab 04/07/20 2009 04/07/20 2026 04/08/20 0741 04/09/20 0149  WBC 6.7  --  8.9 7.3  NEUTROABS 4.1  --   --  4.6  HGB 14.1 14.6 13.9 13.5  HCT 43.4 43.0 41.6 40.3  MCV 96.4  --  95.2 95.0  PLT 126*  --  194 160   Basic Metabolic Panel: Recent Labs  Lab 04/07/20 2009 04/07/20 2026 04/07/20 2205 04/08/20 0741 04/09/20 0149  NA 137 135  --  139 136  K >7.5* 7.6* 4.2 3.9 4.7  CL 103 105  --  104 102  CO2 24  --   --  25 23  GLUCOSE 92 90  --  136* 106*  BUN 30* 51*  --  22 23  CREATININE 0.94 0.90  --  0.83 0.89  CALCIUM 9.5  --   --  9.5 9.1   GFR: Estimated Creatinine Clearance: 38.6 mL/min (by C-G formula based on SCr of 0.89 mg/dL). Liver Function Tests: Recent Labs  Lab 04/07/20 2009 04/08/20 0741  AST 60* 25  ALT 13 20  ALKPHOS 59 55  BILITOT 2.2* 0.8  PROT 5.5* 5.4*  ALBUMIN 3.5 3.3*   No results for input(s): LIPASE, AMYLASE in the last 168 hours. No results for input(s): AMMONIA in the last 168 hours. Coagulation Profile: Recent Labs  Lab 04/07/20 2009  INR 1.0   Cardiac Enzymes: No results for input(s): CKTOTAL, CKMB, CKMBINDEX, TROPONINI in the last 168 hours. BNP (last 3 results) No results for input(s): PROBNP in the last 8760 hours. HbA1C: Recent Labs    04/08/20 0741  HGBA1C 6.2*   CBG: Recent Labs  Lab 04/08/20 0619  GLUCAP 136*   Lipid Profile: Recent Labs    04/08/20 0135  CHOL 226*  HDL 65  LDLCALC 144*  TRIG 83  CHOLHDL 3.5   Thyroid Function Tests: No results for input(s): TSH, T4TOTAL, FREET4, T3FREE, THYROIDAB in the last 72 hours. Anemia Panel: No results for input(s): VITAMINB12, FOLATE, FERRITIN, TIBC, IRON, RETICCTPCT in the last 72 hours. Sepsis  Labs: No results for input(s): PROCALCITON, LATICACIDVEN in the last 168 hours.  Recent Results (from the past 240 hour(s))  SARS Coronavirus 2 by RT PCR (hospital order, performed in [facility] hospital lab) Nasopharyngeal Nasopharyngeal Swab     Status: None   Collection Time: 04/07/20  8:09 PM   Specimen: Nasopharyngeal Swab  Result Value Ref Range Status   SARS Coronavirus 2 NEGATIVE NEGATIVE Final    Comment: (NOTE) SARS-CoV-2 target nucleic acids are NOT DETECTED.  The SARS-CoV-2 RNA is generally detectable in upper and lower respiratory specimens during the acute phase of infection. The lowest concentration of SARS-CoV-2 viral copies this assay can detect is 250 copies / mL. A negative result does not preclude SARS-CoV-2 infection and should not be used as the sole basis for treatment or other patient management decisions.  A negative result may occur with improper specimen collection / handling, submission of specimen other than nasopharyngeal swab, presence of viral mutation(s) within the areas targeted by this assay, and inadequate number of viral copies (<250 copies / mL). A negative result must be combined with clinical observations, patient history, and epidemiological information.  Fact Sheet for Patients:   StrictlyIdeas.no  Fact Sheet for Healthcare Providers: BankingDealers.co.za  This test is not yet approved or  cleared by the [place] FDA and has been authorized for detection and/or diagnosis of SARS-CoV-2 by FDA under an Emergency Use Authorization (EUA).  This EUA will remain in effect (meaning this test can be used) for the duration of the COVID-19 declaration under Section 564(b)(1) of the Act, 21 U.S.C. section 360bbb-3(b)(1), unless the authorization is terminated or revoked sooner.  Performed at [facility], [place] [address] [place], [place] [address]       Radiology Studies: CT ANGIO  HEAD W OR WO  CONTRAST  Result Date: 04/08/2020 CLINICAL DATA:  Follow-up examination for acute stroke. EXAM: CT ANGIOGRAPHY HEAD AND NECK TECHNIQUE: Multidetector CT imaging of the head and neck was performed using the standard protocol during bolus administration of intravenous contrast. Multiplanar CT image reconstructions and MIPs were obtained to evaluate the vascular anatomy. Carotid stenosis measurements (when applicable) are obtained utilizing NASCET criteria, using the distal internal carotid diameter as the denominator. CONTRAST:  24mL OMNIPAQUE IOHEXOL 350 MG/ML SOLN COMPARISON:  Prior studies from 04/07/2020 FINDINGS: CTA NECK FINDINGS Aortic arch: Visualized aortic arch of normal caliber with normal 3 vessel morphology. Mild atheromatous change about the arch and origin of the great vessels without hemodynamically significant stenosis. Right carotid system: Right CCA widely patent from its origin to the bifurcation without stenosis. Mild atheromatous change about the right bifurcation without significant stenosis. Right ICA mildly tortuous and medialized into the retropharyngeal space but is widely patent to the skull base without stenosis, dissection or occlusion. Left carotid system: Left CCA patent from its origin to the bifurcation without flow-limiting stenosis. Mild atheromatous change about the origin of the left ICA without significant stenosis. Left ICA mildly tortuous and medialized into the retropharyngeal space but is widely patent to the skull base without stenosis, dissection or occlusion. Vertebral arteries: Both vertebral arteries arise from the subclavian arteries. Right vertebral artery dominant. Vertebral arteries widely patent without stenosis, dissection or occlusion. Skeleton: No definite acute osseous abnormality. Bones are somewhat diffusely sclerotic in appearance without discrete lytic or blastic osseous lesion Other neck: No other acute soft tissue abnormality within the  neck. Few scattered thyroid nodules noted, largest of which measures 7 mm on the right. Findings felt to be of doubtful significance given size and patient age. No follow-up imaging recommended regarding these lesions. No other mass lesion or adenopathy. Upper chest: Mild scattered interlobular septal thickening noted within the visualized lungs, suggesting underlying mild pulmonary interstitial congestion. Mild irregular biapical pleuroparenchymal thickening/scarring noted. Visualized upper chest demonstrates no other acute finding. Review of the MIP images confirms the above findings CTA HEAD FINDINGS Anterior circulation: Petrous segments patent bilaterally. Mild scattered atheromatous change within the cavernous/supraclinoid ICAs without hemodynamically significant stenosis. ICA termini well perfused. A1 segments patent bilaterally. Normal anterior communicating artery complex. Anterior cerebral arteries patent to their distal aspects without stenosis. No M1 stenosis or occlusion. Normal MCA bifurcations. On the left, there is a proximal left M3 occlusion, inferior division, in keeping with the previously identified small left MCA territory infarct (series 7, image 95). Otherwise, MCA branches well perfused and symmetric. Posterior circulation: Mild focal non stenotic plaque noted within the proximal right V4 segment. Vertebral arteries otherwise widely patent without stenosis. Both picas patent. Basilar patent to its distal aspect without stenosis. Superior cerebral arteries patent bilaterally. Left PCA supplied via the basilar. Fetal type origin of the right PCA. Both PCAs well perfused to their distal aspects without stenosis. Venous sinuses: Grossly patent allowing for timing the contrast bolus. Anatomic variants: Fetal type origin of the right PCA. No intracranial aneurysm. Review of the MIP images confirms the above findings IMPRESSION: 1. Acute proximal left M3 occlusion, inferior division, in keeping  with the previously identified small left MCA territory infarct. 2. Mild atheromatous change about the carotid bifurcations and carotid siphons without hemodynamically significant or correctable stenosis. 3. Mild diffuse interlobular septal thickening within the visualized lungs, suggesting mild pulmonary interstitial congestion/edema. Electronically Signed   By: Jeannine Boga M.D.   On: 04/08/2020 01:57   DG  Chest 2 View  Result Date: 04/08/2020 CLINICAL DATA:  86 year old female with slurred speech. EXAM: CHEST - 2 VIEW COMPARISON:  Chest radiograph dated 03/09/2019. FINDINGS: There is cardiomegaly with mild vascular congestion. Left lung base density may represent atelectasis or infiltrate. The right lung is clear. No pleural effusion or pneumothorax. There is a moderate size hiatal hernia. No acute osseous pathology. IMPRESSION: 1. Cardiomegaly with mild vascular congestion. 2. Left lung base atelectasis versus infiltrate. 3. Moderate size hiatal hernia. Electronically Signed   By: Anner Crete M.D.   On: 04/08/2020 01:10   CT HEAD WO CONTRAST  Result Date: 04/07/2020 CLINICAL DATA:  Acute neuro deficit.  Slurred speech EXAM: CT HEAD WITHOUT CONTRAST TECHNIQUE: Contiguous axial images were obtained from the base of the skull through the vertex without intravenous contrast. COMPARISON:  CT head 01/02/2020 FINDINGS: Brain: Mild cortical atrophy. Negative for hydrocephalus. Chronic infarct left thalamus unchanged. Mild chronic ischemic changes in the white matter. Negative for acute infarct, hemorrhage, mass Vascular: Negative for hyperdense vessel Skull: Negative Sinuses/Orbits: Paranasal sinuses clear. Bilateral cataract extraction. Other: None IMPRESSION: No acute abnormality no change from the prior study. Electronically Signed   By: Franchot Gallo M.D.   On: 04/07/2020 20:30   CT ANGIO NECK W OR WO CONTRAST  Result Date: 04/08/2020 CLINICAL DATA:  Follow-up examination for acute stroke.  EXAM: CT ANGIOGRAPHY HEAD AND NECK TECHNIQUE: Multidetector CT imaging of the head and neck was performed using the standard protocol during bolus administration of intravenous contrast. Multiplanar CT image reconstructions and MIPs were obtained to evaluate the vascular anatomy. Carotid stenosis measurements (when applicable) are obtained utilizing NASCET criteria, using the distal internal carotid diameter as the denominator. CONTRAST:  31mL OMNIPAQUE IOHEXOL 350 MG/ML SOLN COMPARISON:  Prior studies from 04/07/2020 FINDINGS: CTA NECK FINDINGS Aortic arch: Visualized aortic arch of normal caliber with normal 3 vessel morphology. Mild atheromatous change about the arch and origin of the great vessels without hemodynamically significant stenosis. Right carotid system: Right CCA widely patent from its origin to the bifurcation without stenosis. Mild atheromatous change about the right bifurcation without significant stenosis. Right ICA mildly tortuous and medialized into the retropharyngeal space but is widely patent to the skull base without stenosis, dissection or occlusion. Left carotid system: Left CCA patent from its origin to the bifurcation without flow-limiting stenosis. Mild atheromatous change about the origin of the left ICA without significant stenosis. Left ICA mildly tortuous and medialized into the retropharyngeal space but is widely patent to the skull base without stenosis, dissection or occlusion. Vertebral arteries: Both vertebral arteries arise from the subclavian arteries. Right vertebral artery dominant. Vertebral arteries widely patent without stenosis, dissection or occlusion. Skeleton: No definite acute osseous abnormality. Bones are somewhat diffusely sclerotic in appearance without discrete lytic or blastic osseous lesion Other neck: No other acute soft tissue abnormality within the neck. Few scattered thyroid nodules noted, largest of which measures 7 mm on the right. Findings felt to be  of doubtful significance given size and patient age. No follow-up imaging recommended regarding these lesions. No other mass lesion or adenopathy. Upper chest: Mild scattered interlobular septal thickening noted within the visualized lungs, suggesting underlying mild pulmonary interstitial congestion. Mild irregular biapical pleuroparenchymal thickening/scarring noted. Visualized upper chest demonstrates no other acute finding. Review of the MIP images confirms the above findings CTA HEAD FINDINGS Anterior circulation: Petrous segments patent bilaterally. Mild scattered atheromatous change within the cavernous/supraclinoid ICAs without hemodynamically significant stenosis. ICA termini well perfused. A1 segments  patent bilaterally. Normal anterior communicating artery complex. Anterior cerebral arteries patent to their distal aspects without stenosis. No M1 stenosis or occlusion. Normal MCA bifurcations. On the left, there is a proximal left M3 occlusion, inferior division, in keeping with the previously identified small left MCA territory infarct (series 7, image 95). Otherwise, MCA branches well perfused and symmetric. Posterior circulation: Mild focal non stenotic plaque noted within the proximal right V4 segment. Vertebral arteries otherwise widely patent without stenosis. Both picas patent. Basilar patent to its distal aspect without stenosis. Superior cerebral arteries patent bilaterally. Left PCA supplied via the basilar. Fetal type origin of the right PCA. Both PCAs well perfused to their distal aspects without stenosis. Venous sinuses: Grossly patent allowing for timing the contrast bolus. Anatomic variants: Fetal type origin of the right PCA. No intracranial aneurysm. Review of the MIP images confirms the above findings IMPRESSION: 1. Acute proximal left M3 occlusion, inferior division, in keeping with the previously identified small left MCA territory infarct. 2. Mild atheromatous change about the carotid  bifurcations and carotid siphons without hemodynamically significant or correctable stenosis. 3. Mild diffuse interlobular septal thickening within the visualized lungs, suggesting mild pulmonary interstitial congestion/edema. Electronically Signed   By: Jeannine Boga M.D.   On: 04/08/2020 01:57   MR BRAIN WO CONTRAST  Result Date: 04/07/2020 CLINICAL DATA:  Initial evaluation for neuro deficit, stroke suspected. EXAM: MRI HEAD WITHOUT CONTRAST TECHNIQUE: Multiplanar, multiecho pulse sequences of the brain and surrounding structures were obtained without intravenous contrast. COMPARISON:  Prior CT from earlier the same day. FINDINGS: Brain: Generalized age-related cerebral atrophy. Minimal T2/FLAIR hyperintensity within the periventricular deep white matter both cerebral hemispheres most consistent with chronic small vessel ischemic disease, minor in appearance, and felt to be within normal limits for age. Small remote lacunar infarct noted at the left thalamus. 6 mm focus of diffusion abnormality involving the cortical gray matter of the inferior left frontal lobe suspicious for a small acute ischemic infarct (series 5, image 76). No associated hemorrhage or mass effect. No other diffusion abnormality to suggest acute or subacute ischemia. Gray-white matter differentiation otherwise maintained. No evidence for acute or chronic intracranial hemorrhage. No mass lesion, midline shift or mass effect. No hydrocephalus or extra-axial fluid collection. Pituitary gland suprasellar region normal. Midline structures intact. Vascular: Major intracranial vascular flow voids are maintained. Skull and upper cervical spine: Craniocervical junction within normal limits. Bone marrow signal intensity normal. No scalp soft tissue abnormality. Sinuses/Orbits: Patient status post bilateral ocular lens replacement. Paranasal sinuses are largely clear. No significant mastoid effusion. Inner ear structures grossly normal.  Other: None. IMPRESSION: 1. 6 mm acute ischemic nonhemorrhagic cortical infarct involving the inferior left frontal lobe. 2. No other acute intracranial abnormality. 3. Underlying age-related cerebral atrophy, with remote left thalamic lacunar infarct. Electronically Signed   By: Jeannine Boga M.D.   On: 04/07/2020 23:30   ECHOCARDIOGRAM COMPLETE  Result Date: 04/08/2020    ECHOCARDIOGRAM REPORT   Patient Name:   ALEKSIA FREIMAN Date of Exam: 04/08/2020 Medical Rec #:  [id number]      Height:       63.0 in Accession #:    [id number]     Weight:       141.3 lb Date of Birth:  30-Jan-1929      BSA:          1.668 m Patient Age:    36 years       BP:  163/65 mmHg Patient Gender: F              HR:           56 bpm. Exam Location:  Inpatient Procedure: 2D Echo, 3D Echo and Strain Analysis Indications:    Stroke 434.91 / I163.9  History:        Patient has prior history of Echocardiogram examinations, most                 recent 03/10/2019. CAD; Risk Factors:Hypertension and                 Dyslipidemia.  Sonographer:    Darlina Sicilian RDCS Referring Phys: [id number] Powers  1. Left ventricular ejection fraction, by estimation, is 60 to 65%. The left ventricle has normal function. The left ventricle has no regional wall motion abnormalities. There is mild concentric left ventricular hypertrophy. Left ventricular diastolic parameters are consistent with Grade I diastolic dysfunction (impaired relaxation). Elevated left ventricular end-diastolic pressure. The average left ventricular global longitudinal strain is -18.7 %. The global longitudinal strain is normal.  2. Right ventricular systolic function is normal. The right ventricular size is normal. There is normal pulmonary artery systolic pressure.  3. Left atrial size was severely dilated.  4. The mitral valve is normal in structure. Trivial mitral valve regurgitation. No evidence of mitral stenosis.  5. Tricuspid valve regurgitation  is mild to moderate.  6. The aortic valve is tricuspid. Aortic valve regurgitation is not visualized. No aortic stenosis is present.  7. The inferior vena cava is normal in size with <50% respiratory variability, suggesting right atrial pressure of 8 mmHg. FINDINGS  Left Ventricle: Left ventricular ejection fraction, by estimation, is 60 to 65%. The left ventricle has normal function. The left ventricle has no regional wall motion abnormalities. The average left ventricular global longitudinal strain is -18.7 %. The global longitudinal strain is normal. The left ventricular internal cavity size was normal in size. There is mild concentric left ventricular hypertrophy. Left ventricular diastolic parameters are consistent with Grade I diastolic dysfunction (impaired relaxation). Elevated left ventricular end-diastolic pressure. Right Ventricle: The right ventricular size is normal. No increase in right ventricular wall thickness. Right ventricular systolic function is normal. There is normal pulmonary artery systolic pressure. The tricuspid regurgitant velocity is 2.50 m/s, and  with an assumed right atrial pressure of 8 mmHg, the estimated right ventricular systolic pressure is 36.6 mmHg. Left Atrium: Left atrial size was severely dilated. Right Atrium: Right atrial size was normal in size. Pericardium: There is no evidence of pericardial effusion. Mitral Valve: The mitral valve is normal in structure. Normal mobility of the mitral valve leaflets. Moderate mitral annular calcification. Trivial mitral valve regurgitation. No evidence of mitral valve stenosis. Tricuspid Valve: The tricuspid valve is normal in structure. Tricuspid valve regurgitation is mild to moderate. No evidence of tricuspid stenosis. Aortic Valve: The aortic valve is tricuspid. . There is moderate thickening and moderate calcification of the aortic valve. Aortic valve regurgitation is not visualized. No aortic stenosis is present. There is moderate  thickening of the aortic valve. There is moderate calcification of the aortic valve. Pulmonic Valve: The pulmonic valve was normal in structure. Pulmonic valve regurgitation is mild. No evidence of pulmonic stenosis. Aorta: The aortic root is normal in size and structure. Venous: The inferior vena cava is normal in size with less than 50% respiratory variability, suggesting right atrial pressure of 8 mmHg. IAS/Shunts: No atrial level shunt  detected by color flow Doppler.  LEFT VENTRICLE PLAX 2D LVIDd:         3.30 cm  Diastology LVIDs:         1.90 cm  LV e' lateral:   4.68 cm/s LV PW:         1.20 cm  LV E/e' lateral: 18.1 LV IVS:        1.10 cm  LV e' medial:    5.66 cm/s LVOT diam:     1.80 cm  LV E/e' medial:  14.9 LV SV:         54 LV SV Index:   32       2D Longitudinal Strain LVOT Area:     2.54 cm 2D Strain GLS (A2C):   -18.1 %                         2D Strain GLS (A3C):   -19.2 %                         2D Strain GLS (A4C):   -18.8 %                         2D Strain GLS Avg:     -18.7 %                          3D Volume EF:                         3D EF:        62 %                         LV EDV:       101 ml                         LV ESV:       38 ml                         LV SV:        63 ml RIGHT VENTRICLE TAPSE (M-mode): 2.8 cm LEFT ATRIUM             Index       RIGHT ATRIUM           Index LA diam:        3.60 cm 2.16 cm/m  RA Area:     18.50 cm LA Vol (A2C):   65.8 ml 39.44 ml/m RA Volume:   47.50 ml  28.47 ml/m LA Vol (A4C):   66.1 ml 39.62 ml/m LA Biplane Vol: 66.9 ml 40.10 ml/m  AORTIC VALVE LVOT Vmax:   117.00 cm/s LVOT Vmean:  78.300 cm/s LVOT VTI:    0.211 m  AORTA Ao Root diam: 2.80 cm Ao Asc diam:  3.20 cm MITRAL VALVE                TRICUSPID VALVE MV Area (PHT): 2.60 cm     TR Peak grad:   25.0 mmHg MV Decel Time: 292 msec     TR Vmax:        250.00 cm/s MV E velocity: 84.50 cm/s MV A velocity: 128.00 cm/s  SHUNTS MV E/A ratio:  0.66         Systemic VTI:  0.21 m                              Systemic Diam: 1.80 cm Skeet Latch MD Electronically signed by Skeet Latch MD Signature Date/Time: 04/08/2020/4:41:14 PM    Final    VAS [place] LOWER EXTREMITY VENOUS (DVT)  Result Date: 04/08/2020  Lower Venous DVTStudy Indications: Stroke.  Limitations: Pain. Performing Technologist: Antonieta Pert RDMS, RVT  Examination Guidelines: A complete evaluation includes B-mode imaging, spectral Doppler, color Doppler, and power Doppler as needed of all accessible portions of each vessel. Bilateral testing is considered an integral part of a complete examination. Limited examinations for reoccurring indications may be performed as noted. The reflux portion of the exam is performed with the patient in reverse Trendelenburg.  +---------+---------------+---------+-----------+----------+--------------+ RIGHT    CompressibilityPhasicitySpontaneityPropertiesThrombus Aging +---------+---------------+---------+-----------+----------+--------------+ CFV      Full           Yes      Yes                                 +---------+---------------+---------+-----------+----------+--------------+ SFJ      Full                                                        +---------+---------------+---------+-----------+----------+--------------+ FV Prox  Full                                                        +---------+---------------+---------+-----------+----------+--------------+ FV Mid   Full                                                        +---------+---------------+---------+-----------+----------+--------------+ FV DistalFull                                                        +---------+---------------+---------+-----------+----------+--------------+ PFV      Full                                                        +---------+---------------+---------+-----------+----------+--------------+ POP      Full           Yes      Yes                                  +---------+---------------+---------+-----------+----------+--------------+ PTV      Full                                                        +---------+---------------+---------+-----------+----------+--------------+  PERO     Full                                                        +---------+---------------+---------+-----------+----------+--------------+ GSV      Full                                                        +---------+---------------+---------+-----------+----------+--------------+ Hypoechoic fliud collection seen mid thigh aprox 6.4 x 3.1 x 1.7cm  +---------+---------------+---------+-----------+----------+--------------+ LEFT     CompressibilityPhasicitySpontaneityPropertiesThrombus Aging +---------+---------------+---------+-----------+----------+--------------+ CFV      Full           Yes      Yes                                 +---------+---------------+---------+-----------+----------+--------------+ SFJ      Full                                                        +---------+---------------+---------+-----------+----------+--------------+ FV Prox  Full                                                        +---------+---------------+---------+-----------+----------+--------------+ FV Mid   Full                                                        +---------+---------------+---------+-----------+----------+--------------+ FV DistalFull                                                        +---------+---------------+---------+-----------+----------+--------------+ PFV      Full                                                        +---------+---------------+---------+-----------+----------+--------------+ POP      Full           Yes      Yes                                 +---------+---------------+---------+-----------+----------+--------------+ PTV      Full                                                         +---------+---------------+---------+-----------+----------+--------------+  PERO     Full                                                        +---------+---------------+---------+-----------+----------+--------------+ GSV      Full                                                        +---------+---------------+---------+-----------+----------+--------------+     Summary: RIGHT: - There is no evidence of deep vein thrombosis in the lower extremity.  - A cystic structure is found in the popliteal fossa. - In addition to Bakers cyst, patient has a large hypoechoic fluid collection mid right thigh.  LEFT: - There is no evidence of deep vein thrombosis in the lower extremity.  - A cystic structure is found in the popliteal fossa.  *See table(s) above for measurements and observations. Electronically signed by Harold Barban MD on 04/08/2020 at 5:37:50 PM.    Final     Scheduled Meds: . aspirin EC  81 mg Oral Daily  . clopidogrel  75 mg Oral Daily  . enoxaparin (LOVENOX) injection  40 mg Subcutaneous Q24H  . omega-3 acid ethyl esters  1 g Oral Daily   Continuous Infusions: . sodium chloride 75 mL/hr at 04/09/20 1001  . sodium chloride Stopped (04/09/20 0521)  . cefTRIAXone (ROCEPHIN)  IV Stopped (04/09/20 0429)  . doxycycline (VIBRAMYCIN) IV 125 mL/hr at 04/09/20 0618     LOS: 1 day   Time spent: 35 minutes   Darliss Cheney, MD [facility]  04/09/2020, 4:31 PM   To contact the attending provider between 7A-7P or the covering provider during after hours 7P-7A, please log into the web site [email or web address].

## 2020-04-09 NOTE — Progress Notes (Signed)
Physical Therapy Treatment Patient Details Name: [name] MRN: [id number] DOB: Nov 30, 1928 Today's Date: 04/09/2020    History of Present Illness Patient is a 86 y/o female with PMH of HTN, OA, hearing loss, cataract, CAD,  presenting with slurred speech. 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     PT Comments    Pt was reluctant to do any activity, but was agreeable with encouragement to do a little mobility.  Emphasis on warm up of her knees, transition to EOB, sitting balance and scooting.  Worked on transfer safety with sit to stand and pivot transfer to the [facility].  Pt deferred ambulating in the RW due to knee too painful.    Follow Up Recommendations  CIR     Equipment Recommendations  Other (comment) (TBD)    Recommendations for Other Services Rehab consult     Precautions / Restrictions Precautions Precautions: Fall    Mobility  Bed Mobility Overal bed mobility: Needs Assistance Bed Mobility: Supine to Sit     Supine to sit: Mod assist     General bed mobility comments: cues for direction, assist to bring trunk forward and assist to scoot.  Transfers Overall transfer level: Needs assistance   Transfers: Sit to/from Stand;Stand Pivot Transfers Sit to Stand: Mod assist;Max assist (dependent of surface height) Stand pivot transfers: Mod assist       General transfer comment: used face to face assist to help pt forward and boost until she could get her knees extended and more stable.  Ambulation/Gait             General Gait Details: NT   Stairs             Wheelchair Mobility    Modified Rankin (Stroke Patients Only)       Balance Overall balance assessment: Needs assistance   Sitting balance-Leahy Scale: Fair       Standing balance-Leahy Scale: Poor Standing balance comment: Reliant on external support. Posterior lean noted.                             Cognition  Arousal/Alertness: Awake/alert Behavior During Therapy: WFL for tasks assessed/performed Overall Cognitive Status: Impaired/Different from baseline                         Following Commands: Follows one step commands inconsistently;Follows one step commands with increased time              Exercises Other Exercises Other Exercises: knee ROM warm up bilaterally prior to mobility    General Comments        Pertinent Vitals/Pain Pain Assessment: Faces Faces Pain Scale: Hurts even more Pain Location: bil knees Pain Descriptors / Indicators: Discomfort;Guarding;Grimacing;Sore Pain Intervention(s): Monitored during session;Limited activity within patient's tolerance    Home Living     Available Help at Discharge: Available PRN/intermittently;Family Type of Home: Independent living facility              Prior Function            PT Goals (current goals can now be found in the care plan section) Acute Rehab PT Goals PT Goal Formulation: With patient Time For Goal Achievement: 04/22/20 Potential to Achieve Goals: Good Progress towards PT goals: Progressing toward goals    Frequency    Min 4X/week      PT Plan  Current plan remains appropriate    Co-evaluation              AM-PAC PT "6 Clicks" Mobility   Outcome Measure  Help needed turning from your back to your side while in a flat bed without using bedrails?: A Lot Help needed moving from lying on your back to sitting on the side of a flat bed without using bedrails?: A Lot Help needed moving to and from a bed to a chair (including a wheelchair)?: A Lot Help needed standing up from a chair using your arms (e.g., wheelchair or bedside chair)?: A Lot Help needed to walk in hospital room?: A Lot Help needed climbing 3-5 steps with a railing? : Total 6 Click Score: 11    End of Session   Activity Tolerance: Patient tolerated treatment well;Patient limited by pain Patient left: in  bed;with call bell/phone within reach Nurse Communication: Mobility status PT Visit Diagnosis: Unsteadiness on feet (R26.81);Muscle weakness (generalized) (M62.81);Difficulty in walking, not elsewhere classified (R26.2)     Time: 5320-2334 PT Time Calculation (min) (ACUTE ONLY): 25 min  Charges:  $Therapeutic Activity: 23-37 mins                     04/09/2020  Ginger Carne., PT Acute Rehabilitation Services [phone]  (pager) [phone]  (office)   Tessie Fass Hawa Henly 04/09/2020, 5:18 PM

## 2020-04-09 NOTE — Consult Note (Signed)
Physical Medicine and Rehabilitation Consult Reason for Consult: Decreased functional mobility with slurred speech Referring Physician: [facility]   HPI: [name] is a 86 y.o. right-handed female with history of CAD, hyperlipidemia, TIA 2020 with no residual deficits.  History taken from chart review and nursing due to mentation.  Patient lives alone at [facility].  Independent with assistive device.  She presented on 04/08/2020 with aphasia.  Blood pressure was noted to be elevated at 165-250/78-210.  MRI personally reviewed.  Per read, 6 mm acute ischemic nonhemorrhagic left inferior frontal lobe infarct.  CT angiogram of head and neck acute proximal left M3 occlusion, inferior division.  Admission chemistries alcohol negative, potassium 7.5, SARS coronavirus negative, hemoglobin 14.1, troponin high-sensitivity 99 felt to be related to demand ischemia with follow-up per cardiology services, who recommended continuation of current care. Urine drug screen negative.  EKG showed no acute ST changes.  Bilateral lower extremity Dopplers negative.  Chest x-ray cardiomegaly with mild vascular congestion left lung base atelectasis versus infiltrate.  Echocardiogram with ejection fraction of 60-5%.  No wall motion abnormalities.  Currently maintained on aspirin and Plavix for CVA prophylaxis.  Subcutaneous Lovenox for DVT prophylaxis x3 weeks then Plavix alone.  Plan for 30-day cardiac event monitor.  Hospital course further complicated by CAP and started on Rocephin/doxycycline.  Therapy evaluations completed with recommendations of physical medicine rehab consult.   Review of Systems  Unable to perform ROS: Mental acuity   Past Medical History:  Diagnosis Date  . Arthritis   . CAD (coronary artery disease)    a. Mod prox LAD & first diagonal stenosis by cath 2002 - managed medically since that time with normal LV function.  . Cataract   . Hyperlipidemia   . Hypertension     Past Surgical History:  Procedure Laterality Date  . ABDOMINAL HYSTERECTOMY    . APPENDECTOMY    . BACK SURGERY    . CARPAL TUNNEL RELEASE    . CHOLECYSTECTOMY N/A 06/19/2013   Procedure: LAPAROSCOPIC CHOLECYSTECTOMY;  Surgeon: Harl Bowie, MD;  Location: [facility];  Service: General;  Laterality: N/A;  . ESOPHAGOGASTRODUODENOSCOPY N/A 04/26/2017   Procedure: ESOPHAGOGASTRODUODENOSCOPY (EGD) W/ DILATION;  Surgeon: Ronnette Juniper, MD;  Location: [facility];  Service: Gastroenterology;  Laterality: N/A;  . HERNIA REPAIR    . INSERTION OF MESH  05/19/2018   Procedure: INSERTION OF MESH;  Surgeon: Excell Seltzer, MD;  Location: [facility];  Service: General;;  . IR GENERIC HISTORICAL  03/18/2016   IR RADIOLOGIST EVAL & MGMT 03/18/2016 Corrie Mckusick, DO [facility] INTERV RAD  . KNEE SURGERY    . SHOULDER SURGERY    . SPLENECTOMY, TOTAL    . VENTRAL HERNIA REPAIR N/A 05/19/2018   Procedure: HERNIA REPAIR VENTRAL ADULT;  Surgeon: Excell Seltzer, MD;  Location: [facility];  Service: General;  Laterality: N/A;   Family History  Problem Relation Age of Onset  . Heart disease Mother        "enlarged valve"  . Cancer Mother        Ovarian  . Stroke Father   . Cancer Sister        Colon   Social History:  reports that she has never smoked. She has never used smokeless tobacco. She reports that she does not drink alcohol and does not use drugs. Allergies:  Allergies  Allergen Reactions  . Felodipine Anaphylaxis and Other (See Comments)    Reaction: unknown possible nausea or  rash  . Prednisone Other (See Comments)    Unknown reaction  . Shrimp [Shellfish Allergy] Nausea And Vomiting  . Codeine   . Crestor [Rosuvastatin]   . Feldene [Piroxicam]   . Lescol [Fluvastatin]   . Lipitor [Atorvastatin]   . Nexium [Esomeprazole]   . Pravachol [Pravastatin]   . Prilosec Otc [Omeprazole Magnesium]   . Statins Other (See Comments)    Muscle/leg pain.  Earnestine Mealing [Colesevelam]   . Zetia [Ezetimibe]    . Zithromax [Azithromycin]   . Zocor [Simvastatin]   . Omeprazole Other (See Comments)    Reaction: unknown possible nausea or rash   Medications Prior to Admission  Medication Sig Dispense Refill  . acetaminophen (TYLENOL 8 HOUR ARTHRITIS PAIN) 650 MG CR tablet Take 650 mg by mouth every 8 (eight) hours as needed for pain.     Marland Kitchen aspirin EC 81 MG tablet Take 81 mg by mouth daily.     Marland Kitchen lisinopril (ZESTRIL) 5 MG tablet Take 5 mg by mouth daily.    . Multiple Vitamins-Minerals (PRESERVISION AREDS PO) Take 1 capsule by mouth daily.     . Omega-3 Fatty Acids (FISH OIL PO) Take 1 capsule by mouth daily.     . traZODone (DESYREL) 50 MG tablet Take 25 mg by mouth at bedtime as needed for sleep.       Home: Home Living Family/patient expects to be discharged to:: Private residence Living Arrangements: Alone Available Help at Discharge: Family, Available PRN/intermittently Type of Home: Independent living facility Home Access: Level entry [place]: One level Bathroom Shower/Tub: Multimedia programmer: [place]: Environmental consultant - 4 wheels, Environmental consultant - 2 wheels, Bedside commode Additional Comments: Lives at [facility]   Lives With: Alone  Functional History: Prior Function Level of Independence: Independent with assistive device(s) Comments: Pt's son reports pt was very independent and cooked 1 meal/day and performed ADLs. Used rollator or RW for mobility. 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  Functional Status:  Mobility: Bed Mobility Overal bed mobility: Needs Assistance Bed Mobility: Supine to Sit Supine to sit: Mod assist General bed mobility comments: Mod A for trunk elevation to come to sitting. Also required assist to scoot hips to EOB.  Transfers Overall transfer level: Needs assistance Equipment used: None Transfers: Sit to/from Stand, Stand Pivot Transfers Sit to Stand: Mod assist Stand pivot transfers: Mod assist General transfer comment: Mod  A for lift assist and steadying to stand and transfer to chair. PT stood in front of pt and had pt hold to PT arms. Required assist for weightshifting in order to take steps. Posterior lean noted.       ADL: ADL Overall ADL's : Needs assistance/impaired Grooming: Minimal assistance, Sitting, Wash/dry face, Brushing hair Grooming Details (indicate cue type and reason): min assist to comb hair, increased time to follow simple commands; anticipate increased assist with oral care Upper Body Bathing: Minimal assistance, Sitting Lower Body Bathing: Maximal assistance, Sit to/from stand Upper Body Dressing : Minimal assistance, Sitting Lower Body Dressing: Total assistance, Sit to/from stand Toilet Transfer: Moderate assistance, Stand-pivot, BSC Toilet Transfer Details (indicate cue type and reason): on BSC upon entry, mod assist to pivot to recliner  Toileting- Clothing Manipulation and Hygiene: Total assistance, +2 for physical assistance, +2 for safety/equipment, Sit to/from stand Toileting - Clothing Manipulation Details (indicate cue type and reason): RN completing hygiene with therapist support to stand  Functional mobility during ADLs: Moderate assistance, Cueing for safety, Cueing for  sequencing General ADL Comments: pt limited by weakness, activity tolerance, commuincation, and cognition   Cognition: Cognition Overall Cognitive Status: Impaired/Different from baseline Arousal/Alertness: Awake/alert Orientation Level: Oriented to person Cognition Arousal/Alertness: Awake/alert (but fatigued ) Behavior During Therapy: WFL for tasks assessed/performed Overall Cognitive Status: Impaired/Different from baseline Area of Impairment: Following commands Following Commands: Follows one step commands inconsistently General Comments: Limited by aphasia. Was able to follow some simple commands.  Difficult to assess due to: Impaired communication  Blood pressure (!) 171/63, pulse 60,  temperature 98.7 F (37.1 C), temperature source Oral, resp. rate 16, height 5\' 3"  (1.6 m), weight 64.1 kg, SpO2 96 %. Physical Exam Vitals reviewed.  Constitutional:      General: She is not in acute distress.    Appearance: Normal appearance. She is not ill-appearing.  HENT:     Head: Normocephalic and atraumatic.     Right Ear: External ear normal.     Left Ear: External ear normal.     Nose: Nose normal.  Eyes:     General:        Right eye: No discharge.        Left eye: No discharge.     Comments: Limited ability to follow commands, but EOM appear intact  Cardiovascular:     Rate and Rhythm: Normal rate and regular rhythm.  Pulmonary:     Effort: Pulmonary effort is normal.     Breath sounds: Normal breath sounds.  Abdominal:     General: Abdomen is flat. Bowel sounds are normal. There is no distension.  Musculoskeletal:     Cervical back: Normal range of motion and neck supple.     Comments: No edema or tenderness in extremities  Skin:    General: Skin is warm and dry.  Neurological:     Mental Status: She is alert.     Comments: Alert Extremely HOH versus aphasia  Motor exam limited due to dissipation, moving bilateral upper extremities.   Psychiatric:     Comments: Unable to assess due to mentation.     Results for orders placed or performed during the hospital encounter of 04/07/20 (from the past 24 hour(s))  Glucose, capillary     Status: Abnormal   Collection Time: 04/08/20  6:19 AM  Result Value Ref Range   Glucose-Capillary 136 (H) 70 - 99 mg/dL  Troponin I (High Sensitivity)     Status: Abnormal   Collection Time: 04/08/20  7:41 AM  Result Value Ref Range   Troponin I (High Sensitivity) 99 (H) <18 ng/L  Hemoglobin A1c     Status: Abnormal   Collection Time: 04/08/20  7:41 AM  Result Value Ref Range   Hgb A1c MFr Bld 6.2 (H) 4.8 - 5.6 %   Mean Plasma Glucose 131 mg/dL  Comprehensive metabolic panel     Status: Abnormal   Collection Time: 04/08/20   7:41 AM  Result Value Ref Range   Sodium 139 135 - 145 mmol/L   Potassium 3.9 3.5 - 5.1 mmol/L   Chloride 104 98 - 111 mmol/L   CO2 25 22 - 32 mmol/L   Glucose, Bld 136 (H) 70 - 99 mg/dL   BUN 22 8 - 23 mg/dL   Creatinine, Ser 0.83 0.44 - 1.00 mg/dL   Calcium 9.5 8.9 - 10.3 mg/dL   Total Protein 5.4 (L) 6.5 - 8.1 g/dL   Albumin 3.3 (L) 3.5 - 5.0 g/dL   AST 25 15 - 41 U/L   ALT  20 0 - 44 U/L   Alkaline Phosphatase 55 38 - 126 U/L   Total Bilirubin 0.8 0.3 - 1.2 mg/dL   GFR calc non Af Amer >60 >60 mL/min   GFR calc Af Amer >60 >60 mL/min   Anion gap 10 5 - 15  CBC     Status: None   Collection Time: 04/08/20  7:41 AM  Result Value Ref Range   WBC 8.9 4.0 - 10.5 K/uL   RBC 4.37 3.87 - 5.11 MIL/uL   Hemoglobin 13.9 12.0 - 15.0 g/dL   HCT 41.6 36 - 46 %   MCV 95.2 80.0 - 100.0 fL   MCH 31.8 26.0 - 34.0 pg   MCHC 33.4 30.0 - 36.0 g/dL   RDW 14.7 11.5 - 15.5 %   Platelets 194 150 - 400 K/uL   nRBC 0.0 0.0 - 0.2 %  Troponin I (High Sensitivity)     Status: Abnormal   Collection Time: 04/08/20 10:34 AM  Result Value Ref Range   Troponin I (High Sensitivity) 156 (HH) <18 ng/L  Troponin I (High Sensitivity)     Status: Abnormal   Collection Time: 04/08/20 12:19 PM  Result Value Ref Range   Troponin I (High Sensitivity) 219 (HH) <18 ng/L  Troponin I (High Sensitivity)     Status: Abnormal   Collection Time: 04/08/20  2:57 PM  Result Value Ref Range   Troponin I (High Sensitivity) 234 (HH) <18 ng/L  Troponin I (High Sensitivity)     Status: Abnormal   Collection Time: 04/08/20  4:43 PM  Result Value Ref Range   Troponin I (High Sensitivity) 247 (HH) <18 ng/L  CBC with Differential/Platelet     Status: None   Collection Time: 04/09/20  1:49 AM  Result Value Ref Range   WBC 7.3 4.0 - 10.5 K/uL   RBC 4.24 3.87 - 5.11 MIL/uL   Hemoglobin 13.5 12.0 - 15.0 g/dL   HCT 40.3 36 - 46 %   MCV 95.0 80.0 - 100.0 fL   MCH 31.8 26.0 - 34.0 pg   MCHC 33.5 30.0 - 36.0 g/dL   RDW 14.9  11.5 - 15.5 %   Platelets 162 150 - 400 K/uL   nRBC 0.0 0.0 - 0.2 %   Neutrophils Relative % 64 %   Neutro Abs 4.6 1.7 - 7.7 K/uL   Lymphocytes Relative 21 %   Lymphs Abs 1.6 0.7 - 4.0 K/uL   Monocytes Relative 14 %   Monocytes Absolute 1.0 0 - 1 K/uL   Eosinophils Relative 1 %   Eosinophils Absolute 0.1 0 - 0 K/uL   Basophils Relative 0 %   Basophils Absolute 0.0 0 - 0 K/uL   Immature Granulocytes 0 %   Abs Immature Granulocytes 0.02 0.00 - 0.07 K/uL  Basic metabolic panel     Status: Abnormal   Collection Time: 04/09/20  1:49 AM  Result Value Ref Range   Sodium 136 135 - 145 mmol/L   Potassium 4.7 3.5 - 5.1 mmol/L   Chloride 102 98 - 111 mmol/L   CO2 23 22 - 32 mmol/L   Glucose, Bld 106 (H) 70 - 99 mg/dL   BUN 23 8 - 23 mg/dL   Creatinine, Ser 0.89 0.44 - 1.00 mg/dL   Calcium 9.1 8.9 - 10.3 mg/dL   GFR calc non Af Amer 57 (L) >60 mL/min   GFR calc Af Amer >60 >60 mL/min   Anion gap 11 5 - 15  CT ANGIO HEAD W OR WO CONTRAST  Result Date: 04/08/2020 CLINICAL DATA:  Follow-up examination for acute stroke. EXAM: CT ANGIOGRAPHY HEAD AND NECK TECHNIQUE: Multidetector CT imaging of the head and neck was performed using the standard protocol during bolus administration of intravenous contrast. Multiplanar CT image reconstructions and MIPs were obtained to evaluate the vascular anatomy. Carotid stenosis measurements (when applicable) are obtained utilizing NASCET criteria, using the distal internal carotid diameter as the denominator. CONTRAST:  53mL OMNIPAQUE IOHEXOL 350 MG/ML SOLN COMPARISON:  Prior studies from 04/07/2020 FINDINGS: CTA NECK FINDINGS Aortic arch: Visualized aortic arch of normal caliber with normal 3 vessel morphology. Mild atheromatous change about the arch and origin of the great vessels without hemodynamically significant stenosis. Right carotid system: Right CCA widely patent from its origin to the bifurcation without stenosis. Mild atheromatous change about the right  bifurcation without significant stenosis. Right ICA mildly tortuous and medialized into the retropharyngeal space but is widely patent to the skull base without stenosis, dissection or occlusion. Left carotid system: Left CCA patent from its origin to the bifurcation without flow-limiting stenosis. Mild atheromatous change about the origin of the left ICA without significant stenosis. Left ICA mildly tortuous and medialized into the retropharyngeal space but is widely patent to the skull base without stenosis, dissection or occlusion. Vertebral arteries: Both vertebral arteries arise from the subclavian arteries. Right vertebral artery dominant. Vertebral arteries widely patent without stenosis, dissection or occlusion. Skeleton: No definite acute osseous abnormality. Bones are somewhat diffusely sclerotic in appearance without discrete lytic or blastic osseous lesion Other neck: No other acute soft tissue abnormality within the neck. Few scattered thyroid nodules noted, largest of which measures 7 mm on the right. Findings felt to be of doubtful significance given size and patient age. No follow-up imaging recommended regarding these lesions. No other mass lesion or adenopathy. Upper chest: Mild scattered interlobular septal thickening noted within the visualized lungs, suggesting underlying mild pulmonary interstitial congestion. Mild irregular biapical pleuroparenchymal thickening/scarring noted. Visualized upper chest demonstrates no other acute finding. Review of the MIP images confirms the above findings CTA HEAD FINDINGS Anterior circulation: Petrous segments patent bilaterally. Mild scattered atheromatous change within the cavernous/supraclinoid ICAs without hemodynamically significant stenosis. ICA termini well perfused. A1 segments patent bilaterally. Normal anterior communicating artery complex. Anterior cerebral arteries patent to their distal aspects without stenosis. No M1 stenosis or occlusion. Normal  MCA bifurcations. On the left, there is a proximal left M3 occlusion, inferior division, in keeping with the previously identified small left MCA territory infarct (series 7, image 95). Otherwise, MCA branches well perfused and symmetric. Posterior circulation: Mild focal non stenotic plaque noted within the proximal right V4 segment. Vertebral arteries otherwise widely patent without stenosis. Both picas patent. Basilar patent to its distal aspect without stenosis. Superior cerebral arteries patent bilaterally. Left PCA supplied via the basilar. Fetal type origin of the right PCA. Both PCAs well perfused to their distal aspects without stenosis. Venous sinuses: Grossly patent allowing for timing the contrast bolus. Anatomic variants: Fetal type origin of the right PCA. No intracranial aneurysm. Review of the MIP images confirms the above findings IMPRESSION: 1. Acute proximal left M3 occlusion, inferior division, in keeping with the previously identified small left MCA territory infarct. 2. Mild atheromatous change about the carotid bifurcations and carotid siphons without hemodynamically significant or correctable stenosis. 3. Mild diffuse interlobular septal thickening within the visualized lungs, suggesting mild pulmonary interstitial congestion/edema. Electronically Signed   By: Pincus Badder.D.  On: 04/08/2020 01:57   DG Chest 2 View  Result Date: 04/08/2020 CLINICAL DATA:  86 year old female with slurred speech. EXAM: CHEST - 2 VIEW COMPARISON:  Chest radiograph dated 03/09/2019. FINDINGS: There is cardiomegaly with mild vascular congestion. Left lung base density may represent atelectasis or infiltrate. The right lung is clear. No pleural effusion or pneumothorax. There is a moderate size hiatal hernia. No acute osseous pathology. IMPRESSION: 1. Cardiomegaly with mild vascular congestion. 2. Left lung base atelectasis versus infiltrate. 3. Moderate size hiatal hernia. Electronically Signed    By: Anner Crete M.D.   On: 04/08/2020 01:10   CT HEAD WO CONTRAST  Result Date: 04/07/2020 CLINICAL DATA:  Acute neuro deficit.  Slurred speech EXAM: CT HEAD WITHOUT CONTRAST TECHNIQUE: Contiguous axial images were obtained from the base of the skull through the vertex without intravenous contrast. COMPARISON:  CT head 01/02/2020 FINDINGS: Brain: Mild cortical atrophy. Negative for hydrocephalus. Chronic infarct left thalamus unchanged. Mild chronic ischemic changes in the white matter. Negative for acute infarct, hemorrhage, mass Vascular: Negative for hyperdense vessel Skull: Negative Sinuses/Orbits: Paranasal sinuses clear. Bilateral cataract extraction. Other: None IMPRESSION: No acute abnormality no change from the prior study. Electronically Signed   By: Franchot Gallo M.D.   On: 04/07/2020 20:30   CT ANGIO NECK W OR WO CONTRAST  Result Date: 04/08/2020 CLINICAL DATA:  Follow-up examination for acute stroke. EXAM: CT ANGIOGRAPHY HEAD AND NECK TECHNIQUE: Multidetector CT imaging of the head and neck was performed using the standard protocol during bolus administration of intravenous contrast. Multiplanar CT image reconstructions and MIPs were obtained to evaluate the vascular anatomy. Carotid stenosis measurements (when applicable) are obtained utilizing NASCET criteria, using the distal internal carotid diameter as the denominator. CONTRAST:  41mL OMNIPAQUE IOHEXOL 350 MG/ML SOLN COMPARISON:  Prior studies from 04/07/2020 FINDINGS: CTA NECK FINDINGS Aortic arch: Visualized aortic arch of normal caliber with normal 3 vessel morphology. Mild atheromatous change about the arch and origin of the great vessels without hemodynamically significant stenosis. Right carotid system: Right CCA widely patent from its origin to the bifurcation without stenosis. Mild atheromatous change about the right bifurcation without significant stenosis. Right ICA mildly tortuous and medialized into the retropharyngeal  space but is widely patent to the skull base without stenosis, dissection or occlusion. Left carotid system: Left CCA patent from its origin to the bifurcation without flow-limiting stenosis. Mild atheromatous change about the origin of the left ICA without significant stenosis. Left ICA mildly tortuous and medialized into the retropharyngeal space but is widely patent to the skull base without stenosis, dissection or occlusion. Vertebral arteries: Both vertebral arteries arise from the subclavian arteries. Right vertebral artery dominant. Vertebral arteries widely patent without stenosis, dissection or occlusion. Skeleton: No definite acute osseous abnormality. Bones are somewhat diffusely sclerotic in appearance without discrete lytic or blastic osseous lesion Other neck: No other acute soft tissue abnormality within the neck. Few scattered thyroid nodules noted, largest of which measures 7 mm on the right. Findings felt to be of doubtful significance given size and patient age. No follow-up imaging recommended regarding these lesions. No other mass lesion or adenopathy. Upper chest: Mild scattered interlobular septal thickening noted within the visualized lungs, suggesting underlying mild pulmonary interstitial congestion. Mild irregular biapical pleuroparenchymal thickening/scarring noted. Visualized upper chest demonstrates no other acute finding. Review of the MIP images confirms the above findings CTA HEAD FINDINGS Anterior circulation: Petrous segments patent bilaterally. Mild scattered atheromatous change within the cavernous/supraclinoid ICAs without hemodynamically significant stenosis.  ICA termini well perfused. A1 segments patent bilaterally. Normal anterior communicating artery complex. Anterior cerebral arteries patent to their distal aspects without stenosis. No M1 stenosis or occlusion. Normal MCA bifurcations. On the left, there is a proximal left M3 occlusion, inferior division, in keeping with  the previously identified small left MCA territory infarct (series 7, image 95). Otherwise, MCA branches well perfused and symmetric. Posterior circulation: Mild focal non stenotic plaque noted within the proximal right V4 segment. Vertebral arteries otherwise widely patent without stenosis. Both picas patent. Basilar patent to its distal aspect without stenosis. Superior cerebral arteries patent bilaterally. Left PCA supplied via the basilar. Fetal type origin of the right PCA. Both PCAs well perfused to their distal aspects without stenosis. Venous sinuses: Grossly patent allowing for timing the contrast bolus. Anatomic variants: Fetal type origin of the right PCA. No intracranial aneurysm. Review of the MIP images confirms the above findings IMPRESSION: 1. Acute proximal left M3 occlusion, inferior division, in keeping with the previously identified small left MCA territory infarct. 2. Mild atheromatous change about the carotid bifurcations and carotid siphons without hemodynamically significant or correctable stenosis. 3. Mild diffuse interlobular septal thickening within the visualized lungs, suggesting mild pulmonary interstitial congestion/edema. Electronically Signed   By: Jeannine Boga M.D.   On: 04/08/2020 01:57   MR BRAIN WO CONTRAST  Result Date: 04/07/2020 CLINICAL DATA:  Initial evaluation for neuro deficit, stroke suspected. EXAM: MRI HEAD WITHOUT CONTRAST TECHNIQUE: Multiplanar, multiecho pulse sequences of the brain and surrounding structures were obtained without intravenous contrast. COMPARISON:  Prior CT from earlier the same day. FINDINGS: Brain: Generalized age-related cerebral atrophy. Minimal T2/FLAIR hyperintensity within the periventricular deep white matter both cerebral hemispheres most consistent with chronic small vessel ischemic disease, minor in appearance, and felt to be within normal limits for age. Small remote lacunar infarct noted at the left thalamus. 6 mm focus of  diffusion abnormality involving the cortical gray matter of the inferior left frontal lobe suspicious for a small acute ischemic infarct (series 5, image 76). No associated hemorrhage or mass effect. No other diffusion abnormality to suggest acute or subacute ischemia. Gray-white matter differentiation otherwise maintained. No evidence for acute or chronic intracranial hemorrhage. No mass lesion, midline shift or mass effect. No hydrocephalus or extra-axial fluid collection. Pituitary gland suprasellar region normal. Midline structures intact. Vascular: Major intracranial vascular flow voids are maintained. Skull and upper cervical spine: Craniocervical junction within normal limits. Bone marrow signal intensity normal. No scalp soft tissue abnormality. Sinuses/Orbits: Patient status post bilateral ocular lens replacement. Paranasal sinuses are largely clear. No significant mastoid effusion. Inner ear structures grossly normal. Other: None. IMPRESSION: 1. 6 mm acute ischemic nonhemorrhagic cortical infarct involving the inferior left frontal lobe. 2. No other acute intracranial abnormality. 3. Underlying age-related cerebral atrophy, with remote left thalamic lacunar infarct. Electronically Signed   By: Jeannine Boga M.D.   On: 04/07/2020 23:30   ECHOCARDIOGRAM COMPLETE  Result Date: 04/08/2020    ECHOCARDIOGRAM REPORT   Patient Name:   [name] Date of Exam: 04/08/2020 Medical Rec #:  [id number]      Height:       63.0 in Accession #:    [id number]     Weight:       141.3 lb Date of Birth:  1929/05/13      BSA:          1.668 m Patient Age:    89 years       BP:  163/65 mmHg Patient Gender: F              HR:           56 bpm. Exam Location:  Inpatient Procedure: 2D Echo, 3D Echo and Strain Analysis Indications:    Stroke 434.91 / I163.9  History:        Patient has prior history of Echocardiogram examinations, most                 recent 03/10/2019. CAD; Risk Factors:Hypertension and                  Dyslipidemia.  Sonographer:    Darlina Sicilian RDCS Referring Phys: [id number] [place]  1. Left ventricular ejection fraction, by estimation, is 60 to 65%. The left ventricle has normal function. The left ventricle has no regional wall motion abnormalities. There is mild concentric left ventricular hypertrophy. Left ventricular diastolic parameters are consistent with Grade I diastolic dysfunction (impaired relaxation). Elevated left ventricular end-diastolic pressure. The average left ventricular global longitudinal strain is -18.7 %. The global longitudinal strain is normal.  2. Right ventricular systolic function is normal. The right ventricular size is normal. There is normal pulmonary artery systolic pressure.  3. Left atrial size was severely dilated.  4. The mitral valve is normal in structure. Trivial mitral valve regurgitation. No evidence of mitral stenosis.  5. Tricuspid valve regurgitation is mild to moderate.  6. The aortic valve is tricuspid. Aortic valve regurgitation is not visualized. No aortic stenosis is present.  7. The inferior vena cava is normal in size with <50% respiratory variability, suggesting right atrial pressure of 8 mmHg. FINDINGS  Left Ventricle: Left ventricular ejection fraction, by estimation, is 60 to 65%. The left ventricle has normal function. The left ventricle has no regional wall motion abnormalities. The average left ventricular global longitudinal strain is -18.7 %. The global longitudinal strain is normal. The left ventricular internal cavity size was normal in size. There is mild concentric left ventricular hypertrophy. Left ventricular diastolic parameters are consistent with Grade I diastolic dysfunction (impaired relaxation). Elevated left ventricular end-diastolic pressure. Right Ventricle: The right ventricular size is normal. No increase in right ventricular wall thickness. Right ventricular systolic function is normal. There is normal  pulmonary artery systolic pressure. The tricuspid regurgitant velocity is 2.50 m/s, and  with an assumed right atrial pressure of 8 mmHg, the estimated right ventricular systolic pressure is 46.2 mmHg. Left Atrium: Left atrial size was severely dilated. Right Atrium: Right atrial size was normal in size. Pericardium: There is no evidence of pericardial effusion. Mitral Valve: The mitral valve is normal in structure. Normal mobility of the mitral valve leaflets. Moderate mitral annular calcification. Trivial mitral valve regurgitation. No evidence of mitral valve stenosis. Tricuspid Valve: The tricuspid valve is normal in structure. Tricuspid valve regurgitation is mild to moderate. No evidence of tricuspid stenosis. Aortic Valve: The aortic valve is tricuspid. . There is moderate thickening and moderate calcification of the aortic valve. Aortic valve regurgitation is not visualized. No aortic stenosis is present. There is moderate thickening of the aortic valve. There is moderate calcification of the aortic valve. Pulmonic Valve: The pulmonic valve was normal in structure. Pulmonic valve regurgitation is mild. No evidence of pulmonic stenosis. Aorta: The aortic root is normal in size and structure. Venous: The inferior vena cava is normal in size with less than 50% respiratory variability, suggesting right atrial pressure of 8 mmHg. IAS/Shunts: No atrial level shunt  detected by color flow Doppler.  LEFT VENTRICLE PLAX 2D LVIDd:         3.30 cm  Diastology LVIDs:         1.90 cm  LV e' lateral:   4.68 cm/s LV PW:         1.20 cm  LV E/e' lateral: 18.1 LV IVS:        1.10 cm  LV e' medial:    5.66 cm/s LVOT diam:     1.80 cm  LV E/e' medial:  14.9 LV SV:         54 LV SV Index:   32       2D Longitudinal Strain LVOT Area:     2.54 cm 2D Strain GLS (A2C):   -18.1 %                         2D Strain GLS (A3C):   -19.2 %                         2D Strain GLS (A4C):   -18.8 %                         2D Strain GLS Avg:      -18.7 %                          3D Volume EF:                         3D EF:        62 %                         LV EDV:       101 ml                         LV ESV:       38 ml                         LV SV:        63 ml RIGHT VENTRICLE TAPSE (M-mode): 2.8 cm LEFT ATRIUM             Index       RIGHT ATRIUM           Index LA diam:        3.60 cm 2.16 cm/m  RA Area:     18.50 cm LA Vol (A2C):   65.8 ml 39.44 ml/m RA Volume:   47.50 ml  28.47 ml/m LA Vol (A4C):   66.1 ml 39.62 ml/m LA Biplane Vol: 66.9 ml 40.10 ml/m  AORTIC VALVE LVOT Vmax:   117.00 cm/s LVOT Vmean:  78.300 cm/s LVOT VTI:    0.211 m  AORTA Ao Root diam: 2.80 cm Ao Asc diam:  3.20 cm MITRAL VALVE                TRICUSPID VALVE MV Area (PHT): 2.60 cm     TR Peak grad:   25.0 mmHg MV Decel Time: 292 msec     TR Vmax:        250.00 cm/s MV E velocity: 84.50 cm/s MV A velocity: 128.00 cm/s  SHUNTS MV E/A ratio:  0.66         Systemic VTI:  0.21 m                             Systemic Diam: 1.80 cm Skeet Latch MD Electronically signed by Skeet Latch MD Signature Date/Time: 04/08/2020/4:41:14 PM    Final    VAS [place] LOWER EXTREMITY VENOUS (DVT)  Result Date: 04/08/2020  Lower Venous DVTStudy Indications: Stroke.  Limitations: Pain. Performing Technologist: Antonieta Pert RDMS, RVT  Examination Guidelines: A complete evaluation includes B-mode imaging, spectral Doppler, color Doppler, and power Doppler as needed of all accessible portions of each vessel. Bilateral testing is considered an integral part of a complete examination. Limited examinations for reoccurring indications may be performed as noted. The reflux portion of the exam is performed with the patient in reverse Trendelenburg.  +---------+---------------+---------+-----------+----------+--------------+ RIGHT    CompressibilityPhasicitySpontaneityPropertiesThrombus Aging +---------+---------------+---------+-----------+----------+--------------+ CFV      Full            Yes      Yes                                 +---------+---------------+---------+-----------+----------+--------------+ SFJ      Full                                                        +---------+---------------+---------+-----------+----------+--------------+ FV Prox  Full                                                        +---------+---------------+---------+-----------+----------+--------------+ FV Mid   Full                                                        +---------+---------------+---------+-----------+----------+--------------+ FV DistalFull                                                        +---------+---------------+---------+-----------+----------+--------------+ PFV      Full                                                        +---------+---------------+---------+-----------+----------+--------------+ POP      Full           Yes      Yes                                 +---------+---------------+---------+-----------+----------+--------------+ PTV      Full                                                        +---------+---------------+---------+-----------+----------+--------------+  PERO     Full                                                        +---------+---------------+---------+-----------+----------+--------------+ GSV      Full                                                        +---------+---------------+---------+-----------+----------+--------------+ Hypoechoic fliud collection seen mid thigh aprox 6.4 x 3.1 x 1.7cm  +---------+---------------+---------+-----------+----------+--------------+ LEFT     CompressibilityPhasicitySpontaneityPropertiesThrombus Aging +---------+---------------+---------+-----------+----------+--------------+ CFV      Full           Yes      Yes                                 +---------+---------------+---------+-----------+----------+--------------+ SFJ       Full                                                        +---------+---------------+---------+-----------+----------+--------------+ FV Prox  Full                                                        +---------+---------------+---------+-----------+----------+--------------+ FV Mid   Full                                                        +---------+---------------+---------+-----------+----------+--------------+ FV DistalFull                                                        +---------+---------------+---------+-----------+----------+--------------+ PFV      Full                                                        +---------+---------------+---------+-----------+----------+--------------+ POP      Full           Yes      Yes                                 +---------+---------------+---------+-----------+----------+--------------+ PTV      Full                                                        +---------+---------------+---------+-----------+----------+--------------+  PERO     Full                                                        +---------+---------------+---------+-----------+----------+--------------+ GSV      Full                                                        +---------+---------------+---------+-----------+----------+--------------+     Summary: RIGHT: - There is no evidence of deep vein thrombosis in the lower extremity.  - A cystic structure is found in the popliteal fossa. - In addition to Bakers cyst, patient has a large hypoechoic fluid collection mid right thigh.  LEFT: - There is no evidence of deep vein thrombosis in the lower extremity.  - A cystic structure is found in the popliteal fossa.  *See table(s) above for measurements and observations. Electronically signed by Harold Barban MD on 04/08/2020 at 5:37:50 PM.    Final     Assessment/Plan: Diagnosis: Acute ischemic nonhemorrhagic left inferior  frontal lobe infarct Stroke: Continue secondary stroke prophylaxis and Risk Factor Modification listed below:   Antiplatelet therapy:   Blood Pressure Management:  Continue current medication with prn's with permisive HTN per primary team Statin Agent:   ?hemiparesis: fit for orthosis to prevent contractures (resting hand splint for day, wrist cock up splint at night, PRAFO, etc) PT/OT for mobility, ADL training  Motor recovery: Zoloft Labs and images (see above) independently reviewed.  Records reviewed and summated above.  1. Does the need for close, 24 hr/day medical supervision in concert with the patient's rehab needs make it unreasonable for this patient to be served in a less intensive setting? Yes  2. Co-Morbidities requiring supervision/potential complications: AD, hyperlipidemia, TIA 2020 with no residual deficits, CKD stage III (avoid nephrotoxic meds, repeat labs, encourage fluids) , elevated troponins (demand ischemia per cardiology), HTN (monitor and provide prns in accordance with increased physical exertion and pain), 3. Due to bladder management, bowel management, safety, skin/wound care, disease management, medication administration and patient education, does the patient require 24 hr/day rehab nursing? Yes 4. Does the patient require coordinated care of a physician, rehab nurse, therapy disciplines of PT/OT/SLP to address physical and functional deficits in the context of the above medical diagnosis(es)? Yes Addressing deficits in the following areas: balance, endurance, locomotion, strength, transferring, bathing, toileting, cognition, speech, language and psychosocial support 5. Can the patient actively participate in an intensive therapy program of at least 3 hrs of therapy per day at least 5 days per week? Yes 6. The potential for patient to make measurable gains while on inpatient rehab is excellent 7. Anticipated functional outcomes upon discharge from inpatient rehab are  supervision and min assist  with PT, supervision and min assist with OT, supervision and min assist with SLP. 8. Estimated rehab length of stay to reach the above functional goals is: 14-17 days. 9. Anticipated discharge destination: Other 10. Overall Rehab/Functional Prognosis: good  RECOMMENDATIONS: This patient's condition is appropriate for continued rehabilitative care in the following setting: CIR caregiver support available upon discharge, otherwise recommend SNF at [facility]. Patient has agreed to participate in recommended program. Potentially Note that  insurance prior authorization may be required for reimbursement for recommended care.  Comment: Rehab Admissions Coordinator to follow up.  I have personally performed a face to face diagnostic evaluation, including, but not limited to relevant history and physical exam findings, of this patient and developed relevant assessment and plan.  Additionally, I have reviewed and concur with the physician assistant's documentation above.   Delice Lesch, MD, ABPMR Lavon Paganini Angiulli, PA-C 04/09/2020

## 2020-04-09 NOTE — Progress Notes (Signed)
Pt's BP was 205/99 after being checked twice. Notified MD and received ok to administer PRN Labetalol. BP rechecked 30 mins later and is 188/67. Will continue to monitor.

## 2020-04-09 NOTE — Progress Notes (Signed)
STROKE TEAM PROGRESS NOTE   INTERVAL HISTORY No family at the bedside. Pt lying in bed, pleasant but severe hard of hearing. Still has mild expressive aphasia, with word salad in longer sentences. But for short 3-5 words sentence, she did well comparing with yesterday.   Vitals:   04/09/20 0757 04/09/20 1116 04/09/20 1551 04/09/20 1633  BP: (!) 193/75 (!) 182/66 (!) 205/99 (!) 188/67  Pulse: 66 (!) 59 66 (!) 59  Resp: 16 18 18 18   Temp: 98 F (36.7 C) 98.7 F (37.1 C) 98.3 F (36.8 C) 97.9 F (36.6 C)  TempSrc: Oral Oral Oral   SpO2: 93% 95% 97% 98%  Weight:      Height:       CBC:  Recent Labs  Lab 04/07/20 2009 04/07/20 2026 04/08/20 0741 04/09/20 0149  WBC 6.7   < > 8.9 7.3  NEUTROABS 4.1  --   --  4.6  HGB 14.1   < > 13.9 13.5  HCT 43.4   < > 41.6 40.3  MCV 96.4   < > 95.2 95.0  PLT 126*   < > 194 162   < > = values in this interval not displayed.   Basic Metabolic Panel:  Recent Labs  Lab 04/08/20 0741 04/09/20 0149  NA 139 136  K 3.9 4.7  CL 104 102  CO2 25 23  GLUCOSE 136* 106*  BUN 22 23  CREATININE 0.83 0.89  CALCIUM 9.5 9.1   Lipid Panel:  Recent Labs  Lab 04/08/20 0135  CHOL 226*  TRIG 83  HDL 65  CHOLHDL 3.5  VLDL 17  LDLCALC 144*   HgbA1c:  Recent Labs  Lab 04/08/20 0741  HGBA1C 6.2*     Urine Drug Screen:  Recent Labs  Lab 04/08/20 0420  LABOPIA NONE DETECTED  COCAINSCRNUR NONE DETECTED  LABBENZ NONE DETECTED  AMPHETMU NONE DETECTED  THCU NONE DETECTED  LABBARB NONE DETECTED    Alcohol Level  Recent Labs  Lab 04/07/20 2009  ETH <10    IMAGING past 24 hours No results found.  PHYSICAL EXAM  Temp:  [97.9 F (36.6 C)-98.7 F (37.1 C)] 97.9 F (36.6 C) (08/25 1633) Pulse Rate:  [59-72] 59 (08/25 1633) Resp:  [16-18] 18 (08/25 1633) BP: (134-205)/(63-99) 188/67 (08/25 1633) SpO2:  [93 %-98 %] 98 % (08/25 1633)  General - Well nourished, well developed, in no apparent distress.  Ophthalmologic - fundi not  visualized due to noncooperation.  Cardiovascular - Regular rhythm and rate.  Neuro - severe hard of hearing, awake, alert, eyes open, orientated to place, time and age. Able to follow simple commands and able to pantomime.  Tracking bilaterally, no gaze preference, blinking to visual threat bilaterally.  PERRL.  No facial droop, tongue protrusion midline.  Bilateral upper extremity at least 4/5, bilateral lower extremity at least 3/5, but difficulty with knee flexion bilaterally due to pain from arthritis. Sensation symmetrical, coordination not cooperative due to hard of hearing and gait not tested.   ASSESSMENT/PLAN [name] is a 86 y.o. female with history of coronary artery disease, hypertension, hyperlipidemia, TIA in 2020 with left-sided symptoms and no residual deficits, presenting with speech difficulties.   Stroke:   L frontal lobe cortical small infarct, embolic pattern, secondary to unknown source, suspicious for AF  CT head No acute abnormality.   MRI  Inferior L frontal lobe cortical infarct. Atrophy. Old L thalamic lacune   CTA head & neck acute proximal L  M3 occlusion. Mild B ICA bifurcation atherosclerosis. Possible mild pulmonary interstitial congestion.  LE Doppler  No DVT.   2D Echo EF 60-65%  30d monitor requested to look for AF as possible source of stroke - after discussion with son (pt severe hard of hearing not able to participate discussion) who feels pt old-fashioned, conservative, likely not able to accept implantable loop recorder especially with difficulty understanding the rationale with the skin cutting.  LDL 144  HgbA1c 6.2  VTE prophylaxis - Lovenox 40 mg sq daily   aspirin 81 mg daily prior to admission, now on aspirin 81 mg daily and clopidogrel 75 mg daily. Continue DAPT x 3 weeks then plavix alone  Therapy recommendations:  CIR  Disposition:  pending (independent w/ cleaning help PTA)  Hx of TIA   02/2019 -admitted for left arm/lip  numbness.  CT, MRI, MRA, carotid Doppler all negative.  EF 60 to 65%.  DVT negative.  LDL 138, A1c 6.2.  Etiology concerning for possible TIA vs migraine equivalent.  Discharged on DAPT and pravastatin 20.  Pravastatin 20 was not on medication list PTA, likely again intolerable and discontinued.  Followed by Dr. Leonie Man and Frann Rider as an OP  Hypertensive Urgency  BP as high as 249/110  Home meds:  zestril 5  BP on the high end  Gradually normalize BP in 2-3 days  Increase lisinopril from 5 to 10 . Long-term BP goal normotensive  Hyperlipidemia  Home meds:  No statin, on omega 3  Documented allergies to multiple statins  Fish oil resumed in hospital  LDL 144, goal < 70  PCP follows lipids. Recommend consideration of [facility]-9  UTI  UA WBC 11-20  On rocephin  Urine culture pending  Other Stroke Risk Factors  Advanced age  Family hx stroke (father)  Coronary artery disease  Other Active Problems  Thrombocytopenia, resolved - platelet 126->194->162  Upper back pain - EKG no ST-T change, CTA no aortic dissection - troponin flat - cardiology on board - no more evaluation needed.  Likely L wrist carpal tunnel  Severe hearing loss - no hearing aid yet  Hospital day # 1  Neurology will sign off. Please call with questions. Pt will follow up with stroke clinic NP at [facility] in about 4 weeks. Thanks for the consult.  Summer Hawking, MD PhD Stroke Neurology 04/09/2020 5:47 PM  To contact Stroke Continuity provider, please refer to [email or web address] After hours, contact General Neurology

## 2020-04-09 NOTE — Plan of Care (Signed)
  Problem: Pain Managment: Goal: General experience of comfort will improve Outcome: Progressing   Problem: Safety: Goal: Ability to remain free from injury will improve Outcome: Progressing   Problem: Skin Integrity: Goal: Risk for impaired skin integrity will decrease Outcome: Progressing   

## 2020-04-09 NOTE — Progress Notes (Signed)
  Speech Language Pathology Treatment: Cognitive-Linquistic  Patient Details Name: [name] MRN: [id number] DOB: 1929/06/15 Today's Date: 04/09/2020 Time: 1435-1500 SLP Time Calculation (min) (ACUTE ONLY): 25 min  Assessment / Plan / Recommendation Clinical Impression  [name] continues with expressive/receptive language impairment, most consistent with Wernicke's sub-type aphasia. She is noted with fluent aphasia with reduced insight. Although she is hard of hearing baseline, receptive language appears very impaired for auditory processing of verbal speech. She was able to read written speech aloud with excellent fluency and acc'y, but unfortunately did not appear to comprehend most of what she was reading. She answered yes/no verbally in 0/5 trials and yes/no written in 1/3 trials, so this may be a better form of communication for her. She named items with acc'y in 9/10 with error being semantic (called light a window). She required max cues for automatic speech tasks (counting, days of the week, etc.) She completed repetition with acc'y in 1/10 trials, consistent with Wernicke's, given difficulty processing auditory information. Object recognition was accurate in 4/10 trials. Pt was very pleasant, but appeared to have no insight into her fluent (but nonsensical) speech. Given her baseline of mostly independent, recommend intensive aphasia therapy at next level of care. Continue aphasia therapy on acute phase.  Pt politely declined any PO trials during this session. Will follow for diet tolerance x1 as allowed by pt.   HPI HPI: [name] is a 86 y.o. female with medical history significant for hypertension, CAD, hyperlipidemia, osteoarthritis presents to the hospital for evaluation of slurred speech. MR Brain revealed 6 mm acute ischemic nonhemorrhagic cortical infarct involving the inferior left frontal lobe.      SLP Plan  Continue with current plan of care             Oral  Care Recommendations: Oral care BID Follow up Recommendations: Skilled Nursing facility, Inpatient Rehabilitation facility SLP Visit Diagnosis: Aphasia (R47.01) Plan: Continue with current plan of care                     Rhett Najera P. Laurabelle Gorczyca, M.S., [place] Pathologist Acute Rehabilitation Services Pager: [place] 04/09/2020, 2:59 PM

## 2020-04-09 NOTE — Progress Notes (Signed)
Progress Note  Patient Name: [name] Date of Encounter: 04/09/2020  [facility] Cardiologist: Sinclair Grooms, MD   Subjective   No cardiac complaints.  Inpatient Medications    Scheduled Meds: . aspirin EC  81 mg Oral Daily  . clopidogrel  75 mg Oral Daily  . enoxaparin (LOVENOX) injection  40 mg Subcutaneous Q24H  . omega-3 acid ethyl esters  1 g Oral Daily   Continuous Infusions: . sodium chloride 75 mL/hr at 04/09/20 1001  . sodium chloride Stopped (04/09/20 0521)  . cefTRIAXone (ROCEPHIN)  IV Stopped (04/09/20 0429)  . doxycycline (VIBRAMYCIN) IV 125 mL/hr at 04/09/20 0618   PRN Meds: sodium chloride, acetaminophen, labetalol, ondansetron (ZOFRAN) IV, senna-docusate, traZODone   Vital Signs    Vitals:   04/08/20 2053 04/08/20 2301 04/09/20 0306 04/09/20 0757  BP: (!) 191/67 134/71 (!) 171/63 (!) 193/75  Pulse: (!) 59 60 60 66  Resp: 18 16 16 16   Temp: 98.2 F (36.8 C) 98.1 F (36.7 C) 98.7 F (37.1 C) 98 F (36.7 C)  TempSrc: Oral Oral Oral Oral  SpO2: 96% 95% 96% 93%  Weight:      Height:        Intake/Output Summary (Last 24 hours) at 04/09/2020 1028 Last data filed at 04/09/2020 7893 Gross per 24 hour  Intake 2561.08 ml  Output --  Net 2561.08 ml   Last 3 Weights 04/08/2020 04/07/2020 10/24/2019  Weight (lbs) 141 lb 5 oz 149 lb 11.1 oz 149 lb 9.6 oz  Weight (kg) 64.1 kg 67.9 kg 67.858 kg      Telemetry    Sinus rhythm- Personally Reviewed  ECG    Last tracing was 04/07/2020 with sinus bradycardia no acute change- Personally Reviewed  Physical Exam  Elderly and difficulty hearing. GEN: No acute distress.   Neck: No JVD Cardiac: RRR, no murmurs, rubs, or gallops.  Respiratory: Clear to auscultation bilaterally. GI: Soft, nontender, non-distended  MS: No edema; No deformity. Neuro:  Nonfocal  Psych: Difficulty hearing.  Aphasia with garbled speech  Labs    High Sensitivity Troponin:   Recent Labs  Lab 04/08/20 0741  04/08/20 1034 04/08/20 1219 04/08/20 1457 04/08/20 1643  TROPONINIHS 99* 156* 219* 234* 247*      Chemistry Recent Labs  Lab 04/07/20 2009 04/07/20 2009 04/07/20 2026 04/07/20 2026 04/07/20 2205 04/08/20 0741 04/09/20 0149  NA 137   < > 135  --   --  139 136  K >7.5*   < > 7.6*   < > 4.2 3.9 4.7  CL 103   < > 105  --   --  104 102  CO2 24  --   --   --   --  25 23  GLUCOSE 92   < > 90  --   --  136* 106*  BUN 30*   < > 51*  --   --  22 23  CREATININE 0.94   < > 0.90  --   --  0.83 0.89  CALCIUM 9.5  --   --   --   --  9.5 9.1  PROT 5.5*  --   --   --   --  5.4*  --   ALBUMIN 3.5  --   --   --   --  3.3*  --   AST 60*  --   --   --   --  25  --   ALT 13  --   --   --   --  20  --   ALKPHOS 59  --   --   --   --  55  --   BILITOT 2.2*  --   --   --   --  0.8  --   GFRNONAA 54*  --   --   --   --  >60 57*  GFRAA >60  --   --   --   --  >60 >60  ANIONGAP 10  --   --   --   --  10 11   < > = values in this interval not displayed.     Hematology Recent Labs  Lab 04/07/20 2009 04/07/20 2009 04/07/20 2026 04/08/20 0741 04/09/20 0149  WBC 6.7  --   --  8.9 7.3  RBC 4.50  --   --  4.37 4.24  HGB 14.1   < > 14.6 13.9 13.5  HCT 43.4   < > 43.0 41.6 40.3  MCV 96.4  --   --  95.2 95.0  MCH 31.3  --   --  31.8 31.8  MCHC 32.5  --   --  33.4 33.5  RDW 15.0  --   --  14.7 14.9  PLT 126*  --   --  194 162   < > = values in this interval not displayed.    BNPNo results for input(s): BNP, PROBNP in the last 168 hours.   DDimer No results for input(s): DDIMER in the last 168 hours.   Radiology    CT ANGIO HEAD W OR WO CONTRAST  Result Date: 04/08/2020 CLINICAL DATA:  Follow-up examination for acute stroke. EXAM: CT ANGIOGRAPHY HEAD AND NECK TECHNIQUE: Multidetector CT imaging of the head and neck was performed using the standard protocol during bolus administration of intravenous contrast. Multiplanar CT image reconstructions and MIPs were obtained to evaluate the  vascular anatomy. Carotid stenosis measurements (when applicable) are obtained utilizing NASCET criteria, using the distal internal carotid diameter as the denominator. CONTRAST:  48mL OMNIPAQUE IOHEXOL 350 MG/ML SOLN COMPARISON:  Prior studies from 04/07/2020 FINDINGS: CTA NECK FINDINGS Aortic arch: Visualized aortic arch of normal caliber with normal 3 vessel morphology. Mild atheromatous change about the arch and origin of the great vessels without hemodynamically significant stenosis. Right carotid system: Right CCA widely patent from its origin to the bifurcation without stenosis. Mild atheromatous change about the right bifurcation without significant stenosis. Right ICA mildly tortuous and medialized into the retropharyngeal space but is widely patent to the skull base without stenosis, dissection or occlusion. Left carotid system: Left CCA patent from its origin to the bifurcation without flow-limiting stenosis. Mild atheromatous change about the origin of the left ICA without significant stenosis. Left ICA mildly tortuous and medialized into the retropharyngeal space but is widely patent to the skull base without stenosis, dissection or occlusion. Vertebral arteries: Both vertebral arteries arise from the subclavian arteries. Right vertebral artery dominant. Vertebral arteries widely patent without stenosis, dissection or occlusion. Skeleton: No definite acute osseous abnormality. Bones are somewhat diffusely sclerotic in appearance without discrete lytic or blastic osseous lesion Other neck: No other acute soft tissue abnormality within the neck. Few scattered thyroid nodules noted, largest of which measures 7 mm on the right. Findings felt to be of doubtful significance given size and patient age. No follow-up imaging recommended regarding these lesions. No other mass lesion or adenopathy. Upper chest: Mild scattered interlobular septal thickening noted within the visualized lungs, suggesting underlying  mild pulmonary interstitial  congestion. Mild irregular biapical pleuroparenchymal thickening/scarring noted. Visualized upper chest demonstrates no other acute finding. Review of the MIP images confirms the above findings CTA HEAD FINDINGS Anterior circulation: Petrous segments patent bilaterally. Mild scattered atheromatous change within the cavernous/supraclinoid ICAs without hemodynamically significant stenosis. ICA termini well perfused. A1 segments patent bilaterally. Normal anterior communicating artery complex. Anterior cerebral arteries patent to their distal aspects without stenosis. No M1 stenosis or occlusion. Normal MCA bifurcations. On the left, there is a proximal left M3 occlusion, inferior division, in keeping with the previously identified small left MCA territory infarct (series 7, image 95). Otherwise, MCA branches well perfused and symmetric. Posterior circulation: Mild focal non stenotic plaque noted within the proximal right V4 segment. Vertebral arteries otherwise widely patent without stenosis. Both picas patent. Basilar patent to its distal aspect without stenosis. Superior cerebral arteries patent bilaterally. Left PCA supplied via the basilar. Fetal type origin of the right PCA. Both PCAs well perfused to their distal aspects without stenosis. Venous sinuses: Grossly patent allowing for timing the contrast bolus. Anatomic variants: Fetal type origin of the right PCA. No intracranial aneurysm. Review of the MIP images confirms the above findings IMPRESSION: 1. Acute proximal left M3 occlusion, inferior division, in keeping with the previously identified small left MCA territory infarct. 2. Mild atheromatous change about the carotid bifurcations and carotid siphons without hemodynamically significant or correctable stenosis. 3. Mild diffuse interlobular septal thickening within the visualized lungs, suggesting mild pulmonary interstitial congestion/edema. Electronically Signed   By: Jeannine Boga M.D.   On: 04/08/2020 01:57   DG Chest 2 View  Result Date: 04/08/2020 CLINICAL DATA:  86 year old female with slurred speech. EXAM: CHEST - 2 VIEW COMPARISON:  Chest radiograph dated 03/09/2019. FINDINGS: There is cardiomegaly with mild vascular congestion. Left lung base density may represent atelectasis or infiltrate. The right lung is clear. No pleural effusion or pneumothorax. There is a moderate size hiatal hernia. No acute osseous pathology. IMPRESSION: 1. Cardiomegaly with mild vascular congestion. 2. Left lung base atelectasis versus infiltrate. 3. Moderate size hiatal hernia. Electronically Signed   By: Anner Crete M.D.   On: 04/08/2020 01:10   CT HEAD WO CONTRAST  Result Date: 04/07/2020 CLINICAL DATA:  Acute neuro deficit.  Slurred speech EXAM: CT HEAD WITHOUT CONTRAST TECHNIQUE: Contiguous axial images were obtained from the base of the skull through the vertex without intravenous contrast. COMPARISON:  CT head 01/02/2020 FINDINGS: Brain: Mild cortical atrophy. Negative for hydrocephalus. Chronic infarct left thalamus unchanged. Mild chronic ischemic changes in the white matter. Negative for acute infarct, hemorrhage, mass Vascular: Negative for hyperdense vessel Skull: Negative Sinuses/Orbits: Paranasal sinuses clear. Bilateral cataract extraction. Other: None IMPRESSION: No acute abnormality no change from the prior study. Electronically Signed   By: Franchot Gallo M.D.   On: 04/07/2020 20:30   CT ANGIO NECK W OR WO CONTRAST  Result Date: 04/08/2020 CLINICAL DATA:  Follow-up examination for acute stroke. EXAM: CT ANGIOGRAPHY HEAD AND NECK TECHNIQUE: Multidetector CT imaging of the head and neck was performed using the standard protocol during bolus administration of intravenous contrast. Multiplanar CT image reconstructions and MIPs were obtained to evaluate the vascular anatomy. Carotid stenosis measurements (when applicable) are obtained utilizing NASCET criteria, using  the distal internal carotid diameter as the denominator. CONTRAST:  65mL OMNIPAQUE IOHEXOL 350 MG/ML SOLN COMPARISON:  Prior studies from 04/07/2020 FINDINGS: CTA NECK FINDINGS Aortic arch: Visualized aortic arch of normal caliber with normal 3 vessel morphology. Mild atheromatous change about the arch  and origin of the great vessels without hemodynamically significant stenosis. Right carotid system: Right CCA widely patent from its origin to the bifurcation without stenosis. Mild atheromatous change about the right bifurcation without significant stenosis. Right ICA mildly tortuous and medialized into the retropharyngeal space but is widely patent to the skull base without stenosis, dissection or occlusion. Left carotid system: Left CCA patent from its origin to the bifurcation without flow-limiting stenosis. Mild atheromatous change about the origin of the left ICA without significant stenosis. Left ICA mildly tortuous and medialized into the retropharyngeal space but is widely patent to the skull base without stenosis, dissection or occlusion. Vertebral arteries: Both vertebral arteries arise from the subclavian arteries. Right vertebral artery dominant. Vertebral arteries widely patent without stenosis, dissection or occlusion. Skeleton: No definite acute osseous abnormality. Bones are somewhat diffusely sclerotic in appearance without discrete lytic or blastic osseous lesion Other neck: No other acute soft tissue abnormality within the neck. Few scattered thyroid nodules noted, largest of which measures 7 mm on the right. Findings felt to be of doubtful significance given size and patient age. No follow-up imaging recommended regarding these lesions. No other mass lesion or adenopathy. Upper chest: Mild scattered interlobular septal thickening noted within the visualized lungs, suggesting underlying mild pulmonary interstitial congestion. Mild irregular biapical pleuroparenchymal thickening/scarring noted.  Visualized upper chest demonstrates no other acute finding. Review of the MIP images confirms the above findings CTA HEAD FINDINGS Anterior circulation: Petrous segments patent bilaterally. Mild scattered atheromatous change within the cavernous/supraclinoid ICAs without hemodynamically significant stenosis. ICA termini well perfused. A1 segments patent bilaterally. Normal anterior communicating artery complex. Anterior cerebral arteries patent to their distal aspects without stenosis. No M1 stenosis or occlusion. Normal MCA bifurcations. On the left, there is a proximal left M3 occlusion, inferior division, in keeping with the previously identified small left MCA territory infarct (series 7, image 95). Otherwise, MCA branches well perfused and symmetric. Posterior circulation: Mild focal non stenotic plaque noted within the proximal right V4 segment. Vertebral arteries otherwise widely patent without stenosis. Both picas patent. Basilar patent to its distal aspect without stenosis. Superior cerebral arteries patent bilaterally. Left PCA supplied via the basilar. Fetal type origin of the right PCA. Both PCAs well perfused to their distal aspects without stenosis. Venous sinuses: Grossly patent allowing for timing the contrast bolus. Anatomic variants: Fetal type origin of the right PCA. No intracranial aneurysm. Review of the MIP images confirms the above findings IMPRESSION: 1. Acute proximal left M3 occlusion, inferior division, in keeping with the previously identified small left MCA territory infarct. 2. Mild atheromatous change about the carotid bifurcations and carotid siphons without hemodynamically significant or correctable stenosis. 3. Mild diffuse interlobular septal thickening within the visualized lungs, suggesting mild pulmonary interstitial congestion/edema. Electronically Signed   By: Jeannine Boga M.D.   On: 04/08/2020 01:57   MR BRAIN WO CONTRAST  Result Date: 04/07/2020 CLINICAL DATA:   Initial evaluation for neuro deficit, stroke suspected. EXAM: MRI HEAD WITHOUT CONTRAST TECHNIQUE: Multiplanar, multiecho pulse sequences of the brain and surrounding structures were obtained without intravenous contrast. COMPARISON:  Prior CT from earlier the same day. FINDINGS: Brain: Generalized age-related cerebral atrophy. Minimal T2/FLAIR hyperintensity within the periventricular deep white matter both cerebral hemispheres most consistent with chronic small vessel ischemic disease, minor in appearance, and felt to be within normal limits for age. Small remote lacunar infarct noted at the left thalamus. 6 mm focus of diffusion abnormality involving the cortical gray matter of the inferior left  frontal lobe suspicious for a small acute ischemic infarct (series 5, image 76). No associated hemorrhage or mass effect. No other diffusion abnormality to suggest acute or subacute ischemia. Gray-white matter differentiation otherwise maintained. No evidence for acute or chronic intracranial hemorrhage. No mass lesion, midline shift or mass effect. No hydrocephalus or extra-axial fluid collection. Pituitary gland suprasellar region normal. Midline structures intact. Vascular: Major intracranial vascular flow voids are maintained. Skull and upper cervical spine: Craniocervical junction within normal limits. Bone marrow signal intensity normal. No scalp soft tissue abnormality. Sinuses/Orbits: Patient status post bilateral ocular lens replacement. Paranasal sinuses are largely clear. No significant mastoid effusion. Inner ear structures grossly normal. Other: None. IMPRESSION: 1. 6 mm acute ischemic nonhemorrhagic cortical infarct involving the inferior left frontal lobe. 2. No other acute intracranial abnormality. 3. Underlying age-related cerebral atrophy, with remote left thalamic lacunar infarct. Electronically Signed   By: Jeannine Boga M.D.   On: 04/07/2020 23:30   ECHOCARDIOGRAM COMPLETE  Result Date:  04/08/2020    ECHOCARDIOGRAM REPORT   Patient Name:   [name] Date of Exam: 04/08/2020 Medical Rec #:  [id number]      Height:       63.0 in Accession #:    [id number]     Weight:       141.3 lb Date of Birth:  [birth year]      BSA:          1.668 m Patient Age:    88 years       BP:           163/65 mmHg Patient Gender: F              HR:           56 bpm. Exam Location:  Inpatient Procedure: 2D Echo, 3D Echo and Strain Analysis Indications:    Stroke 434.91 / I163.9  History:        Patient has prior history of Echocardiogram examinations, most                 recent 03/10/2019. CAD; Risk Factors:Hypertension and                 Dyslipidemia.  Sonographer:    Darlina Sicilian RDCS Referring Phys: [id number] [place]  1. Left ventricular ejection fraction, by estimation, is 60 to 65%. The left ventricle has normal function. The left ventricle has no regional wall motion abnormalities. There is mild concentric left ventricular hypertrophy. Left ventricular diastolic parameters are consistent with Grade I diastolic dysfunction (impaired relaxation). Elevated left ventricular end-diastolic pressure. The average left ventricular global longitudinal strain is -18.7 %. The global longitudinal strain is normal.  2. Right ventricular systolic function is normal. The right ventricular size is normal. There is normal pulmonary artery systolic pressure.  3. Left atrial size was severely dilated.  4. The mitral valve is normal in structure. Trivial mitral valve regurgitation. No evidence of mitral stenosis.  5. Tricuspid valve regurgitation is mild to moderate.  6. The aortic valve is tricuspid. Aortic valve regurgitation is not visualized. No aortic stenosis is present.  7. The inferior vena cava is normal in size with <50% respiratory variability, suggesting right atrial pressure of 8 mmHg. FINDINGS  Left Ventricle: Left ventricular ejection fraction, by estimation, is 60 to 65%. The left ventricle has  normal function. The left ventricle has no regional wall motion abnormalities. The average left ventricular global longitudinal strain is -18.7 %. The global  longitudinal strain is normal. The left ventricular internal cavity size was normal in size. There is mild concentric left ventricular hypertrophy. Left ventricular diastolic parameters are consistent with Grade I diastolic dysfunction (impaired relaxation). Elevated left ventricular end-diastolic pressure. Right Ventricle: The right ventricular size is normal. No increase in right ventricular wall thickness. Right ventricular systolic function is normal. There is normal pulmonary artery systolic pressure. The tricuspid regurgitant velocity is 2.50 m/s, and  with an assumed right atrial pressure of 8 mmHg, the estimated right ventricular systolic pressure is 17.5 mmHg. Left Atrium: Left atrial size was severely dilated. Right Atrium: Right atrial size was normal in size. Pericardium: There is no evidence of pericardial effusion. Mitral Valve: The mitral valve is normal in structure. Normal mobility of the mitral valve leaflets. Moderate mitral annular calcification. Trivial mitral valve regurgitation. No evidence of mitral valve stenosis. Tricuspid Valve: The tricuspid valve is normal in structure. Tricuspid valve regurgitation is mild to moderate. No evidence of tricuspid stenosis. Aortic Valve: The aortic valve is tricuspid. . There is moderate thickening and moderate calcification of the aortic valve. Aortic valve regurgitation is not visualized. No aortic stenosis is present. There is moderate thickening of the aortic valve. There is moderate calcification of the aortic valve. Pulmonic Valve: The pulmonic valve was normal in structure. Pulmonic valve regurgitation is mild. No evidence of pulmonic stenosis. Aorta: The aortic root is normal in size and structure. Venous: The inferior vena cava is normal in size with less than 50% respiratory variability,  suggesting right atrial pressure of 8 mmHg. IAS/Shunts: No atrial level shunt detected by color flow Doppler.  LEFT VENTRICLE PLAX 2D LVIDd:         3.30 cm  Diastology LVIDs:         1.90 cm  LV e' lateral:   4.68 cm/s LV PW:         1.20 cm  LV E/e' lateral: 18.1 LV IVS:        1.10 cm  LV e' medial:    5.66 cm/s LVOT diam:     1.80 cm  LV E/e' medial:  14.9 LV SV:         54 LV SV Index:   32       2D Longitudinal Strain LVOT Area:     2.54 cm 2D Strain GLS (A2C):   -18.1 %                         2D Strain GLS (A3C):   -19.2 %                         2D Strain GLS (A4C):   -18.8 %                         2D Strain GLS Avg:     -18.7 %                          3D Volume EF:                         3D EF:        62 %                         LV EDV:  101 ml                         LV ESV:       38 ml                         LV SV:        63 ml RIGHT VENTRICLE TAPSE (M-mode): 2.8 cm LEFT ATRIUM             Index       RIGHT ATRIUM           Index LA diam:        3.60 cm 2.16 cm/m  RA Area:     18.50 cm LA Vol (A2C):   65.8 ml 39.44 ml/m RA Volume:   47.50 ml  28.47 ml/m LA Vol (A4C):   66.1 ml 39.62 ml/m LA Biplane Vol: 66.9 ml 40.10 ml/m  AORTIC VALVE LVOT Vmax:   117.00 cm/s LVOT Vmean:  78.300 cm/s LVOT VTI:    0.211 m  AORTA Ao Root diam: 2.80 cm Ao Asc diam:  3.20 cm MITRAL VALVE                TRICUSPID VALVE MV Area (PHT): 2.60 cm     TR Peak grad:   25.0 mmHg MV Decel Time: 292 msec     TR Vmax:        250.00 cm/s MV E velocity: 84.50 cm/s MV A velocity: 128.00 cm/s  SHUNTS MV E/A ratio:  0.66         Systemic VTI:  0.21 m                             Systemic Diam: 1.80 cm Skeet Latch MD Electronically signed by Skeet Latch MD Signature Date/Time: 04/08/2020/4:41:14 PM    Final    VAS [place] LOWER EXTREMITY VENOUS (DVT)  Result Date: 04/08/2020  Lower Venous DVTStudy Indications: Stroke.  Limitations: Pain. Performing Technologist: Antonieta Pert RDMS, RVT  Examination Guidelines: A  complete evaluation includes B-mode imaging, spectral Doppler, color Doppler, and power Doppler as needed of all accessible portions of each vessel. Bilateral testing is considered an integral part of a complete examination. Limited examinations for reoccurring indications may be performed as noted. The reflux portion of the exam is performed with the patient in reverse Trendelenburg.  +---------+---------------+---------+-----------+----------+--------------+ RIGHT    CompressibilityPhasicitySpontaneityPropertiesThrombus Aging +---------+---------------+---------+-----------+----------+--------------+ CFV      Full           Yes      Yes                                 +---------+---------------+---------+-----------+----------+--------------+ SFJ      Full                                                        +---------+---------------+---------+-----------+----------+--------------+ FV Prox  Full                                                        +---------+---------------+---------+-----------+----------+--------------+  FV Mid   Full                                                        +---------+---------------+---------+-----------+----------+--------------+ FV DistalFull                                                        +---------+---------------+---------+-----------+----------+--------------+ PFV      Full                                                        +---------+---------------+---------+-----------+----------+--------------+ POP      Full           Yes      Yes                                 +---------+---------------+---------+-----------+----------+--------------+ PTV      Full                                                        +---------+---------------+---------+-----------+----------+--------------+ PERO     Full                                                         +---------+---------------+---------+-----------+----------+--------------+ GSV      Full                                                        +---------+---------------+---------+-----------+----------+--------------+ Hypoechoic fliud collection seen mid thigh aprox 6.4 x 3.1 x 1.7cm  +---------+---------------+---------+-----------+----------+--------------+ LEFT     CompressibilityPhasicitySpontaneityPropertiesThrombus Aging +---------+---------------+---------+-----------+----------+--------------+ CFV      Full           Yes      Yes                                 +---------+---------------+---------+-----------+----------+--------------+ SFJ      Full                                                        +---------+---------------+---------+-----------+----------+--------------+ FV Prox  Full                                                        +---------+---------------+---------+-----------+----------+--------------+  FV Mid   Full                                                        +---------+---------------+---------+-----------+----------+--------------+ FV DistalFull                                                        +---------+---------------+---------+-----------+----------+--------------+ PFV      Full                                                        +---------+---------------+---------+-----------+----------+--------------+ POP      Full           Yes      Yes                                 +---------+---------------+---------+-----------+----------+--------------+ PTV      Full                                                        +---------+---------------+---------+-----------+----------+--------------+ PERO     Full                                                        +---------+---------------+---------+-----------+----------+--------------+ GSV      Full                                                         +---------+---------------+---------+-----------+----------+--------------+     Summary: RIGHT: - There is no evidence of deep vein thrombosis in the lower extremity.  - A cystic structure is found in the popliteal fossa. - In addition to Bakers cyst, patient has a large hypoechoic fluid collection mid right thigh.  LEFT: - There is no evidence of deep vein thrombosis in the lower extremity.  - A cystic structure is found in the popliteal fossa.  *See table(s) above for measurements and observations. Electronically signed by Harold Barban MD on 04/08/2020 at 5:37:50 PM.    Final     Cardiac Studies   The LV function is normal.  Patient Profile     86 y.o. female with history of remote CAD of 1st diag and LAD by cath in 2002, HTN, HLD, TIA who is being seen today for the evaluation of elevated troponin .  Presented with garbled speech and found to have CVA, possibly embolic.  Assessment & Plan    1. Elevated troponin I likely related  to demand ischemia.  No evidence for ACS.  LV function by echo was normal.  No ischemic changes on EKG.  No further evaluation is needed. 2. CVA: Given age, she is at risk for paroxysmal atrial fibrillation.  We will be sure to arrange a 30-day monitor at the time of discharge to discover the possible presence which could change management strategy about requiring anticoagulation therapy.  [facility] will sign off.   Medication Recommendations: None Other recommendations (labs, testing, etc): Please contact us at discharge so that continuous monitor can be arranged Follow up as an outpatient: Not required unless atrial fibrillation identified on monitoring.  For questions or updates, please contact [place] Please consult [email or web address] for contact info under        Signed, Sinclair Grooms, MD  04/09/2020, 10:28 AM

## 2020-04-10 LAB — BASIC METABOLIC PANEL
Anion gap: 9 (ref 5–15)
BUN: 21 mg/dL (ref 8–23)
CO2: 22 mmol/L (ref 22–32)
Calcium: 9.3 mg/dL (ref 8.9–10.3)
Chloride: 106 mmol/L (ref 98–111)
Creatinine, Ser: 0.79 mg/dL (ref 0.44–1.00)
GFR calc Af Amer: >60 mL/min (ref >60)
GFR calc non Af Amer: >60 mL/min (ref >60)
Glucose, Bld: 111 mg/dL — ABNORMAL HIGH (ref 70–99)
Potassium: 3.8 mmol/L (ref 3.5–5.1)
Sodium: 137 mmol/L (ref 135–145)

## 2020-04-10 LAB — CBC WITH DIFFERENTIAL/PLATELET
Abs Immature Granulocytes: 0.02 10*3/uL (ref 0.00–0.07)
Basophils Absolute: 0 10*3/uL (ref 0.0–0.1)
Basophils Relative: 0 %
Eosinophils Absolute: 0.2 10*3/uL (ref 0.0–0.5)
Eosinophils Relative: 2 %
HCT: 41.4 % (ref 36.0–46.0)
Hemoglobin: 13.5 g/dL (ref 12.0–15.0)
Immature Granulocytes: 0 %
Lymphocytes Relative: 24 %
Lymphs Abs: 1.5 10*3/uL (ref 0.7–4.0)
MCH: 31 pg (ref 26.0–34.0)
MCHC: 32.6 g/dL (ref 30.0–36.0)
MCV: 95.2 fL (ref 80.0–100.0)
Monocytes Absolute: 0.9 10*3/uL (ref 0.1–1.0)
Monocytes Relative: 13 %
Neutro Abs: 3.8 10*3/uL (ref 1.7–7.7)
Neutrophils Relative %: 61 %
Platelets: 187 10*3/uL (ref 150–400)
RBC: 4.35 MIL/uL (ref 3.87–5.11)
RDW: 14.9 % (ref 11.5–15.5)
WBC: 6.4 10*3/uL (ref 4.0–10.5)
nRBC: 0 % (ref 0.0–0.2)

## 2020-04-10 LAB — URINE CULTURE: Culture: <10000 — AB

## 2020-04-10 LAB — MAGNESIUM: Magnesium: 1.4 mg/dL — ABNORMAL LOW (ref 1.7–2.4)

## 2020-04-10 LAB — SARS CORONAVIRUS 2 BY RT PCR (HOSPITAL ORDER, PERFORMED IN ~~LOC~~ HOSPITAL LAB): SARS Coronavirus 2: NEGATIVE

## 2020-04-10 MED ORDER — LISINOPRIL 10 MG PO TABS
10.0000 mg | ORAL_TABLET | Freq: Once | ORAL | Status: AC
  Administered 2020-04-10: 10 mg via ORAL
  Filled 2020-04-10: qty 1

## 2020-04-10 MED ORDER — ALUM & MAG HYDROXIDE-SIMETH 200-200-20 MG/5ML PO SUSP
15.0000 mL | Freq: Four times a day (QID) | ORAL | Status: DC | PRN
  Administered 2020-04-10: 15 mL via ORAL
  Filled 2020-04-10: qty 30

## 2020-04-10 MED ORDER — MAGNESIUM SULFATE 4 GM/100ML IV SOLN
4.0000 g | Freq: Once | INTRAVENOUS | Status: AC
  Administered 2020-04-10: 4 g via INTRAVENOUS
  Filled 2020-04-10: qty 100

## 2020-04-10 MED ORDER — HYDROCODONE-ACETAMINOPHEN 5-325 MG PO TABS
1.0000 | ORAL_TABLET | Freq: Four times a day (QID) | ORAL | Status: AC | PRN
  Administered 2020-04-10: 1 via ORAL
  Filled 2020-04-10: qty 1

## 2020-04-10 MED ORDER — LISINOPRIL 20 MG PO TABS
20.0000 mg | ORAL_TABLET | Freq: Every day | ORAL | Status: DC
  Administered 2020-04-11: 20 mg via ORAL
  Filled 2020-04-10: qty 1

## 2020-04-10 NOTE — [place] FL2 (Signed)
[place] MEDICAID FL2 LEVEL OF CARE SCREENING TOOL     IDENTIFICATION  Patient Name: [name] Birthdate: Nov 12, 1928 Sex: female Admission Date (Current Location): 04/07/2020  [facility] and [place] Number:  Herbalist and Address:  [facility] [place], [place] [address], [place], [place] [address]      Provider Number: [id number]  Attending Physician Name and Address:  Darliss Cheney, MD  Relative Name and Phone Number:       Current Level of Care: Hospital Recommended Level of Care: [place] Prior Approval Number:    Date Approved/Denied:   PASRR Number: [id number]  Discharge Plan: SNF    Current Diagnoses: Patient Active Problem List   Diagnosis Date Noted  . Dyslipidemia   . Stage 3a chronic kidney disease   . Demand ischemia ([place])   . Upper back pain 04/08/2020  . Thrombocytopenia ([place]) 04/08/2020  . Acute CVA (cerebrovascular accident) ([place]) 04/07/2020  . TIA (transient ischemic attack) 03/09/2019  . Pressure injury of skin 05/21/2018  . Incarcerated incisional hernia - reduced 05/19/2018  . SBO (small bowel obstruction) ([place]) 05/18/2018  . Bilateral primary osteoarthritis of knee 07/22/2017  . Hyperlipidemia 05/20/2014  . Hypotension 08/02/2013  . UTI (lower urinary tract infection) 08/02/2013  . Acute renal failure ([employer]) 08/02/2013  . ARF (acute renal failure) ([place]) 08/02/2013  . Calculus of gallbladder with acute cholecystitis, without mention of obstruction 07/10/2013  . Acute cholecystitis 06/17/2013  . Chest pain 06/17/2013  . Pulmonary infiltrate in left lung on chest x-ray 06/17/2013  . Hypertension   . CAD (coronary artery disease)     Orientation RESPIRATION BLADDER Height & Weight     Self, Place  Normal Incontinent Weight: 141 lb 5 oz (64.1 kg) Height:  5\' 3"  (160 cm)  BEHAVIORAL SYMPTOMS/MOOD NEUROLOGICAL BOWEL NUTRITION STATUS      Continent Diet (regular)  AMBULATORY STATUS COMMUNICATION OF  NEEDS Skin   Extensive Assist Verbally Normal                       Personal Care Assistance Level of Assistance  Bathing, Feeding, Dressing Bathing Assistance: Maximum assistance Feeding assistance: Limited assistance Dressing Assistance: Maximum assistance     Functional Limitations Info  Hearing   Hearing Info: Impaired (hard of hearing)      SPECIAL CARE FACTORS FREQUENCY  PT (By licensed PT), OT (By licensed OT), Speech therapy     PT Frequency: 5x/wk OT Frequency: 5x/wk     Speech Therapy Frequency: 5x/wk      Contractures Contractures Info: Not present    Additional Factors Info  Code Status, Allergies Code Status Info: DNR Allergies Info: Felodipine, Prednisone, Shrimp (Shellfish Allergy), Codeine, Crestor (Rosuvastatin), Feldene (Piroxicam), Lescol (Fluvastatin), Lipitor (Atorvastatin), Nexium (Esomeprazole), Pravachol (Pravastatin), Prilosec Otc (Omeprazole Magnesium), Statins, Welchol (Colesevelam), Zetia (Ezetimibe), Zithromax (Azithromycin), Zocor (Simvastatin), Omeprazole           Current Medications (04/10/2020):  This is the current hospital active medication list Current Facility-Administered Medications  Medication Dose Route Frequency Provider Last Rate Last Admin  . 0.9 %  sodium chloride infusion   Intravenous Continuous Chotiner, Yevonne Aline, MD 75 mL/hr at 04/10/20 0440 Rate Verify at 04/10/20 0440  . 0.9 %  sodium chloride infusion   Intravenous PRN Chotiner, Yevonne Aline, MD   Paused at 04/09/20 [phone]  . acetaminophen (TYLENOL) tablet 650 mg  650 mg Oral Q8H PRN Chotiner, Yevonne Aline, MD   650 mg  at 04/10/20 0730  . aspirin EC tablet 81 mg  81 mg Oral Daily Chotiner, Yevonne Aline, MD   81 mg at 04/10/20 0949  . cefTRIAXone (ROCEPHIN) 1 g in sodium chloride 0.9 % 100 mL IVPB  1 g Intravenous Q24H Darliss Cheney, MD   Stopped at 04/09/20 1810  . clopidogrel (PLAVIX) tablet 75 mg  75 mg Oral Daily Rosalin Hawking, MD   75 mg at 04/10/20 0949  . hydrALAZINE  (APRESOLINE) injection 10 mg  10 mg Intravenous Q6H PRN Darliss Cheney, MD   10 mg at 04/09/20 2056  . labetalol (NORMODYNE) injection 10 mg  10 mg Intravenous Q2H PRN Darliss Cheney, MD      . lisinopril (ZESTRIL) tablet 10 mg  10 mg Oral Daily Rosalin Hawking, MD   10 mg at 04/10/20 0949  . omega-3 acid ethyl esters (LOVAZA) capsule 1 g  1 g Oral Daily Chotiner, Yevonne Aline, MD   1 g at 04/10/20 0949  . ondansetron (ZOFRAN) injection 4 mg  4 mg Intravenous Q6H PRN Opyd, Ilene Qua, MD   4 mg at 04/08/20 1036  . senna-docusate (Senokot-S) tablet 1 tablet  1 tablet Oral QHS PRN Chotiner, Yevonne Aline, MD      . traZODone (DESYREL) tablet 25 mg  25 mg Oral QHS PRN Chotiner, Yevonne Aline, MD         Discharge Medications: Please see discharge summary for a list of discharge medications.  Relevant Imaging Results:  Relevant Lab Results:   Additional Information SS#: [id number]  Geralynn Ochs, LCSW

## 2020-04-10 NOTE — Discharge Instructions (Signed)
 Hospital Discharge After a Stroke  Being discharged from the hospital after a stroke can feel overwhelming. Many things may be different, and it is normal to feel scared or anxious. Some stroke survivors may be able to return to their homes, and others may need more specialized care on a temporary or permanent basis. Your stroke care team will work with you to develop a discharge plan that is best for you. Ask questions if you do not understand something. Invite a friend or family member to participate in discharge planning. Understanding and following your discharge plan can help to prevent another stroke or other problems. Understanding your medicines After a stroke, your health care provider may prescribe one or more types of medicine. It is important to take medicines exactly as told by your health care provider. Serious harm, such as another stroke, can happen if you are unable to take your medicine exactly as prescribed. Make sure you understand:  What medicine to take.  Why you are taking the medicine.  How and when to take it.  If it can be taken with your other medicines and herbal supplements.  Possible side effects.  When to call your health care provider if you have any side effects.  How you will get and pay for your medicines. Medical assistance programs may be able to help you pay for prescription medicines if you cannot afford them. If you are taking an anticoagulant, be sure to take it exactly as told by your health care provider. This type of medicine can increase the risk of bleeding because it works to prevent blood from clotting. You may need to take certain precautions to prevent bleeding. You should contact your health care provider if you have:  Bleeding or bruising.  A fall or other injury to your head.  Blood in your urine or stool (feces). Planning for home safety  Take steps to prevent falls, such as installing grab bars or using a shower chair. Ask a  friend or family member to get needed things in place before you go home if possible. A therapist can come to your home to make recommendations for safety equipment. Ask your health care provider if you would benefit from this service or from home care. Getting needed equipment Ask your health care provider for a list of any medical equipment and supplies you will need at home. These may include items such as:  Walkers.  Canes.  Wheelchairs.  Hand-strengthening devices.  Special eating utensils. Medical equipment can be rented or purchased, depending on your insurance coverage. Check with your insurance company about what is covered. Keeping follow-up visits After a stroke, you will need to follow up regularly with a health care provider. You may also need rehabilitation, which can include physical therapy, occupational therapy, or speech-language therapy. Keeping these appointments is very important to your recovery after a stroke. Be sure to bring your medicine list and discharge papers with you to your appointments. If you need help to keep track of your schedule, use a calendar or appointment reminder. Preventing another stroke Having a stroke puts you at risk for another stroke in the future. Ask your health care provider what actions you can take to lower the risk. These may include:  Increasing how much you exercise.  Making a healthy eating plan.  Quitting smoking.  Managing other health conditions, such as high blood pressure, high cholesterol, or diabetes.  Limiting alcohol use. Knowing the warning signs of a stroke  Make   sure you understand the signs of a stroke. Before you leave the hospital, you will receive information outlining the stroke warning signs. Share these with your friends and family members. "BE FAST" is an easy way to remember the main warning signs of a stroke:  B - Balance. Signs are dizziness, sudden trouble walking, or loss of balance.  E - Eyes.  Signs are trouble seeing or a sudden change in vision.  F - Face. Signs are sudden weakness or numbness of the face, or the face or eyelid drooping on one side.  A - Arms. Signs are weakness or numbness in an arm. This happens suddenly and usually on one side of the body.  S - Speech. Signs are sudden trouble speaking, slurred speech, or trouble understanding what people say.  T - Time. Time to call emergency services. Write down what time symptoms started. Other signs of stroke may include:  A sudden, severe headache with no known cause.  Nausea or vomiting.  Seizure. These symptoms may represent a serious problem that is an emergency. Do not wait to see if the symptoms will go away. Get medical help right away. Call your local emergency services (911 in the [place] Do not drive yourself to the hospital. Make note of the time that you had your first symptoms. Your emergency responders or emergency room staff will need to know this information. Summary  Being discharged from the hospital after a stroke can feel overwhelming. It is normal to feel scared or anxious.  Make sure you take medicines exactly as told by your health care provider.  Know the warning signs of a stroke, and get help right way if you have any of these symptoms. "BE FAST" is an easy way to remember the main warning signs of a stroke. This information is not intended to replace advice given to you by your health care provider. Make sure you discuss any questions you have with your health care provider. Document Revised: 04/25/2019 Document Reviewed: 11/05/2016 Elsevier Patient Education  2020 [employer]  

## 2020-04-10 NOTE — Progress Notes (Addendum)
Inpatient Rehabilitation Admissions Coordinator  I met at bedside with patient and son. Patient not at a level to pursue [place] admission . Son in agreement that she is unable to do the intensity required. She will need SNF. I have alerted TOC, Kathlee Nations, acute team and MD. We will signoff at this time.  Danne Baxter, RN, MSN Rehab Admissions Coordinator [phone] 04/10/2020 12:02 PM

## 2020-04-10 NOTE — Progress Notes (Signed)
PROGRESS NOTE  Unable to see pt today.  Note she has changed her disposition to SNF and that will necessitate a frequency change to seeing pt a minimum of 2x /week. 04/10/2020  Ginger Carne., PT Acute Rehabilitation Services [phone]  (pager) [phone]  (office)

## 2020-04-10 NOTE — Progress Notes (Signed)
PROGRESS NOTE    [name]  TMH:[id number] DOB: 1928-12-19 DOA: 04/07/2020 PCP: Lajean Manes, MD   Brief Narrative:  [name] is a 86 year old female with medical history significant for HTN, CAD, HLD who presented to the hospital on 8/23 with new onset slurred speech witnessed by her son and was found to have elevated blood pressure of 240/83 and 6 mm acute ischemic nonhemorrhagic cortical infarct of the inferior left frontal lobe.  Patient was evaluated by neurology who recommended aspirin, Plavix, further imaging for stroke work-up including TTE and CTA head and neck as well as further work-up for potential metabolic abnormalities.  Assessment & Plan:   Principal Problem:   Acute CVA (cerebrovascular accident) ([place]) Active Problems:   Hypertension   CAD (coronary artery disease)   Upper back pain   Thrombocytopenia (HCC)   Dyslipidemia   Stage 3a chronic kidney disease   Demand ischemia ([place])   Acute ischemic CVA: Left frontal lobe cortical small infarct, embolic pattern, secondary to unknown source.  Suspicious for atrial fibrillation.  CT head unremarkable.  MRI with left frontal lobe cortical infarct.  Old left thalamic lacunar infarct.  CT angiogram of head and neck shows acute proximal left M3 occlusion.  Seen by neurology yesterday and they recommended continuing DAPT with aspirin and Plavix for 3 weeks and then Plavix alone as she was on aspirin before.  PT OT has seen her and they recommended CIR.  CIR working on [employer] production [employer] authorization for her.  Her blood pressure still is significantly elevated despite of starting her on lisinopril 10 mg yesterday.  Now she has been at least 72 hours out of her initial symptoms.  I will increase lisinopril to 20 mg and continue as needed hydralazine and labetalol.  Cardiology to arrange outpatient 30-day event monitor.   CAP//atelectasis: I do not think patient has any pneumonia.  She was afebrile with no  leukocytosis.  Chest x-ray shows atelectasis.  I will discontinue antibiotics.  UTI: UA consistent with UTI however it is very hard to determine as patient is so hard of hearing that she cannot tell us if she has any symptoms.  Urine culture unremarkable.  We will continue Rocephin through tomorrow.  Noted cardiomegaly and pulmonary edema on chest x-ray.  Likely consistent with acute on chronic diastolic CHF (last TTE in 2020).  No obvious peripheral edema, no current O2 requirements.  Received IV Lasix yesterday.  She is euvolemic.  No more diuretics at least today.  Elevated high-sensitivity troponin: Seen by cardiology.  Suspect demand ischemia in the setting of acute CVA, hypertensive emergency, CAP/UTI.  No ischemic changes on EKG. no wall motion abnormality on echo.  PT initially recommended CIR however per CIR, she is not a good candidate for them.  Now looking for SNF.  Per TOC, she will be discharged tomorrow.  Covid test ordered.  DVT prophylaxis:    Code Status: DNR  Family Communication:  None present at bedside.    Status is: Inpatient  Remains inpatient appropriate because:Inpatient level of care appropriate due to severity of illness   Dispo: The patient is from: Home              Anticipated d/c is to:SNF              Anticipated d/c date is: 1 day              Patient currently is medically stable to d/c.  Estimated body mass index is 25.03 kg/m as calculated from the following:   Height as of this encounter: 5\' 3"  (1.6 m).   Weight as of this encounter: 64.1 kg.  Pressure Injury 05/21/18 Stage II -  Partial thickness loss of dermis presenting as a shallow open ulcer with a red, pink wound bed without slough. (Active)  05/21/18 1052  Location: Buttocks  Location Orientation: Left  Staging: Stage II -  Partial thickness loss of dermis presenting as a shallow open ulcer with a red, pink wound bed without slough.  Wound Description (Comments):   Present  on Admission:      Pressure Injury 05/21/18 Stage II -  Partial thickness loss of dermis presenting as a shallow open ulcer with a red, pink wound bed without slough. (Active)  05/21/18 1052  Location: Buttocks  Location Orientation:   Staging: Stage II -  Partial thickness loss of dermis presenting as a shallow open ulcer with a red, pink wound bed without slough.  Wound Description (Comments):   Present on Admission:      Nutritional status:               Consultants:   Neurology  Cardiology  Procedures:   None  Antimicrobials:  Anti-infectives (From admission, onward)   Start     Dose/Rate Route Frequency Ordered Stop   04/09/20 1730  cefTRIAXone (ROCEPHIN) 1 g in sodium chloride 0.9 % 100 mL IVPB        1 g 200 mL/hr over 30 Minutes Intravenous Every 24 hours 04/09/20 1653     04/08/20 0600  doxycycline (VIBRAMYCIN) 100 mg in sodium chloride 0.9 % 250 mL IVPB  Status:  Discontinued        100 mg 125 mL/hr over 120 Minutes Intravenous Every 12 hours 04/08/20 0317 04/09/20 1653   04/08/20 0400  cefTRIAXone (ROCEPHIN) 1 g in sodium chloride 0.9 % 100 mL IVPB  Status:  Discontinued        1 g 200 mL/hr over 30 Minutes Intravenous Every 24 hours 04/08/20 0317 04/09/20 1653         Subjective: Patient seen and examined.  She is hard of hearing but she is completely alert and oriented.  She has fluent speech.  Objective: Vitals:   04/10/20 0353 04/10/20 0842 04/10/20 1110 04/10/20 1205  BP: (!) 164/64 (!) 193/81 (!) 183/75 (!) 179/67  Pulse: 66 (!) 56 (!) 58 (!) 58  Resp: 17 17 19 13   Temp: 97.6 F (36.4 C) 98 F (36.7 C) 97.8 F (36.6 C) 98.5 F (36.9 C)  TempSrc: Oral Oral Oral Oral  SpO2: 96% 94% 96% 95%  Weight:      Height:        Intake/Output Summary (Last 24 hours) at 04/10/2020 1358 Last data filed at 04/10/2020 1113 Gross per 24 hour  Intake 1813.95 ml  Output 250 ml  Net 1563.95 ml   Filed Weights   04/07/20 2138 04/08/20 0257    Weight: 67.9 kg 64.1 kg    Examination:  General exam: Appears calm and comfortable  Respiratory system: Clear to auscultation. Respiratory effort normal. Cardiovascular system: S1 & S2 heard, RRR. No JVD, murmurs, rubs, gallops or clicks. No pedal edema. Gastrointestinal system: Abdomen is nondistended, soft and nontender. No organomegaly or masses felt. Normal bowel sounds heard. Central nervous system: Alert and oriented. No focal neurological deficits. Extremities: Symmetric 5 x 5 power. Skin: No rashes, lesions or ulcers.  Psychiatry: Judgement and insight  appear normal. Mood & affect appropriate.   Data Reviewed: I have personally reviewed following labs and imaging studies  CBC: Recent Labs  Lab 04/07/20 2009 04/07/20 2026 04/08/20 0741 04/09/20 0149 04/10/20 0357  WBC 6.7  --  8.9 7.3 6.4  NEUTROABS 4.1  --   --  4.6 3.8  HGB 14.1 14.6 13.9 13.5 13.5  HCT 43.4 43.0 41.6 40.3 41.4  MCV 96.4  --  95.2 95.0 95.2  PLT 126*  --  194 162 161   Basic Metabolic Panel: Recent Labs  Lab 04/07/20 2009 04/07/20 2009 04/07/20 2026 04/07/20 2205 04/08/20 0741 04/09/20 0149 04/10/20 0357  NA 137  --  135  --  139 136 137  K >7.5*   < > 7.6* 4.2 3.9 4.7 3.8  CL 103  --  105  --  104 102 106  CO2 24  --   --   --  25 23 22   GLUCOSE 92  --  90  --  136* 106* 111*  BUN 30*  --  51*  --  22 23 21   CREATININE 0.94  --  0.90  --  0.83 0.89 0.79  CALCIUM 9.5  --   --   --  9.5 9.1 9.3  MG  --   --   --   --   --   --  1.4*   < > = values in this interval not displayed.   GFR: Estimated Creatinine Clearance: 43 mL/min (by C-G formula based on SCr of 0.79 mg/dL). Liver Function Tests: Recent Labs  Lab 04/07/20 2009 04/08/20 0741  AST 60* 25  ALT 13 20  ALKPHOS 59 55  BILITOT 2.2* 0.8  PROT 5.5* 5.4*  ALBUMIN 3.5 3.3*   No results for input(s): LIPASE, AMYLASE in the last 168 hours. No results for input(s): AMMONIA in the last 168 hours. Coagulation  Profile: Recent Labs  Lab 04/07/20 2009  INR 1.0   Cardiac Enzymes: No results for input(s): CKTOTAL, CKMB, CKMBINDEX, TROPONINI in the last 168 hours. BNP (last 3 results) No results for input(s): PROBNP in the last 8760 hours. HbA1C: Recent Labs    04/08/20 0741  HGBA1C 6.2*   CBG: Recent Labs  Lab 04/08/20 0619 04/09/20 2244  GLUCAP 136* 108*   Lipid Profile: Recent Labs    04/08/20 0135  CHOL 226*  HDL 65  LDLCALC 144*  TRIG 83  CHOLHDL 3.5   Thyroid Function Tests: No results for input(s): TSH, T4TOTAL, FREET4, T3FREE, THYROIDAB in the last 72 hours. Anemia Panel: No results for input(s): VITAMINB12, FOLATE, FERRITIN, TIBC, IRON, RETICCTPCT in the last 72 hours. Sepsis Labs: No results for input(s): PROCALCITON, LATICACIDVEN in the last 168 hours.  Recent Results (from the past 240 hour(s))  SARS Coronavirus 2 by RT PCR (hospital order, performed in [facility] hospital lab) Nasopharyngeal Nasopharyngeal Swab     Status: None   Collection Time: 04/07/20  8:09 PM   Specimen: Nasopharyngeal Swab  Result Value Ref Range Status   SARS Coronavirus 2 NEGATIVE NEGATIVE Final    Comment: (NOTE) SARS-CoV-2 target nucleic acids are NOT DETECTED.  The SARS-CoV-2 RNA is generally detectable in upper and lower respiratory specimens during the acute phase of infection. The lowest concentration of SARS-CoV-2 viral copies this assay can detect is 250 copies / mL. A negative result does not preclude SARS-CoV-2 infection and should not be used as the sole basis for treatment or other patient management decisions.  A  negative result may occur with improper specimen collection / handling, submission of specimen other than nasopharyngeal swab, presence of viral mutation(s) within the areas targeted by this assay, and inadequate number of viral copies (<250 copies / mL). A negative result must be combined with clinical observations, patient history, and epidemiological  information.  Fact Sheet for Patients:   StrictlyIdeas.no  Fact Sheet for Healthcare Providers: BankingDealers.co.za  This test is not yet approved or  cleared by the [place] FDA and has been authorized for detection and/or diagnosis of SARS-CoV-2 by FDA under an Emergency Use Authorization (EUA).  This EUA will remain in effect (meaning this test can be used) for the duration of the COVID-19 declaration under Section 564(b)(1) of the Act, 21 U.S.C. section 360bbb-3(b)(1), unless the authorization is terminated or revoked sooner.  Performed at [facility], [place] [address] [place],  [address]   Culture, Urine     Status: Abnormal   Collection Time: 04/08/20  6:30 PM   Specimen: Urine, Random  Result Value Ref Range Status   Specimen Description URINE, RANDOM  Final   Special Requests NONE  Final   Culture (A)  Final    <10,000 COLONIES/mL INSIGNIFICANT GROWTH Performed at [facility], [place] [address], [place],  [address]    Report Status 04/10/2020 FINAL  Final      Radiology Studies: ECHOCARDIOGRAM COMPLETE  Result Date: 04/08/2020    ECHOCARDIOGRAM REPORT   Patient Name:   [name] Date of Exam: 04/08/2020 Medical Rec #:  [id number]      Height:       63.0 in Accession #:    [id number]     Weight:       141.3 lb Date of Birth:  04-28-1929      BSA:          1.668 m Patient Age:    78 years       BP:           163/65 mmHg Patient Gender: F              HR:           56 bpm. Exam Location:  Inpatient Procedure: 2D Echo, 3D Echo and Strain Analysis Indications:    Stroke 434.91 / I163.9  History:        Patient has prior history of Echocardiogram examinations, most                 recent 03/10/2019. CAD; Risk Factors:Hypertension and                 Dyslipidemia.  Sonographer:    Darlina Sicilian RDCS Referring Phys: [id number] [place]  1. Left ventricular ejection fraction, by  estimation, is 60 to 65%. The left ventricle has normal function. The left ventricle has no regional wall motion abnormalities. There is mild concentric left ventricular hypertrophy. Left ventricular diastolic parameters are consistent with Grade I diastolic dysfunction (impaired relaxation). Elevated left ventricular end-diastolic pressure. The average left ventricular global longitudinal strain is -18.7 %. The global longitudinal strain is normal.  2. Right ventricular systolic function is normal. The right ventricular size is normal. There is normal pulmonary artery systolic pressure.  3. Left atrial size was severely dilated.  4. The mitral valve is normal in structure. Trivial mitral valve regurgitation. No evidence of mitral stenosis.  5. Tricuspid valve regurgitation is mild to moderate.  6. The aortic valve is  tricuspid. Aortic valve regurgitation is not visualized. No aortic stenosis is present.  7. The inferior vena cava is normal in size with <50% respiratory variability, suggesting right atrial pressure of 8 mmHg. FINDINGS  Left Ventricle: Left ventricular ejection fraction, by estimation, is 60 to 65%. The left ventricle has normal function. The left ventricle has no regional wall motion abnormalities. The average left ventricular global longitudinal strain is -18.7 %. The global longitudinal strain is normal. The left ventricular internal cavity size was normal in size. There is mild concentric left ventricular hypertrophy. Left ventricular diastolic parameters are consistent with Grade I diastolic dysfunction (impaired relaxation). Elevated left ventricular end-diastolic pressure. Right Ventricle: The right ventricular size is normal. No increase in right ventricular wall thickness. Right ventricular systolic function is normal. There is normal pulmonary artery systolic pressure. The tricuspid regurgitant velocity is 2.50 m/s, and  with an assumed right atrial pressure of 8 mmHg, the estimated right  ventricular systolic pressure is 89.3 mmHg. Left Atrium: Left atrial size was severely dilated. Right Atrium: Right atrial size was normal in size. Pericardium: There is no evidence of pericardial effusion. Mitral Valve: The mitral valve is normal in structure. Normal mobility of the mitral valve leaflets. Moderate mitral annular calcification. Trivial mitral valve regurgitation. No evidence of mitral valve stenosis. Tricuspid Valve: The tricuspid valve is normal in structure. Tricuspid valve regurgitation is mild to moderate. No evidence of tricuspid stenosis. Aortic Valve: The aortic valve is tricuspid. . There is moderate thickening and moderate calcification of the aortic valve. Aortic valve regurgitation is not visualized. No aortic stenosis is present. There is moderate thickening of the aortic valve. There is moderate calcification of the aortic valve. Pulmonic Valve: The pulmonic valve was normal in structure. Pulmonic valve regurgitation is mild. No evidence of pulmonic stenosis. Aorta: The aortic root is normal in size and structure. Venous: The inferior vena cava is normal in size with less than 50% respiratory variability, suggesting right atrial pressure of 8 mmHg. IAS/Shunts: No atrial level shunt detected by color flow Doppler.  LEFT VENTRICLE PLAX 2D LVIDd:         3.30 cm  Diastology LVIDs:         1.90 cm  LV e' lateral:   4.68 cm/s LV PW:         1.20 cm  LV E/e' lateral: 18.1 LV IVS:        1.10 cm  LV e' medial:    5.66 cm/s LVOT diam:     1.80 cm  LV E/e' medial:  14.9 LV SV:         54 LV SV Index:   32       2D Longitudinal Strain LVOT Area:     2.54 cm 2D Strain GLS (A2C):   -18.1 %                         2D Strain GLS (A3C):   -19.2 %                         2D Strain GLS (A4C):   -18.8 %                         2D Strain GLS Avg:     -18.7 %  3D Volume EF:                         3D EF:        62 %                         LV EDV:       101 ml                          LV ESV:       38 ml                         LV SV:        63 ml RIGHT VENTRICLE TAPSE (M-mode): 2.8 cm LEFT ATRIUM             Index       RIGHT ATRIUM           Index LA diam:        3.60 cm 2.16 cm/m  RA Area:     18.50 cm LA Vol (A2C):   65.8 ml 39.44 ml/m RA Volume:   47.50 ml  28.47 ml/m LA Vol (A4C):   66.1 ml 39.62 ml/m LA Biplane Vol: 66.9 ml 40.10 ml/m  AORTIC VALVE LVOT Vmax:   117.00 cm/s LVOT Vmean:  78.300 cm/s LVOT VTI:    0.211 m  AORTA Ao Root diam: 2.80 cm Ao Asc diam:  3.20 cm MITRAL VALVE                TRICUSPID VALVE MV Area (PHT): 2.60 cm     TR Peak grad:   25.0 mmHg MV Decel Time: 292 msec     TR Vmax:        250.00 cm/s MV E velocity: 84.50 cm/s MV A velocity: 128.00 cm/s  SHUNTS MV E/A ratio:  0.66         Systemic VTI:  0.21 m                             Systemic Diam: 1.80 cm Skeet Latch MD Electronically signed by Skeet Latch MD Signature Date/Time: 04/08/2020/4:41:14 PM    Final     Scheduled Meds: . aspirin EC  81 mg Oral Daily  . clopidogrel  75 mg Oral Daily  . lisinopril  10 mg Oral Once  . [START ON 04/11/2020] lisinopril  20 mg Oral Daily  . omega-3 acid ethyl esters  1 g Oral Daily   Continuous Infusions: . sodium chloride 75 mL/hr at 04/10/20 0440  . sodium chloride Stopped (04/09/20 0950)  . cefTRIAXone (ROCEPHIN)  IV Stopped (04/09/20 1810)     LOS: 2 days   Time spent: 30 minutes   Darliss Cheney, MD [facility]  04/10/2020, 1:58 PM   To contact the attending provider between 7A-7P or the covering provider during after hours 7P-7A, please log into the web site [email or web address].

## 2020-04-10 NOTE — TOC Initial Note (Signed)
Transition of Care [facility]) - Initial/Assessment Note    Patient Details  Name: [name] MRN: [id number] Date of Birth: February 08, 1929  [facility] of [facility]) CM/SW Contact:    Geralynn Ochs, LCSW Phone Number: 04/10/2020, 4:38 PM  Clinical Narrative:     CSW met with patient and son at bedside to discuss SNF placement. [facility] previously did not have a bed available, so CSW discussed with son other options. Sent referral to [facility], but they are full, and sent referral to other SNFs in area. [facility] then had a bed become available and they can admit the patient tomorrow. CSW updated patient and son, and they were appreciative of information. CSW updated MD. CSW to follow.              Expected Discharge Plan: Skilled Nursing Facility Barriers to Discharge: Insurance Authorization, Continued Medical Work up   Patient Goals and CMS Choice Patient states their goals for this hospitalization and ongoing recovery are:: patient unable to participate in goal setting due to disorientation CMS Medicare.gov Compare Post Acute Care list provided to:: Patient Represenative (must comment) Choice offered to / list presented to : Adult Children  Expected Discharge Plan and Services Expected Discharge Plan: [place] Choice: [place] Living arrangements for the past 2 months: [place] Expected Discharge Date: 04/10/20                                    Prior Living Arrangements/Services Living arrangements for the past 2 months: [place] Lives with:: Facility Resident Patient language and need for interpreter reviewed:: No Do you feel safe going back to the place where you live?: Yes      Need for Family Participation in Patient Care: Yes (Comment) Care giver support system in place?: No (comment)   Criminal Activity/Legal Involvement Pertinent to Current Situation/Hospitalization:  No - Comment as needed  Activities of Daily Living      Permission Sought/Granted Permission sought to share information with : Facility Sport and exercise psychologist, Family Supports Permission granted to share information with : Yes, Verbal Permission Granted  Share Information with NAME: [name]  Permission granted to share info w AGENCY: SNF  Permission granted to share info w Relationship: Son     Emotional Assessment Appearance:: Appears stated age Attitude/Demeanor/Rapport: Unable to Assess Affect (typically observed): Unable to Assess Orientation: : Oriented to Self, Oriented to Place Alcohol / Substance Use: Not Applicable Psych Involvement: No (comment)  Admission diagnosis:  Hypertensive encephalopathy [I67.4] Dysarthria [R47.1] Acute CVA (cerebrovascular accident) ([place]) [I63.9] Cerebrovascular accident (CVA), unspecified mechanism ([place]) [I63.9] Patient Active Problem List   Diagnosis Date Noted  . Dyslipidemia   . Stage 3a chronic kidney disease   . Demand ischemia ([place])   . Upper back pain 04/08/2020  . Thrombocytopenia ([place]) 04/08/2020  . Acute CVA (cerebrovascular accident) ([place]) 04/07/2020  . TIA (transient ischemic attack) 03/09/2019  . Pressure injury of skin 05/21/2018  . Incarcerated incisional hernia - reduced 05/19/2018  . SBO (small bowel obstruction) ([place]) 05/18/2018  . Bilateral primary osteoarthritis of knee 07/22/2017  . Hyperlipidemia 05/20/2014  . Hypotension 08/02/2013  . UTI (lower urinary tract infection) 08/02/2013  . Acute renal failure ([place]) 08/02/2013  . ARF (acute renal failure) ([place]) 08/02/2013  . Calculus of gallbladder with acute cholecystitis, without mention of obstruction 07/10/2013  . Acute cholecystitis  06/17/2013  . Chest pain 06/17/2013  . Pulmonary infiltrate in left lung on chest x-ray 06/17/2013  . Hypertension   . CAD (coronary artery disease)    PCP:  Lajean Manes, MD Pharmacy:   [facility] [place], [place] - [address] [place] [address] Phone: [phone] Fax: [phone]  [facility]) - [place], [address] [place] [address] Phone: [phone] Fax: [phone]     Social Determinants of Health (SDOH) Interventions    Readmission Risk Interventions No flowsheet data found.

## 2020-04-11 LAB — CBC WITH DIFFERENTIAL/PLATELET
Abs Immature Granulocytes: 0.02 10*3/uL (ref 0.00–0.07)
Basophils Absolute: 0 10*3/uL (ref 0.0–0.1)
Basophils Relative: 0 %
Eosinophils Absolute: 0.3 10*3/uL (ref 0.0–0.5)
Eosinophils Relative: 4 %
HCT: 42.3 % (ref 36.0–46.0)
Hemoglobin: 13.9 g/dL (ref 12.0–15.0)
Immature Granulocytes: 0 %
Lymphocytes Relative: 17 %
Lymphs Abs: 1.3 10*3/uL (ref 0.7–4.0)
MCH: 31.3 pg (ref 26.0–34.0)
MCHC: 32.9 g/dL (ref 30.0–36.0)
MCV: 95.3 fL (ref 80.0–100.0)
Monocytes Absolute: 1 10*3/uL (ref 0.1–1.0)
Monocytes Relative: 13 %
Neutro Abs: 4.9 10*3/uL (ref 1.7–7.7)
Neutrophils Relative %: 66 %
Platelets: 197 10*3/uL (ref 150–400)
RBC: 4.44 MIL/uL (ref 3.87–5.11)
RDW: 15.1 % (ref 11.5–15.5)
WBC: 7.5 10*3/uL (ref 4.0–10.5)
nRBC: 0 % (ref 0.0–0.2)

## 2020-04-11 MED ORDER — ASPIRIN 81 MG PO TBEC: 81.0000 mg | DELAYED_RELEASE_TABLET | Freq: Every day | ORAL | 0 refills | Status: AC

## 2020-04-11 MED ORDER — LABETALOL HCL 100 MG PO TABS
100.0000 mg | ORAL_TABLET | Freq: Two times a day (BID) | ORAL | Status: DC
  Administered 2020-04-11: 100 mg via ORAL
  Filled 2020-04-11: qty 1

## 2020-04-11 MED ORDER — CLOPIDOGREL BISULFATE 75 MG PO TABS: 75.0000 mg | ORAL_TABLET | Freq: Every day | ORAL | 0 refills | Status: DC

## 2020-04-11 MED ORDER — LABETALOL HCL 100 MG PO TABS: 100.0000 mg | ORAL_TABLET | Freq: Two times a day (BID) | ORAL | 0 refills | Status: DC

## 2020-04-11 MED ORDER — LISINOPRIL 20 MG PO TABS
20.0000 mg | ORAL_TABLET | Freq: Every day | ORAL | 0 refills | Status: DC
Start: 2020-04-12 — End: 2021-07-19

## 2020-04-11 NOTE — Progress Notes (Signed)
  Speech Language Pathology Treatment: Dysphagia;Cognitive-Linquistic  Patient Details Name: [name] MRN: [id number] DOB: 1929-06-06 Today's Date: 04/11/2020 Time: 3614-4315 SLP Time Calculation (min) (ACUTE ONLY): 20 min  Assessment / Plan / Recommendation Clinical Impression  Pt was seen for skilled ST targeting dysphagia and communication.  Upon arrival, pt was awake, alert, and pleasantly interactive.  Pt requested to lay the head of her bed down due to discomfort in neck and lower back.  When asked if she has had trouble swallowing in the past she said "Every once in a while, I was supposed to go to the doctor but we had to cancel it. I'll go back to it" which SLP suspects is referring to plans for esophageal dilation reported in bedside swallow.   Pt was also able to convey that her food would "stop" every once in a while, pointing at the sternal notch.   Pt consumed regular textures and thin liquids with no overt s/s of aspiration or evidence of globus sensation.  Recommend that pt continue on her currently prescribed diet of regular textures and thin liquids with intermittent supervision for use of swallowing precautions.  Subjectively, pt's functional communication appears to be improving in comparison to previous ST reports.  Pt recognized 100% of basic, familiar objects with max faded to mod assist multimodal cues (gestures, facial expression), named targeted objects with ~80% accuracy with mod-max assist multimodal cues and appears to have improving awareness of her current challenges in communication as evidenced by her stating "I hope this (pointing to her mouth) gets better soon."   Nurse arrived to assess pt and administer morning meds.  Pt was able to convey that she was having 9/10 back pain in her neck and lower back.  Pt was left in bed with bed alarm set and call bell within reach.  Continue per current plan of care.    HPI HPI: STEFHANIE KACHMAR is a 86 y.o. female with medical  history significant for hypertension, CAD, hyperlipidemia, osteoarthritis presents to the hospital for evaluation of slurred speech. MR Brain revealed 6 mm acute ischemic nonhemorrhagic cortical infarct involving the inferior left frontal lobe.      SLP Plan  Continue with current plan of care       Recommendations  Diet recommendations: Regular;Thin liquid Liquids provided via: Straw;Cup Medication Administration: Whole meds with liquid Supervision: Patient able to self feed Compensations: Minimize environmental distractions;Slow rate;Small sips/bites;Follow solids with liquid Postural Changes and/or Swallow Maneuvers: Seated upright 90 degrees;Upright 30-60 min after meal                Oral Care Recommendations: Oral care BID Follow up Recommendations: Skilled Nursing facility SLP Visit Diagnosis: Aphasia (R47.01);Dysphagia, pharyngoesophageal phase (R13.14) Plan: Continue with current plan of care       GO                Aaliyah Gavel, Selinda Orion 04/11/2020, 9:26 AM

## 2020-04-11 NOTE — Plan of Care (Signed)
  Problem: Education: Goal: Knowledge of patient specific risk factors addressed and post discharge goals established will improve Outcome: Adequate for Discharge   Problem: Education: Goal: Knowledge of General Education information will improve Description: Including pain rating scale, medication(s)/side effects and non-pharmacologic comfort measures Outcome: Adequate for Discharge   Problem: Health Behavior/Discharge Planning: Goal: Ability to manage health-related needs will improve Outcome: Adequate for Discharge   Problem: Clinical Measurements: Goal: Ability to maintain clinical measurements within normal limits will improve Outcome: Adequate for Discharge Goal: Will remain free from infection Outcome: Adequate for Discharge Goal: Diagnostic test results will improve Outcome: Adequate for Discharge Goal: Respiratory complications will improve Outcome: Adequate for Discharge Goal: Cardiovascular complication will be avoided Outcome: Adequate for Discharge   Problem: Activity: Goal: Risk for activity intolerance will decrease Outcome: Adequate for Discharge   Problem: Nutrition: Goal: Adequate nutrition will be maintained Outcome: Adequate for Discharge   Problem: Coping: Goal: Level of anxiety will decrease Outcome: Adequate for Discharge   Problem: Elimination: Goal: Will not experience complications related to bowel motility Outcome: Adequate for Discharge Goal: Will not experience complications related to urinary retention Outcome: Adequate for Discharge   Problem: Pain Managment: Goal: General experience of comfort will improve Outcome: Adequate for Discharge   Problem: Safety: Goal: Ability to remain free from injury will improve Outcome: Adequate for Discharge   Problem: Skin Integrity: Goal: Risk for impaired skin integrity will decrease Outcome: Adequate for Discharge

## 2020-04-11 NOTE — TOC Transition Note (Signed)
Transition of Care [facility]) - CM/SW Discharge Note   Patient Details  Name: [name] MRN: [id number] Date of Birth: 26-Jan-1929  [facility] CM/SW Contact:  Geralynn Ochs, LCSW Phone Number: 04/11/2020, 11:55 AM   Clinical Narrative:   Nurse to call report to [phone], Room 307.  Transport set for 1:30 PM.    Final next level of care: [place] Barriers to Discharge: Barriers Resolved   Patient Goals and CMS Choice Patient states their goals for this hospitalization and ongoing recovery are:: patient unable to participate in goal setting due to disorientation CMS Medicare.gov Compare Post Acute Care list provided to:: Patient Represenative (must comment) Choice offered to / list presented to : Adult Children  Discharge Placement              Patient chooses bed at: [facility] Patient to be transferred to facility by: [facility] Name of family member notified: [name] Patient and family notified of of transfer: 04/11/20  Discharge Plan and Services     Post Acute Care Choice: [facility]                               Social Determinants of Health (SDOH) Interventions     Readmission Risk Interventions No flowsheet data found.

## 2020-04-11 NOTE — Discharge Summary (Signed)
Physician Discharge Summary  [name] XBM:[id number] DOB: 07-Oct-1928 DOA: 04/07/2020  PCP: Lajean Manes, MD  Admit date: 04/07/2020 Discharge date: 04/11/2020  Recommendations for Outpatient Follow-up:  1. Discharge to SNF 2. Follow up with PCP in 7-10 days after discharge from SNF 3. Follow up with Stroke Clinic at [facility] in 4 weeks. 4. ASA and Plavix for 3 weeks, then Plavix only. 5. Follow up with Cardiology as directed.   Follow-up Information    [facility] Follow up.   Specialty: Cardiology Why: office will call to arrange  Contact information: [address], [address] [place]       Belva Crome, MD Follow up.   Specialty: Cardiology Why: office will call for appt date and time. Contact information: [address] [place] [address] [phone]        Frann Rider, [place]. Schedule an appointment as soon as possible for a visit in 4 week(s).   Specialty: Neurology Contact information: [address] [place] [address] [phone]        Lajean Manes, MD Follow up in 1 week(s).   Specialty: Internal Medicine Contact information: [address] [place] [address] [phone]              Discharge Diagnoses: Principal diagnosis is #1 1. Acute CVA 2. Hypertension 3. CAD 4. Atelectasis 5. Advanced Age.  Discharge Condition: Fair  Disposition: SNF  Diet recommendation: Heart healthy  Filed Weights   04/07/20 2138 04/08/20 0257  Weight: 67.9 kg 64.1 kg    History of present illness:   [name] is a 86 y.o. female with medical history significant for hypertension, CAD, hyperlipidemia, osteoarthritis presents to the hospital for evaluation of slurred speech.  She lives independently alone and has good normal function with self-care and can perform ADLs.  Her son speaks to her 2 times a day.  He is  in the room with patient.  He states that he spoke to his mother around 12:30 in the afternoon this 23rd 2021 and she was her normal self.  When he spoke to her again later around 6 PM on the phone she had very garbled slurred speech that he was not able to understand she had difficulty finding words to say.  He contacted EMS and patient was brought to the hospital.  Does have a history of being hard of hearing.  She denies any numbness or tingling in her extremities and has not had any facial droop according to the son.  Son reports that her speech is improved from when he spoke to on the phone earlier and is almost back to baseline.  Still some mild slurring and seems to be going to find the right words to say.  Patient is complaining of some severe pain in her upper back that does not radiate.  She reports no chest pain, pressure, palpitations, nausea, vomiting, diarrhea.  She has had no recent fever, cough or rash. No tobacco, alcohol, illicit drug use.  ED Course: In the emergency room she is found to have elevated blood pressure of 165-250/78-210.  She been given 1 dose of hydralazine in the emergency room.  I have also ordered 0.5 mg of Dilaudid for her severe back pain.  Initially her potassium was reported as elevated at 7.6 but sample severely hemolyzed.  One sample was redrawn potassium was normal.  MRI was obtained which  showed small left inferior frontal cortical infarct.  Hospital Course:  [facility] hospitalists were consulted to admit the patient for further evaluation and care. [name] is a 86 year old female with medical history significant for HTN, CAD, HLD who presented to the hospital on 8/23 with new onset slurred speech witnessed by her son and was found to have elevated blood pressure of 240/83 and 6 mm acute ischemic nonhemorrhagic cortical infarct of the inferior left frontal lobe.  Patient was evaluated by neurology who recommended aspirin, Plavix, further imaging for stroke  work-up including TTE and CTA head and neck as well as further work-up for potential metabolic abnormalities.  CT head demonstrated no acute findings, but MRI demonstrated a left frontal lobe cortical small infarct, embolic pattern that is secondary to an unknown source. Possibly due to atrial fibrillation. CTA heand and neck showed acute proximal left M3 occlusion. Echocardiogram demonstrated an EFof 60-65% with normal function and no wall motion abnormalities. She has grade 1 diastolic dysfunction.   Neurology has cleared her for discharge to SNF. She will need to take ASA and Plavix both for 3 weeks and then Plavix only. She will follow up with neurology in stroke clinic in 4 weeks.  The patient was also evaluated by cardiology. They likewise recommended ASA and Plavix. They also plan for a 30 day event monitor. They have recommended PCSK9 as outpatient as the patient is allergic to many statins.   The patient's blood pressure has remained an issue since she presented with BP 249/110. Blood pressures are currently under fair control with oral lisinopril and labetalol.   She will be discharged to SNF today.  Today's assessment: S: The patient is resting comfortably. No new complaints. O: Vitals:  Vitals:   04/11/20 0921 04/11/20 0925  BP: (!) 161/62   Pulse:    Resp:  19  Temp:    SpO2:      Exam:  Constitutional:  . The patient is resting comfortably. No acute distress. Respiratory:  . No increased work of breathing. . No wheezes, rales, or rhonchi . No tactile fremitus Cardiovascular:  . Regular rate and rhythm . No murmurs, ectopy, or gallups. . No lateral PMI. No thrills. Abdomen:  . Abdomen is soft, non-tender, non-distended . No hernias, masses, or organomegaly . Normoactive bowel sounds.  Musculoskeletal:  . No cyanosis, clubbing, or edema Skin:  . No rashes, lesions, ulcers . palpation of skin: no induration or nodules  Discharge Instructions  Discharge  Instructions    Activity as tolerated - No restrictions   Complete by: As directed    Ambulatory referral to Neurology   Complete by: As directed    Follow up with stroke clinic NP (Jessica Vanschaick or Cecille Rubin, if both not available, consider Zachery Dauer, or A[facility]) at [facility] in about 4 weeks. Thanks.   Call MD for:  persistant nausea and vomiting   Complete by: As directed    Call MD for:  severe uncontrolled pain   Complete by: As directed    Diet - low sodium heart healthy   Complete by: As directed    Discharge instructions   Complete by: As directed    Discharge to SNF Follow up with PCP in 7-10 days after discharge from SNF Follow up with Stroke Clinic at [facility] in 4 weeks. ASA and Plavix for 3 weeks, then Plavix only.   Increase activity slowly   Complete by: As directed      Allergies as  of 04/11/2020      Reactions   Felodipine Anaphylaxis, Other (See Comments)   Reaction: unknown possible nausea or rash   Prednisone Other (See Comments)   Unknown reaction   Shrimp [shellfish Allergy] Nausea And Vomiting   Codeine    Crestor [rosuvastatin]    Feldene [piroxicam]    Lescol [fluvastatin]    Lipitor [atorvastatin]    Nexium [esomeprazole]    Pravachol [pravastatin]    Prilosec Otc [omeprazole Magnesium]    Statins Other (See Comments)   Muscle/leg pain.   Welchol [colesevelam]    Zetia [ezetimibe]    Zithromax [azithromycin]    Zocor [simvastatin]    Omeprazole Other (See Comments)   Reaction: unknown possible nausea or rash      Medication List    STOP taking these medications   Tylenol 8 Hour Arthritis Pain 650 MG CR tablet Generic drug: acetaminophen     TAKE these medications   aspirin 81 MG EC tablet Take 1 tablet (81 mg total) by mouth daily for 20 days. Swallow whole. What changed: additional instructions   clopidogrel 75 MG tablet Commonly known as: PLAVIX Take 1 tablet (75 mg total) by mouth daily.   FISH  OIL PO Take 1 capsule by mouth daily.   labetalol 100 MG tablet Commonly known as: NORMODYNE Take 1 tablet (100 mg total) by mouth 2 (two) times daily.   lisinopril 20 MG tablet Commonly known as: ZESTRIL Take 1 tablet (20 mg total) by mouth daily. Start taking on: April 12, 2020 What changed:   medication strength  how much to take   PRESERVISION AREDS PO Take 1 capsule by mouth daily.   traZODone 50 MG tablet Commonly known as: DESYREL Take 25 mg by mouth at bedtime as needed for sleep.      Allergies  Allergen Reactions  . Felodipine Anaphylaxis and Other (See Comments)    Reaction: unknown possible nausea or rash  . Prednisone Other (See Comments)    Unknown reaction  . Shrimp [Shellfish Allergy] Nausea And Vomiting  . Codeine   . Crestor [Rosuvastatin]   . Feldene [Piroxicam]   . Lescol [Fluvastatin]   . Lipitor [Atorvastatin]   . Nexium [Esomeprazole]   . Pravachol [Pravastatin]   . Prilosec Otc [Omeprazole Magnesium]   . Statins Other (See Comments)    Muscle/leg pain.  Earnestine Mealing [Colesevelam]   . Zetia [Ezetimibe]   . Zithromax [Azithromycin]   . Zocor [Simvastatin]   . Omeprazole Other (See Comments)    Reaction: unknown possible nausea or rash    The results of significant diagnostics from this hospitalization (including imaging, microbiology, ancillary and laboratory) are listed below for reference.    Significant Diagnostic Studies: CT ANGIO HEAD W OR WO CONTRAST  Result Date: 04/08/2020 CLINICAL DATA:  Follow-up examination for acute stroke. EXAM: CT ANGIOGRAPHY HEAD AND NECK TECHNIQUE: Multidetector CT imaging of the head and neck was performed using the standard protocol during bolus administration of intravenous contrast. Multiplanar CT image reconstructions and MIPs were obtained to evaluate the vascular anatomy. Carotid stenosis measurements (when applicable) are obtained utilizing NASCET criteria, using the distal internal carotid diameter  as the denominator. CONTRAST:  80mL OMNIPAQUE IOHEXOL 350 MG/ML SOLN COMPARISON:  Prior studies from 04/07/2020 FINDINGS: CTA NECK FINDINGS Aortic arch: Visualized aortic arch of normal caliber with normal 3 vessel morphology. Mild atheromatous change about the arch and origin of the great vessels without hemodynamically significant stenosis. Right carotid system: Right CCA widely patent  from its origin to the bifurcation without stenosis. Mild atheromatous change about the right bifurcation without significant stenosis. Right ICA mildly tortuous and medialized into the retropharyngeal space but is widely patent to the skull base without stenosis, dissection or occlusion. Left carotid system: Left CCA patent from its origin to the bifurcation without flow-limiting stenosis. Mild atheromatous change about the origin of the left ICA without significant stenosis. Left ICA mildly tortuous and medialized into the retropharyngeal space but is widely patent to the skull base without stenosis, dissection or occlusion. Vertebral arteries: Both vertebral arteries arise from the subclavian arteries. Right vertebral artery dominant. Vertebral arteries widely patent without stenosis, dissection or occlusion. Skeleton: No definite acute osseous abnormality. Bones are somewhat diffusely sclerotic in appearance without discrete lytic or blastic osseous lesion Other neck: No other acute soft tissue abnormality within the neck. Few scattered thyroid nodules noted, largest of which measures 7 mm on the right. Findings felt to be of doubtful significance given size and patient age. No follow-up imaging recommended regarding these lesions. No other mass lesion or adenopathy. Upper chest: Mild scattered interlobular septal thickening noted within the visualized lungs, suggesting underlying mild pulmonary interstitial congestion. Mild irregular biapical pleuroparenchymal thickening/scarring noted. Visualized upper chest demonstrates no  other acute finding. Review of the MIP images confirms the above findings CTA HEAD FINDINGS Anterior circulation: Petrous segments patent bilaterally. Mild scattered atheromatous change within the cavernous/supraclinoid ICAs without hemodynamically significant stenosis. ICA termini well perfused. A1 segments patent bilaterally. Normal anterior communicating artery complex. Anterior cerebral arteries patent to their distal aspects without stenosis. No M1 stenosis or occlusion. Normal MCA bifurcations. On the left, there is a proximal left M3 occlusion, inferior division, in keeping with the previously identified small left MCA territory infarct (series 7, image 95). Otherwise, MCA branches well perfused and symmetric. Posterior circulation: Mild focal non stenotic plaque noted within the proximal right V4 segment. Vertebral arteries otherwise widely patent without stenosis. Both picas patent. Basilar patent to its distal aspect without stenosis. Superior cerebral arteries patent bilaterally. Left PCA supplied via the basilar. Fetal type origin of the right PCA. Both PCAs well perfused to their distal aspects without stenosis. Venous sinuses: Grossly patent allowing for timing the contrast bolus. Anatomic variants: Fetal type origin of the right PCA. No intracranial aneurysm. Review of the MIP images confirms the above findings IMPRESSION: 1. Acute proximal left M3 occlusion, inferior division, in keeping with the previously identified small left MCA territory infarct. 2. Mild atheromatous change about the carotid bifurcations and carotid siphons without hemodynamically significant or correctable stenosis. 3. Mild diffuse interlobular septal thickening within the visualized lungs, suggesting mild pulmonary interstitial congestion/edema. Electronically Signed   By: Jeannine Boga M.D.   On: 04/08/2020 01:57   DG Chest 2 View  Result Date: 04/08/2020 CLINICAL DATA:  86 year old female with slurred speech.  EXAM: CHEST - 2 VIEW COMPARISON:  Chest radiograph dated 03/09/2019. FINDINGS: There is cardiomegaly with mild vascular congestion. Left lung base density may represent atelectasis or infiltrate. The right lung is clear. No pleural effusion or pneumothorax. There is a moderate size hiatal hernia. No acute osseous pathology. IMPRESSION: 1. Cardiomegaly with mild vascular congestion. 2. Left lung base atelectasis versus infiltrate. 3. Moderate size hiatal hernia. Electronically Signed   By: Anner Crete M.D.   On: 04/08/2020 01:10   CT HEAD WO CONTRAST  Result Date: 04/07/2020 CLINICAL DATA:  Acute neuro deficit.  Slurred speech EXAM: CT HEAD WITHOUT CONTRAST TECHNIQUE: Contiguous axial images were  obtained from the base of the skull through the vertex without intravenous contrast. COMPARISON:  CT head 01/02/2020 FINDINGS: Brain: Mild cortical atrophy. Negative for hydrocephalus. Chronic infarct left thalamus unchanged. Mild chronic ischemic changes in the white matter. Negative for acute infarct, hemorrhage, mass Vascular: Negative for hyperdense vessel Skull: Negative Sinuses/Orbits: Paranasal sinuses clear. Bilateral cataract extraction. Other: None IMPRESSION: No acute abnormality no change from the prior study. Electronically Signed   By: Franchot Gallo M.D.   On: 04/07/2020 20:30   CT ANGIO NECK W OR WO CONTRAST  Result Date: 04/08/2020 CLINICAL DATA:  Follow-up examination for acute stroke. EXAM: CT ANGIOGRAPHY HEAD AND NECK TECHNIQUE: Multidetector CT imaging of the head and neck was performed using the standard protocol during bolus administration of intravenous contrast. Multiplanar CT image reconstructions and MIPs were obtained to evaluate the vascular anatomy. Carotid stenosis measurements (when applicable) are obtained utilizing NASCET criteria, using the distal internal carotid diameter as the denominator. CONTRAST:  68mL OMNIPAQUE IOHEXOL 350 MG/ML SOLN COMPARISON:  Prior studies from  04/07/2020 FINDINGS: CTA NECK FINDINGS Aortic arch: Visualized aortic arch of normal caliber with normal 3 vessel morphology. Mild atheromatous change about the arch and origin of the great vessels without hemodynamically significant stenosis. Right carotid system: Right CCA widely patent from its origin to the bifurcation without stenosis. Mild atheromatous change about the right bifurcation without significant stenosis. Right ICA mildly tortuous and medialized into the retropharyngeal space but is widely patent to the skull base without stenosis, dissection or occlusion. Left carotid system: Left CCA patent from its origin to the bifurcation without flow-limiting stenosis. Mild atheromatous change about the origin of the left ICA without significant stenosis. Left ICA mildly tortuous and medialized into the retropharyngeal space but is widely patent to the skull base without stenosis, dissection or occlusion. Vertebral arteries: Both vertebral arteries arise from the subclavian arteries. Right vertebral artery dominant. Vertebral arteries widely patent without stenosis, dissection or occlusion. Skeleton: No definite acute osseous abnormality. Bones are somewhat diffusely sclerotic in appearance without discrete lytic or blastic osseous lesion Other neck: No other acute soft tissue abnormality within the neck. Few scattered thyroid nodules noted, largest of which measures 7 mm on the right. Findings felt to be of doubtful significance given size and patient age. No follow-up imaging recommended regarding these lesions. No other mass lesion or adenopathy. Upper chest: Mild scattered interlobular septal thickening noted within the visualized lungs, suggesting underlying mild pulmonary interstitial congestion. Mild irregular biapical pleuroparenchymal thickening/scarring noted. Visualized upper chest demonstrates no other acute finding. Review of the MIP images confirms the above findings CTA HEAD FINDINGS Anterior  circulation: Petrous segments patent bilaterally. Mild scattered atheromatous change within the cavernous/supraclinoid ICAs without hemodynamically significant stenosis. ICA termini well perfused. A1 segments patent bilaterally. Normal anterior communicating artery complex. Anterior cerebral arteries patent to their distal aspects without stenosis. No M1 stenosis or occlusion. Normal MCA bifurcations. On the left, there is a proximal left M3 occlusion, inferior division, in keeping with the previously identified small left MCA territory infarct (series 7, image 95). Otherwise, MCA branches well perfused and symmetric. Posterior circulation: Mild focal non stenotic plaque noted within the proximal right V4 segment. Vertebral arteries otherwise widely patent without stenosis. Both picas patent. Basilar patent to its distal aspect without stenosis. Superior cerebral arteries patent bilaterally. Left PCA supplied via the basilar. Fetal type origin of the right PCA. Both PCAs well perfused to their distal aspects without stenosis. Venous sinuses: Grossly patent allowing for timing  the contrast bolus. Anatomic variants: Fetal type origin of the right PCA. No intracranial aneurysm. Review of the MIP images confirms the above findings IMPRESSION: 1. Acute proximal left M3 occlusion, inferior division, in keeping with the previously identified small left MCA territory infarct. 2. Mild atheromatous change about the carotid bifurcations and carotid siphons without hemodynamically significant or correctable stenosis. 3. Mild diffuse interlobular septal thickening within the visualized lungs, suggesting mild pulmonary interstitial congestion/edema. Electronically Signed   By: Jeannine Boga M.D.   On: 04/08/2020 01:57   MR BRAIN WO CONTRAST  Result Date: 04/07/2020 CLINICAL DATA:  Initial evaluation for neuro deficit, stroke suspected. EXAM: MRI HEAD WITHOUT CONTRAST TECHNIQUE: Multiplanar, multiecho pulse sequences of  the brain and surrounding structures were obtained without intravenous contrast. COMPARISON:  Prior CT from earlier the same day. FINDINGS: Brain: Generalized age-related cerebral atrophy. Minimal T2/FLAIR hyperintensity within the periventricular deep white matter both cerebral hemispheres most consistent with chronic small vessel ischemic disease, minor in appearance, and felt to be within normal limits for age. Small remote lacunar infarct noted at the left thalamus. 6 mm focus of diffusion abnormality involving the cortical gray matter of the inferior left frontal lobe suspicious for a small acute ischemic infarct (series 5, image 76). No associated hemorrhage or mass effect. No other diffusion abnormality to suggest acute or subacute ischemia. Gray-white matter differentiation otherwise maintained. No evidence for acute or chronic intracranial hemorrhage. No mass lesion, midline shift or mass effect. No hydrocephalus or extra-axial fluid collection. Pituitary gland suprasellar region normal. Midline structures intact. Vascular: Major intracranial vascular flow voids are maintained. Skull and upper cervical spine: Craniocervical junction within normal limits. Bone marrow signal intensity normal. No scalp soft tissue abnormality. Sinuses/Orbits: Patient status post bilateral ocular lens replacement. Paranasal sinuses are largely clear. No significant mastoid effusion. Inner ear structures grossly normal. Other: None. IMPRESSION: 1. 6 mm acute ischemic nonhemorrhagic cortical infarct involving the inferior left frontal lobe. 2. No other acute intracranial abnormality. 3. Underlying age-related cerebral atrophy, with remote left thalamic lacunar infarct. Electronically Signed   By: Jeannine Boga M.D.   On: 04/07/2020 23:30   ECHOCARDIOGRAM COMPLETE  Result Date: 04/08/2020    ECHOCARDIOGRAM REPORT   Patient Name:   [name] Date of Exam: 04/08/2020 Medical Rec #:  [id number]      Height:       63.0  in Accession #:    [id number]     Weight:       141.3 lb Date of Birth:  September 07, 1928      BSA:          1.668 m Patient Age:    45 years       BP:           163/65 mmHg Patient Gender: F              HR:           56 bpm. Exam Location:  Inpatient Procedure: 2D Echo, 3D Echo and Strain Analysis Indications:    Stroke 434.91 / I163.9  History:        Patient has prior history of Echocardiogram examinations, most                 recent 03/10/2019. CAD; Risk Factors:Hypertension and                 Dyslipidemia.  Sonographer:    Darlina Sicilian RDCS Referring Phys: [id number] [place]  1.  Left ventricular ejection fraction, by estimation, is 60 to 65%. The left ventricle has normal function. The left ventricle has no regional wall motion abnormalities. There is mild concentric left ventricular hypertrophy. Left ventricular diastolic parameters are consistent with Grade I diastolic dysfunction (impaired relaxation). Elevated left ventricular end-diastolic pressure. The average left ventricular global longitudinal strain is -18.7 %. The global longitudinal strain is normal.  2. Right ventricular systolic function is normal. The right ventricular size is normal. There is normal pulmonary artery systolic pressure.  3. Left atrial size was severely dilated.  4. The mitral valve is normal in structure. Trivial mitral valve regurgitation. No evidence of mitral stenosis.  5. Tricuspid valve regurgitation is mild to moderate.  6. The aortic valve is tricuspid. Aortic valve regurgitation is not visualized. No aortic stenosis is present.  7. The inferior vena cava is normal in size with <50% respiratory variability, suggesting right atrial pressure of 8 mmHg. FINDINGS  Left Ventricle: Left ventricular ejection fraction, by estimation, is 60 to 65%. The left ventricle has normal function. The left ventricle has no regional wall motion abnormalities. The average left ventricular global longitudinal strain is -18.7  %. The global longitudinal strain is normal. The left ventricular internal cavity size was normal in size. There is mild concentric left ventricular hypertrophy. Left ventricular diastolic parameters are consistent with Grade I diastolic dysfunction (impaired relaxation). Elevated left ventricular end-diastolic pressure. Right Ventricle: The right ventricular size is normal. No increase in right ventricular wall thickness. Right ventricular systolic function is normal. There is normal pulmonary artery systolic pressure. The tricuspid regurgitant velocity is 2.50 m/s, and  with an assumed right atrial pressure of 8 mmHg, the estimated right ventricular systolic pressure is 02.7 mmHg. Left Atrium: Left atrial size was severely dilated. Right Atrium: Right atrial size was normal in size. Pericardium: There is no evidence of pericardial effusion. Mitral Valve: The mitral valve is normal in structure. Normal mobility of the mitral valve leaflets. Moderate mitral annular calcification. Trivial mitral valve regurgitation. No evidence of mitral valve stenosis. Tricuspid Valve: The tricuspid valve is normal in structure. Tricuspid valve regurgitation is mild to moderate. No evidence of tricuspid stenosis. Aortic Valve: The aortic valve is tricuspid. . There is moderate thickening and moderate calcification of the aortic valve. Aortic valve regurgitation is not visualized. No aortic stenosis is present. There is moderate thickening of the aortic valve. There is moderate calcification of the aortic valve. Pulmonic Valve: The pulmonic valve was normal in structure. Pulmonic valve regurgitation is mild. No evidence of pulmonic stenosis. Aorta: The aortic root is normal in size and structure. Venous: The inferior vena cava is normal in size with less than 50% respiratory variability, suggesting right atrial pressure of 8 mmHg. IAS/Shunts: No atrial level shunt detected by color flow Doppler.  LEFT VENTRICLE PLAX 2D LVIDd:          3.30 cm  Diastology LVIDs:         1.90 cm  LV e' lateral:   4.68 cm/s LV PW:         1.20 cm  LV E/e' lateral: 18.1 LV IVS:        1.10 cm  LV e' medial:    5.66 cm/s LVOT diam:     1.80 cm  LV E/e' medial:  14.9 LV SV:         54 LV SV Index:   32       2D Longitudinal Strain LVOT Area:  2.54 cm 2D Strain GLS (A2C):   -18.1 %                         2D Strain GLS (A3C):   -19.2 %                         2D Strain GLS (A4C):   -18.8 %                         2D Strain GLS Avg:     -18.7 %                          3D Volume EF:                         3D EF:        62 %                         LV EDV:       101 ml                         LV ESV:       38 ml                         LV SV:        63 ml RIGHT VENTRICLE TAPSE (M-mode): 2.8 cm LEFT ATRIUM             Index       RIGHT ATRIUM           Index LA diam:        3.60 cm 2.16 cm/m  RA Area:     18.50 cm LA Vol (A2C):   65.8 ml 39.44 ml/m RA Volume:   47.50 ml  28.47 ml/m LA Vol (A4C):   66.1 ml 39.62 ml/m LA Biplane Vol: 66.9 ml 40.10 ml/m  AORTIC VALVE LVOT Vmax:   117.00 cm/s LVOT Vmean:  78.300 cm/s LVOT VTI:    0.211 m  AORTA Ao Root diam: 2.80 cm Ao Asc diam:  3.20 cm MITRAL VALVE                TRICUSPID VALVE MV Area (PHT): 2.60 cm     TR Peak grad:   25.0 mmHg MV Decel Time: 292 msec     TR Vmax:        250.00 cm/s MV E velocity: 84.50 cm/s MV A velocity: 128.00 cm/s  SHUNTS MV E/A ratio:  0.66         Systemic VTI:  0.21 m                             Systemic Diam: 1.80 cm Skeet Latch MD Electronically signed by Skeet Latch MD Signature Date/Time: 04/08/2020/4:41:14 PM    Final    VAS [place] LOWER EXTREMITY VENOUS (DVT)  Result Date: 04/08/2020  Lower Venous DVTStudy Indications: Stroke.  Limitations: Pain. Performing Technologist: Antonieta Pert RDMS, RVT  Examination Guidelines: A complete evaluation includes B-mode imaging, spectral Doppler, color Doppler, and power Doppler as needed of all accessible portions of each vessel.  Bilateral testing is considered an integral part of  a complete examination. Limited examinations for reoccurring indications may be performed as noted. The reflux portion of the exam is performed with the patient in reverse Trendelenburg.  +---------+---------------+---------+-----------+----------+--------------+ RIGHT    CompressibilityPhasicitySpontaneityPropertiesThrombus Aging +---------+---------------+---------+-----------+----------+--------------+ CFV      Full           Yes      Yes                                 +---------+---------------+---------+-----------+----------+--------------+ SFJ      Full                                                        +---------+---------------+---------+-----------+----------+--------------+ FV Prox  Full                                                        +---------+---------------+---------+-----------+----------+--------------+ FV Mid   Full                                                        +---------+---------------+---------+-----------+----------+--------------+ FV DistalFull                                                        +---------+---------------+---------+-----------+----------+--------------+ PFV      Full                                                        +---------+---------------+---------+-----------+----------+--------------+ POP      Full           Yes      Yes                                 +---------+---------------+---------+-----------+----------+--------------+ PTV      Full                                                        +---------+---------------+---------+-----------+----------+--------------+ PERO     Full                                                        +---------+---------------+---------+-----------+----------+--------------+ GSV      Full                                                         +---------+---------------+---------+-----------+----------+--------------+  Hypoechoic fliud collection seen mid thigh aprox 6.4 x 3.1 x 1.7cm  +---------+---------------+---------+-----------+----------+--------------+ LEFT     CompressibilityPhasicitySpontaneityPropertiesThrombus Aging +---------+---------------+---------+-----------+----------+--------------+ CFV      Full           Yes      Yes                                 +---------+---------------+---------+-----------+----------+--------------+ SFJ      Full                                                        +---------+---------------+---------+-----------+----------+--------------+ FV Prox  Full                                                        +---------+---------------+---------+-----------+----------+--------------+ FV Mid   Full                                                        +---------+---------------+---------+-----------+----------+--------------+ FV DistalFull                                                        +---------+---------------+---------+-----------+----------+--------------+ PFV      Full                                                        +---------+---------------+---------+-----------+----------+--------------+ POP      Full           Yes      Yes                                 +---------+---------------+---------+-----------+----------+--------------+ PTV      Full                                                        +---------+---------------+---------+-----------+----------+--------------+ PERO     Full                                                        +---------+---------------+---------+-----------+----------+--------------+ GSV      Full                                                        +---------+---------------+---------+-----------+----------+--------------+  Summary: RIGHT: - There is no evidence of deep vein  thrombosis in the lower extremity.  - A cystic structure is found in the popliteal fossa. - In addition to Bakers cyst, patient has a large hypoechoic fluid collection mid right thigh.  LEFT: - There is no evidence of deep vein thrombosis in the lower extremity.  - A cystic structure is found in the popliteal fossa.  *See table(s) above for measurements and observations. Electronically signed by Harold Barban MD on 04/08/2020 at 5:37:50 PM.    Final     Microbiology: Recent Results (from the past 240 hour(s))  SARS Coronavirus 2 by RT PCR (hospital order, performed in [facility] hospital lab) Nasopharyngeal Nasopharyngeal Swab     Status: None   Collection Time: 04/07/20  8:09 PM   Specimen: Nasopharyngeal Swab  Result Value Ref Range Status   SARS Coronavirus 2 NEGATIVE NEGATIVE Final    Comment: (NOTE) SARS-CoV-2 target nucleic acids are NOT DETECTED.  The SARS-CoV-2 RNA is generally detectable in upper and lower respiratory specimens during the acute phase of infection. The lowest concentration of SARS-CoV-2 viral copies this assay can detect is 250 copies / mL. A negative result does not preclude SARS-CoV-2 infection and should not be used as the sole basis for treatment or other patient management decisions.  A negative result may occur with improper specimen collection / handling, submission of specimen other than nasopharyngeal swab, presence of viral mutation(s) within the areas targeted by this assay, and inadequate number of viral copies (<250 copies / mL). A negative result must be combined with clinical observations, patient history, and epidemiological information.  Fact Sheet for Patients:   StrictlyIdeas.no  Fact Sheet for Healthcare Providers: BankingDealers.co.za  This test is not yet approved or  cleared by the [place] FDA and has been authorized for detection and/or diagnosis of SARS-CoV-2 by FDA under an  Emergency Use Authorization (EUA).  This EUA will remain in effect (meaning this test can be used) for the duration of the COVID-19 declaration under Section 564(b)(1) of the Act, 21 U.S.C. section 360bbb-3(b)(1), unless the authorization is terminated or revoked sooner.  Performed at [facility], [place] [address] [place], [place] [address]   Culture, Urine     Status: Abnormal   Collection Time: 04/08/20  6:30 PM   Specimen: Urine, Random  Result Value Ref Range Status   Specimen Description URINE, RANDOM  Final   Special Requests NONE  Final   Culture (A)  Final    <10,000 COLONIES/mL INSIGNIFICANT GROWTH Performed at [facility], [place] [address], [place], [place] [address]    Report Status 04/10/2020 FINAL  Final  SARS Coronavirus 2 by RT PCR (hospital order, performed in [facility] hospital lab) Nasopharyngeal Nasopharyngeal Swab     Status: None   Collection Time: 04/10/20  1:55 PM   Specimen: Nasopharyngeal Swab  Result Value Ref Range Status   SARS Coronavirus 2 NEGATIVE NEGATIVE Final    Comment: (NOTE) SARS-CoV-2 target nucleic acids are NOT DETECTED.  The SARS-CoV-2 RNA is generally detectable in upper and lower respiratory specimens during the acute phase of infection. The lowest concentration of SARS-CoV-2 viral copies this assay can detect is 250 copies / mL. A negative result does not preclude SARS-CoV-2 infection and should not be used as the sole basis for treatment or other patient management decisions.  A negative result may occur with improper specimen collection / handling, submission of specimen other than nasopharyngeal  swab, presence of viral mutation(s) within the areas targeted by this assay, and inadequate number of viral copies (<250 copies / mL). A negative result must be combined with clinical observations, patient history, and epidemiological information.  Fact Sheet for Patients:   StrictlyIdeas.no  Fact  Sheet for Healthcare Providers: BankingDealers.co.za  This test is not yet approved or  cleared by the [place] FDA and has been authorized for detection and/or diagnosis of SARS-CoV-2 by FDA under an Emergency Use Authorization (EUA).  This EUA will remain in effect (meaning this test can be used) for the duration of the COVID-19 declaration under Section 564(b)(1) of the Act, 21 U.S.C. section 360bbb-3(b)(1), unless the authorization is terminated or revoked sooner.  Performed at [facility], [place] [address] [place], [place] [address]      Labs: Basic Metabolic Panel: Recent Labs  Lab 04/07/20 2009 04/07/20 2009 04/07/20 2026 04/07/20 2205 04/08/20 0741 04/09/20 0149 04/10/20 0357  NA 137  --  135  --  139 136 137  K >7.5*   < > 7.6* 4.2 3.9 4.7 3.8  CL 103  --  105  --  104 102 106  CO2 24  --   --   --  25 23 22   GLUCOSE 92  --  90  --  136* 106* 111*  BUN 30*  --  51*  --  22 23 21   CREATININE 0.94  --  0.90  --  0.83 0.89 0.79  CALCIUM 9.5  --   --   --  9.5 9.1 9.3  MG  --   --   --   --   --   --  1.4*   < > = values in this interval not displayed.   Liver Function Tests: Recent Labs  Lab 04/07/20 2009 04/08/20 0741  AST 60* 25  ALT 13 20  ALKPHOS 59 55  BILITOT 2.2* 0.8  PROT 5.5* 5.4*  ALBUMIN 3.5 3.3*   No results for input(s): LIPASE, AMYLASE in the last 168 hours. No results for input(s): AMMONIA in the last 168 hours. CBC: Recent Labs  Lab 04/07/20 2009 04/07/20 2009 04/07/20 2026 04/08/20 0741 04/09/20 0149 04/10/20 0357 04/11/20 0334  WBC 6.7  --   --  8.9 7.3 6.4 7.5  NEUTROABS 4.1  --   --   --  4.6 3.8 4.9  HGB 14.1   < > 14.6 13.9 13.5 13.5 13.9  HCT 43.4   < > 43.0 41.6 40.3 41.4 42.3  MCV 96.4  --   --  95.2 95.0 95.2 95.3  PLT 126*  --   --  194 162 187 197   < > = values in this interval not displayed.   Cardiac Enzymes: No results for input(s): CKTOTAL, CKMB, CKMBINDEX, TROPONINI in the  last 168 hours. BNP: BNP (last 3 results) No results for input(s): BNP in the last 8760 hours.  ProBNP (last 3 results) No results for input(s): PROBNP in the last 8760 hours.  CBG: Recent Labs  Lab 04/08/20 0619 04/09/20 2244  GLUCAP 136* 108*    Principal Problem:   Acute CVA (cerebrovascular accident) ([place]) Active Problems:   Hypertension   CAD (coronary artery disease)   Upper back pain   Thrombocytopenia (HCC)   Dyslipidemia   Stage 3a chronic kidney disease   Demand ischemia ([place])   Time coordinating discharge: 38 minutes.  Signed:        Annell Canty, DO [facility]  04/11/2020, 10:46 AM

## 2020-04-14 DIAGNOSIS — E785 Hyperlipidemia, unspecified: Secondary | ICD-10-CM | POA: Diagnosis not present

## 2020-04-14 DIAGNOSIS — I10 Essential (primary) hypertension: Secondary | ICD-10-CM | POA: Diagnosis not present

## 2020-04-14 DIAGNOSIS — R69 Illness, unspecified: Secondary | ICD-10-CM | POA: Diagnosis not present

## 2020-04-14 DIAGNOSIS — M6281 Muscle weakness (generalized): Secondary | ICD-10-CM | POA: Diagnosis not present

## 2020-04-14 DIAGNOSIS — I2510 Atherosclerotic heart disease of native coronary artery without angina pectoris: Secondary | ICD-10-CM | POA: Diagnosis not present

## 2020-04-14 DIAGNOSIS — I639 Cerebral infarction, unspecified: Secondary | ICD-10-CM | POA: Diagnosis not present

## 2020-04-14 DIAGNOSIS — M1909 Primary osteoarthritis, other specified site: Secondary | ICD-10-CM | POA: Diagnosis not present

## 2020-04-16 ENCOUNTER — Emergency Department (HOSPITAL_COMMUNITY): Payer: Medicare HMO

## 2020-04-16 ENCOUNTER — Other Ambulatory Visit: Payer: Self-pay

## 2020-04-16 ENCOUNTER — Observation Stay (HOSPITAL_COMMUNITY)
Admission: EM | Admit: 2020-04-16 | Discharge: 2020-04-17 | Disposition: A | Discharge status: Home / Self Care | Payer: Medicare HMO | Attending: Family Medicine | Admitting: Family Medicine

## 2020-04-16 DIAGNOSIS — I6930 Unspecified sequelae of cerebral infarction: Principal | ICD-10-CM | POA: Insufficient documentation

## 2020-04-16 DIAGNOSIS — R4701 Aphasia: Secondary | ICD-10-CM | POA: Insufficient documentation

## 2020-04-16 DIAGNOSIS — Z20822 Contact with and (suspected) exposure to covid-19: Secondary | ICD-10-CM | POA: Insufficient documentation

## 2020-04-16 DIAGNOSIS — I6782 Cerebral ischemia: Secondary | ICD-10-CM | POA: Diagnosis not present

## 2020-04-16 DIAGNOSIS — I2510 Atherosclerotic heart disease of native coronary artery without angina pectoris: Secondary | ICD-10-CM | POA: Diagnosis not present

## 2020-04-16 DIAGNOSIS — I10 Essential (primary) hypertension: Secondary | ICD-10-CM | POA: Diagnosis present

## 2020-04-16 DIAGNOSIS — E785 Hyperlipidemia, unspecified: Secondary | ICD-10-CM | POA: Insufficient documentation

## 2020-04-16 DIAGNOSIS — G9389 Other specified disorders of brain: Secondary | ICD-10-CM | POA: Diagnosis not present

## 2020-04-16 DIAGNOSIS — Z79899 Other long term (current) drug therapy: Secondary | ICD-10-CM | POA: Diagnosis not present

## 2020-04-16 DIAGNOSIS — N1831 Chronic kidney disease, stage 3a: Secondary | ICD-10-CM | POA: Diagnosis not present

## 2020-04-16 DIAGNOSIS — I7090 Unspecified atherosclerosis: Secondary | ICD-10-CM | POA: Diagnosis not present

## 2020-04-16 DIAGNOSIS — R42 Dizziness and giddiness: Secondary | ICD-10-CM | POA: Diagnosis not present

## 2020-04-16 DIAGNOSIS — M6281 Muscle weakness (generalized): Secondary | ICD-10-CM | POA: Diagnosis not present

## 2020-04-16 DIAGNOSIS — I129 Hypertensive chronic kidney disease with stage 1 through stage 4 chronic kidney disease, or unspecified chronic kidney disease: Secondary | ICD-10-CM | POA: Insufficient documentation

## 2020-04-16 DIAGNOSIS — G4489 Other headache syndrome: Secondary | ICD-10-CM | POA: Diagnosis not present

## 2020-04-16 DIAGNOSIS — I6381 Other cerebral infarction due to occlusion or stenosis of small artery: Secondary | ICD-10-CM | POA: Diagnosis not present

## 2020-04-16 DIAGNOSIS — R52 Pain, unspecified: Secondary | ICD-10-CM | POA: Diagnosis not present

## 2020-04-16 DIAGNOSIS — I639 Cerebral infarction, unspecified: Secondary | ICD-10-CM | POA: Diagnosis present

## 2020-04-16 DIAGNOSIS — I440 Atrioventricular block, first degree: Secondary | ICD-10-CM | POA: Diagnosis not present

## 2020-04-16 DIAGNOSIS — R29810 Facial weakness: Secondary | ICD-10-CM | POA: Diagnosis not present

## 2020-04-16 DIAGNOSIS — Z7982 Long term (current) use of aspirin: Secondary | ICD-10-CM | POA: Insufficient documentation

## 2020-04-16 DIAGNOSIS — R4781 Slurred speech: Secondary | ICD-10-CM | POA: Diagnosis not present

## 2020-04-16 DIAGNOSIS — R519 Headache, unspecified: Secondary | ICD-10-CM | POA: Diagnosis not present

## 2020-04-16 LAB — BASIC METABOLIC PANEL
Anion gap: 9 (ref 5–15)
BUN: 27 mg/dL — ABNORMAL HIGH (ref 8–23)
CO2: 27 mmol/L (ref 22–32)
Calcium: 9.4 mg/dL (ref 8.9–10.3)
Chloride: 103 mmol/L (ref 98–111)
Creatinine, Ser: 0.83 mg/dL (ref 0.44–1.00)
GFR calc Af Amer: >60 mL/min (ref >60)
GFR calc non Af Amer: >60 mL/min (ref >60)
Glucose, Bld: 105 mg/dL — ABNORMAL HIGH (ref 70–99)
Potassium: 4.6 mmol/L (ref 3.5–5.1)
Sodium: 139 mmol/L (ref 135–145)

## 2020-04-16 LAB — CBC WITH DIFFERENTIAL/PLATELET
Abs Immature Granulocytes: 0.06 10*3/uL (ref 0.00–0.07)
Basophils Absolute: 0 10*3/uL (ref 0.0–0.1)
Basophils Relative: 1 %
Eosinophils Absolute: 0.2 10*3/uL (ref 0.0–0.5)
Eosinophils Relative: 4 %
HCT: 43.2 % (ref 36.0–46.0)
Hemoglobin: 13.8 g/dL (ref 12.0–15.0)
Immature Granulocytes: 1 %
Lymphocytes Relative: 21 %
Lymphs Abs: 1.1 10*3/uL (ref 0.7–4.0)
MCH: 31.8 pg (ref 26.0–34.0)
MCHC: 31.9 g/dL (ref 30.0–36.0)
MCV: 99.5 fL (ref 80.0–100.0)
Monocytes Absolute: 0.8 10*3/uL (ref 0.1–1.0)
Monocytes Relative: 15 %
Neutro Abs: 3 10*3/uL (ref 1.7–7.7)
Neutrophils Relative %: 58 %
Platelets: 232 10*3/uL (ref 150–400)
RBC: 4.34 MIL/uL (ref 3.87–5.11)
RDW: 15.2 % (ref 11.5–15.5)
WBC: 5.1 10*3/uL (ref 4.0–10.5)
nRBC: 0 % (ref 0.0–0.2)

## 2020-04-16 MED ORDER — HYDRALAZINE HCL 20 MG/ML IJ SOLN
5.0000 mg | Freq: Once | INTRAMUSCULAR | Status: AC
  Administered 2020-04-16: 5 mg via INTRAVENOUS
  Filled 2020-04-16: qty 1

## 2020-04-16 MED ORDER — ACETAMINOPHEN 325 MG PO TABS: 650.0000 mg | ORAL_TABLET | ORAL | Status: DC | PRN

## 2020-04-16 MED ORDER — HYDRALAZINE HCL 20 MG/ML IJ SOLN
10.0000 mg | INTRAMUSCULAR | Status: DC | PRN
  Administered 2020-04-17 (×2): 10 mg via INTRAVENOUS
  Filled 2020-04-16: qty 1

## 2020-04-16 MED ORDER — STROKE: EARLY STAGES OF RECOVERY BOOK
Freq: Once | Status: AC
  Filled 2020-04-16: qty 1

## 2020-04-16 MED ORDER — FENTANYL CITRATE (PF) 100 MCG/2ML IJ SOLN
25.0000 ug | Freq: Once | INTRAMUSCULAR | Status: AC
  Administered 2020-04-16: 25 ug via INTRAVENOUS
  Filled 2020-04-16: qty 2

## 2020-04-16 MED ORDER — ACETAMINOPHEN 160 MG/5ML PO SOLN: 650.0000 mg | ORAL | Status: DC | PRN

## 2020-04-16 MED ORDER — TRAZODONE HCL 50 MG PO TABS: 25.0000 mg | ORAL_TABLET | Freq: Every evening | ORAL | Status: DC | PRN

## 2020-04-16 MED ORDER — LABETALOL HCL 100 MG PO TABS
100.0000 mg | ORAL_TABLET | Freq: Two times a day (BID) | ORAL | Status: DC
  Administered 2020-04-17: 100 mg via ORAL
  Filled 2020-04-16: qty 1

## 2020-04-16 MED ORDER — LISINOPRIL 20 MG PO TABS
20.0000 mg | ORAL_TABLET | Freq: Every day | ORAL | Status: DC
  Administered 2020-04-17: 20 mg via ORAL
  Filled 2020-04-16: qty 1

## 2020-04-16 MED ORDER — ASPIRIN EC 81 MG PO TBEC
81.0000 mg | DELAYED_RELEASE_TABLET | Freq: Every day | ORAL | Status: DC
  Administered 2020-04-17: 81 mg via ORAL
  Filled 2020-04-16: qty 1

## 2020-04-16 MED ORDER — LORAZEPAM 2 MG/ML IJ SOLN: 1.0000 mg | Freq: Once | INTRAMUSCULAR | Status: DC | PRN

## 2020-04-16 MED ORDER — ACETAMINOPHEN 650 MG RE SUPP: 650.0000 mg | RECTAL | Status: DC | PRN

## 2020-04-16 NOTE — ED Notes (Signed)
Pt returned from CT °

## 2020-04-16 NOTE — ED Triage Notes (Signed)
Patient arrived via [facility] from [facility], staff stated patient was c/o headache, dizziness, tingling and left arm pain. Per EMS patient was noted slow speech. Patient has a history of CVS speech beibng the only deficit. Patient last known normal was 04/15/20 at 3pm.  EMS VS 166/68,57,22,99% on RA. CBG-134. Patient is [facility]

## 2020-04-16 NOTE — ED Notes (Signed)
Neurology at the bedside

## 2020-04-16 NOTE — ED Notes (Signed)
[name], son, [phone] would like an update when available

## 2020-04-16 NOTE — ED Notes (Signed)
Attempted report x 2 

## 2020-04-16 NOTE — H&P (Signed)
History and Physical    [name] QJJ:[id number] DOB: 1929/03/18 DOA: 04/16/2020  PCP: Lajean Manes, MD  Patient coming from: Home  I have personally briefly reviewed patient's old medical records in [place]  Chief Complaint: Headache  HPI: [name] is a 86 y.o. female with medical history significant of HLD, HTN, dCHF.  Pt just admitted 8/23-8/27 for stroke with aphasia.  She was doing okay at SNF until she started complaining of dizziness and headache which prompted her being brought back to ED today.   ED Course: In the ED headache is improved with fentanyl.  BP is running 190/70s.  MRI brain shows significant extension of previously imaged stroke during last admission.   Review of Systems: Limited due to aphasia and hearing loss.  Past Medical History:  Diagnosis Date  . Arthritis   . CAD (coronary artery disease)    a. Mod prox LAD & first diagonal stenosis by cath 2002 - managed medically since that time with normal LV function.  . Cataract   . Hyperlipidemia   . Hypertension     Past Surgical History:  Procedure Laterality Date  . ABDOMINAL HYSTERECTOMY    . APPENDECTOMY    . BACK SURGERY    . CARPAL TUNNEL RELEASE    . CHOLECYSTECTOMY N/A 06/19/2013   Procedure: LAPAROSCOPIC CHOLECYSTECTOMY;  Surgeon: Harl Bowie, MD;  Location: [facility];  Service: General;  Laterality: N/A;  . ESOPHAGOGASTRODUODENOSCOPY N/A 04/26/2017   Procedure: ESOPHAGOGASTRODUODENOSCOPY (EGD) W/ DILATION;  Surgeon: Ronnette Juniper, MD;  Location: [facility];  Service: Gastroenterology;  Laterality: N/A;  . HERNIA REPAIR    . INSERTION OF MESH  05/19/2018   Procedure: INSERTION OF MESH;  Surgeon: Excell Seltzer, MD;  Location: [facility];  Service: General;;  . IR GENERIC HISTORICAL  03/18/2016   IR RADIOLOGIST EVAL & MGMT 03/18/2016 Corrie Mckusick, DO [facility] INTERV RAD  . KNEE SURGERY    . SHOULDER SURGERY    . SPLENECTOMY, TOTAL    . VENTRAL HERNIA REPAIR N/A 05/19/2018    Procedure: HERNIA REPAIR VENTRAL ADULT;  Surgeon: Excell Seltzer, MD;  Location: [facility];  Service: General;  Laterality: N/A;     reports that she has never smoked. She has never used smokeless tobacco. She reports that she does not drink alcohol and does not use drugs.  Allergies  Allergen Reactions  . Felodipine Anaphylaxis and Other (See Comments)    Reaction: unknown possible nausea or rash  . Prednisone Other (See Comments)    Unknown reaction  . Shrimp [Shellfish Allergy] Nausea And Vomiting  . Codeine   . Crestor [Rosuvastatin]   . Feldene [Piroxicam]   . Lescol [Fluvastatin]   . Lipitor [Atorvastatin]   . Nexium [Esomeprazole]   . Pravachol [Pravastatin]   . Prilosec Otc [Omeprazole Magnesium]   . Statins Other (See Comments)    Muscle/leg pain.  Earnestine Mealing [Colesevelam]   . Zetia [Ezetimibe]   . Zithromax [Azithromycin]   . Zocor [Simvastatin]   . Omeprazole Other (See Comments)    Reaction: unknown possible nausea or rash    Family History  Problem Relation Age of Onset  . Heart disease Mother        "enlarged valve"  . Cancer Mother        Ovarian  . Stroke Father   . Cancer Sister        Colon     Prior to Admission medications   Medication Sig Start Date  End Date Taking? Authorizing Provider  aspirin EC 81 MG EC tablet Take 1 tablet (81 mg total) by mouth daily for 20 days. Swallow whole. 04/11/20 05/01/20  Swayze, Ava, DO  labetalol (NORMODYNE) 100 MG tablet Take 1 tablet (100 mg total) by mouth 2 (two) times daily. 04/11/20   Swayze, Ava, DO  lisinopril (ZESTRIL) 20 MG tablet Take 1 tablet (20 mg total) by mouth daily. 04/12/20   Swayze, Ava, DO  Multiple Vitamins-Minerals (PRESERVISION AREDS PO) Take 1 capsule by mouth daily.     [provider]  Omega-3 Fatty Acids (FISH OIL PO) Take 1 capsule by mouth daily.     [provider]  traZODone (DESYREL) 50 MG tablet Take 25 mg by mouth at bedtime as needed for sleep.  09/28/18    [provider]    Physical Exam: Vitals:   04/16/20 1515 04/16/20 1530 04/16/20 1920 04/16/20 2110  BP: (!) 184/75 (!) 171/62 (!) 197/61 (!) 214/76  Pulse: (!) 53 (!) 50 (!) 56 (!) 56  Resp: 17 (!) 21 19 16   Temp:      TempSrc:      SpO2: 97% 97% 97% 95%  Weight:      Height:        Constitutional: NAD, calm, comfortable Eyes: PERRL, lids and conjunctivae normal ENMT: Mucous membranes are moist. Posterior pharynx clear of any exudate or lesions.Normal dentition.  Neck: normal, supple, no masses, no thyromegaly Respiratory: clear to auscultation bilaterally, no wheezing, no crackles. Normal respiratory effort. No accessory muscle use.  Cardiovascular: Regular rate and rhythm, no murmurs / rubs / gallops. No extremity edema. 2+ pedal pulses. No carotid bruits.  Abdomen: no tenderness, no masses palpated. No hepatosplenomegaly. Bowel sounds positive.  Musculoskeletal: no clubbing / cyanosis. No joint deformity upper and lower extremities. Good ROM, no contractures. Normal muscle tone.  Skin: no rashes, lesions, ulcers. No induration Neurologic: Limited due to [facility] and aphasia Psychiatric: Limited due to [facility] and aphasia   Labs on Admission: I have personally reviewed following labs and imaging studies  CBC: Recent Labs  Lab 04/10/20 0357 04/11/20 0334 04/16/20 1302  WBC 6.4 7.5 5.1  NEUTROABS 3.8 4.9 3.0  HGB 13.5 13.9 13.8  HCT 41.4 42.3 43.2  MCV 95.2 95.3 99.5  PLT 187 197 784   Basic Metabolic Panel: Recent Labs  Lab 04/10/20 0357 04/16/20 1302  NA 137 139  K 3.8 4.6  CL 106 103  CO2 22 27  GLUCOSE 111* 105*  BUN 21 27*  CREATININE 0.79 0.83  CALCIUM 9.3 9.4  MG 1.4*  --    GFR: Estimated Creatinine Clearance: 41.6 mL/min (by C-G formula based on SCr of 0.83 mg/dL). Liver Function Tests: No results for input(s): AST, ALT, ALKPHOS, BILITOT, PROT, ALBUMIN in the last 168 hours. No results for input(s): LIPASE, AMYLASE in the last 168 hours. No  results for input(s): AMMONIA in the last 168 hours. Coagulation Profile: No results for input(s): INR, PROTIME in the last 168 hours. Cardiac Enzymes: No results for input(s): CKTOTAL, CKMB, CKMBINDEX, TROPONINI in the last 168 hours. BNP (last 3 results) No results for input(s): PROBNP in the last 8760 hours. HbA1C: No results for input(s): HGBA1C in the last 72 hours. CBG: Recent Labs  Lab 04/09/20 2244  GLUCAP 108*   Lipid Profile: No results for input(s): CHOL, HDL, LDLCALC, TRIG, CHOLHDL, LDLDIRECT in the last 72 hours. Thyroid Function Tests: No results for input(s): TSH, T4TOTAL, FREET4, T3FREE, THYROIDAB in the  last 72 hours. Anemia Panel: No results for input(s): VITAMINB12, FOLATE, FERRITIN, TIBC, IRON, RETICCTPCT in the last 72 hours. Urine analysis:    Component Value Date/Time   COLORURINE YELLOW 04/08/2020 0420   APPEARANCEUR CLEAR 04/08/2020 0420   LABSPEC 1.026 04/08/2020 0420   PHURINE 7.0 04/08/2020 0420   GLUCOSEU NEGATIVE 04/08/2020 0420   HGBUR NEGATIVE 04/08/2020 0420   BILIRUBINUR NEGATIVE 04/08/2020 0420   KETONESUR 5 (A) 04/08/2020 0420   PROTEINUR NEGATIVE 04/08/2020 0420   UROBILINOGEN 0.2 08/02/2013 1342   NITRITE POSITIVE (A) 04/08/2020 0420   LEUKOCYTESUR LARGE (A) 04/08/2020 0420    Radiological Exams on Admission: CT Head Wo Contrast  Result Date: 04/16/2020 CLINICAL DATA:  Headache, dizziness EXAM: CT HEAD WITHOUT CONTRAST TECHNIQUE: Contiguous axial images were obtained from the base of the skull through the vertex without intravenous contrast. COMPARISON:  04/07/2020 FINDINGS: Brain: Low-density changes centered at the gray-white matter junction within the left frontotemporal lobe compatible with a acute to subacute infarct. Territory of involvement is significantly greater than was present on the previous MRI of 04/07/2020. There are a few tiny punctate areas of relative increased attenuation within the adjacent cortex which could reflect  petechial microhemorrhage (series 3, image 16). No large/space occupying hemorrhage. Chronic lacunar infarct within the left thalamus. No hydrocephalus. No extra-axial collection/hemorrhage. Vascular: Atherosclerotic calcifications involving the large vessels of the skull base. No unexpected hyperdense vessel. Skull: Normal. Negative for fracture or focal lesion. Sinuses/Orbits: No acute finding. Other: None. IMPRESSION: Acute to subacute infarct within the left frontotemporal lobe. Territory of involvement is significantly greater than was present on the previous MRI of 04/07/2020. There are a few tiny punctate areas of relative increased attenuation within the adjacent cortex which could reflect petechial microhemorrhage. No large/space occupying hemorrhage. Critical Value/emergent results were called by telephone at the time of interpretation on 04/16/2020 at 12:17 pm to provider Veryl Speak , who verbally acknowledged these results. Electronically Signed   By: Davina Poke D.O.   On: 04/16/2020 12:19   MR ANGIO HEAD WO CONTRAST  Result Date: 04/16/2020 CLINICAL DATA:  86 year old female with headache and dizziness. Recent aphasia and recent MRI 04/07/2020 demonstrating a small area of left posterior temporal lobe ischemia. No tPA given at that time. Progression on noncontrast head CT today. EXAM: MRI HEAD WITHOUT CONTRAST MRA HEAD WITHOUT CONTRAST TECHNIQUE: Multiplanar, multiecho pulse sequences of the brain and surrounding structures were obtained without intravenous contrast. Angiographic images of the head were obtained using MRA technique without contrast. COMPARISON:  Head CT earlier today. Brain MRI 11/03/2019. Intracranial MRA 03/10/2019. FINDINGS: MRI HEAD FINDINGS Brain: Nearly 6 cm area now of heterogeneous restricted diffusion and susceptibility due to parenchymal blood products in the posterior left temporal and lateral left occipital lobes (series 4, image 25 and series 7, image 12).  Associated cytotoxic edema. No significant mass effect. No other restricted diffusion. No midline shift, mass effect, evidence of mass lesion, ventriculomegaly, extra-axial collection or other acute intracranial hemorrhage. Cervicomedullary junction and pituitary are within normal limits. Stable chronic lacunar infarct in the medial left thalamus. Possible tiny chronic infarct in the right cerebellum on series 9, image 6. No cortical encephalomalacia identified. Stable gray and white matter signal elsewhere. Vascular: Major intracranial vascular flow voids are stable. Skull and upper cervical spine: Negative for age visible cervical spine. Stable bone marrow signal. Sinuses/Orbits: Stable, negative. Other: Mastoids remain clear. Visible internal auditory structures appear normal. MRA HEAD FINDINGS Antegrade flow in the posterior circulation appears stable  since the 2020 MRA. Dominant right vertebral artery. Both PICA appear to remain patent. No distal vertebral stenosis. Mildly fenestrated vertebrobasilar junction suspected (normal variant). No basilar stenosis. Patent SCA origins. Normal left PCA origin. Fetal type right PCA again noted. There is a left posterior communicating artery. Bilateral PCA branches are stable and within normal limits. Antegrade flow in both ICA siphons appears stable since last year. Ophthalmic and posterior communicating artery origins are within normal limits. No siphon stenosis identified. Patent MCA and ACA origins. Anterior communicating artery and visible ACA branches are within normal limits. Right MCA M1 is tortuous. Right MCA bifurcation and visible right MCA branches appear stable and within normal limits. Left MCA M1 segment and left MCA trifurcation appears stable and within normal limits. No left MCA branch occlusion can be identified. IMPRESSION: 1. Progressed posterior left MCA ischemia since 04/07/2020 with associated Petechial Hemorrhage. But no malignant hemorrhagic  transformation or mass effect. 2. Intracranial MRA appears stable since last year and is negative for large vessel occlusion. 3. No other areas of recent ischemia. Chronic lacunar infarct of the left thalamus. Electronically Signed   By: Genevie Ann M.D.   On: 04/16/2020 17:13   MR BRAIN WO CONTRAST  Result Date: 04/16/2020 CLINICAL DATA:  86 year old female with headache and dizziness. Recent aphasia and recent MRI 04/07/2020 demonstrating a small area of left posterior temporal lobe ischemia. No tPA given at that time. Progression on noncontrast head CT today. EXAM: MRI HEAD WITHOUT CONTRAST MRA HEAD WITHOUT CONTRAST TECHNIQUE: Multiplanar, multiecho pulse sequences of the brain and surrounding structures were obtained without intravenous contrast. Angiographic images of the head were obtained using MRA technique without contrast. COMPARISON:  Head CT earlier today. Brain MRI 11/03/2019. Intracranial MRA 03/10/2019. FINDINGS: MRI HEAD FINDINGS Brain: Nearly 6 cm area now of heterogeneous restricted diffusion and susceptibility due to parenchymal blood products in the posterior left temporal and lateral left occipital lobes (series 4, image 25 and series 7, image 12). Associated cytotoxic edema. No significant mass effect. No other restricted diffusion. No midline shift, mass effect, evidence of mass lesion, ventriculomegaly, extra-axial collection or other acute intracranial hemorrhage. Cervicomedullary junction and pituitary are within normal limits. Stable chronic lacunar infarct in the medial left thalamus. Possible tiny chronic infarct in the right cerebellum on series 9, image 6. No cortical encephalomalacia identified. Stable gray and white matter signal elsewhere. Vascular: Major intracranial vascular flow voids are stable. Skull and upper cervical spine: Negative for age visible cervical spine. Stable bone marrow signal. Sinuses/Orbits: Stable, negative. Other: Mastoids remain clear. Visible internal  auditory structures appear normal. MRA HEAD FINDINGS Antegrade flow in the posterior circulation appears stable since the 2020 MRA. Dominant right vertebral artery. Both PICA appear to remain patent. No distal vertebral stenosis. Mildly fenestrated vertebrobasilar junction suspected (normal variant). No basilar stenosis. Patent SCA origins. Normal left PCA origin. Fetal type right PCA again noted. There is a left posterior communicating artery. Bilateral PCA branches are stable and within normal limits. Antegrade flow in both ICA siphons appears stable since last year. Ophthalmic and posterior communicating artery origins are within normal limits. No siphon stenosis identified. Patent MCA and ACA origins. Anterior communicating artery and visible ACA branches are within normal limits. Right MCA M1 is tortuous. Right MCA bifurcation and visible right MCA branches appear stable and within normal limits. Left MCA M1 segment and left MCA trifurcation appears stable and within normal limits. No left MCA branch occlusion can be identified. IMPRESSION: 1. Progressed posterior  left MCA ischemia since 04/07/2020 with associated Petechial Hemorrhage. But no malignant hemorrhagic transformation or mass effect. 2. Intracranial MRA appears stable since last year and is negative for large vessel occlusion. 3. No other areas of recent ischemia. Chronic lacunar infarct of the left thalamus. Electronically Signed   By: Genevie Ann M.D.   On: 04/16/2020 17:13    EKG: Independently reviewed.  Assessment/Plan Principal Problem:   Extension of stroke [facility]) Active Problems:   Hypertension   Hyperlipidemia    1. Extension of stroke - 1. See neurology consult note 2. Stop plavix, cont ASA 81 3. Tele monitor 4. PT/OT/SLP 5. Repeat CT head in AM 6. Pt really needs hearing aid so we can better differentiate how much of her difficulty is due to aphasia vs how much is due to hearing loss. 2. HTN - 1. Cont home BP meds 2. Add  PRN hydralazine 3. HLD - 1. Allergies to numerous cholesterol meds  DVT prophylaxis: SCDs - due to peticheal hemorrhages with stroke Code Status: DNR Family Communication: Spoke with son on phone Disposition Plan: SNF after admit Consults called: Neurology Admission status: Place in 15    [place], [place] Hospitalists  How to contact the [facility] Attending or Consulting provider [place] or covering provider during after hours [place], for this patient?  1. Check the care team in [facility] and look for a) attending/consulting [facility] provider listed and b) the [facility] team listed 2. Log into [email or web address]  Amion Physician Scheduling and messaging for groups and whole hospitals  On call and physician scheduling software for group practices, residents, hospitalists and other medical providers for call, clinic, rotation and shift schedules. On[employer] is a hospital-wide system for scheduling doctors and paging doctors on call. EasyPlot is for scientific plotting and data analysis.  [email or web address]  and use [facility]'s universal password to access. If you do not have the password, please contact the hospital operator.  3. Locate the [facility] provider you are looking for under [facility] and page to a number that you can be directly reached. 4. If you still have difficulty reaching the provider, please page the [facility] (Director on Call) for the Hospitalists listed on amion for assistance.  04/16/2020, 9:54 PM

## 2020-04-16 NOTE — ED Provider Notes (Signed)
[facility] EMERGENCY DEPARTMENT Provider Note   CSN: [id number] Arrival date & time: 04/16/20  1054     History Chief Complaint  Patient presents with  . Headache    [name] is a 86 y.o. female.  Patient is an 86 year old female with extensive past medical history including coronary artery disease, hypertension, hyperlipidemia, chronic renal insufficiency.  She is sent today from her extended care facility for evaluation of headache.  Patient is hard of hearing and somewhat difficult to get a history from, however she denies to me she is experiencing any discomfort at present.  There was the mention of slowed speech, however appears clear upon arrival.  Patient is unsure as to why she is here.  The history is provided by the patient.       Past Medical History:  Diagnosis Date  . Arthritis   . CAD (coronary artery disease)    a. Mod prox LAD & first diagonal stenosis by cath 2002 - managed medically since that time with normal LV function.  . Cataract   . Hyperlipidemia   . Hypertension     Patient Active Problem List   Diagnosis Date Noted  . Dyslipidemia   . Stage 3a chronic kidney disease   . Demand ischemia ([place])   . Upper back pain 04/08/2020  . Thrombocytopenia ([place]) 04/08/2020  . Acute CVA (cerebrovascular accident) ([place]) 04/07/2020  . TIA (transient ischemic attack) 03/09/2019  . Pressure injury of skin 05/21/2018  . Incarcerated incisional hernia - reduced 05/19/2018  . SBO (small bowel obstruction) ([place]) 05/18/2018  . Bilateral primary osteoarthritis of knee 07/22/2017  . Hyperlipidemia 05/20/2014  . Hypotension 08/02/2013  . UTI (lower urinary tract infection) 08/02/2013  . Acute renal failure ([place]) 08/02/2013  . ARF (acute renal failure) ([place]) 08/02/2013  . Calculus of gallbladder with acute cholecystitis, without mention of obstruction 07/10/2013  . Acute cholecystitis 06/17/2013  . Chest pain 06/17/2013  . Pulmonary  infiltrate in left lung on chest x-ray 06/17/2013  . Hypertension   . CAD (coronary artery disease)     Past Surgical History:  Procedure Laterality Date  . ABDOMINAL HYSTERECTOMY    . APPENDECTOMY    . BACK SURGERY    . CARPAL TUNNEL RELEASE    . CHOLECYSTECTOMY N/A 06/19/2013   Procedure: LAPAROSCOPIC CHOLECYSTECTOMY;  Surgeon: Harl Bowie, MD;  Location: [facility];  Service: General;  Laterality: N/A;  . ESOPHAGOGASTRODUODENOSCOPY N/A 04/26/2017   Procedure: ESOPHAGOGASTRODUODENOSCOPY (EGD) W/ DILATION;  Surgeon: Ronnette Juniper, MD;  Location: [facility];  Service: Gastroenterology;  Laterality: N/A;  . HERNIA REPAIR    . INSERTION OF MESH  05/19/2018   Procedure: INSERTION OF MESH;  Surgeon: Excell Seltzer, MD;  Location: [facility];  Service: General;;  . IR GENERIC HISTORICAL  03/18/2016   IR RADIOLOGIST EVAL & MGMT 03/18/2016 Corrie Mckusick, DO [facility] INTERV RAD  . KNEE SURGERY    . SHOULDER SURGERY    . SPLENECTOMY, TOTAL    . VENTRAL HERNIA REPAIR N/A 05/19/2018   Procedure: HERNIA REPAIR VENTRAL ADULT;  Surgeon: Excell Seltzer, MD;  Location: [facility];  Service: General;  Laterality: N/A;     OB History   No obstetric history on file.     Family History  Problem Relation Age of Onset  . Heart disease Mother        "enlarged valve"  . Cancer Mother        Ovarian  . Stroke Father   .  Cancer Sister        Colon    Social History   Tobacco Use  . Smoking status: Never Smoker  . Smokeless tobacco: Never Used  Vaping Use  . Vaping Use: Never used  Substance Use Topics  . Alcohol use: No  . Drug use: No    Home Medications Prior to Admission medications   Medication Sig Start Date End Date Taking? Authorizing Provider  aspirin EC 81 MG EC tablet Take 1 tablet (81 mg total) by mouth daily for 20 days. Swallow whole. 04/11/20 05/01/20  Swayze, Ava, DO  clopidogrel (PLAVIX) 75 MG tablet Take 1 tablet (75 mg total) by mouth daily. 04/11/20 05/11/20  Swayze, Ava, DO    labetalol (NORMODYNE) 100 MG tablet Take 1 tablet (100 mg total) by mouth 2 (two) times daily. 04/11/20   Swayze, Ava, DO  lisinopril (ZESTRIL) 20 MG tablet Take 1 tablet (20 mg total) by mouth daily. 04/12/20   Swayze, Ava, DO  Multiple Vitamins-Minerals (PRESERVISION AREDS PO) Take 1 capsule by mouth daily.     [provider]  Omega-3 Fatty Acids (FISH OIL PO) Take 1 capsule by mouth daily.     [provider]  traZODone (DESYREL) 50 MG tablet Take 25 mg by mouth at bedtime as needed for sleep.  09/28/18   [provider]    Allergies    Felodipine, Prednisone, Shrimp [shellfish allergy], Codeine, Crestor [rosuvastatin], Feldene [piroxicam], Lescol [fluvastatin], Lipitor [atorvastatin], Nexium [esomeprazole], Pravachol [pravastatin], Prilosec otc [omeprazole magnesium], Statins, Welchol [colesevelam], Zetia [ezetimibe], Zithromax [azithromycin], Zocor [simvastatin], and Omeprazole  Review of Systems   Review of Systems  All other systems reviewed and are negative.   Physical Exam Updated Vital Signs BP (!) 156/54 (BP Location: Right Arm)   Pulse (!) 56   Temp 98.4 F (36.9 C) (Oral)   Resp 18   SpO2 99%   Physical Exam Vitals and nursing note reviewed.  Constitutional:      General: She is not in acute distress.    Appearance: She is well-developed. She is not diaphoretic.  HENT:     Head: Normocephalic and atraumatic.  Eyes:     General: No visual field deficit.    Extraocular Movements: Extraocular movements intact.     Pupils: Pupils are equal, round, and reactive to light.  Cardiovascular:     Rate and Rhythm: Normal rate and regular rhythm.     Heart sounds: No murmur heard.  No friction rub. No gallop.   Pulmonary:     Effort: Pulmonary effort is normal. No respiratory distress.     Breath sounds: Normal breath sounds. No wheezing.  Abdominal:     General: Bowel sounds are normal. There is no distension.     Palpations: Abdomen is soft.      Tenderness: There is no abdominal tenderness.  Musculoskeletal:        General: Normal range of motion.     Cervical back: Normal range of motion and neck supple.  Skin:    General: Skin is warm and dry.  Neurological:     Mental Status: She is alert and oriented to person, place, and time.     Cranial Nerves: No cranial nerve deficit or facial asymmetry.     Motor: No weakness.     Coordination: Coordination normal.     ED Results / Procedures / Treatments   Labs (all labs ordered are listed, but only abnormal results are displayed) Labs Reviewed - No  data to display  EKG EKG Interpretation  Date/Time:  Wednesday April 16 2020 10:56:51 EDT Ventricular Rate:  56 PR Interval:    QRS Duration: 100 QT Interval:  425 QTC Calculation: 411 R Axis:   99 Text Interpretation: Sinus rhythm Prolonged PR interval Low voltage, precordial leads Anteroseptal infarct, age indeterminate No significant change since 04/09/2020 Confirmed by Veryl Speak [phone]) on 04/16/2020 11:04:22 AM   Radiology No results found.  Procedures Procedures (including critical care time)  Medications Ordered in ED Medications - No data to display  ED Course  I have reviewed the triage vital signs and the nursing notes.  Pertinent labs & imaging results that were available during my care of the patient were reviewed by me and considered in my medical decision making (see chart for details).    MDM Rules/Calculators/A&P  Patient sent from her extended care facility for evaluation of headache and sluggishness.  Patient recently admitted for stroke.  Patient's laboratory studies unremarkable, but CT scan does show an increased size of the previous infarct.  This finding was discussed with neurology who has recommended patient undergo MRI to determine whether her infarct is increasing in size.  This study has been ordered and is pending.  Care will be signed out to Dr. Darl Householder at shift change.  He will  obtain the results of the MRI, speak with neurology, and determine the final disposition.  Final Clinical Impression(s) / ED Diagnoses Final diagnoses:  None    Rx / DC Orders ED Discharge Orders    None       Veryl Speak, MD 04/17/20 1514

## 2020-04-16 NOTE — Consult Note (Signed)
Neurology Consultation Reason for Consult: Stroke Referring Physician: Darl Householder, D  CC: Stroke  History is obtained from: Patient  HPI: [name] is a 86 y.o. female with a history of recent M3 occlusion with resulting aphasia.  She was doing okay at nursing home until she started complaining of dizziness and headache which prompted her to be brought back to the emergency department for further evaluation.  Here, a repeat MRI was obtained which shows significant increase in the size of her infarct, all in the region of the known M3 occlusion.  She is severely hard of hearing, and has some aphasia, limiting my attempts at taking history.  LKW: 12 PM on 8/23 tpa given?: no, outside of window  Work-up during the previous hospitalization included: Echocardiogram-EF 60 to 65% Telemetry-no atrial fibrillation seen LDL 144-documented allergies to multiple statins, recommended consideration of [facility]-9   ROS: Limited due to hearing and aphasia  Past Medical History:  Diagnosis Date  . Arthritis   . CAD (coronary artery disease)    a. Mod prox LAD & first diagonal stenosis by cath 2002 - managed medically since that time with normal LV function.  . Cataract   . Hyperlipidemia   . Hypertension      Family History  Problem Relation Age of Onset  . Heart disease Mother        "enlarged valve"  . Cancer Mother        Ovarian  . Stroke Father   . Cancer Sister        Colon     Social History:  reports that she has never smoked. She has never used smokeless tobacco. She reports that she does not drink alcohol and does not use drugs.   Exam: Current vital signs: BP (!) 197/61   Pulse (!) 56   Temp 98.4 F (36.9 C) (Oral)   Resp 19   Ht 5\' 3"  (1.6 m)   Wt 64.9 kg   SpO2 97%   BMI 25.33 kg/m  Vital signs in last 24 hours: Temp:  [98.4 F (36.9 C)] 98.4 F (36.9 C) (09/01 1055) Pulse Rate:  [50-56] 56 (09/01 1920) Resp:  [14-23] 19 (09/01 1920) BP: (151-197)/(54-102)  197/61 (09/01 1920) SpO2:  [96 %-100 %] 97 % (09/01 1920) Weight:  [64.9 kg] 64.9 kg (09/01 1300)   Physical Exam  Constitutional: Appears well-developed and well-nourished.  Psych: Affect appropriate to situation Eyes: No scleral injection HENT: No OP obstrucion MSK: no joint deformities.  Cardiovascular: Normal rate and regular rhythm.  Respiratory: Effort normal, non-labored breathing GI: Soft.  No distension. There is no tenderness.  Skin: WDI  Neuro: Mental Status: Patient is awake, alert, she has significant difficulty hearing, but appears to understand and follow commands whenever she is able to hear.  She makes frequent word substitution and sound substitution errors.  When asked what the date is she answers it is " One-day in [place].. No September" she is unable to give the year. Cranial Nerves: II: She responds to visual stimuli in all four visual quadrants pupils are equal, round, and reactive to light.   III,IV, VI: EOMI without ptosis or diploplia.  V: I am not sure of the reliability of her answers, but she endorses symmetric sensation VII: Facial movement is symmetric.  Motor: She has very mild drift in the right upper extremity, but equal to confrontation.  Sensory: She endorses symmetric sensation, though again I am not certain of the reliability of her answers.  Cerebellar: Does not perform   I have reviewed labs in epic and the results pertinent to this consultation are: BMP is unremarkable  I have reviewed the images obtained: MRI brain-petechial hemorrhage, which is also minimally visible on CT.    Impression: 86 year old female with ischemic infarct secondary to M3 occlusion.  I do not feel that this represents a new event, simply extension of her old infarct and therefore do not think that any further work-up is currently indicated other than what is already planned.  I do, however, think that observation with repeat head CT in the morning would not be  unreasonable, also could get a repeat evaluation by speech therapy.  Recommendations: 1) repeat head CT in the morning 2) discontinue Plavix, continue aspirin at 81 mg daily 3) continue therapies 4) telemetry monitoring  5) stroke team to follow   Roland Rack, MD [facility] [phone]  If 7pm- 7am, please page neurology on call as listed in [place].

## 2020-04-16 NOTE — ED Notes (Signed)
Patient transported to CT 

## 2020-04-16 NOTE — ED Provider Notes (Signed)
  Physical Exam  BP (!) 173/61   Pulse (!) 51   Temp 98.4 F (36.9 C) (Oral)   Resp 18   Ht 5\' 3"  (1.6 m)   Wt 64.9 kg   SpO2 96%   BMI 25.33 kg/m   Physical Exam  ED Course/Procedures     Procedures  MDM  Care assumed at 3 PM.  Patient has some dizziness and headaches.  Patient recently had a stroke.  Her CT showed acute to subacute stroke.  Neurology was consulted and recommended MRI.  She had a M3 occlusion at that time so I added on an MRA.  5:36 PM MRI showed progression of L MCA ischemia. Talked to Dr. Lorrin Goodell from neurology. He will see patient as consult and review her recent workup.   9:12 PM Dr. Leonel Ramsay from neurology saw patient and recommend admission given that the stroke has progressed.  Recommend that blood pressure less than 180.  I ordered hydralazine.    Drenda Freeze, MD 04/16/20 2113

## 2020-04-16 NOTE — ED Notes (Signed)
[name] son [phone] would like an update on the pt

## 2020-04-17 ENCOUNTER — Observation Stay (HOSPITAL_COMMUNITY): Payer: Medicare HMO

## 2020-04-17 DIAGNOSIS — Z7401 Bed confinement status: Secondary | ICD-10-CM | POA: Diagnosis not present

## 2020-04-17 DIAGNOSIS — R519 Headache, unspecified: Secondary | ICD-10-CM | POA: Diagnosis not present

## 2020-04-17 DIAGNOSIS — I10 Essential (primary) hypertension: Secondary | ICD-10-CM | POA: Diagnosis not present

## 2020-04-17 DIAGNOSIS — R4182 Altered mental status, unspecified: Secondary | ICD-10-CM | POA: Diagnosis not present

## 2020-04-17 DIAGNOSIS — M2550 Pain in unspecified joint: Secondary | ICD-10-CM | POA: Diagnosis not present

## 2020-04-17 DIAGNOSIS — R410 Disorientation, unspecified: Secondary | ICD-10-CM | POA: Diagnosis not present

## 2020-04-17 DIAGNOSIS — I639 Cerebral infarction, unspecified: Secondary | ICD-10-CM | POA: Diagnosis not present

## 2020-04-17 LAB — LIPID PANEL
Cholesterol: 196 mg/dL (ref 0–200)
HDL: 42 mg/dL (ref >40)
LDL Cholesterol: 132 mg/dL — ABNORMAL HIGH (ref 0–99)
Total CHOL/HDL Ratio: 4.7 RATIO
Triglycerides: 108 mg/dL (ref <150)
VLDL: 22 mg/dL (ref 0–40)

## 2020-04-17 LAB — HEMOGLOBIN A1C
Hgb A1c MFr Bld: 6.3 % — ABNORMAL HIGH (ref 4.8–5.6)
Mean Plasma Glucose: 134.11 mg/dL

## 2020-04-17 LAB — SARS CORONAVIRUS 2 BY RT PCR (HOSPITAL ORDER, PERFORMED IN ~~LOC~~ HOSPITAL LAB): SARS Coronavirus 2: NEGATIVE

## 2020-04-17 MED ORDER — ACETAMINOPHEN 325 MG PO TABS: 650.0000 mg | ORAL_TABLET | Freq: Four times a day (QID) | ORAL | Status: DC | PRN

## 2020-04-17 MED ORDER — XANAX 0.25 MG PO TABS: 0.2500 mg | ORAL_TABLET | Freq: Three times a day (TID) | ORAL | 0 refills | Status: DC | PRN

## 2020-04-17 MED ORDER — NITROGLYCERIN 2 % TD OINT
0.5000 [in_us] | TOPICAL_OINTMENT | Freq: Four times a day (QID) | TRANSDERMAL | Status: AC
  Administered 2020-04-17 (×2): 0.5 [in_us] via TOPICAL
  Filled 2020-04-17 (×2): qty 1

## 2020-04-17 MED ORDER — WHITE PETROLATUM EX OINT
TOPICAL_OINTMENT | CUTANEOUS | Status: AC
  Administered 2020-04-17: 0.2
  Filled 2020-04-17: qty 28.35

## 2020-04-17 MED ORDER — TICAGRELOR 90 MG PO TABS: 90.0000 mg | ORAL_TABLET | Freq: Two times a day (BID) | ORAL | Status: AC

## 2020-04-17 NOTE — Social Work (Signed)
Clinical Social Worker facilitated patient discharge including contacting patient family and facility to confirm patient discharge plans.  Clinical information faxed to facility and family agreeable with plan.  CSW arranged ambulance transport via [place] [facility] to [facility] RN to call [phone]  with report prior to discharge.  Clinical Social Worker will sign off for now as social work intervention is no longer needed. Please consult [place] again if new need arises.  Westley Hummer, MSW, LCSW Clinical Social Worker

## 2020-04-17 NOTE — Evaluation (Addendum)
Occupational Therapy Evaluation Patient Details Name: [name] MRN: [id number] DOB: 1929-04-20 Today's Date: 04/17/2020    History of Present Illness Patient is a 86 y/o female with PMH of HTN, OA, hearing loss, cataract, CAD,  presenting with slurred speech. Admitted 8/23-27 for CVA with aphasia, dc to SNF--c/o dizziness and headache returned to ED 9/1. MRI brain shows significant extension of CVA in posterior L MCA and associated petechial hemorrhage.    Clinical Impression   Patient admitted for above and limited by problem list below, including impaired cognition, impaired communication, impaired balance, generalized weakness and decreased activity tolerance. Patient oriented to self and place, but not to situation, follows simple commands but poor awareness of deficits and incontinent of bladder during session with decreased awareness; pt also HOH. Patient currently requires mod assist +2 for toilet transfers to [facility], total assist +2 for toileting and LB ADLs and min-mod assist for UB ADLs.  She recently dc'd to SNF after initial stroke on 8/27 and has been requiring assist for ADLs/mobility since.  Believe she will benefit from further OT services acutely and after dc at SNF level to optimize independence and safety for return to PLOF. Will follow.     Follow Up Recommendations  SNF;Supervision/Assistance - 24 hour    Equipment Recommendations  Other (comment) (TBD at next venue of care )    Recommendations for Other Services       Precautions / Restrictions Precautions Precautions: Fall Restrictions Weight Bearing Restrictions: No      Mobility Bed Mobility Overal bed mobility: Needs Assistance Bed Mobility: Supine to Sit;Sit to Supine     Supine to sit: Supervision Sit to supine: Min guard   General bed mobility comments: increased time and effort, HOB elevated and use of rail but only min guard for safety to return supine   Transfers Overall transfer level: Needs  assistance Equipment used: None Transfers: Sit to/from [facility] Sit to Stand: Mod assist;+2 physical assistance;+2 safety/equipment Stand pivot transfers: Min assist;+2 physical assistance;+2 safety/equipment       General transfer comment: to power up and steady, min assist to pivot to/from [facility] with cueing for technique     Balance Overall balance assessment: Needs assistance Sitting-balance support: Feet supported;No upper extremity supported Sitting balance-Leahy Scale: Fair     Standing balance support: Bilateral upper extremity supported;During functional activity Standing balance-Leahy Scale: Poor Standing balance comment: relies on BUE and external support                           ADL either performed or assessed with clinical judgement   ADL Overall ADL's : Needs assistance/impaired     Grooming: Minimal assistance;Sitting   Upper Body Bathing: Sitting;Moderate assistance   Lower Body Bathing: Total assistance;+2 for physical assistance;+2 for safety/equipment;Sit to/from stand   Upper Body Dressing : Sitting;Moderate assistance   Lower Body Dressing: Total assistance;+2 for physical assistance;+2 for safety/equipment;Sit to/from stand   Toilet Transfer: Moderate assistance;+2 for physical assistance;+2 for safety/equipment;Stand-pivot;BSC   Toileting- Clothing Manipulation and Hygiene: Total assistance;+2 for physical assistance;+2 for safety/equipment;Sit to/from stand       Functional mobility during ADLs: Moderate assistance;+2 for physical assistance;+2 for safety/equipment;Cueing for safety;Cueing for sequencing General ADL Comments: pt limited by generalized weakness, impaired cognition, impaired balnace      Vision   Additional Comments: further assessment required     Perception     Praxis      Pertinent  Vitals/Pain Pain Assessment: No/denies pain     Hand Dominance Right   Extremity/Trunk Assessment Upper  Extremity Assessment Upper Extremity Assessment: Generalized weakness   Lower Extremity Assessment Lower Extremity Assessment: Defer to PT evaluation   Cervical / Trunk Assessment Cervical / Trunk Assessment: Kyphotic   Communication Communication Communication: Expressive difficulties;HOH   Cognition Arousal/Alertness: Awake/alert Behavior During Therapy: WFL for tasks assessed/performed Overall Cognitive Status: Difficult to assess Area of Impairment: Attention;Following commands;Safety/judgement;Awareness;Problem solving                   Current Attention Level: Sustained   Following Commands: Follows one step commands consistently;Follows one step commands with increased time;Follows multi-step commands inconsistently Safety/Judgement: Decreased awareness of safety;Decreased awareness of deficits Awareness: Intellectual Problem Solving: Slow processing;Difficulty sequencing;Requires verbal cues;Decreased initiation General Comments: patient able to follow simple commands with increased time, limited by [facility] and aphasia; decreased awareness to situation but oriented to self and hospital, unable to verbalize need to urinate and pt incontinent during session    General Comments       Exercises     Shoulder Instructions      Home Living Family/patient expects to be discharged to:: Skilled nursing facility                                        Prior Functioning/Environment Level of Independence: Needs assistance        Comments: patient recently dc'd to SNF for rehab since CVA (admission 8/23-27/21), prior to this admission was independent but has required assist for ADls/mobility since         OT Problem List: Decreased strength;Decreased activity tolerance;Impaired balance (sitting and/or standing);Decreased cognition;Decreased safety awareness;Decreased knowledge of use of DME or AE;Decreased knowledge of precautions      OT  Treatment/Interventions: Self-care/ADL training;Therapeutic exercise;DME and/or AE instruction;Therapeutic activities;Cognitive remediation/compensation;Patient/family education;Balance training    OT Goals(Current goals can be found in the care plan section) Acute Rehab OT Goals Patient Stated Goal: unable to state  OT Goal Formulation: Patient unable to participate in goal setting Time For Goal Achievement: 05/01/20 Potential to Achieve Goals: Good  OT Frequency: Min 2X/week   Barriers to D/C:            Co-evaluation PT/OT/SLP Co-Evaluation/Treatment: Yes Reason for Co-Treatment: For patient/therapist safety;To address functional/ADL transfers   OT goals addressed during session: ADL's and self-care      AM-PAC OT "6 Clicks" Daily Activity     Outcome Measure Help from another person eating meals?: A Little Help from another person taking care of personal grooming?: A Little Help from another person toileting, which includes using toliet, bedpan, or urinal?: Total Help from another person bathing (including washing, rinsing, drying)?: A Lot Help from another person to put on and taking off regular upper body clothing?: A Little Help from another person to put on and taking off regular lower body clothing?: Total 6 Click Score: 13   End of Session Equipment Utilized During Treatment: Gait belt Nurse Communication: Mobility status  Activity Tolerance: Patient tolerated treatment well Patient left: in bed;with call bell/phone within reach;with nursing/sitter in room;Other (comment) (transport arrived to take to CT )  OT Visit Diagnosis: Other abnormalities of gait and mobility (R26.89);Muscle weakness (generalized) (M62.81);Cognitive communication deficit (R41.841);Other symptoms and signs involving the nervous system (R29.898);Other symptoms and signs involving cognitive function Symptoms and signs involving cognitive functions: Cerebral infarction  Time:  6002-9847 OT Time Calculation (min): 21 min Charges:  OT General Charges $OT Visit: 1 Visit OT Evaluation $OT Eval Moderate Complexity: 1 Mod  Jolaine Artist, OT Acute Rehabilitation Services Pager [phone] Office [phone]   Delight Stare 04/17/2020, 9:50 AM

## 2020-04-17 NOTE — Progress Notes (Signed)
Received report; was notified that pt failed swallow study. Will atttempt to utilize IV b/p meds for increased b/p

## 2020-04-17 NOTE — Progress Notes (Signed)
   04/17/20 0047  Assess: MEWS Score  MEWS Temp 0  MEWS Systolic 2  MEWS Pulse 0  MEWS RR 0  MEWS LOC 0  MEWS Score 2  MEWS Score Color Yellow  Assess: if the MEWS score is Yellow or Red  Were vital signs taken at a resting state? No  Focused Assessment  (initial assessment. MD notified)  Early Detection of Sepsis Score *See Row Information* Low  MEWS guidelines implemented *See Row Information* No, other (Comment)  Treat  MEWS Interventions Administered prn meds/treatments

## 2020-04-17 NOTE — Progress Notes (Signed)
Pt unable to follow directions to do NIH assessment. Kept repeating certain words but no ending to them. MD paged concerning b/p and failed swallow study. Pt in room and eyes closed presently.

## 2020-04-17 NOTE — Evaluation (Signed)
Clinical/Bedside Swallow Evaluation Patient Details  Name: [name] MRN: [id number] Date of Birth: [birth year]  Today's Date: 04/17/2020 Time: SLP Start Time (ACUTE ONLY): 0956 SLP Stop Time (ACUTE ONLY): 1010 SLP Time Calculation (min) (ACUTE ONLY): 14 min  Past Medical History:  Past Medical History:  Diagnosis Date  . Arthritis   . CAD (coronary artery disease)    a. Mod prox LAD & first diagonal stenosis by cath 2002 - managed medically since that time with normal LV function.  . Cataract   . Hyperlipidemia   . Hypertension    Past Surgical History:  Past Surgical History:  Procedure Laterality Date  . ABDOMINAL HYSTERECTOMY    . APPENDECTOMY    . BACK SURGERY    . CARPAL TUNNEL RELEASE    . CHOLECYSTECTOMY N/A 06/19/2013   Procedure: LAPAROSCOPIC CHOLECYSTECTOMY;  Surgeon: Harl Bowie, MD;  Location: [place];  Service: General;  Laterality: N/A;  . ESOPHAGOGASTRODUODENOSCOPY N/A 04/26/2017   Procedure: ESOPHAGOGASTRODUODENOSCOPY (EGD) W/ DILATION;  Surgeon: Ronnette Juniper, MD;  [facility];  Service: Gastroenterology;  Laterality: N/A;  . HERNIA REPAIR    . INSERTION OF MESH  05/19/2018   Procedure: INSERTION OF MESH;  Surgeon: Excell Seltzer, MD;  [facility];  Service: General;;  . IR GENERIC HISTORICAL  03/18/2016   IR RADIOLOGIST EVAL & MGMT 03/18/2016 Corrie Mckusick, DO [facility] INTERV RAD  . KNEE SURGERY    . SHOULDER SURGERY    . SPLENECTOMY, TOTAL    . VENTRAL HERNIA REPAIR N/A 05/19/2018   Procedure: HERNIA REPAIR VENTRAL ADULT;  Surgeon: Excell Seltzer, MD;  [facility];  Service: General;  Laterality: N/A;   HPI:  Pt is an 86 y.o. female with medical history significant of HLD, HTN, CHF who was admitted 8/23-8/27 for stroke with aphasia. She was admitted secondary to c/o dizziness and headache. MRI brain 9/1: Progressed posterior left MCA ischemia since 04/07/2020 with associated petechial hemorrhage. BSE 8/24: hx of esophageal dysphagia  per family report including repeat esophageal dilations, minimal oral dysphagia exhibited s/p left frontal CVA with reduced labial seal and intermittent trace right anterior spillage without consistent awareness exhibited with POs but no s/sx of aspiration demonstrated.   Assessment / Plan / Recommendation Clinical Impression  Pt was seen for bedside swallow evaluation. She denied a history of dysphagia but the extent of her comprehension is questioned due to aphasia. Oral mechanism exam was limited due to pt's difficulty following commands. Dentition was adequate. Her oropharyngeal swallow mechanism was within functional limits without any overt signs of penetration/aspiration. A regular texture diet with thin liquids is recommended at this time and SLP will follow to ensure safety.  SLP Visit Diagnosis: Dysphagia, unspecified (R13.10)    Aspiration Risk  Mild aspiration risk    Diet Recommendation Regular;Thin liquid   Liquid Administration via: Cup;Straw Medication Administration: Whole meds with liquid Supervision: Patient able to self feed Compensations: Minimize environmental distractions;Slow rate;Small sips/bites;Follow solids with liquid Postural Changes: Seated upright at 90 degrees    Other  Recommendations Oral Care Recommendations: Oral care BID   Follow up Recommendations Skilled Nursing facility      Frequency and Duration min 1 x/week  1 week       Prognosis Prognosis for Safe Diet Advancement: Good Barriers to Reach Goals: Language deficits;Time post onset      Swallow Study   General Date of Onset: 04/08/20 HPI: Pt is an 86 y.o. female with medical history significant of HLD,  HTN, CHF who was admitted 8/23-8/27 for stroke with aphasia. She was admitted secondary to c/o dizziness and headache. MRI brain 9/1: Progressed posterior left MCA ischemia since 04/07/2020 with associated petechial hemorrhage. BSE 8/24: hx of esophageal dysphagia per family report including  repeat esophageal dilations, minimal oral dysphagia exhibited s/p left frontal CVA with reduced labial seal and intermittent trace right anterior spillage without consistent awareness exhibited with POs but no s/sx of aspiration demonstrated. Type of Study: Bedside Swallow Evaluation Previous Swallow Assessment: See HPI Diet Prior to this Study: NPO Temperature Spikes Noted: No Respiratory Status: Room air History of Recent Intubation: No Behavior/Cognition: Alert;Requires cueing Oral Cavity Assessment: Within Functional Limits Oral Care Completed by SLP: No Oral Cavity - Dentition: Adequate natural dentition Vision: Functional for self-feeding Self-Feeding Abilities: Needs assist Patient Positioning: Upright in bed;Postural control adequate for testing Baseline Vocal Quality: Normal Volitional Cough: Cognitively unable to elicit Volitional Swallow: Able to elicit    Oral/Motor/Sensory Function Overall Oral Motor/Sensory Function: Mild impairment (Pt exhibited difficulty following commands)   Ice Chips Ice chips: Within functional limits Presentation: Spoon   Thin Liquid Thin Liquid: Within functional limits Presentation: Cup;Straw    Nectar Thick Nectar Thick Liquid: Not tested   Honey Thick Honey Thick Liquid: Not tested   Puree Puree: Within functional limits Presentation: Spoon   Solid     Solid: Within functional limits Presentation: [place] I. Hardin Negus, [place], [place] Office number [phone] Pager [phone]  Horton Marshall 04/17/2020,11:31 AM

## 2020-04-17 NOTE — Evaluation (Signed)
Speech Language Pathology Evaluation Patient Details Name: [name] MRN: [id number] DOB: 1929-02-04 Today's Date: 04/17/2020 Time: 1884-1660 SLP Time Calculation (min) (ACUTE ONLY): 17 min  Problem List:  Patient Active Problem List   Diagnosis Date Noted  . Extension of stroke ([place]) 04/16/2020  . Dyslipidemia   . Stage 3a chronic kidney disease   . Demand ischemia ([place])   . Upper back pain 04/08/2020  . Thrombocytopenia ([place]) 04/08/2020  . Acute CVA (cerebrovascular accident) ([place]) 04/07/2020  . TIA (transient ischemic attack) 03/09/2019  . Pressure injury of skin 05/21/2018  . Incarcerated incisional hernia - reduced 05/19/2018  . SBO (small bowel obstruction) ([place]) 05/18/2018  . Bilateral primary osteoarthritis of knee 07/22/2017  . Hyperlipidemia 05/20/2014  . Hypotension 08/02/2013  . UTI (lower urinary tract infection) 08/02/2013  . Acute renal failure ([place]) 08/02/2013  . ARF (acute renal failure) ([place]) 08/02/2013  . Calculus of gallbladder with acute cholecystitis, without mention of obstruction 07/10/2013  . Acute cholecystitis 06/17/2013  . Chest pain 06/17/2013  . Pulmonary infiltrate in left lung on chest x-ray 06/17/2013  . Hypertension   . CAD (coronary artery disease)    Past Medical History:  Past Medical History:  Diagnosis Date  . Arthritis   . CAD (coronary artery disease)    a. Mod prox LAD & first diagonal stenosis by cath 2002 - managed medically since that time with normal LV function.  . Cataract   . Hyperlipidemia   . Hypertension    Past Surgical History:  Past Surgical History:  Procedure Laterality Date  . ABDOMINAL HYSTERECTOMY    . APPENDECTOMY    . BACK SURGERY    . CARPAL TUNNEL RELEASE    . CHOLECYSTECTOMY N/A 06/19/2013   Procedure: LAPAROSCOPIC CHOLECYSTECTOMY;  Surgeon: Harl Bowie, MD;  Location: [facility];  Service: General;  Laterality: N/A;  . ESOPHAGOGASTRODUODENOSCOPY N/A 04/26/2017   Procedure:  ESOPHAGOGASTRODUODENOSCOPY (EGD) W/ DILATION;  Surgeon: Ronnette Juniper, MD;  [facility];  Service: Gastroenterology;  Laterality: N/A;  . HERNIA REPAIR    . INSERTION OF MESH  05/19/2018   Procedure: INSERTION OF MESH;  Surgeon: Excell Seltzer, MD;  [facility];  Service: General;;  . IR GENERIC HISTORICAL  03/18/2016   IR RADIOLOGIST EVAL & MGMT 03/18/2016 Corrie Mckusick, DO [facility] INTERV RAD  . KNEE SURGERY    . SHOULDER SURGERY    . SPLENECTOMY, TOTAL    . VENTRAL HERNIA REPAIR N/A 05/19/2018   Procedure: HERNIA REPAIR VENTRAL ADULT;  Surgeon: Excell Seltzer, MD;  [facility];  Service: General;  Laterality: N/A;   HPI:  Pt is an 86 y.o. female with medical history significant of HLD, HTN, CHF who was admitted 8/23-8/27 for stroke with aphasia. She was admitted secondary to c/o dizziness and headache. MRI brain 9/1: Progressed posterior left MCA ischemia since 04/07/2020 with associated petechial hemorrhage. BSE 8/24: hx of esophageal dysphagia per family report including repeat esophageal dilations, minimal oral dysphagia exhibited s/p left frontal CVA with reduced labial seal and intermittent trace right anterior spillage without consistent awareness exhibited with POs but no s/sx of aspiration demonstrated.   Assessment / Plan / Recommendation Clinical Impression  Pt participated in speech/language evaluation. Pt has a documented recent history of aphasia and was most recently seen for aphasia treatment here on 04/11/20. Pt presented with fluent aphasia characterized by impairments in receptive and expressive language with more notable difficulty with comprehension. She exhibited difficulty following one and two-step commands commands  and answering more complex yes/no questions. Pt was able to converse at the sentence level but exhibited difficulty with word retrieval and paraphasias were intermittently noted. Pt demonstrated awareness of these errors but was inconsistently  able to self-correct. Overall, her language skills appear similar to that which was noted during the last treatment session with speech pathology; however, no family was present to confirm her proximity to baseline. Acute SLP will treatment will therefore be initiated to target aphasia.    SLP Assessment  SLP Recommendation/Assessment: Patient needs continued Speech Lanaguage Pathology Services SLP Visit Diagnosis: Aphasia (R47.01)    Follow Up Recommendations  Skilled Nursing facility    Frequency and Duration min 2x/week  2 weeks      SLP Evaluation Cognition  Overall Cognitive Status: Difficult to assess (due to aphasia) Arousal/Alertness: Awake/alert       Comprehension  Auditory Comprehension Overall Auditory Comprehension: Impaired at baseline Yes/No Questions: Impaired Basic Biographical Questions: 76-100% accurate Complex Questions: 0-24% accurate Commands: Impaired One Step Basic Commands:  (2/4) Two Step Basic Commands:  (0/3) Conversation: Simple Reading Comprehension Reading Status: Not tested    Expression Expression Primary Mode of Expression: Verbal Verbal Expression Overall Verbal Expression: Impaired at baseline Initiation: Impaired Automatic Speech: Name;Counting;Day of week Level of Generative/Spontaneous Verbalization: Phrase Repetition: Impaired Verbal Errors: Phonemic paraphasias Pragmatics: No impairment Written Expression Dominant Hand: Right   Oral / Motor  Oral Motor/Sensory Function Overall Oral Motor/Sensory Function: Mild impairment (Pt exhibited difficulty following commands) Motor Speech Overall Motor Speech: Appears within functional limits for tasks assessed Respiration: Within functional limits Phonation: Normal Resonance: Within functional limits Articulation: Within functional limitis Intelligibility: Intelligible Motor Speech Errors: Not applicable   [name], [place], [place] Office  number [phone] Pager [phone]                    Horton Marshall 04/17/2020, 11:58 AM

## 2020-04-17 NOTE — TOC Initial Note (Signed)
Transition of Care [facility]) - Initial/Assessment Note    Patient Details  Name: [name] MRN: [id number] Date of Birth: 17-Oct-1928  Transition of [facility]) CM/SW Contact:    Alexander Mt, LCSW Phone Number: 04/17/2020, 12:12 PM  Clinical Narrative:                 CSW spoke with pt son [name] via telephone. [phone]. Introduced self, role, reason for call. Pt from [employer]. She was most recently residing at the [facility] following a recent hospitalization. Plan would be for pt to return there when ready. Pt has been a long time resident in the [facility] community and they are familiar with her. Contacted Kelly in admissions and confirmed pt able to return when ready. Per Claiborne Billings pt can return today if cleared by neuro. CSW has paged MD with this update.  Expected Discharge Plan: [place] Barriers to Discharge: Continued Medical Work up   Patient Goals and CMS Choice Patient states their goals for this hospitalization and ongoing recovery are:: return to [facility] at [facility] when ready CMS Medicare.gov Compare Post Acute Care list provided to:: Patient Represenative (must comment) (pt son) Choice offered to / list presented to : Adult Children  Expected Discharge Plan and Services Expected Discharge Plan: [place] In-house Referral: Clinical Social Work Discharge Planning Services: CM Consult Post Acute Care Choice: Resumption of Svcs/PTA Provider, [place] Living arrangements for the past 2 months: [place], [place]  Prior Living Arrangements/Services Living arrangements for the past 2 months: [place], [place] Lives with:: Facility Resident Patient language and need for interpreter reviewed:: Yes (no needs) Do you feel safe going back to the place where you live?: Yes      Need for Family Participation in Patient Care: Yes  (Comment) (assistance as needed) Care giver support system in place?: Yes (comment) (facility staff; pt son involved) Current home services: DME Criminal Activity/Legal Involvement Pertinent to Current Situation/Hospitalization: No - Comment as needed  Permission Sought/Granted Permission sought to share information with : Family Supports Permission granted to share information with : No (fluctuating orientation)  Share Information with NAME: [name]  Permission granted to share info w AGENCY: [place] granted to share info w Relationship: son  Permission granted to share info w Contact Information: [phone]  Emotional Assessment Appearance:: Other (Comment Required (telephonic assessment w/ pt son) Attitude/Demeanor/Rapport: Other (comment) (telephonic assessment w/ pt son) Affect (typically observed): Other (comment) (telephonic assessment w/ pt son) Orientation: : Oriented to Self, Oriented to Place, Fluctuating Orientation (Suspected and/or reported Sundowners) Alcohol / Substance Use: Not Applicable Psych Involvement: No (comment)  Admission diagnosis:  Cerebrovascular accident (CVA), unspecified mechanism ([place]) [I63.9] Extension of stroke [facility]) [I63.9] Patient Active Problem List   Diagnosis Date Noted  . Extension of stroke ([place]) 04/16/2020  . Dyslipidemia   . Stage 3a chronic kidney disease   . Demand ischemia ([place])   . Upper back pain 04/08/2020  . Thrombocytopenia ([place]) 04/08/2020  . Acute CVA (cerebrovascular accident) ([place]) 04/07/2020  . TIA (transient ischemic attack) 03/09/2019  . Pressure injury of skin 05/21/2018  . Incarcerated incisional hernia - reduced 05/19/2018  . SBO (small bowel obstruction) ([place]) 05/18/2018  . Bilateral primary osteoarthritis of knee 07/22/2017  . Hyperlipidemia 05/20/2014  . Hypotension 08/02/2013  . UTI (lower urinary tract infection) 08/02/2013  . Acute renal failure ([place]) 08/02/2013  . ARF (acute renal  failure) ([place]) 08/02/2013  . Calculus of gallbladder with acute cholecystitis, without mention of obstruction 07/10/2013  . Acute cholecystitis 06/17/2013  . Chest pain 06/17/2013  . Pulmonary infiltrate in left lung on chest x-ray 06/17/2013  . Hypertension   . CAD (coronary artery disease)    PCP:  Lajean Manes, MD Pharmacy:   [facility] [place], [place] - [address] [place] [address] Phone: [phone] Fax: [phone]  [facility]) - [place], [address] [place] [address] Phone: [phone] Fax: [phone]  Readmission Risk Interventions No flowsheet data found.

## 2020-04-17 NOTE — Progress Notes (Addendum)
STROKE TEAM PROGRESS NOTE   INTERVAL HISTORY No family at bedside. Patient is extremely hard of hearing at baseline.  Still displaying some expressive aphasia. I personally reviewed history of presenting illness, electronic medical records and imaging films in PACS.  She has a prior history of recent left MCA embolic branch infarct with aphasia 10 days ago and now she headache presented with worsening symptoms and MRI scan shows extension of the previous stroke with trace hemorrhagic transformation.  She was supposed to undergo 30-day outpatient cardiac monitoring but this has not yet happened Vitals:   04/17/20 0330 04/17/20 0530 04/17/20 0815 04/17/20 1158  BP: (!) 171/58 (!) 193/83 (!) 128/43 (!) 133/51  Pulse: 62 63 62 (!) 55  Resp:   18 18  Temp: 98.8 F (37.1 C) 97.8 F (36.6 C) 98.3 F (36.8 C) 98.2 F (36.8 C)  TempSrc: Oral Oral Oral Oral  SpO2: 97% 95% 97% 94%  Weight:      Height:       CBC:  Recent Labs  Lab 04/11/20 0334 04/16/20 1302  WBC 7.5 5.1  NEUTROABS 4.9 3.0  HGB 13.9 13.8  HCT 42.3 43.2  MCV 95.3 99.5  PLT 197 559   Basic Metabolic Panel:  Recent Labs  Lab 04/16/20 1302  NA 139  K 4.6  CL 103  CO2 27  GLUCOSE 105*  BUN 27*  CREATININE 0.83  CALCIUM 9.4   Lipid Panel:  Recent Labs  Lab 04/17/20 0422  CHOL 196  TRIG 108  HDL 42  CHOLHDL 4.7  VLDL 22  LDLCALC 132*   HgbA1c:  Recent Labs  Lab 04/17/20 0422  HGBA1C 6.3*   IMAGING past 24 hours CT HEAD WO CONTRAST  Result Date: 04/17/2020 CLINICAL DATA:  Stroke follow-up.  Headache at the vertex. EXAM: CT HEAD WITHOUT CONTRAST TECHNIQUE: Contiguous axial images were obtained from the base of the skull through the vertex without intravenous contrast. COMPARISON:  yesterday FINDINGS: Brain: Left MCA territory infarct at the inferior division, mainly along the posterior and lateral temporal lobe. The cortex is essentially isodense in the setting of petechial hemorrhage by MRI. No hematoma  or progression. No hydrocephalus or collection. Remote lacunar infarct at the medial left thalamus. Age normal brain volume Vascular: No hyperdense vessel or unexpected calcification. Skull: Normal. Negative for fracture or focal lesion. Sinuses/Orbits: Bilateral cataract resection IMPRESSION: 1. Stable compared to yesterday. 2. Left MCA branch infarct at the temporal lobe with petechial hemorrhage. Electronically Signed   By: Monte Fantasia M.D.   On: 04/17/2020 09:19   MR ANGIO HEAD WO CONTRAST  Result Date: 04/16/2020 CLINICAL DATA:  86 year old female with headache and dizziness. Recent aphasia and recent MRI 04/07/2020 demonstrating a small area of left posterior temporal lobe ischemia. No tPA given at that time. Progression on noncontrast head CT today. EXAM: MRI HEAD WITHOUT CONTRAST MRA HEAD WITHOUT CONTRAST TECHNIQUE: Multiplanar, multiecho pulse sequences of the brain and surrounding structures were obtained without intravenous contrast. Angiographic images of the head were obtained using MRA technique without contrast. COMPARISON:  Head CT earlier today. Brain MRI 11/03/2019. Intracranial MRA 03/10/2019. FINDINGS: MRI HEAD FINDINGS Brain: Nearly 6 cm area now of heterogeneous restricted diffusion and susceptibility due to parenchymal blood products in the posterior left temporal and lateral left occipital lobes (series 4, image 25 and series 7, image 12). Associated cytotoxic edema. No significant mass effect. No other restricted diffusion. No midline shift, mass effect, evidence of mass lesion, ventriculomegaly, extra-axial collection or  other acute intracranial hemorrhage. Cervicomedullary junction and pituitary are within normal limits. Stable chronic lacunar infarct in the medial left thalamus. Possible tiny chronic infarct in the right cerebellum on series 9, image 6. No cortical encephalomalacia identified. Stable gray and white matter signal elsewhere. Vascular: Major intracranial vascular flow  voids are stable. Skull and upper cervical spine: Negative for age visible cervical spine. Stable bone marrow signal. Sinuses/Orbits: Stable, negative. Other: Mastoids remain clear. Visible internal auditory structures appear normal. MRA HEAD FINDINGS Antegrade flow in the posterior circulation appears stable since the 2020 MRA. Dominant right vertebral artery. Both PICA appear to remain patent. No distal vertebral stenosis. Mildly fenestrated vertebrobasilar junction suspected (normal variant). No basilar stenosis. Patent SCA origins. Normal left PCA origin. Fetal type right PCA again noted. There is a left posterior communicating artery. Bilateral PCA branches are stable and within normal limits. Antegrade flow in both ICA siphons appears stable since last year. Ophthalmic and posterior communicating artery origins are within normal limits. No siphon stenosis identified. Patent MCA and ACA origins. Anterior communicating artery and visible ACA branches are within normal limits. Right MCA M1 is tortuous. Right MCA bifurcation and visible right MCA branches appear stable and within normal limits. Left MCA M1 segment and left MCA trifurcation appears stable and within normal limits. No left MCA branch occlusion can be identified. IMPRESSION: 1. Progressed posterior left MCA ischemia since 04/07/2020 with associated Petechial Hemorrhage. But no malignant hemorrhagic transformation or mass effect. 2. Intracranial MRA appears stable since last year and is negative for large vessel occlusion. 3. No other areas of recent ischemia. Chronic lacunar infarct of the left thalamus. Electronically Signed   By: Genevie Ann M.D.   On: 04/16/2020 17:13   MR BRAIN WO CONTRAST  Result Date: 04/16/2020 CLINICAL DATA:  86 year old female with headache and dizziness. Recent aphasia and recent MRI 04/07/2020 demonstrating a small area of left posterior temporal lobe ischemia. No tPA given at that time. Progression on noncontrast head CT  today. EXAM: MRI HEAD WITHOUT CONTRAST MRA HEAD WITHOUT CONTRAST TECHNIQUE: Multiplanar, multiecho pulse sequences of the brain and surrounding structures were obtained without intravenous contrast. Angiographic images of the head were obtained using MRA technique without contrast. COMPARISON:  Head CT earlier today. Brain MRI 11/03/2019. Intracranial MRA 03/10/2019. FINDINGS: MRI HEAD FINDINGS Brain: Nearly 6 cm area now of heterogeneous restricted diffusion and susceptibility due to parenchymal blood products in the posterior left temporal and lateral left occipital lobes (series 4, image 25 and series 7, image 12). Associated cytotoxic edema. No significant mass effect. No other restricted diffusion. No midline shift, mass effect, evidence of mass lesion, ventriculomegaly, extra-axial collection or other acute intracranial hemorrhage. Cervicomedullary junction and pituitary are within normal limits. Stable chronic lacunar infarct in the medial left thalamus. Possible tiny chronic infarct in the right cerebellum on series 9, image 6. No cortical encephalomalacia identified. Stable gray and white matter signal elsewhere. Vascular: Major intracranial vascular flow voids are stable. Skull and upper cervical spine: Negative for age visible cervical spine. Stable bone marrow signal. Sinuses/Orbits: Stable, negative. Other: Mastoids remain clear. Visible internal auditory structures appear normal. MRA HEAD FINDINGS Antegrade flow in the posterior circulation appears stable since the 2020 MRA. Dominant right vertebral artery. Both PICA appear to remain patent. No distal vertebral stenosis. Mildly fenestrated vertebrobasilar junction suspected (normal variant). No basilar stenosis. Patent SCA origins. Normal left PCA origin. Fetal type right PCA again noted. There is a left posterior communicating artery. Bilateral PCA branches  are stable and within normal limits. Antegrade flow in both ICA siphons appears stable since  last year. Ophthalmic and posterior communicating artery origins are within normal limits. No siphon stenosis identified. Patent MCA and ACA origins. Anterior communicating artery and visible ACA branches are within normal limits. Right MCA M1 is tortuous. Right MCA bifurcation and visible right MCA branches appear stable and within normal limits. Left MCA M1 segment and left MCA trifurcation appears stable and within normal limits. No left MCA branch occlusion can be identified. IMPRESSION: 1. Progressed posterior left MCA ischemia since 04/07/2020 with associated Petechial Hemorrhage. But no malignant hemorrhagic transformation or mass effect. 2. Intracranial MRA appears stable since last year and is negative for large vessel occlusion. 3. No other areas of recent ischemia. Chronic lacunar infarct of the left thalamus. Electronically Signed   By: Genevie Ann M.D.   On: 04/16/2020 17:13    PHYSICAL EXAM Pleasant elderly Caucasian lady not in distress. . Afebrile. Head is nontraumatic. Neck is supple without bruit.    Cardiac exam no murmur or gallop. Lungs are clear to auscultation. Distal pulses are well felt. Neurological Exam ;  Awake  Alert expressive greater than receptive aphasia with word finding difficulties and nonfluent speech.  She has frequent paraphasic errors and word substitution.  She follows simple midline and one-step commands.  No dysarthria.[place] movements full without nystagmus.fundi were not visualized. Vision acuity and fields appear normal. Hearing is normal. Palatal movements are normal. Face symmetric. Tongue midline. Mild right upper extremity drift with weakness of right grip intrinsic hand muscles and orbits left over right upper extremity.  Mild weakness of right hip flexors.  Normal strength on the left side.  Normal sensation. Gait deferred.  ASSESSMENT/PLAN [name] is a 86 y.o. female with a history of recent M3 occlusion with resulting aphasia.  She was doing okay  at nursing home until she started complaining of dizziness and headache which prompted her to be brought back to the emergency department for further evaluation.  Here, a repeat MRI was obtained which shows significant increase in the size of her infarct, all in the region of the known M3 occlusion  Stroke: extension of previous CVA on 04/07/20  CT head Acute to subacute infarct within the left frontotemporal lobe. Territory of involvement is significantly greater than was presenton the previous MRI of 04/07/2020. There are a few tiny punctate areas of relative increased attenuation within the adjacent cortex which could reflect petechial microhemorrhage. No large/space occupying hemorrhage.   MRI  Progressed posterior left MCA ischemia since 04/07/2020 with associated Petechial Hemorrhage. But no malignant hemorrhagic transformation or mass effect.  MRA  No LVO  Carotid Doppler 04/08/20 RIGHT:  - There is no evidence of deep vein thrombosis in the lower extremity.    - A cystic structure is found in the popliteal fossa.  - In addition to Bakers cyst, patient has a large hypoechoic fluid  collection mid right thigh.    LEFT:  - There is no evidence of deep vein thrombosis in the lower extremity.    - A cystic structure is found in the popliteal fossa.  2D Echo 1. Left ventricular ejection fraction, by estimation, is 60 to 65%. The  left ventricle has normal function. The left ventricle has no regional  wall motion abnormalities. There is mild concentric left ventricular  hypertrophy. Left ventricular diastolic  parameters are consistent with Grade I diastolic dysfunction (impaired  relaxation). Elevated left ventricular end-diastolic pressure.  The average  left ventricular global longitudinal strain is -18.7 %. The global  longitudinal strain is normal.  2. Right ventricular systolic function is normal. The right ventricular  size is normal. There is normal pulmonary artery  systolic pressure.  3. Left atrial size was severely dilated.  4. The mitral valve is normal in structure. Trivial mitral valve  regurgitation. No evidence of mitral stenosis.  5. Tricuspid valve regurgitation is mild to moderate.  6. The aortic valve is tricuspid. Aortic valve regurgitation is not  visualized. No aortic stenosis is present.  7. The inferior vena cava is normal in size with <50% respiratory  variability, suggesting right atrial pressure of 8 mmHg.   LDL 132  HgbA1c 6.3  VTE prophylaxis - SCD's    Diet   Diet regular Room service appropriate? Yes with Assist; Fluid consistency: Thin     aspirin 81 mg daily and clopidogrel 75 mg daily prior to admission, now on aspirin 81 mg daily.  Would like to place patient on ASA and Brilinta for 30 days pending cost.  Therapy recommendations:  SNF  Disposition:  pending  Hypertension  Home meds:  labetalol  Stable . Permissive hypertension (OK if < 220/120) but gradually normalize in 5-7 days . Long-term BP goal normotensive  Hyperlipidemia  Home meds: none d/t documented statin allergy  LDL 132, goal < 70  Diabetes type II Controlled  Home meds:  None  HgbA1c 6.3, goal < 7.0  Other Stroke Risk Factors  Advanced age  Obesity, Body mass index is 27.3 kg/m., recommend weight loss, diet and exercise as appropriate   Hx stroke  Family hx stroke (father)  Coronary artery disease   Hospital day # 0  Summer Morale, [name], [name] [facility] [phone] I have personally obtained history,examined this patient, reviewed notes, independently viewed imaging studies, participated in medical decision making and plan of care.ROS completed by me personally and pertinent positives fully documented  I have made any additions or clarifications directly to the above note. Agree with note above.  She presented 10 days ago with aphasia secondary to left MCA branch infarct of embolic etiology and now  presented with worsening of symptoms and headaches and MRI shows extension of the infarct.  Etiology likely embolic presumably paroxysmal A. fib.  Patient was already on aspirin and Plavix and developed trace hemorrhagic transformation hence will discontinue that and change to aspirin and Brilinta in x 30 days and then aspirin alone instead.  Patient had previously refused loop recorder insertion and would recommend 30-day heart monitor upon discharge.  No family available at the bedside for discussion.  Discussed with Dr. Lonny Prude.  Greater than 50% time during this 35-minute visit was spent in counseling and coordination of care and discussion with care team and answering questions.  Antony Contras, MD Medical Director [facility] Pager: [phone] 04/17/2020 4:15 PM   To contact Stroke Continuity provider, please refer to [email or web address] After hours, contact General Neurology

## 2020-04-17 NOTE — TOC Transition Note (Signed)
Transition of Care [facility]) - CM/SW Discharge Note   Patient Details  Name: [name] MRN: [id number] Date of Birth: Jul 10, 1929  Transition of Care [facility]) CM/SW Contact:  Alexander Mt, LCSW Phone Number: 04/17/2020, 3:31 PM   Clinical Narrative:    Pt cleared for d.c, DNR signed on chart with PTAR papers. Pt son contacted and aware, pt has lost a shirt which nursing staff is aware of and will look for it. [facility] ready and aware.  PTAR called.    Final next level of care: [place] Barriers to Discharge: Continued Medical Work up   Patient Goals and CMS Choice Patient states their goals for this hospitalization and ongoing recovery are:: return to [facility] (SNF) at [facility] when ready CMS Medicare.gov Compare Post Acute Care list provided to:: Patient Represenative (must comment) (pt son) Choice offered to / list presented to : Adult Children  Discharge Placement   Existing PASRR number confirmed : 04/17/20          Patient chooses bed at: [facility] Patient to be transferred to facility by: [facility] Name of family member notified: pt son [name] Patient and family notified of of transfer: 04/17/20  Discharge Plan and Services In-house Referral: Clinical Social Work Discharge Planning Services: CM Consult Post Acute Care Choice: Resumption of Product/process development scientist, [place]           Readmission Risk Interventions No flowsheet data found.

## 2020-04-17 NOTE — Evaluation (Signed)
Physical Therapy Evaluation Patient Details Name: [name] MRN: [id number] DOB: 09-18-1928 Today's Date: 04/17/2020   History of Present Illness  Patient is a 86 y/o female with PMH of HTN, OA, hearing loss, cataract, CAD,  presenting with slurred speech. Admitted 8/23-27 for CVA with aphasia, dc to SNF--c/o dizziness and headache returned to ED 9/1. MRI brain shows significant extension of CVA in posterior L MCA and associated petechial hemorrhage.     Clinical Impression  Pt in bed upon arrival of PT, agreeable to evaluation at this time. Prior to current admission the pt was receiving rehab at [facility] following d/c on 8/27. The pt now presents with limitations in functional mobility, strength, activity tolerance, and stability due to above dx and recent CVA, and will continue to benefit from skilled PT to address these deficits. The pt was able to demo good independence with bed mobility, but requires significant assist to power up to standing, to stabilize in standing, and to take short lateral steps to complete a pivot transfer. The pt will benefit from continued skilled therapy acutely and following d/c to progress functional strength, power, and stability for transfers, as well as endurance for progressing ambulation and mobility.      Follow Up Recommendations SNF (return to SNF)    Equipment Recommendations  Other (comment) (defer to post acute)    Recommendations for Other Services       Precautions / Restrictions Precautions Precautions: Fall Precaution Comments: incontinent upon standing Restrictions Weight Bearing Restrictions: No      Mobility  Bed Mobility Overal bed mobility: Needs Assistance Bed Mobility: Supine to Sit;Sit to Supine     Supine to sit: Supervision Sit to supine: Min guard   General bed mobility comments: increased time and effort, HOB elevated and use of rail but only min guard for safety to return supine   Transfers Overall  transfer level: Needs assistance Equipment used: None Transfers: Sit to/from [facility] Sit to Stand: Mod assist;+2 physical assistance;+2 safety/equipment Stand pivot transfers: Min assist;+2 physical assistance;+2 safety/equipment       General transfer comment: modA of 2 to power up and steady (pt verbalizing fear of falling throughout mobility), min assist to pivot to/from [facility] with cueing for technique  Ambulation/Gait Ambulation/Gait assistance: Min assist;+2 physical assistance Gait Distance (Feet): 5 Feet Assistive device: 2 person hand held assist Gait Pattern/deviations: Step-to pattern;Shuffle;Trunk flexed;Decreased stride length   Gait velocity interpretation: <1.31 ft/sec, indicative of household ambulator General Gait Details: pt with short, lateral steps to [facility] and back. minA through HHA of 2 to steady (pt verbalizing fear of falling throughout mobility),   Modified Rankin (Stroke Patients Only) Modified Rankin (Stroke Patients Only) Pre-Morbid Rankin Score: Moderately severe disability Modified Rankin: Moderately severe disability     Balance Overall balance assessment: Needs assistance Sitting-balance support: Feet supported;No upper extremity supported Sitting balance-Leahy Scale: Fair     Standing balance support: Bilateral upper extremity supported;During functional activity Standing balance-Leahy Scale: Poor Standing balance comment: relies on BUE and external support                             Pertinent Vitals/Pain Pain Assessment: No/denies pain Pain Intervention(s): Limited activity within patient's tolerance;Monitored during session    Kyle expects to be discharged to:: [place]: Level entry     Home Layout: One level   Additional  Comments: prior to first admission (8/23-8/27) the pt was living alone at [facility], d/c to SNF at [facility].    Prior  Function Level of Independence: Needs assistance         Comments: patient recently dc'd to SNF for rehab since CVA (admission 8/23-27/21), prior to this admission was independent but has required assist for ADls/mobility since      Hand Dominance   Dominant Hand: Right    Extremity/Trunk Assessment   Upper Extremity Assessment Upper Extremity Assessment: Generalized weakness    Lower Extremity Assessment Lower Extremity Assessment: Generalized weakness (able to move L and R against gravity, MMT limited due to [facility] and intermittent command following difficulties.)    Cervical / Trunk Assessment Cervical / Trunk Assessment: Kyphotic  Communication   Communication: Expressive difficulties;HOH  Cognition Arousal/Alertness: Awake/alert Behavior During Therapy: WFL for tasks assessed/performed Overall Cognitive Status: Difficult to assess Area of Impairment: Attention;Following commands;Safety/judgement;Awareness;Problem solving                   Current Attention Level: Sustained   Following Commands: Follows one step commands consistently;Follows one step commands with increased time;Follows multi-step commands inconsistently Safety/Judgement: Decreased awareness of safety;Decreased awareness of deficits Awareness: Intellectual Problem Solving: Slow processing;Difficulty sequencing;Requires verbal cues;Decreased initiation General Comments: patient able to follow simple commands with increased time, limited by [facility] and aphasia; decreased awareness to situation but oriented to self and hospital, unable to verbalize need to urinate and pt incontinent during session              Assessment/Plan    PT Assessment Patient needs continued PT services  PT Problem List Decreased strength;Decreased balance;Decreased mobility;Decreased activity tolerance;Decreased knowledge of use of DME;Decreased knowledge of precautions       PT Treatment Interventions DME  instruction;Gait training;Therapeutic activities;Therapeutic exercise;Functional mobility training;Balance training;Patient/family education;Cognitive remediation    PT Goals (Current goals can be found in the Care Plan section)  Acute Rehab PT Goals Patient Stated Goal: to not fall PT Goal Formulation: With patient Time For Goal Achievement: 04/22/20 Potential to Achieve Goals: Good    Frequency Min 2X/week   Barriers to discharge        Co-evaluation PT/OT/SLP Co-Evaluation/Treatment: Yes Reason for Co-Treatment: For patient/therapist safety;To address functional/ADL transfers PT goals addressed during session: Mobility/safety with mobility;Balance OT goals addressed during session: ADL's and self-care       AM-PAC PT "6 Clicks" Mobility  Outcome Measure Help needed turning from your back to your side while in a flat bed without using bedrails?: A Little Help needed moving from lying on your back to sitting on the side of a flat bed without using bedrails?: A Little Help needed moving to and from a bed to a chair (including a wheelchair)?: A Lot Help needed standing up from a chair using your arms (e.g., wheelchair or bedside chair)?: A Lot Help needed to walk in hospital room?: A Lot Help needed climbing 3-5 steps with a railing? : Total 6 Click Score: 13    End of Session Equipment Utilized During Treatment: Gait belt Activity Tolerance: Patient tolerated treatment well Patient left: in bed;with call bell/phone within reach Nurse Communication: Mobility status PT Visit Diagnosis: Unsteadiness on feet (R26.81);Muscle weakness (generalized) (M62.81);Difficulty in walking, not elsewhere classified (R26.2)    Time: 0623-7628 PT Time Calculation (min) (ACUTE ONLY): 23 min   Charges:   PT Evaluation $PT Eval Moderate Complexity: 1 Mod          Katie F,  PT, DPT   Acute Rehabilitation Department Pager #: [phone]  Otho Bellows 04/17/2020, 10:00 AM

## 2020-04-17 NOTE — [place] FL2 (Signed)
[place] MEDICAID FL2 LEVEL OF CARE SCREENING TOOL     IDENTIFICATION  Patient Name: [name] Birthdate: October 04, 1928 Sex: female Admission Date (Current Location): 04/16/2020  [facility] and [place] Number:  Herbalist and Address:  [facility], [place] [address], [place], [place] [address]      Provider Number: [id number]  Attending Physician Name and Address:  Mariel Aloe, MD  Relative Name and Phone Number:       Current Level of Care: Hospital Recommended Level of Care: [place] Prior Approval Number:    Date Approved/Denied:   PASRR Number: [id number]  Discharge Plan: SNF    Current Diagnoses: Patient Active Problem List   Diagnosis Date Noted  . Extension of stroke ([place]) 04/16/2020  . Dyslipidemia   . Stage 3a chronic kidney disease   . Demand ischemia ([place])   . Upper back pain 04/08/2020  . Thrombocytopenia ([place]) 04/08/2020  . Acute CVA (cerebrovascular accident) ([place]) 04/07/2020  . TIA (transient ischemic attack) 03/09/2019  . Pressure injury of skin 05/21/2018  . Incarcerated incisional hernia - reduced 05/19/2018  . SBO (small bowel obstruction) ([place]) 05/18/2018  . Bilateral primary osteoarthritis of knee 07/22/2017  . Hyperlipidemia 05/20/2014  . Hypotension 08/02/2013  . UTI (lower urinary tract infection) 08/02/2013  . Acute renal failure ([place]) 08/02/2013  . ARF (acute renal failure) ([place]) 08/02/2013  . Calculus of gallbladder with acute cholecystitis, without mention of obstruction 07/10/2013  . Acute cholecystitis 06/17/2013  . Chest pain 06/17/2013  . Pulmonary infiltrate in left lung on chest x-ray 06/17/2013  . Hypertension   . CAD (coronary artery disease)     Orientation RESPIRATION BLADDER Height & Weight     Self, Place  Normal Incontinent Weight: 154 lb 1.6 oz (69.9 kg) Height:  5\' 3"  (160 cm)  BEHAVIORAL SYMPTOMS/MOOD NEUROLOGICAL BOWEL NUTRITION STATUS      Continent Diet  (see discharge summary)  AMBULATORY STATUS COMMUNICATION OF NEEDS Skin   Extensive Assist Verbally Other (Comment) (generalized ecchymosis)                       Personal Care Assistance Level of Assistance  Bathing, Feeding, Dressing Bathing Assistance: Maximum assistance Feeding assistance: Independent Dressing Assistance: Maximum assistance     Functional Limitations Info  Hearing, Sight, Speech Sight Info: Adequate Hearing Info: Impaired Speech Info: Adequate    SPECIAL CARE FACTORS FREQUENCY  PT (By licensed PT), OT (By licensed OT)     PT Frequency: 5x week OT Frequency: 5x week     Speech Therapy Frequency: 3x week      Contractures Contractures Info: Not present    Additional Factors Info  Code Status, Allergies Code Status Info: DNR Allergies Info: Felodipine, Prednisone, Shrimp (Shellfish Allergy), Codeine, Crestor (Rosuvastatin), Feldene (Piroxicam), Lescol (Fluvastatin), Lipitor (Atorvastatin), Nexium (Esomeprazole), Pravachol (Pravastatin), Prilosec Otc (Omeprazole Magnesium), Statins, Welchol (Colesevelam), Zetia (Ezetimibe), Zithromax (Azithromycin), Zocor (Simvastatin), Omeprazole           Current Medications (04/17/2020):  This is the current hospital active medication list Current Facility-Administered Medications  Medication Dose Route Frequency Provider Last Rate Last Admin  . acetaminophen (TYLENOL) tablet 650 mg  650 mg Oral Q4H PRN Etta Quill, DO       Or  . acetaminophen (TYLENOL) 160 MG/5ML solution 650 mg  650 mg Per Tube Q4H PRN Etta Quill, DO       Or  . acetaminophen (TYLENOL)  suppository 650 mg  650 mg Rectal Q4H PRN Etta Quill, DO      . aspirin EC tablet 81 mg  81 mg Oral Daily Jennette Kettle M, DO   81 mg at 04/17/20 0900  . hydrALAZINE (APRESOLINE) injection 10-20 mg  10-20 mg Intravenous Q4H PRN Etta Quill, DO   10 mg at 04/17/20 0644  . labetalol (NORMODYNE) tablet 100 mg  100 mg Oral BID Jennette Kettle  M, DO   100 mg at 04/17/20 0900  . lisinopril (ZESTRIL) tablet 20 mg  20 mg Oral Daily Jennette Kettle M, DO   20 mg at 04/17/20 0900  . traZODone (DESYREL) tablet 25 mg  25 mg Oral QHS PRN Etta Quill, DO         Discharge Medications: Please see discharge summary for a list of discharge medications.  Relevant Imaging Results:  Relevant Lab Results:   Additional Information SS#[id number] [place], [place]

## 2020-04-17 NOTE — Discharge Summary (Signed)
Physician Discharge Summary  ELEKTRA WARTMAN OMV:[id number] DOB: 10/11/1928 DOA: 04/16/2020  PCP: Lajean Manes, MD  Admit date: 04/16/2020 Discharge date: 04/17/2020  Admitted From: SNF Disposition: SNF  Recommendations for Outpatient Follow-up:  1. Holter monitor needs to be arranged as previously recommended 2. Brilinta x30 days only 3. Speech therapy services for aphasia 4. Please follow up on the following pending results: None   Discharge Condition: Stable CODE STATUS: DNR Diet recommendation: Heart healthy diet  Regular;Thin liquid  Liquid Administration via: Cup;Straw Medication Administration: Whole meds with liquid Supervision: Patient able to self feed Compensations: Minimize environmental distractions;Slow rate;Small sips/bites;Follow solids with liquid Postural Changes: Seated upright at 90 degrees    Brief/Interim Summary:  Admission HPI written by Etta Quill, DO   Chief Complaint: Headache  HPI: TYTEANNA OST is a 86 y.o. female with medical history significant of HLD, HTN, dCHF.  Pt just admitted 8/23-8/27 for stroke with aphasia.  She was doing okay at SNF until she started complaining of dizziness and headache which prompted her being brought back to ED today.   ED Course: In the ED headache is improved with fentanyl.  BP is running 190/70s.  MRI brain shows significant extension of previously imaged stroke during last admission   Hospital course:  Stroke with extension Patient with aphasia which is unchanged from initial finding of stroke on previous admission. Initial CT head significant for extension of previously known left frontotemporal lobe stroke with petechial hemorrhages. MRI confirmed progression of left MCA territory ischemia and petechial hemorrhages. Neurology/stroke team consulted for management. Repeat CT scan on 9/2 significant for stable appearance. Neurology recommending to continue Aspirin 81 mg daily, discontinue  Plavix and start Brilinta 90 mg BID for a course of 30 days. PT/OT recommending SNF and speech therapy recommending continued speech therapy on discharge for aphasia.  Essential hypertension Not controlled initially. Patient continued on home labetalol and lisinopril with some improvement prior to discharge.  Headache Left temporal, in the area of stroke. Discussed with neurology and likely related to edema from stroke. No significant hemorrhage seen on imaging. Treat with Tylenol as needed.  Hyperlipidemia LDL of 132. Patient is not on a statin secondary to intolerance. She is on omega-3 fatty acids which will continue.  Anxiety Continue Xanax prn   Discharge Diagnoses:  Principal Problem:   Extension of stroke [facility]) Active Problems:   Hypertension   Hyperlipidemia    Discharge Instructions   Allergies as of 04/17/2020      Reactions   Felodipine Anaphylaxis, Other (See Comments)   Reaction: unknown possible nausea or rash   Prednisone Other (See Comments)   Unknown reaction   Shrimp [shellfish Allergy] Nausea And Vomiting   Codeine    Crestor [rosuvastatin]    Feldene [piroxicam]    Lescol [fluvastatin]    Lipitor [atorvastatin]    Nexium [esomeprazole]    Pravachol [pravastatin]    Prilosec Otc [omeprazole Magnesium]    Statins Other (See Comments)   Muscle/leg pain.   Welchol [colesevelam]    Zetia [ezetimibe]    Zithromax [azithromycin]    Zocor [simvastatin]    Omeprazole Other (See Comments)   Reaction: unknown possible nausea or rash      Medication List    STOP taking these medications   clopidogrel 75 MG tablet Commonly known as: PLAVIX     TAKE these medications   acetaminophen 325 MG tablet Commonly known as: TYLENOL Take 2 tablets (650 mg total) by mouth  every 6 (six) hours as needed for headache.   aspirin 81 MG EC tablet Take 1 tablet (81 mg total) by mouth daily for 20 days. Swallow whole.   FISH OIL PO Take 1 capsule by mouth daily.    labetalol 100 MG tablet Commonly known as: NORMODYNE Take 1 tablet (100 mg total) by mouth 2 (two) times daily.   lisinopril 20 MG tablet Commonly known as: ZESTRIL Take 1 tablet (20 mg total) by mouth daily.   PRESERVISION AREDS PO Take 1 capsule by mouth daily.   ticagrelor 90 MG Tabs tablet Commonly known as: Brilinta Take 1 tablet (90 mg total) by mouth 2 (two) times daily. Take for 30 days.   traZODone 50 MG tablet Commonly known as: DESYREL Take 25 mg by mouth at bedtime as needed for sleep.   Xanax 0.25 MG tablet Generic drug: ALPRAZolam Take 1 tablet (0.25 mg total) by mouth every 8 (eight) hours as needed for anxiety.       Allergies  Allergen Reactions  . Felodipine Anaphylaxis and Other (See Comments)    Reaction: unknown possible nausea or rash  . Prednisone Other (See Comments)    Unknown reaction  . Shrimp [Shellfish Allergy] Nausea And Vomiting  . Codeine   . Crestor [Rosuvastatin]   . Feldene [Piroxicam]   . Lescol [Fluvastatin]   . Lipitor [Atorvastatin]   . Nexium [Esomeprazole]   . Pravachol [Pravastatin]   . Prilosec Otc [Omeprazole Magnesium]   . Statins Other (See Comments)    Muscle/leg pain.  Earnestine Mealing [Colesevelam]   . Zetia [Ezetimibe]   . Zithromax [Azithromycin]   . Zocor [Simvastatin]   . Omeprazole Other (See Comments)    Reaction: unknown possible nausea or rash    Consultations:  Neurology/Stroke   Procedures/Studies: CT ANGIO HEAD W OR WO CONTRAST  Result Date: 04/08/2020 CLINICAL DATA:  Follow-up examination for acute stroke. EXAM: CT ANGIOGRAPHY HEAD AND NECK TECHNIQUE: Multidetector CT imaging of the head and neck was performed using the standard protocol during bolus administration of intravenous contrast. Multiplanar CT image reconstructions and MIPs were obtained to evaluate the vascular anatomy. Carotid stenosis measurements (when applicable) are obtained utilizing NASCET criteria, using the distal internal carotid  diameter as the denominator. CONTRAST:  43mL OMNIPAQUE IOHEXOL 350 MG/ML SOLN COMPARISON:  Prior studies from 04/07/2020 FINDINGS: CTA NECK FINDINGS Aortic arch: Visualized aortic arch of normal caliber with normal 3 vessel morphology. Mild atheromatous change about the arch and origin of the great vessels without hemodynamically significant stenosis. Right carotid system: Right CCA widely patent from its origin to the bifurcation without stenosis. Mild atheromatous change about the right bifurcation without significant stenosis. Right ICA mildly tortuous and medialized into the retropharyngeal space but is widely patent to the skull base without stenosis, dissection or occlusion. Left carotid system: Left CCA patent from its origin to the bifurcation without flow-limiting stenosis. Mild atheromatous change about the origin of the left ICA without significant stenosis. Left ICA mildly tortuous and medialized into the retropharyngeal space but is widely patent to the skull base without stenosis, dissection or occlusion. Vertebral arteries: Both vertebral arteries arise from the subclavian arteries. Right vertebral artery dominant. Vertebral arteries widely patent without stenosis, dissection or occlusion. Skeleton: No definite acute osseous abnormality. Bones are somewhat diffusely sclerotic in appearance without discrete lytic or blastic osseous lesion Other neck: No other acute soft tissue abnormality within the neck. Few scattered thyroid nodules noted, largest of which measures 7 mm on the  right. Findings felt to be of doubtful significance given size and patient age. No follow-up imaging recommended regarding these lesions. No other mass lesion or adenopathy. Upper chest: Mild scattered interlobular septal thickening noted within the visualized lungs, suggesting underlying mild pulmonary interstitial congestion. Mild irregular biapical pleuroparenchymal thickening/scarring noted. Visualized upper chest  demonstrates no other acute finding. Review of the MIP images confirms the above findings CTA HEAD FINDINGS Anterior circulation: Petrous segments patent bilaterally. Mild scattered atheromatous change within the cavernous/supraclinoid ICAs without hemodynamically significant stenosis. ICA termini well perfused. A1 segments patent bilaterally. Normal anterior communicating artery complex. Anterior cerebral arteries patent to their distal aspects without stenosis. No M1 stenosis or occlusion. Normal MCA bifurcations. On the left, there is a proximal left M3 occlusion, inferior division, in keeping with the previously identified small left MCA territory infarct (series 7, image 95). Otherwise, MCA branches well perfused and symmetric. Posterior circulation: Mild focal non stenotic plaque noted within the proximal right V4 segment. Vertebral arteries otherwise widely patent without stenosis. Both picas patent. Basilar patent to its distal aspect without stenosis. Superior cerebral arteries patent bilaterally. Left PCA supplied via the basilar. Fetal type origin of the right PCA. Both PCAs well perfused to their distal aspects without stenosis. Venous sinuses: Grossly patent allowing for timing the contrast bolus. Anatomic variants: Fetal type origin of the right PCA. No intracranial aneurysm. Review of the MIP images confirms the above findings IMPRESSION: 1. Acute proximal left M3 occlusion, inferior division, in keeping with the previously identified small left MCA territory infarct. 2. Mild atheromatous change about the carotid bifurcations and carotid siphons without hemodynamically significant or correctable stenosis. 3. Mild diffuse interlobular septal thickening within the visualized lungs, suggesting mild pulmonary interstitial congestion/edema. Electronically Signed   By: Jeannine Boga M.D.   On: 04/08/2020 01:57   DG Chest 2 View  Result Date: 04/08/2020 CLINICAL DATA:  86 year old female with  slurred speech. EXAM: CHEST - 2 VIEW COMPARISON:  Chest radiograph dated 03/09/2019. FINDINGS: There is cardiomegaly with mild vascular congestion. Left lung base density may represent atelectasis or infiltrate. The right lung is clear. No pleural effusion or pneumothorax. There is a moderate size hiatal hernia. No acute osseous pathology. IMPRESSION: 1. Cardiomegaly with mild vascular congestion. 2. Left lung base atelectasis versus infiltrate. 3. Moderate size hiatal hernia. Electronically Signed   By: Anner Crete M.D.   On: 04/08/2020 01:10   CT HEAD WO CONTRAST  Result Date: 04/17/2020 CLINICAL DATA:  Stroke follow-up.  Headache at the vertex. EXAM: CT HEAD WITHOUT CONTRAST TECHNIQUE: Contiguous axial images were obtained from the base of the skull through the vertex without intravenous contrast. COMPARISON:  yesterday FINDINGS: Brain: Left MCA territory infarct at the inferior division, mainly along the posterior and lateral temporal lobe. The cortex is essentially isodense in the setting of petechial hemorrhage by MRI. No hematoma or progression. No hydrocephalus or collection. Remote lacunar infarct at the medial left thalamus. Age normal brain volume Vascular: No hyperdense vessel or unexpected calcification. Skull: Normal. Negative for fracture or focal lesion. Sinuses/Orbits: Bilateral cataract resection IMPRESSION: 1. Stable compared to yesterday. 2. Left MCA branch infarct at the temporal lobe with petechial hemorrhage. Electronically Signed   By: Monte Fantasia M.D.   On: 04/17/2020 09:19   CT Head Wo Contrast  Result Date: 04/16/2020 CLINICAL DATA:  Headache, dizziness EXAM: CT HEAD WITHOUT CONTRAST TECHNIQUE: Contiguous axial images were obtained from the base of the skull through the vertex without intravenous contrast. COMPARISON:  04/07/2020 FINDINGS: Brain: Low-density changes centered at the gray-white matter junction within the left frontotemporal lobe compatible with a acute to  subacute infarct. Territory of involvement is significantly greater than was present on the previous MRI of 04/07/2020. There are a few tiny punctate areas of relative increased attenuation within the adjacent cortex which could reflect petechial microhemorrhage (series 3, image 16). No large/space occupying hemorrhage. Chronic lacunar infarct within the left thalamus. No hydrocephalus. No extra-axial collection/hemorrhage. Vascular: Atherosclerotic calcifications involving the large vessels of the skull base. No unexpected hyperdense vessel. Skull: Normal. Negative for fracture or focal lesion. Sinuses/Orbits: No acute finding. Other: None. IMPRESSION: Acute to subacute infarct within the left frontotemporal lobe. Territory of involvement is significantly greater than was present on the previous MRI of 04/07/2020. There are a few tiny punctate areas of relative increased attenuation within the adjacent cortex which could reflect petechial microhemorrhage. No large/space occupying hemorrhage. Critical Value/emergent results were called by telephone at the time of interpretation on 04/16/2020 at 12:17 pm to provider Veryl Speak , who verbally acknowledged these results. Electronically Signed   By: Davina Poke D.O.   On: 04/16/2020 12:19   CT HEAD WO CONTRAST  Result Date: 04/07/2020 CLINICAL DATA:  Acute neuro deficit.  Slurred speech EXAM: CT HEAD WITHOUT CONTRAST TECHNIQUE: Contiguous axial images were obtained from the base of the skull through the vertex without intravenous contrast. COMPARISON:  CT head 01/02/2020 FINDINGS: Brain: Mild cortical atrophy. Negative for hydrocephalus. Chronic infarct left thalamus unchanged. Mild chronic ischemic changes in the white matter. Negative for acute infarct, hemorrhage, mass Vascular: Negative for hyperdense vessel Skull: Negative Sinuses/Orbits: Paranasal sinuses clear. Bilateral cataract extraction. Other: None IMPRESSION: No acute abnormality no change from the  prior study. Electronically Signed   By: Franchot Gallo M.D.   On: 04/07/2020 20:30   CT ANGIO NECK W OR WO CONTRAST  Result Date: 04/08/2020 CLINICAL DATA:  Follow-up examination for acute stroke. EXAM: CT ANGIOGRAPHY HEAD AND NECK TECHNIQUE: Multidetector CT imaging of the head and neck was performed using the standard protocol during bolus administration of intravenous contrast. Multiplanar CT image reconstructions and MIPs were obtained to evaluate the vascular anatomy. Carotid stenosis measurements (when applicable) are obtained utilizing NASCET criteria, using the distal internal carotid diameter as the denominator. CONTRAST:  42mL OMNIPAQUE IOHEXOL 350 MG/ML SOLN COMPARISON:  Prior studies from 04/07/2020 FINDINGS: CTA NECK FINDINGS Aortic arch: Visualized aortic arch of normal caliber with normal 3 vessel morphology. Mild atheromatous change about the arch and origin of the great vessels without hemodynamically significant stenosis. Right carotid system: Right CCA widely patent from its origin to the bifurcation without stenosis. Mild atheromatous change about the right bifurcation without significant stenosis. Right ICA mildly tortuous and medialized into the retropharyngeal space but is widely patent to the skull base without stenosis, dissection or occlusion. Left carotid system: Left CCA patent from its origin to the bifurcation without flow-limiting stenosis. Mild atheromatous change about the origin of the left ICA without significant stenosis. Left ICA mildly tortuous and medialized into the retropharyngeal space but is widely patent to the skull base without stenosis, dissection or occlusion. Vertebral arteries: Both vertebral arteries arise from the subclavian arteries. Right vertebral artery dominant. Vertebral arteries widely patent without stenosis, dissection or occlusion. Skeleton: No definite acute osseous abnormality. Bones are somewhat diffusely sclerotic in appearance without discrete  lytic or blastic osseous lesion Other neck: No other acute soft tissue abnormality within the neck. Few scattered thyroid nodules noted, largest  of which measures 7 mm on the right. Findings felt to be of doubtful significance given size and patient age. No follow-up imaging recommended regarding these lesions. No other mass lesion or adenopathy. Upper chest: Mild scattered interlobular septal thickening noted within the visualized lungs, suggesting underlying mild pulmonary interstitial congestion. Mild irregular biapical pleuroparenchymal thickening/scarring noted. Visualized upper chest demonstrates no other acute finding. Review of the MIP images confirms the above findings CTA HEAD FINDINGS Anterior circulation: Petrous segments patent bilaterally. Mild scattered atheromatous change within the cavernous/supraclinoid ICAs without hemodynamically significant stenosis. ICA termini well perfused. A1 segments patent bilaterally. Normal anterior communicating artery complex. Anterior cerebral arteries patent to their distal aspects without stenosis. No M1 stenosis or occlusion. Normal MCA bifurcations. On the left, there is a proximal left M3 occlusion, inferior division, in keeping with the previously identified small left MCA territory infarct (series 7, image 95). Otherwise, MCA branches well perfused and symmetric. Posterior circulation: Mild focal non stenotic plaque noted within the proximal right V4 segment. Vertebral arteries otherwise widely patent without stenosis. Both picas patent. Basilar patent to its distal aspect without stenosis. Superior cerebral arteries patent bilaterally. Left PCA supplied via the basilar. Fetal type origin of the right PCA. Both PCAs well perfused to their distal aspects without stenosis. Venous sinuses: Grossly patent allowing for timing the contrast bolus. Anatomic variants: Fetal type origin of the right PCA. No intracranial aneurysm. Review of the MIP images confirms the  above findings IMPRESSION: 1. Acute proximal left M3 occlusion, inferior division, in keeping with the previously identified small left MCA territory infarct. 2. Mild atheromatous change about the carotid bifurcations and carotid siphons without hemodynamically significant or correctable stenosis. 3. Mild diffuse interlobular septal thickening within the visualized lungs, suggesting mild pulmonary interstitial congestion/edema. Electronically Signed   By: Jeannine Boga M.D.   On: 04/08/2020 01:57   MR ANGIO HEAD WO CONTRAST  Result Date: 04/16/2020 CLINICAL DATA:  86 year old female with headache and dizziness. Recent aphasia and recent MRI 04/07/2020 demonstrating a small area of left posterior temporal lobe ischemia. No tPA given at that time. Progression on noncontrast head CT today. EXAM: MRI HEAD WITHOUT CONTRAST MRA HEAD WITHOUT CONTRAST TECHNIQUE: Multiplanar, multiecho pulse sequences of the brain and surrounding structures were obtained without intravenous contrast. Angiographic images of the head were obtained using MRA technique without contrast. COMPARISON:  Head CT earlier today. Brain MRI 11/03/2019. Intracranial MRA 03/10/2019. FINDINGS: MRI HEAD FINDINGS Brain: Nearly 6 cm area now of heterogeneous restricted diffusion and susceptibility due to parenchymal blood products in the posterior left temporal and lateral left occipital lobes (series 4, image 25 and series 7, image 12). Associated cytotoxic edema. No significant mass effect. No other restricted diffusion. No midline shift, mass effect, evidence of mass lesion, ventriculomegaly, extra-axial collection or other acute intracranial hemorrhage. Cervicomedullary junction and pituitary are within normal limits. Stable chronic lacunar infarct in the medial left thalamus. Possible tiny chronic infarct in the right cerebellum on series 9, image 6. No cortical encephalomalacia identified. Stable gray and white matter signal elsewhere.  Vascular: Major intracranial vascular flow voids are stable. Skull and upper cervical spine: Negative for age visible cervical spine. Stable bone marrow signal. Sinuses/Orbits: Stable, negative. Other: Mastoids remain clear. Visible internal auditory structures appear normal. MRA HEAD FINDINGS Antegrade flow in the posterior circulation appears stable since the 2020 MRA. Dominant right vertebral artery. Both PICA appear to remain patent. No distal vertebral stenosis. Mildly fenestrated vertebrobasilar junction suspected (normal variant). No basilar stenosis.  Patent SCA origins. Normal left PCA origin. Fetal type right PCA again noted. There is a left posterior communicating artery. Bilateral PCA branches are stable and within normal limits. Antegrade flow in both ICA siphons appears stable since last year. Ophthalmic and posterior communicating artery origins are within normal limits. No siphon stenosis identified. Patent MCA and ACA origins. Anterior communicating artery and visible ACA branches are within normal limits. Right MCA M1 is tortuous. Right MCA bifurcation and visible right MCA branches appear stable and within normal limits. Left MCA M1 segment and left MCA trifurcation appears stable and within normal limits. No left MCA branch occlusion can be identified. IMPRESSION: 1. Progressed posterior left MCA ischemia since 04/07/2020 with associated Petechial Hemorrhage. But no malignant hemorrhagic transformation or mass effect. 2. Intracranial MRA appears stable since last year and is negative for large vessel occlusion. 3. No other areas of recent ischemia. Chronic lacunar infarct of the left thalamus. Electronically Signed   By: Genevie Ann M.D.   On: 04/16/2020 17:13   MR BRAIN WO CONTRAST  Result Date: 04/16/2020 CLINICAL DATA:  86 year old female with headache and dizziness. Recent aphasia and recent MRI 04/07/2020 demonstrating a small area of left posterior temporal lobe ischemia. No tPA given at that  time. Progression on noncontrast head CT today. EXAM: MRI HEAD WITHOUT CONTRAST MRA HEAD WITHOUT CONTRAST TECHNIQUE: Multiplanar, multiecho pulse sequences of the brain and surrounding structures were obtained without intravenous contrast. Angiographic images of the head were obtained using MRA technique without contrast. COMPARISON:  Head CT earlier today. Brain MRI 11/03/2019. Intracranial MRA 03/10/2019. FINDINGS: MRI HEAD FINDINGS Brain: Nearly 6 cm area now of heterogeneous restricted diffusion and susceptibility due to parenchymal blood products in the posterior left temporal and lateral left occipital lobes (series 4, image 25 and series 7, image 12). Associated cytotoxic edema. No significant mass effect. No other restricted diffusion. No midline shift, mass effect, evidence of mass lesion, ventriculomegaly, extra-axial collection or other acute intracranial hemorrhage. Cervicomedullary junction and pituitary are within normal limits. Stable chronic lacunar infarct in the medial left thalamus. Possible tiny chronic infarct in the right cerebellum on series 9, image 6. No cortical encephalomalacia identified. Stable gray and white matter signal elsewhere. Vascular: Major intracranial vascular flow voids are stable. Skull and upper cervical spine: Negative for age visible cervical spine. Stable bone marrow signal. Sinuses/Orbits: Stable, negative. Other: Mastoids remain clear. Visible internal auditory structures appear normal. MRA HEAD FINDINGS Antegrade flow in the posterior circulation appears stable since the 2020 MRA. Dominant right vertebral artery. Both PICA appear to remain patent. No distal vertebral stenosis. Mildly fenestrated vertebrobasilar junction suspected (normal variant). No basilar stenosis. Patent SCA origins. Normal left PCA origin. Fetal type right PCA again noted. There is a left posterior communicating artery. Bilateral PCA branches are stable and within normal limits. Antegrade flow in  both ICA siphons appears stable since last year. Ophthalmic and posterior communicating artery origins are within normal limits. No siphon stenosis identified. Patent MCA and ACA origins. Anterior communicating artery and visible ACA branches are within normal limits. Right MCA M1 is tortuous. Right MCA bifurcation and visible right MCA branches appear stable and within normal limits. Left MCA M1 segment and left MCA trifurcation appears stable and within normal limits. No left MCA branch occlusion can be identified. IMPRESSION: 1. Progressed posterior left MCA ischemia since 04/07/2020 with associated Petechial Hemorrhage. But no malignant hemorrhagic transformation or mass effect. 2. Intracranial MRA appears stable since last year and is negative  for large vessel occlusion. 3. No other areas of recent ischemia. Chronic lacunar infarct of the left thalamus. Electronically Signed   By: Genevie Ann M.D.   On: 04/16/2020 17:13   MR BRAIN WO CONTRAST  Result Date: 04/07/2020 CLINICAL DATA:  Initial evaluation for neuro deficit, stroke suspected. EXAM: MRI HEAD WITHOUT CONTRAST TECHNIQUE: Multiplanar, multiecho pulse sequences of the brain and surrounding structures were obtained without intravenous contrast. COMPARISON:  Prior CT from earlier the same day. FINDINGS: Brain: Generalized age-related cerebral atrophy. Minimal T2/FLAIR hyperintensity within the periventricular deep white matter both cerebral hemispheres most consistent with chronic small vessel ischemic disease, minor in appearance, and felt to be within normal limits for age. Small remote lacunar infarct noted at the left thalamus. 6 mm focus of diffusion abnormality involving the cortical gray matter of the inferior left frontal lobe suspicious for a small acute ischemic infarct (series 5, image 76). No associated hemorrhage or mass effect. No other diffusion abnormality to suggest acute or subacute ischemia. Gray-white matter differentiation otherwise  maintained. No evidence for acute or chronic intracranial hemorrhage. No mass lesion, midline shift or mass effect. No hydrocephalus or extra-axial fluid collection. Pituitary gland suprasellar region normal. Midline structures intact. Vascular: Major intracranial vascular flow voids are maintained. Skull and upper cervical spine: Craniocervical junction within normal limits. Bone marrow signal intensity normal. No scalp soft tissue abnormality. Sinuses/Orbits: Patient status post bilateral ocular lens replacement. Paranasal sinuses are largely clear. No significant mastoid effusion. Inner ear structures grossly normal. Other: None. IMPRESSION: 1. 6 mm acute ischemic nonhemorrhagic cortical infarct involving the inferior left frontal lobe. 2. No other acute intracranial abnormality. 3. Underlying age-related cerebral atrophy, with remote left thalamic lacunar infarct. Electronically Signed   By: Jeannine Boga M.D.   On: 04/07/2020 23:30   ECHOCARDIOGRAM COMPLETE  Result Date: 04/08/2020    ECHOCARDIOGRAM REPORT   Patient Name:   WRENLEY SAYED Date of Exam: 04/08/2020 Medical Rec #:  [id number]      Height:       63.0 in Accession #:    [id number]     Weight:       141.3 lb Date of Birth:  27-May-1929      BSA:          1.668 m Patient Age:    17 years       BP:           163/65 mmHg Patient Gender: F              HR:           56 bpm. Exam Location:  Inpatient Procedure: 2D Echo, 3D Echo and Strain Analysis Indications:    Stroke 434.91 / I163.9  History:        Patient has prior history of Echocardiogram examinations, most                 recent 03/10/2019. CAD; Risk Factors:Hypertension and                 Dyslipidemia.  Sonographer:    Darlina Sicilian RDCS Referring Phys: [id number] [place]  1. Left ventricular ejection fraction, by estimation, is 60 to 65%. The left ventricle has normal function. The left ventricle has no regional wall motion abnormalities. There is mild concentric  left ventricular hypertrophy. Left ventricular diastolic parameters are consistent with Grade I diastolic dysfunction (impaired relaxation). Elevated left ventricular end-diastolic pressure. The average left ventricular global longitudinal strain is -  18.7 %. The global longitudinal strain is normal.  2. Right ventricular systolic function is normal. The right ventricular size is normal. There is normal pulmonary artery systolic pressure.  3. Left atrial size was severely dilated.  4. The mitral valve is normal in structure. Trivial mitral valve regurgitation. No evidence of mitral stenosis.  5. Tricuspid valve regurgitation is mild to moderate.  6. The aortic valve is tricuspid. Aortic valve regurgitation is not visualized. No aortic stenosis is present.  7. The inferior vena cava is normal in size with <50% respiratory variability, suggesting right atrial pressure of 8 mmHg. FINDINGS  Left Ventricle: Left ventricular ejection fraction, by estimation, is 60 to 65%. The left ventricle has normal function. The left ventricle has no regional wall motion abnormalities. The average left ventricular global longitudinal strain is -18.7 %. The global longitudinal strain is normal. The left ventricular internal cavity size was normal in size. There is mild concentric left ventricular hypertrophy. Left ventricular diastolic parameters are consistent with Grade I diastolic dysfunction (impaired relaxation). Elevated left ventricular end-diastolic pressure. Right Ventricle: The right ventricular size is normal. No increase in right ventricular wall thickness. Right ventricular systolic function is normal. There is normal pulmonary artery systolic pressure. The tricuspid regurgitant velocity is 2.50 m/s, and  with an assumed right atrial pressure of 8 mmHg, the estimated right ventricular systolic pressure is 81.4 mmHg. Left Atrium: Left atrial size was severely dilated. Right Atrium: Right atrial size was normal in size.  Pericardium: There is no evidence of pericardial effusion. Mitral Valve: The mitral valve is normal in structure. Normal mobility of the mitral valve leaflets. Moderate mitral annular calcification. Trivial mitral valve regurgitation. No evidence of mitral valve stenosis. Tricuspid Valve: The tricuspid valve is normal in structure. Tricuspid valve regurgitation is mild to moderate. No evidence of tricuspid stenosis. Aortic Valve: The aortic valve is tricuspid. . There is moderate thickening and moderate calcification of the aortic valve. Aortic valve regurgitation is not visualized. No aortic stenosis is present. There is moderate thickening of the aortic valve. There is moderate calcification of the aortic valve. Pulmonic Valve: The pulmonic valve was normal in structure. Pulmonic valve regurgitation is mild. No evidence of pulmonic stenosis. Aorta: The aortic root is normal in size and structure. Venous: The inferior vena cava is normal in size with less than 50% respiratory variability, suggesting right atrial pressure of 8 mmHg. IAS/Shunts: No atrial level shunt detected by color flow Doppler.  LEFT VENTRICLE PLAX 2D LVIDd:         3.30 cm  Diastology LVIDs:         1.90 cm  LV e' lateral:   4.68 cm/s LV PW:         1.20 cm  LV E/e' lateral: 18.1 LV IVS:        1.10 cm  LV e' medial:    5.66 cm/s LVOT diam:     1.80 cm  LV E/e' medial:  14.9 LV SV:         54 LV SV Index:   32       2D Longitudinal Strain LVOT Area:     2.54 cm 2D Strain GLS (A2C):   -18.1 %                         2D Strain GLS (A3C):   -19.2 %  2D Strain GLS (A4C):   -18.8 %                         2D Strain GLS Avg:     -18.7 %                          3D Volume EF:                         3D EF:        62 %                         LV EDV:       101 ml                         LV ESV:       38 ml                         LV SV:        63 ml RIGHT VENTRICLE TAPSE (M-mode): 2.8 cm LEFT ATRIUM             Index       RIGHT  ATRIUM           Index LA diam:        3.60 cm 2.16 cm/m  RA Area:     18.50 cm LA Vol (A2C):   65.8 ml 39.44 ml/m RA Volume:   47.50 ml  28.47 ml/m LA Vol (A4C):   66.1 ml 39.62 ml/m LA Biplane Vol: 66.9 ml 40.10 ml/m  AORTIC VALVE LVOT Vmax:   117.00 cm/s LVOT Vmean:  78.300 cm/s LVOT VTI:    0.211 m  AORTA Ao Root diam: 2.80 cm Ao Asc diam:  3.20 cm MITRAL VALVE                TRICUSPID VALVE MV Area (PHT): 2.60 cm     TR Peak grad:   25.0 mmHg MV Decel Time: 292 msec     TR Vmax:        250.00 cm/s MV E velocity: 84.50 cm/s MV A velocity: 128.00 cm/s  SHUNTS MV E/A ratio:  0.66         Systemic VTI:  0.21 m                             Systemic Diam: 1.80 cm Skeet Latch MD Electronically signed by Skeet Latch MD Signature Date/Time: 04/08/2020/4:41:14 PM    Final    VAS [place] LOWER EXTREMITY VENOUS (DVT)  Result Date: 04/08/2020  Lower Venous DVTStudy Indications: Stroke.  Limitations: Pain. Performing Technologist: Antonieta Pert RDMS, RVT  Examination Guidelines: A complete evaluation includes B-mode imaging, spectral Doppler, color Doppler, and power Doppler as needed of all accessible portions of each vessel. Bilateral testing is considered an integral part of a complete examination. Limited examinations for reoccurring indications may be performed as noted. The reflux portion of the exam is performed with the patient in reverse Trendelenburg.  +---------+---------------+---------+-----------+----------+--------------+ RIGHT    CompressibilityPhasicitySpontaneityPropertiesThrombus Aging +---------+---------------+---------+-----------+----------+--------------+ CFV      Full           Yes      Yes                                 +---------+---------------+---------+-----------+----------+--------------+  SFJ      Full                                                        +---------+---------------+---------+-----------+----------+--------------+ FV Prox  Full                                                         +---------+---------------+---------+-----------+----------+--------------+ FV Mid   Full                                                        +---------+---------------+---------+-----------+----------+--------------+ FV DistalFull                                                        +---------+---------------+---------+-----------+----------+--------------+ PFV      Full                                                        +---------+---------------+---------+-----------+----------+--------------+ POP      Full           Yes      Yes                                 +---------+---------------+---------+-----------+----------+--------------+ PTV      Full                                                        +---------+---------------+---------+-----------+----------+--------------+ PERO     Full                                                        +---------+---------------+---------+-----------+----------+--------------+ GSV      Full                                                        +---------+---------------+---------+-----------+----------+--------------+ Hypoechoic fliud collection seen mid thigh aprox 6.4 x 3.1 x 1.7cm  +---------+---------------+---------+-----------+----------+--------------+ LEFT     CompressibilityPhasicitySpontaneityPropertiesThrombus Aging +---------+---------------+---------+-----------+----------+--------------+ CFV      Full           Yes      Yes                                 +---------+---------------+---------+-----------+----------+--------------+  SFJ      Full                                                        +---------+---------------+---------+-----------+----------+--------------+ FV Prox  Full                                                        +---------+---------------+---------+-----------+----------+--------------+ FV Mid    Full                                                        +---------+---------------+---------+-----------+----------+--------------+ FV DistalFull                                                        +---------+---------------+---------+-----------+----------+--------------+ PFV      Full                                                        +---------+---------------+---------+-----------+----------+--------------+ POP      Full           Yes      Yes                                 +---------+---------------+---------+-----------+----------+--------------+ PTV      Full                                                        +---------+---------------+---------+-----------+----------+--------------+ PERO     Full                                                        +---------+---------------+---------+-----------+----------+--------------+ GSV      Full                                                        +---------+---------------+---------+-----------+----------+--------------+     Summary: RIGHT: - There is no evidence of deep vein thrombosis in the lower extremity.  - A cystic structure is found in the popliteal fossa. - In addition to Bakers cyst, patient has a large hypoechoic fluid collection mid right thigh.  LEFT: -  There is no evidence of deep vein thrombosis in the lower extremity.  - A cystic structure is found in the popliteal fossa.  *See table(s) above for measurements and observations. Electronically signed by Harold Barban MD on 04/08/2020 at 5:37:50 PM.    Final      Subjective: Left temporal headache, otherwise no concerns today.  Discharge Exam: Vitals:   04/17/20 0815 04/17/20 1158  BP: (!) 128/43 (!) 133/51  Pulse: 62 (!) 55  Resp: 18 18  Temp: 98.3 F (36.8 C) 98.2 F (36.8 C)  SpO2: 97% 94%   Vitals:   04/17/20 0330 04/17/20 0530 04/17/20 0815 04/17/20 1158  BP: (!) 171/58 (!) 193/83 (!) 128/43 (!) 133/51  Pulse:  62 63 62 (!) 55  Resp:   18 18  Temp: 98.8 F (37.1 C) 97.8 F (36.6 C) 98.3 F (36.8 C) 98.2 F (36.8 C)  TempSrc: Oral Oral Oral Oral  SpO2: 97% 95% 97% 94%  Weight:      Height:        General: Pt is alert, awake, not in acute distress Cardiovascular: RRR, S1/S2 +, no rubs, no gallops Respiratory: CTA bilaterally, no wheezing, no rhonchi Abdominal: Soft, NT, ND, bowel sounds + Extremities: no edema, no cyanosis Neurology: aphasia, 4/5 strength of upper/lower extremity bilaterally.    The results of significant diagnostics from this hospitalization (including imaging, microbiology, ancillary and laboratory) are listed below for reference.     Microbiology: Recent Results (from the past 240 hour(s))  SARS Coronavirus 2 by RT PCR (hospital order, performed in [facility] hospital lab) Nasopharyngeal Nasopharyngeal Swab     Status: None   Collection Time: 04/07/20  8:09 PM   Specimen: Nasopharyngeal Swab  Result Value Ref Range Status   SARS Coronavirus 2 NEGATIVE NEGATIVE Final    Comment: (NOTE) SARS-CoV-2 target nucleic acids are NOT DETECTED.  The SARS-CoV-2 RNA is generally detectable in upper and lower respiratory specimens during the acute phase of infection. The lowest concentration of SARS-CoV-2 viral copies this assay can detect is 250 copies / mL. A negative result does not preclude SARS-CoV-2 infection and should not be used as the sole basis for treatment or other patient management decisions.  A negative result may occur with improper specimen collection / handling, submission of specimen other than nasopharyngeal swab, presence of viral mutation(s) within the areas targeted by this assay, and inadequate number of viral copies (<250 copies / mL). A negative result must be combined with clinical observations, patient history, and epidemiological information.  Fact Sheet for Patients:   StrictlyIdeas.no  Fact Sheet for Healthcare  Providers: BankingDealers.co.za  This test is not yet approved or  cleared by the [place] FDA and has been authorized for detection and/or diagnosis of SARS-CoV-2 by FDA under an Emergency Use Authorization (EUA).  This EUA will remain in effect (meaning this test can be used) for the duration of the COVID-19 declaration under Section 564(b)(1) of the Act, 21 U.S.C. section 360bbb-3(b)(1), unless the authorization is terminated or revoked sooner.  Performed at [facility], [place] [address], [place], [place] [address]   Culture, Urine     Status: Abnormal   Collection Time: 04/08/20  6:30 PM   Specimen: Urine, Random  Result Value Ref Range Status   Specimen Description URINE, RANDOM  Final   Special Requests NONE  Final   Culture (A)  Final    <10,000 COLONIES/mL INSIGNIFICANT GROWTH Performed at [facility], [place]  [address] [place], [place] [address]    Report Status 04/10/2020 FINAL  Final  SARS Coronavirus 2 by RT PCR (hospital order, performed in [facility] hospital lab) Nasopharyngeal Nasopharyngeal Swab     Status: None   Collection Time: 04/10/20  1:55 PM   Specimen: Nasopharyngeal Swab  Result Value Ref Range Status   SARS Coronavirus 2 NEGATIVE NEGATIVE Final    Comment: (NOTE) SARS-CoV-2 target nucleic acids are NOT DETECTED.  The SARS-CoV-2 RNA is generally detectable in upper and lower respiratory specimens during the acute phase of infection. The lowest concentration of SARS-CoV-2 viral copies this assay can detect is 250 copies / mL. A negative result does not preclude SARS-CoV-2 infection and should not be used as the sole basis for treatment or other patient management decisions.  A negative result may occur with improper specimen collection / handling, submission of specimen other than nasopharyngeal swab, presence of viral mutation(s) within the areas targeted by this assay, and inadequate number of viral copies (<250  copies / mL). A negative result must be combined with clinical observations, patient history, and epidemiological information.  Fact Sheet for Patients:   StrictlyIdeas.no  Fact Sheet for Healthcare Providers: BankingDealers.co.za  This test is not yet approved or  cleared by the [place] FDA and has been authorized for detection and/or diagnosis of SARS-CoV-2 by FDA under an Emergency Use Authorization (EUA).  This EUA will remain in effect (meaning this test can be used) for the duration of the COVID-19 declaration under Section 564(b)(1) of the Act, 21 U.S.C. section 360bbb-3(b)(1), unless the authorization is terminated or revoked sooner.  Performed at [facility], [place] [address] [place], [place] [address]      Labs: BNP (last 3 results) No results for input(s): BNP in the last 8760 hours. Basic Metabolic Panel: Recent Labs  Lab 04/16/20 1302  NA 139  K 4.6  CL 103  CO2 27  GLUCOSE 105*  BUN 27*  CREATININE 0.83  CALCIUM 9.4   Liver Function Tests: No results for input(s): AST, ALT, ALKPHOS, BILITOT, PROT, ALBUMIN in the last 168 hours. No results for input(s): LIPASE, AMYLASE in the last 168 hours. No results for input(s): AMMONIA in the last 168 hours. CBC: Recent Labs  Lab 04/11/20 0334 04/16/20 1302  WBC 7.5 5.1  NEUTROABS 4.9 3.0  HGB 13.9 13.8  HCT 42.3 43.2  MCV 95.3 99.5  PLT 197 232   Cardiac Enzymes: No results for input(s): CKTOTAL, CKMB, CKMBINDEX, TROPONINI in the last 168 hours. BNP: Invalid input(s): POCBNP CBG: No results for input(s): GLUCAP in the last 168 hours. D-Dimer No results for input(s): DDIMER in the last 72 hours. Hgb A1c Recent Labs    04/17/20 0422  HGBA1C 6.3*   Lipid Profile Recent Labs    04/17/20 0422  CHOL 196  HDL 42  LDLCALC 132*  TRIG 108  CHOLHDL 4.7   Thyroid function studies No results for input(s): TSH, T4TOTAL, T3FREE, THYROIDAB in  the last 72 hours.  Invalid input(s): FREET3 Anemia work up No results for input(s): VITAMINB12, FOLATE, FERRITIN, TIBC, IRON, RETICCTPCT in the last 72 hours. Urinalysis    Component Value Date/Time   COLORURINE YELLOW 04/08/2020 0420   APPEARANCEUR CLEAR 04/08/2020 0420   LABSPEC 1.026 04/08/2020 0420   PHURINE 7.0 04/08/2020 0420   GLUCOSEU NEGATIVE 04/08/2020 0420   HGBUR NEGATIVE 04/08/2020 0420   BILIRUBINUR NEGATIVE 04/08/2020 0420   KETONESUR 5 (A) 04/08/2020 0420   PROTEINUR NEGATIVE  04/08/2020 0420   UROBILINOGEN 0.2 08/02/2013 1342   NITRITE POSITIVE (A) 04/08/2020 0420   LEUKOCYTESUR LARGE (A) 04/08/2020 0420   Sepsis Labs Invalid input(s): PROCALCITONIN,  WBC,  LACTICIDVEN Microbiology Recent Results (from the past 240 hour(s))  SARS Coronavirus 2 by RT PCR (hospital order, performed in [facility] hospital lab) Nasopharyngeal Nasopharyngeal Swab     Status: None   Collection Time: 04/07/20  8:09 PM   Specimen: Nasopharyngeal Swab  Result Value Ref Range Status   SARS Coronavirus 2 NEGATIVE NEGATIVE Final    Comment: (NOTE) SARS-CoV-2 target nucleic acids are NOT DETECTED.  The SARS-CoV-2 RNA is generally detectable in upper and lower respiratory specimens during the acute phase of infection. The lowest concentration of SARS-CoV-2 viral copies this assay can detect is 250 copies / mL. A negative result does not preclude SARS-CoV-2 infection and should not be used as the sole basis for treatment or other patient management decisions.  A negative result may occur with improper specimen collection / handling, submission of specimen other than nasopharyngeal swab, presence of viral mutation(s) within the areas targeted by this assay, and inadequate number of viral copies (<250 copies / mL). A negative result must be combined with clinical observations, patient history, and epidemiological information.  Fact Sheet for Patients:    StrictlyIdeas.no  Fact Sheet for Healthcare Providers: BankingDealers.co.za  This test is not yet approved or  cleared by the [place] FDA and has been authorized for detection and/or diagnosis of SARS-CoV-2 by FDA under an Emergency Use Authorization (EUA).  This EUA will remain in effect (meaning this test can be used) for the duration of the COVID-19 declaration under Section 564(b)(1) of the Act, 21 U.S.C. section 360bbb-3(b)(1), unless the authorization is terminated or revoked sooner.  Performed at [facility], [place] [address], [place], [place] [address]   Culture, Urine     Status: Abnormal   Collection Time: 04/08/20  6:30 PM   Specimen: Urine, Random  Result Value Ref Range Status   Specimen Description URINE, RANDOM  Final   Special Requests NONE  Final   Culture (A)  Final    <10,000 COLONIES/mL INSIGNIFICANT GROWTH Performed at [facility], [place] [address] [place],  [address]    Report Status 04/10/2020 FINAL  Final  SARS Coronavirus 2 by RT PCR (hospital order, performed in [facility] hospital lab) Nasopharyngeal Nasopharyngeal Swab     Status: None   Collection Time: 04/10/20  1:55 PM   Specimen: Nasopharyngeal Swab  Result Value Ref Range Status   SARS Coronavirus 2 NEGATIVE NEGATIVE Final    Comment: (NOTE) SARS-CoV-2 target nucleic acids are NOT DETECTED.  The SARS-CoV-2 RNA is generally detectable in upper and lower respiratory specimens during the acute phase of infection. The lowest concentration of SARS-CoV-2 viral copies this assay can detect is 250 copies / mL. A negative result does not preclude SARS-CoV-2 infection and should not be used as the sole basis for treatment or other patient management decisions.  A negative result may occur with improper specimen collection / handling, submission of specimen other than nasopharyngeal swab, presence of viral mutation(s) within the  areas targeted by this assay, and inadequate number of viral copies (<250 copies / mL). A negative result must be combined with clinical observations, patient history, and epidemiological information.  Fact Sheet for Patients:   StrictlyIdeas.no  Fact Sheet for Healthcare Providers: BankingDealers.co.za  This test is not yet approved or  cleared by  the [employer] and has been authorized for detection and/or diagnosis of SARS-CoV-2 by FDA under an Emergency Use Authorization (EUA).  This EUA will remain in effect (meaning this test can be used) for the duration of the COVID-19 declaration under Section 564(b)(1) of the Act, 21 U.S.C. section 360bbb-3(b)(1), unless the authorization is terminated or revoked sooner.  Performed at [facility], [place] [address] [place], [place] [address]      SIGNED:   Cordelia Poche, MD [facility] 04/17/2020, 1:15 PM

## 2020-04-17 NOTE — Progress Notes (Signed)
Gave report to RN at [employer]. Awaiting for PTAR.

## 2020-04-18 DIAGNOSIS — M1909 Primary osteoarthritis, other specified site: Secondary | ICD-10-CM | POA: Diagnosis not present

## 2020-04-18 DIAGNOSIS — I639 Cerebral infarction, unspecified: Secondary | ICD-10-CM | POA: Diagnosis not present

## 2020-04-18 DIAGNOSIS — I2510 Atherosclerotic heart disease of native coronary artery without angina pectoris: Secondary | ICD-10-CM | POA: Diagnosis not present

## 2020-04-18 DIAGNOSIS — I10 Essential (primary) hypertension: Secondary | ICD-10-CM | POA: Diagnosis not present

## 2020-04-18 DIAGNOSIS — R69 Illness, unspecified: Secondary | ICD-10-CM | POA: Diagnosis not present

## 2020-04-18 DIAGNOSIS — M6281 Muscle weakness (generalized): Secondary | ICD-10-CM | POA: Diagnosis not present

## 2020-04-18 DIAGNOSIS — E785 Hyperlipidemia, unspecified: Secondary | ICD-10-CM | POA: Diagnosis not present

## 2020-04-18 DIAGNOSIS — K0820 Unspecified atrophy of edentulous alveolar ridge: Secondary | ICD-10-CM | POA: Diagnosis not present

## 2020-04-23 DIAGNOSIS — K0820 Unspecified atrophy of edentulous alveolar ridge: Secondary | ICD-10-CM | POA: Diagnosis not present

## 2020-04-23 DIAGNOSIS — M1909 Primary osteoarthritis, other specified site: Secondary | ICD-10-CM | POA: Diagnosis not present

## 2020-04-23 DIAGNOSIS — I10 Essential (primary) hypertension: Secondary | ICD-10-CM | POA: Diagnosis not present

## 2020-04-23 DIAGNOSIS — M6281 Muscle weakness (generalized): Secondary | ICD-10-CM | POA: Diagnosis not present

## 2020-04-23 DIAGNOSIS — E785 Hyperlipidemia, unspecified: Secondary | ICD-10-CM | POA: Diagnosis not present

## 2020-04-23 DIAGNOSIS — I639 Cerebral infarction, unspecified: Secondary | ICD-10-CM | POA: Diagnosis not present

## 2020-04-23 DIAGNOSIS — R69 Illness, unspecified: Secondary | ICD-10-CM | POA: Diagnosis not present

## 2020-04-23 DIAGNOSIS — H9193 Unspecified hearing loss, bilateral: Secondary | ICD-10-CM | POA: Diagnosis not present

## 2020-04-23 DIAGNOSIS — I2510 Atherosclerotic heart disease of native coronary artery without angina pectoris: Secondary | ICD-10-CM | POA: Diagnosis not present

## 2020-04-25 DIAGNOSIS — I10 Essential (primary) hypertension: Secondary | ICD-10-CM | POA: Diagnosis not present

## 2020-04-25 DIAGNOSIS — I639 Cerebral infarction, unspecified: Secondary | ICD-10-CM | POA: Diagnosis not present

## 2020-04-25 DIAGNOSIS — M6281 Muscle weakness (generalized): Secondary | ICD-10-CM | POA: Diagnosis not present

## 2020-04-25 DIAGNOSIS — E785 Hyperlipidemia, unspecified: Secondary | ICD-10-CM | POA: Diagnosis not present

## 2020-04-25 DIAGNOSIS — I2510 Atherosclerotic heart disease of native coronary artery without angina pectoris: Secondary | ICD-10-CM | POA: Diagnosis not present

## 2020-04-25 DIAGNOSIS — K649 Unspecified hemorrhoids: Secondary | ICD-10-CM | POA: Diagnosis not present

## 2020-05-08 ENCOUNTER — Telehealth: Payer: Self-pay | Admitting: *Deleted

## 2020-05-08 NOTE — Telephone Encounter (Signed)
Patient scheduled to have [employer] 30 day Cardiac Event Monitor applied in office 05/21/2020 at 10:45AM, after appointment with Truitt Merle, NP at 10:15AM.  Patient enrollment information:  Address [facility] [address]  [place], [place] [address] [phone]  Emergency Contact Ann/ Nursing Supervisor [phone] Son [phone]

## 2020-05-12 DIAGNOSIS — M109 Gout, unspecified: Secondary | ICD-10-CM | POA: Diagnosis not present

## 2020-05-12 NOTE — Progress Notes (Signed)
CARDIOLOGY OFFICE NOTE  Date:  05/21/2020    [name] Date of Birth: 06-Jul-1929 Medical Record #[id number]  PCP:  Summer Manes, MD  Cardiologist:  [name]  Chief Complaint  Patient presents with  . Hospitalization Follow-up    History of Present Illness: [name] is a 86 y.o. female who presents today for a post hospital visit. Seen for Dr. Tamala [name].   Last seen here in 2017 by [name].   She has known history of CAD with moderate disease in the 1st DX and LAD per cath in 2002, HTN and HLD.   Admitted in August with stroke. Discharged to SNF. Then had dizziness and headache and was brought back to the hospital. BP quite high. MRI showed extension of her stroke. While there had elevated troponin. Conservative management was recommended. Monitor was to be placed - this has not been done.   Comes in today. Here with her sitter. She is not able to stand to weigh. She is hard of hearing. She has expressive aphasia. She says she is ok. Denies chest pain. BP looks to be ok. Now on Plavix. Looks to have been on Brilinta.     Past Medical History:  Diagnosis Date  . Arthritis   . CAD (coronary artery disease)    a. Mod prox LAD & first diagonal stenosis by cath 2002 - managed medically since that time with normal LV function.  . Cataract   . Hyperlipidemia   . Hypertension     Past Surgical History:  Procedure Laterality Date  . ABDOMINAL HYSTERECTOMY    . APPENDECTOMY    . BACK SURGERY    . CARPAL TUNNEL RELEASE    . CHOLECYSTECTOMY N/A 06/19/2013   Procedure: LAPAROSCOPIC CHOLECYSTECTOMY;  Surgeon: Summer Bowie, MD;  Location: [facility];  Service: General;  Laterality: N/A;  . ESOPHAGOGASTRODUODENOSCOPY N/A 04/26/2017   Procedure: ESOPHAGOGASTRODUODENOSCOPY (EGD) W/ DILATION;  Surgeon: Summer Juniper, MD;  Location: [facility];  Service: Gastroenterology;  Laterality: N/A;  . HERNIA REPAIR    . INSERTION OF MESH  05/19/2018   Procedure: INSERTION OF MESH;   Surgeon: Summer Seltzer, MD;  Location: [facility];  Service: General;;  . IR GENERIC HISTORICAL  03/18/2016   IR RADIOLOGIST EVAL & MGMT 03/18/2016 Summer Mckusick, DO [facility] INTERV RAD  . KNEE SURGERY    . SHOULDER SURGERY    . SPLENECTOMY, TOTAL    . VENTRAL HERNIA REPAIR N/A 05/19/2018   Procedure: HERNIA REPAIR VENTRAL ADULT;  Surgeon: Summer Seltzer, MD;  Location: [facility];  Service: General;  Laterality: N/A;     Medications: Current Meds  Medication Sig  . acetaminophen (TYLENOL) 325 MG tablet Take 2 tablets (650 mg total) by mouth every 6 (six) hours as needed for headache.  Marland Kitchen alum & mag hydroxide-simeth (MAALOX ADVANCED MAX ST) 400-400-40 MG/5ML suspension Take by mouth every 6 (six) hours as needed for indigestion.  Marland Kitchen aspirin EC 81 MG tablet Take 1 tablet (81 mg total) by mouth daily. Swallow whole.  . calcium carbonate (TUMS) 500 MG chewable tablet Chew 1 tablet by mouth daily.  . clopidogrel (PLAVIX) 75 MG tablet Take 75 mg by mouth daily.  Marland Kitchen labetalol (NORMODYNE) 100 MG tablet Take 1 tablet (100 mg total) by mouth 2 (two) times daily.  Marland Kitchen lisinopril (ZESTRIL) 20 MG tablet Take 1 tablet (20 mg total) by mouth daily.  . Multiple Vitamins-Minerals (PRESERVISION AREDS PO) Take 1 capsule by mouth daily.   Marland Kitchen  Omega-3 Fatty Acids (FISH OIL) 1000 MG CAPS Take 1,000 mg by mouth every other day.  . traZODone (DESYREL) 50 MG tablet Take 25 mg by mouth at bedtime as needed for sleep.   Marland Kitchen XANAX 0.25 MG tablet Take 1 tablet (0.25 mg total) by mouth every 8 (eight) hours as needed for anxiety.     Allergies: Allergies  Allergen Reactions  . Felodipine Anaphylaxis and Other (See Comments)    Reaction: unknown possible nausea or rash  . Prednisone Other (See Comments)    Unknown reaction  . Shrimp [Shellfish Allergy] Nausea And Vomiting  . Codeine   . Crestor [Rosuvastatin]   . Feldene [Piroxicam]   . Lescol [Fluvastatin]   . Lipitor [Atorvastatin]   . Nexium [Esomeprazole]   .  Pravachol [Pravastatin]   . Prilosec Otc [Omeprazole Magnesium]   . Statins Other (See Comments)    Muscle/leg pain.  Earnestine Mealing [Colesevelam]   . Zetia [Ezetimibe]   . Zithromax [Azithromycin]   . Zocor [Simvastatin]   . Omeprazole Other (See Comments)    Reaction: unknown possible nausea or rash    Social History: The patient  reports that she has never smoked. She has never used smokeless tobacco. She reports that she does not drink alcohol and does not use drugs.   Family History: The patient's family history includes Cancer in her mother and sister; Heart disease in her mother; Stroke in her father.   Review of Systems: Please see the history of present illness.   All other systems are reviewed and negative.   Physical Exam: VS:  BP 126/60   Pulse 69   SpO2 92%  .  BMI There is no height or weight on file to calculate BMI.  Wt Readings from Last 3 Encounters:  04/17/20 154 lb 1.6 oz (69.9 kg)  04/08/20 141 lb 5 oz (64.1 kg)  10/24/19 149 lb 9.6 oz (67.9 kg)    General: Alert. Elderly. She is in no acute distress. She has expressive aphasia.  Cardiac: Regular rate and rhythm. No murmurs, rubs, or gallops. No significant edema.  Respiratory:  Lungs are fairly clear to auscultation bilaterally with normal work of breathing.  GI: Soft and nontender.  MS: No deformity or atrophy. Gait not tested.  Skin: Warm and dry. Color is normal.  Neuro:  Strength and sensation are intact and no gross focal deficits noted.  Psych: Alert, appropriate and with normal affect.   LABORATORY DATA:  EKG:  EKG is not ordered today.    Lab Results  Component Value Date   WBC 5.1 04/16/2020   HGB 13.8 04/16/2020   HCT 43.2 04/16/2020   PLT 232 04/16/2020   GLUCOSE 105 (H) 04/16/2020   CHOL 196 04/17/2020   TRIG 108 04/17/2020   HDL 42 04/17/2020   LDLCALC 132 (H) 04/17/2020   ALT 20 04/08/2020   AST 25 04/08/2020   NA 139 04/16/2020   K 4.6 04/16/2020   CL 103 04/16/2020    CREATININE 0.83 04/16/2020   BUN 27 (H) 04/16/2020   CO2 27 04/16/2020   INR 1.0 04/07/2020   HGBA1C 6.3 (H) 04/17/2020     BNP (last 3 results) No results for input(s): BNP in the last 8760 hours.  ProBNP (last 3 results) No results for input(s): PROBNP in the last 8760 hours.   Other Studies Reviewed Today:  ECHO IMPRESSIONS 03/2020  1. Left ventricular ejection fraction, by estimation, is 60 to 65%. The  left ventricle has  normal function. The left ventricle has no regional  wall motion abnormalities. There is mild concentric left ventricular  hypertrophy. Left ventricular diastolic  parameters are consistent with Grade I diastolic dysfunction (impaired  relaxation). Elevated left ventricular end-diastolic pressure. The average  left ventricular global longitudinal strain is -18.7 %. The global  longitudinal strain is normal.  2. Right ventricular systolic function is normal. The right ventricular  size is normal. There is normal pulmonary artery systolic pressure.  3. Left atrial size was severely dilated.  4. The mitral valve is normal in structure. Trivial mitral valve  regurgitation. No evidence of mitral stenosis.  5. Tricuspid valve regurgitation is mild to moderate.  6. The aortic valve is tricuspid. Aortic valve regurgitation is not  visualized. No aortic stenosis is present.  7. The inferior vena cava is normal in size with <50% respiratory  variability, suggesting right atrial pressure of 8 mmHg.     ASSESSMENT AND PLAN:  1. Recent stroke with extension - at SNF. Concern for PAF - she is to have a monitor placed - this is done today. Noted that her CT showed petechial hemorrhages as well. She seems to be tolerating Plavix ok. She did 30 days of Brilinta per neurology.   2. Known CAD with remote cath - recent elevation in troponin - felt to be demand ischemia - EF is normal - to manage conservatively - she denies chest pain.   3. HTN - her BP looks  fine on current regimen. Would continue with current therapy.   4. HLD with statin intolerance - not discussed.   5. Advanced age.    Current medicines are reviewed with the patient today.  The patient does not have concerns regarding medicines other than what has been noted above.  The following changes have been made:  See above.  Labs/ tests ordered today include:   No orders of the defined types were placed in this encounter.    Disposition:   FU with [place] as needed unless the monitor is abnormal.    Patient is agreeable to this plan and will call if any problems develop in the interim.   SignedTruitt Merle, NP  05/21/2020 10:41 AM  [facility] [address] [place], [place]  [address] Phone: [phone] Fax: [phone]

## 2020-05-14 DIAGNOSIS — R69 Illness, unspecified: Secondary | ICD-10-CM | POA: Diagnosis not present

## 2020-05-14 DIAGNOSIS — K0820 Unspecified atrophy of edentulous alveolar ridge: Secondary | ICD-10-CM | POA: Diagnosis not present

## 2020-05-14 DIAGNOSIS — I10 Essential (primary) hypertension: Secondary | ICD-10-CM | POA: Diagnosis not present

## 2020-05-14 DIAGNOSIS — I639 Cerebral infarction, unspecified: Secondary | ICD-10-CM | POA: Diagnosis not present

## 2020-05-14 DIAGNOSIS — H9193 Unspecified hearing loss, bilateral: Secondary | ICD-10-CM | POA: Diagnosis not present

## 2020-05-14 DIAGNOSIS — M109 Gout, unspecified: Secondary | ICD-10-CM | POA: Diagnosis not present

## 2020-05-14 DIAGNOSIS — M6281 Muscle weakness (generalized): Secondary | ICD-10-CM | POA: Diagnosis not present

## 2020-05-14 DIAGNOSIS — E785 Hyperlipidemia, unspecified: Secondary | ICD-10-CM | POA: Diagnosis not present

## 2020-05-14 DIAGNOSIS — M1909 Primary osteoarthritis, other specified site: Secondary | ICD-10-CM | POA: Diagnosis not present

## 2020-05-16 DIAGNOSIS — R54 Age-related physical debility: Secondary | ICD-10-CM | POA: Diagnosis not present

## 2020-05-16 DIAGNOSIS — I639 Cerebral infarction, unspecified: Secondary | ICD-10-CM | POA: Diagnosis not present

## 2020-05-16 DIAGNOSIS — E785 Hyperlipidemia, unspecified: Secondary | ICD-10-CM | POA: Diagnosis not present

## 2020-05-16 DIAGNOSIS — M109 Gout, unspecified: Secondary | ICD-10-CM | POA: Diagnosis not present

## 2020-05-16 DIAGNOSIS — I4891 Unspecified atrial fibrillation: Secondary | ICD-10-CM | POA: Diagnosis not present

## 2020-05-16 DIAGNOSIS — E782 Mixed hyperlipidemia: Secondary | ICD-10-CM | POA: Diagnosis not present

## 2020-05-16 DIAGNOSIS — M1909 Primary osteoarthritis, other specified site: Secondary | ICD-10-CM | POA: Diagnosis not present

## 2020-05-16 DIAGNOSIS — I2510 Atherosclerotic heart disease of native coronary artery without angina pectoris: Secondary | ICD-10-CM | POA: Diagnosis not present

## 2020-05-16 DIAGNOSIS — J449 Chronic obstructive pulmonary disease, unspecified: Secondary | ICD-10-CM | POA: Diagnosis not present

## 2020-05-16 DIAGNOSIS — H9193 Unspecified hearing loss, bilateral: Secondary | ICD-10-CM | POA: Diagnosis not present

## 2020-05-16 DIAGNOSIS — R69 Illness, unspecified: Secondary | ICD-10-CM | POA: Diagnosis not present

## 2020-05-16 DIAGNOSIS — I10 Essential (primary) hypertension: Secondary | ICD-10-CM | POA: Diagnosis not present

## 2020-05-16 DIAGNOSIS — Z8673 Personal history of transient ischemic attack (TIA), and cerebral infarction without residual deficits: Secondary | ICD-10-CM | POA: Diagnosis not present

## 2020-05-16 DIAGNOSIS — H1045 Other chronic allergic conjunctivitis: Secondary | ICD-10-CM | POA: Diagnosis not present

## 2020-05-16 DIAGNOSIS — M6281 Muscle weakness (generalized): Secondary | ICD-10-CM | POA: Diagnosis not present

## 2020-05-16 DIAGNOSIS — Z789 Other specified health status: Secondary | ICD-10-CM | POA: Diagnosis not present

## 2020-05-16 DIAGNOSIS — K0820 Unspecified atrophy of edentulous alveolar ridge: Secondary | ICD-10-CM | POA: Diagnosis not present

## 2020-05-19 ENCOUNTER — Encounter: Payer: Self-pay | Admitting: Adult Health

## 2020-05-19 ENCOUNTER — Other Ambulatory Visit: Payer: Self-pay

## 2020-05-19 ENCOUNTER — Ambulatory Visit (INDEPENDENT_AMBULATORY_CARE_PROVIDER_SITE_OTHER): Payer: Medicare HMO | Admitting: Adult Health

## 2020-05-19 VITALS — BP 170/70 | HR 55

## 2020-05-19 DIAGNOSIS — I10 Essential (primary) hypertension: Secondary | ICD-10-CM

## 2020-05-19 DIAGNOSIS — I639 Cerebral infarction, unspecified: Secondary | ICD-10-CM | POA: Diagnosis not present

## 2020-05-19 DIAGNOSIS — Z8673 Personal history of transient ischemic attack (TIA), and cerebral infarction without residual deficits: Secondary | ICD-10-CM

## 2020-05-19 DIAGNOSIS — E785 Hyperlipidemia, unspecified: Secondary | ICD-10-CM | POA: Diagnosis not present

## 2020-05-19 MED ORDER — ASPIRIN EC 81 MG PO TBEC: 81.0000 mg | DELAYED_RELEASE_TABLET | Freq: Every day | ORAL | 11 refills | Status: DC

## 2020-05-19 NOTE — Progress Notes (Signed)
I agree with the above plan 

## 2020-05-19 NOTE — Patient Instructions (Addendum)
Continue working with therapy for ongoing great recovery  Start aspirin 81 mg daily  and continue omega 3  for secondary stroke prevention Follow up with cardiology and consider starting PCSK-9 inhibitor for secondary stroke prevention and history of statin intolerance  Complete 30 day cardiac event monitor to assess for atrial fibrillation and potential stroke etiology  Continue to follow up with PCP/cardiology regarding cholesterol and blood pressure management  Maintain strict control of hypertension with blood pressure goal below 130/90 and cholesterol with LDL cholesterol (bad cholesterol) goal below 70 mg/dL.      Followup in the future with me in 3 months or call earlier if needed       Thank you for coming to see [place] at [facility]. I hope we have been able to provide you high quality care today.  You may receive a patient satisfaction survey over the next few weeks. We would appreciate your feedback and comments so that we may continue to improve ourselves and the health of our patients. Please Be Therapeutic on [place] just not with my acute stent

## 2020-05-19 NOTE — Progress Notes (Signed)
[facility] [address] [place]. [place] [address] [phone]       STROKE FOLLOW UP NOTE  [name] Date of Birth:  04/02/1929 Medical Record Number:  [id number]   Reason for Referral: stroke follow up    CHIEF COMPLAINT:  Chief Complaint  Patient presents with  . Follow-up    rm 9  . Transient Ischemic Attack    Pt is having no new    HPI:  Today, 05/19/2020, [name] returns for stroke follow-up accompanied by [facility] nurse aide.  Since prior visit, she presented to ED on 04/07/2020 with speech difficulties and stroke work-up showing left frontal lobe cortical small infarct, embolic pattern secondary to unknown source with suspicion for A. fib.  Recommended 30-day cardiac event monitor to assess for atrial fibrillation.  Recommended DAPT for 3 weeks then as previously on aspirin.  Hypertensive urgency on arrival with BP 249/110 stabilized during admission and increased home lisinopril dosage.  LDL 144 with history of statin intolerance on omega-3 PTA and recommended follow-up with PCP outpatient and consider initiating PCSK9 inhibitor.  Residual deficits of generalized weakness and mild expressive aphasia.  Therapy is initially recommended CIR but after further evaluation, she was felt to not be a good candidate and discharged to [facility] SNF on 04/11/2020. She returned on 04/16/2020 with complaints of dizziness, worsening speech and headache with stroke work-up showing significant increase of recent left MCA stroke with trace hemorrhagic transformation.  Already on aspirin and Plavix continue to trace hemorrhagic transformation discontinued and changed to aspirin and Brilinta for 30 days and then aspirin alone.  Cardiac monitor had not been completed and she continues to decline recorder.  She was discharged back to SNF with residual expressive> receptive aphasia and right hemiparesis.  Stroke:   L frontal lobe cortical small infarct, embolic  pattern, secondary to unknown source, suspicious for AF  CT head No acute abnormality.   MRI  Inferior L frontal lobe cortical infarct. Atrophy. Old L thalamic lacune   CTA head & neck acute proximal L M3 occlusion. Mild B ICA bifurcation atherosclerosis. Possible mild pulmonary interstitial congestion.  LE Doppler  No DVT.   2D Echo EF 60-65%  30d monitor requested to look for AF as possible source of stroke - after discussion with son (pt severe hard of hearing not able to participate discussion) who feels pt old-fashioned, conservative, likely not able to accept implantable loop recorder especially with difficulty understanding the rationale with the skin cutting.  LDL 144  HgbA1c 6.2  VTE prophylaxis - Lovenox 40 mg sq daily   aspirin 81 mg daily prior to admission, now on aspirin 81 mg daily and clopidogrel 75 mg daily. Continue DAPT x 3 weeks then plavix alone  Therapy recommendations:  CIR --> SNF  Disposition: SNF Stroke: extension of previous CVA on 04/07/20  CT head Acute to subacute infarct within the left frontotemporal lobe. Territory of involvement is significantly greater than was presenton the previous MRI of 04/07/2020. There are a few tiny punctate areas of relative increased attenuation within the adjacent cortex which could reflect petechial microhemorrhage. No large/space occupying hemorrhage.  MRI  Progressed posterior left MCA ischemia since 04/07/2020 with associated Petechial Hemorrhage. But no malignant hemorrhagic transformation or mass effect.   She is accompanied today by [facility] nurse aide who does not currently work with patients at facility and is not familiar with her care.  Since discharge, she continues to reside at  [facility] with residual deficits of aphasia.  She continues to work with therapies at facility with continued improvement.  She denies residual right-sided weakness.  Ambulates short distance with rolling walker  which is her baseline. Completed 30 days of Brilinta but does not appear to be on aspirin per review of MAR.  She remains on omega-3 for HLD management.  Cardiac event monitor sent to facility on 9/23 per epic review but per patient she is currently waiting to start monitoring as this has not been received yet by facility. No concerns at this time.      History provided for reference purposes only Update 10/24/2019 JM: Mr. Hosterman is a 86 year old female who is being seen today, 10/24/2018, for TIA follow-up. She has been stable since prior visit without new or reoccurring stroke/TIA symptoms. Continues on aspirin and pravastatin for secondary stroke prevention without side effects.  Blood pressure today 138/80. She does complain of occasional left hand numbness typically worse with certain movements but overall not bothersome. No concerns at this time.   Initial visit 04/25/2019 JM: [name] is being seen today for hospital follow-up regarding recent TIA.  She has been doing well without any reoccurring or new stroke/TIA symptoms.  She is able to maintain ADLs and IADLs independently but does not drive due to arthritis pain in her knees bilaterally.  She ambulates with Rollator walker due to arthritis.  She has completed 3 weeks of DAPT and continues on aspirin alone without bleeding or bruising.  She was unable to tolerate pravastatin with prior history of statin intolerance therefore discontinued.  She does endorse recently having cholesterol levels checked by PCP which patient reports are satisfactory (unable to personally review).  Blood pressure today 162/60, asymptomatic, and does not currently monitor at home.  She does endorse recent change of amlodipine to lisinopril and is questioning whether lisinopril is working well for her.  She also states that her meals have been being prepared with more salt and sodium.  Denies new or worsening stroke/TIA symptoms.  Stroke admission 03/09/2019: [name]  is a 86 y.o. female with history of hypertension, hyperlipidemia, and coronary artery disease who had a transient episode of left arm and lip numbness.  She did not receive IV t-PA due to resolution of deficits.   CT head and MRI head negative for acute findings.  MRI did show remote lacunar infarct of the left thalamus.  MRA head and carotid Dopplers unremarkable.  2D echo showed normal EF but unable to exclude tiny PFO.  Lower extremity venous Dopplers negative.  Recommended DAPT for 3 weeks then single agent alone.  HTN stable.  LDL 138 and initiated A statin 20 mg daily.  No prior history of DM with A1c 6.2.  Other stroke risk factors include advanced age, prior history of stroke per imaging, family history of stroke and CAD.  Discharged home in stable condition.    ROS:   14 system review of systems performed and negative with exception of those listed in HPI  PMH:  Past Medical History:  Diagnosis Date  . Arthritis   . CAD (coronary artery disease)    a. Mod prox LAD & first diagonal stenosis by cath 2002 - managed medically since that time with normal LV function.  . Cataract   . Hyperlipidemia   . Hypertension     PSH:  Past Surgical History:  Procedure Laterality Date  . ABDOMINAL HYSTERECTOMY    . APPENDECTOMY    .  BACK SURGERY    . CARPAL TUNNEL RELEASE    . CHOLECYSTECTOMY N/A 06/19/2013   Procedure: LAPAROSCOPIC CHOLECYSTECTOMY;  Surgeon: Harl Bowie, MD;  Location: [facility];  Service: General;  Laterality: N/A;  . ESOPHAGOGASTRODUODENOSCOPY N/A 04/26/2017   Procedure: ESOPHAGOGASTRODUODENOSCOPY (EGD) W/ DILATION;  Surgeon: Ronnette Juniper, MD;  Location: [facility];  Service: Gastroenterology;  Laterality: N/A;  . HERNIA REPAIR    . INSERTION OF MESH  05/19/2018   Procedure: INSERTION OF MESH;  Surgeon: Excell Seltzer, MD;  Location: [facility];  Service: General;;  . IR GENERIC HISTORICAL  03/18/2016   IR RADIOLOGIST EVAL & MGMT 03/18/2016 Corrie Mckusick, DO [facility] INTERV RAD   . KNEE SURGERY    . SHOULDER SURGERY    . SPLENECTOMY, TOTAL    . VENTRAL HERNIA REPAIR N/A 05/19/2018   Procedure: HERNIA REPAIR VENTRAL ADULT;  Surgeon: Excell Seltzer, MD;  Location: [facility];  Service: General;  Laterality: N/A;    Social History:  Social History   Socioeconomic History  . Marital status: Widowed    Spouse name: Not on file  . Number of children: Not on file  . Years of education: Not on file  . Highest education level: Not on file  Occupational History  . Not on file  Tobacco Use  . Smoking status: Never Smoker  . Smokeless tobacco: Never Used  Vaping Use  . Vaping Use: Never used  Substance and Sexual Activity  . Alcohol use: No  . Drug use: No  . Sexual activity: Not on file  Other Topics Concern  . Not on file  Social History Narrative  . Not on file   Social Determinants of Health   Financial Resource Strain:   . Difficulty of Paying Living Expenses: Not on file  Food Insecurity:   . Worried About Charity fundraiser in the Last Year: Not on file  . Ran Out of Food in the Last Year: Not on file  Transportation Needs:   . Lack of Transportation (Medical): Not on file  . Lack of Transportation (Non-Medical): Not on file  Physical Activity:   . Days of Exercise per Week: Not on file  . Minutes of Exercise per Session: Not on file  Stress:   . Feeling of Stress : Not on file  Social Connections:   . Frequency of Communication with Friends and Family: Not on file  . Frequency of Social Gatherings with Friends and Family: Not on file  . Attends Religious Services: Not on file  . Active Member of Clubs or Organizations: Not on file  . Attends Archivist Meetings: Not on file  . Marital Status: Not on file  Intimate Partner Violence:   . Fear of Current or Ex-Partner: Not on file  . Emotionally Abused: Not on file  . Physically Abused: Not on file  . Sexually Abused: Not on file    Family History:  Family History  Problem  Relation Age of Onset  . Heart disease Mother        "enlarged valve"  . Cancer Mother        Ovarian  . Stroke Father   . Cancer Sister        Colon    Medications:   Current Outpatient Medications on File Prior to Visit  Medication Sig Dispense Refill  . acetaminophen (TYLENOL) 325 MG tablet Take 2 tablets (650 mg total) by mouth every 6 (six) hours as needed for headache.    Marland Kitchen  alum & mag hydroxide-simeth (MAALOX ADVANCED MAX ST) 400-400-40 MG/5ML suspension Take by mouth every 6 (six) hours as needed for indigestion.    . calcium carbonate (TUMS) 500 MG chewable tablet Chew 1 tablet by mouth daily.    . colchicine 0.6 MG tablet Take 0.6 mg by mouth daily.    Marland Kitchen labetalol (NORMODYNE) 100 MG tablet Take 1 tablet (100 mg total) by mouth 2 (two) times daily. 60 tablet 0  . lisinopril (ZESTRIL) 20 MG tablet Take 1 tablet (20 mg total) by mouth daily. 30 tablet 0  . Multiple Vitamins-Minerals (PRESERVISION AREDS PO) Take 1 capsule by mouth daily.     . Omega-3 Fatty Acids (FISH OIL PO) Take 1 capsule by mouth daily.     . traZODone (DESYREL) 50 MG tablet Take 25 mg by mouth at bedtime as needed for sleep.     Marland Kitchen XANAX 0.25 MG tablet Take 1 tablet (0.25 mg total) by mouth every 8 (eight) hours as needed for anxiety. 5 tablet 0   No current facility-administered medications on file prior to visit.    Allergies:   Allergies  Allergen Reactions  . Felodipine Anaphylaxis and Other (See Comments)    Reaction: unknown possible nausea or rash  . Prednisone Other (See Comments)    Unknown reaction  . Shrimp [Shellfish Allergy] Nausea And Vomiting  . Codeine   . Crestor [Rosuvastatin]   . Feldene [Piroxicam]   . Lescol [Fluvastatin]   . Lipitor [Atorvastatin]   . Nexium [Esomeprazole]   . Pravachol [Pravastatin]   . Prilosec Otc [Omeprazole Magnesium]   . Statins Other (See Comments)    Muscle/leg pain.  Earnestine Mealing [Colesevelam]   . Zetia [Ezetimibe]   . Zithromax [Azithromycin]   .  Zocor [Simvastatin]   . Omeprazole Other (See Comments)    Reaction: unknown possible nausea or rash     Physical Exam  Vitals:   05/19/20 1358  BP: (!) 170/70  Pulse: (!) 55   There is no height or weight on file to calculate BMI. No exam data present  General: well developed, well nourished,  pleasant elderly Caucasian female, seated, in no evident distress Head: head normocephalic and atraumatic.   Neck: supple with no carotid or supraclavicular bruits Cardiovascular: regular rate and rhythm, no murmurs Musculoskeletal: no deformity Skin:  no rash/petichiae Vascular:  Normal pulses all extremities   Neurologic Exam Mental Status: Awake and fully alert.   Mild expressive aphasia.  Possible slight receptive aphasia but difficulty fully assessing due to severe hearing loss.  Oriented to place and time. Recent and remote memory intact. Attention span, concentration and fund of knowledge appropriate during visit. Mood and affect appropriate.  Cranial Nerves: Pupils equal, briskly reactive to light. Extraocular movements full without nystagmus. Visual fields full to confrontation.  Severe HOH bilaterally.  Facial sensation intact. Face, tongue, palate moves normally and symmetrically.  Motor: Normal bulk and tone. Normal strength in all tested extremity muscles.  Unable to appreciate right-sided weakness. Sensory.: intact to touch , pinprick , position and vibratory sensation.  Coordination: Rapid alternating movements normal in all extremities. Finger-to-nose and heel-to-shin performed accurately bilaterally. Gait and Station: Deferred as rolling walker not present during visit Reflexes: 1+ and symmetric. Toes downgoing.    NIHSS: 1 (aphasia) MRS: 3     ASSESSMENT/PLAN: [name] is a 86 y.o. year old female will left frontal lobe cortical stroke in setting of L M3 occlusion secondary to unknown source on 04/07/2020 with  extension of stroke with associated petechial  hemorrhage on 04/16/2020.  History of transient episode of left arm and lip numbness on 03/09/2019 with diagnosis of likely TIA. Vascular risk factors include HTN, HLD, prior stroke on imaging and CAD.      1.  L frontal lobe stroke with extension, cryptogenic:  a. Residual deficits: Expressive aphasia with possible mild receptive aphasia.  Unable to appreciate residual right-sided deficit.  Continue to work with therapies at [facility] facility for ongoing improvement b. Complete 30-day cardiac event monitor to assess for possible atrial fibrillation.  Declined loop recorder placement. c. Completed 30 days of Brilinta.  Start aspirin 81 mg daily and continue omega 3 for secondary stroke prevention.   d. Discussed secondary stroke prevention measures and importance of close PCP follow-up for aggressive stroke risk factor management 2. Hx of TIA: See #1 3. HTN: Elevated today but per patient, stable at facility.  BP goal<130/90. On lisinopril and labetalol per facility 4. HLD: LDL goal <70.  Recent LDL 134.  History of statin intolerance. Continue omega 3 -has follow-up with cardiology Wednesday and recommend further discussing possible PCSK9 inhibitor    Follow-up in 3 months or call earlier if needed   I spent 20 minutes of face-to-face and non-face-to-face time with patient.  This included previsit chart review including recent hospitalization including pertinent progress notes, lab work and imaging, lab review, study review, order entry, electronic health record documentation, patient education regarding recent stroke with residual deficits, completing cardiac monitor to rule out atrial fibrillation, importance of managing stroke risk factors and answered all questions to patient satisfaction   Frann Rider, AGNP-BC  [facility] [address] [place], [place] [address]  Phone [phone] Fax [phone] Note: This document was prepared with digital dictation  and possible smart phrase technology. Any transcriptional errors that result from this process are unintentional.

## 2020-05-21 ENCOUNTER — Ambulatory Visit: Payer: Medicare HMO

## 2020-05-21 ENCOUNTER — Encounter: Payer: Self-pay | Admitting: Nurse Practitioner

## 2020-05-21 ENCOUNTER — Other Ambulatory Visit: Payer: Self-pay | Admitting: Cardiology

## 2020-05-21 ENCOUNTER — Ambulatory Visit (INDEPENDENT_AMBULATORY_CARE_PROVIDER_SITE_OTHER): Payer: Medicare HMO | Admitting: Nurse Practitioner

## 2020-05-21 ENCOUNTER — Encounter: Payer: Self-pay | Admitting: *Deleted

## 2020-05-21 ENCOUNTER — Other Ambulatory Visit: Payer: Self-pay

## 2020-05-21 ENCOUNTER — Ambulatory Visit (INDEPENDENT_AMBULATORY_CARE_PROVIDER_SITE_OTHER): Payer: Medicare HMO

## 2020-05-21 VITALS — BP 126/60 | HR 69

## 2020-05-21 DIAGNOSIS — E782 Mixed hyperlipidemia: Secondary | ICD-10-CM

## 2020-05-21 DIAGNOSIS — Z789 Other specified health status: Secondary | ICD-10-CM

## 2020-05-21 DIAGNOSIS — R54 Age-related physical debility: Secondary | ICD-10-CM | POA: Diagnosis not present

## 2020-05-21 DIAGNOSIS — I4891 Unspecified atrial fibrillation: Secondary | ICD-10-CM

## 2020-05-21 DIAGNOSIS — I639 Cerebral infarction, unspecified: Secondary | ICD-10-CM

## 2020-05-21 DIAGNOSIS — I10 Essential (primary) hypertension: Secondary | ICD-10-CM | POA: Diagnosis not present

## 2020-05-21 DIAGNOSIS — Z8673 Personal history of transient ischemic attack (TIA), and cerebral infarction without residual deficits: Secondary | ICD-10-CM | POA: Diagnosis not present

## 2020-05-21 NOTE — Patient Instructions (Addendum)
After Visit Summary:  We will be checking the following labs today - NONE   Medication Instructions:    Continue with your current medicines.    If you need a refill on your cardiac medications before your next appointment, please call your pharmacy.     Testing/Procedures To Be Arranged:  An event monitor is being placed today.   Follow-Up:   See [place] back as needed.     At [facility], you and your health needs are our priority.  As part of our continuing mission to provide you with exceptional heart care, we have created designated Provider Care Teams.  These Care Teams include your primary Cardiologist (physician) and Advanced Practice Providers (APPs -  Physician Assistants and Nurse Practitioners) who all work together to provide you with the care you need, when you need it.  Special Instructions:  . Stay safe, wash your hands for at least 20 seconds and wear a mask when needed.  . It was good to talk with you today.    Call the [place] office at [phone] if you have any questions, problems or concerns.

## 2020-05-23 DIAGNOSIS — M6281 Muscle weakness (generalized): Secondary | ICD-10-CM | POA: Diagnosis not present

## 2020-05-23 DIAGNOSIS — I10 Essential (primary) hypertension: Secondary | ICD-10-CM | POA: Diagnosis not present

## 2020-05-23 DIAGNOSIS — I639 Cerebral infarction, unspecified: Secondary | ICD-10-CM | POA: Diagnosis not present

## 2020-05-23 DIAGNOSIS — M109 Gout, unspecified: Secondary | ICD-10-CM | POA: Diagnosis not present

## 2020-05-23 DIAGNOSIS — I2510 Atherosclerotic heart disease of native coronary artery without angina pectoris: Secondary | ICD-10-CM | POA: Diagnosis not present

## 2020-05-23 DIAGNOSIS — E785 Hyperlipidemia, unspecified: Secondary | ICD-10-CM | POA: Diagnosis not present

## 2020-06-04 DIAGNOSIS — M1909 Primary osteoarthritis, other specified site: Secondary | ICD-10-CM | POA: Diagnosis not present

## 2020-06-09 DIAGNOSIS — K0820 Unspecified atrophy of edentulous alveolar ridge: Secondary | ICD-10-CM | POA: Diagnosis not present

## 2020-06-09 DIAGNOSIS — I10 Essential (primary) hypertension: Secondary | ICD-10-CM | POA: Diagnosis not present

## 2020-06-09 DIAGNOSIS — M6281 Muscle weakness (generalized): Secondary | ICD-10-CM | POA: Diagnosis not present

## 2020-06-09 DIAGNOSIS — M1909 Primary osteoarthritis, other specified site: Secondary | ICD-10-CM | POA: Diagnosis not present

## 2020-06-09 DIAGNOSIS — I2510 Atherosclerotic heart disease of native coronary artery without angina pectoris: Secondary | ICD-10-CM | POA: Diagnosis not present

## 2020-06-09 DIAGNOSIS — H9193 Unspecified hearing loss, bilateral: Secondary | ICD-10-CM | POA: Diagnosis not present

## 2020-06-09 DIAGNOSIS — M109 Gout, unspecified: Secondary | ICD-10-CM | POA: Diagnosis not present

## 2020-06-09 DIAGNOSIS — R69 Illness, unspecified: Secondary | ICD-10-CM | POA: Diagnosis not present

## 2020-06-09 DIAGNOSIS — E785 Hyperlipidemia, unspecified: Secondary | ICD-10-CM | POA: Diagnosis not present

## 2020-06-09 DIAGNOSIS — I639 Cerebral infarction, unspecified: Secondary | ICD-10-CM | POA: Diagnosis not present

## 2020-06-16 DIAGNOSIS — M19071 Primary osteoarthritis, right ankle and foot: Secondary | ICD-10-CM | POA: Diagnosis not present

## 2020-06-16 DIAGNOSIS — M1909 Primary osteoarthritis, other specified site: Secondary | ICD-10-CM | POA: Diagnosis not present

## 2020-06-17 DIAGNOSIS — Z79899 Other long term (current) drug therapy: Secondary | ICD-10-CM | POA: Diagnosis not present

## 2020-07-01 ENCOUNTER — Encounter: Payer: Self-pay | Admitting: *Deleted

## 2020-07-02 ENCOUNTER — Telehealth: Payer: Self-pay | Admitting: Interventional Cardiology

## 2020-07-02 NOTE — Telephone Encounter (Signed)
Spoke with son regarding monitor results requiring no further action.   Kathyrn Drown NP-C [place] Pager: [phone]

## 2020-07-02 NOTE — Telephone Encounter (Signed)
Patient's son returning call for monitor results.

## 2020-07-04 DIAGNOSIS — R059 Cough, unspecified: Secondary | ICD-10-CM | POA: Diagnosis not present

## 2020-07-05 DIAGNOSIS — J984 Other disorders of lung: Secondary | ICD-10-CM | POA: Diagnosis not present

## 2020-07-08 DIAGNOSIS — Z79899 Other long term (current) drug therapy: Secondary | ICD-10-CM | POA: Diagnosis not present

## 2020-07-08 DIAGNOSIS — M1000 Idiopathic gout, unspecified site: Secondary | ICD-10-CM | POA: Diagnosis not present

## 2020-07-08 NOTE — Progress Notes (Signed)
Kindly inform the patient that heart monitoring study was unremarkable and did not show any evidence of atrial fibrillation or any worrisome finding.

## 2020-07-09 DIAGNOSIS — F330 Major depressive disorder, recurrent, mild: Secondary | ICD-10-CM | POA: Diagnosis not present

## 2020-07-09 DIAGNOSIS — R69 Illness, unspecified: Secondary | ICD-10-CM | POA: Diagnosis not present

## 2020-07-09 DIAGNOSIS — G4701 Insomnia due to medical condition: Secondary | ICD-10-CM | POA: Diagnosis not present

## 2020-07-09 DIAGNOSIS — J159 Unspecified bacterial pneumonia: Secondary | ICD-10-CM | POA: Diagnosis not present

## 2020-07-16 DIAGNOSIS — I10 Essential (primary) hypertension: Secondary | ICD-10-CM | POA: Diagnosis not present

## 2020-07-16 DIAGNOSIS — M6281 Muscle weakness (generalized): Secondary | ICD-10-CM | POA: Diagnosis not present

## 2020-07-16 DIAGNOSIS — G4701 Insomnia due to medical condition: Secondary | ICD-10-CM | POA: Diagnosis not present

## 2020-07-16 DIAGNOSIS — R69 Illness, unspecified: Secondary | ICD-10-CM | POA: Diagnosis not present

## 2020-07-16 DIAGNOSIS — M109 Gout, unspecified: Secondary | ICD-10-CM | POA: Diagnosis not present

## 2020-07-16 DIAGNOSIS — I639 Cerebral infarction, unspecified: Secondary | ICD-10-CM | POA: Diagnosis not present

## 2020-07-16 DIAGNOSIS — I2510 Atherosclerotic heart disease of native coronary artery without angina pectoris: Secondary | ICD-10-CM | POA: Diagnosis not present

## 2020-07-16 DIAGNOSIS — M545 Low back pain, unspecified: Secondary | ICD-10-CM | POA: Diagnosis not present

## 2020-07-16 DIAGNOSIS — K5901 Slow transit constipation: Secondary | ICD-10-CM | POA: Diagnosis not present

## 2020-07-16 DIAGNOSIS — E785 Hyperlipidemia, unspecified: Secondary | ICD-10-CM | POA: Diagnosis not present

## 2020-07-17 DIAGNOSIS — M545 Low back pain, unspecified: Secondary | ICD-10-CM | POA: Diagnosis not present

## 2020-07-17 DIAGNOSIS — M47816 Spondylosis without myelopathy or radiculopathy, lumbar region: Secondary | ICD-10-CM | POA: Diagnosis not present

## 2020-07-23 DIAGNOSIS — G4701 Insomnia due to medical condition: Secondary | ICD-10-CM | POA: Diagnosis not present

## 2020-07-23 DIAGNOSIS — R69 Illness, unspecified: Secondary | ICD-10-CM | POA: Diagnosis not present

## 2020-07-23 DIAGNOSIS — F330 Major depressive disorder, recurrent, mild: Secondary | ICD-10-CM | POA: Diagnosis not present

## 2020-07-23 DIAGNOSIS — M1909 Primary osteoarthritis, other specified site: Secondary | ICD-10-CM | POA: Diagnosis not present

## 2020-07-30 DIAGNOSIS — F330 Major depressive disorder, recurrent, mild: Secondary | ICD-10-CM | POA: Diagnosis not present

## 2020-07-30 DIAGNOSIS — E785 Hyperlipidemia, unspecified: Secondary | ICD-10-CM | POA: Diagnosis not present

## 2020-07-30 DIAGNOSIS — I10 Essential (primary) hypertension: Secondary | ICD-10-CM | POA: Diagnosis not present

## 2020-07-30 DIAGNOSIS — G4701 Insomnia due to medical condition: Secondary | ICD-10-CM | POA: Diagnosis not present

## 2020-07-30 DIAGNOSIS — K5901 Slow transit constipation: Secondary | ICD-10-CM | POA: Diagnosis not present

## 2020-07-30 DIAGNOSIS — M1909 Primary osteoarthritis, other specified site: Secondary | ICD-10-CM | POA: Diagnosis not present

## 2020-07-30 DIAGNOSIS — M6281 Muscle weakness (generalized): Secondary | ICD-10-CM | POA: Diagnosis not present

## 2020-07-30 DIAGNOSIS — H9193 Unspecified hearing loss, bilateral: Secondary | ICD-10-CM | POA: Diagnosis not present

## 2020-07-30 DIAGNOSIS — I2510 Atherosclerotic heart disease of native coronary artery without angina pectoris: Secondary | ICD-10-CM | POA: Diagnosis not present

## 2020-07-30 DIAGNOSIS — R69 Illness, unspecified: Secondary | ICD-10-CM | POA: Diagnosis not present

## 2020-08-04 DIAGNOSIS — R69 Illness, unspecified: Secondary | ICD-10-CM | POA: Diagnosis not present

## 2020-08-04 DIAGNOSIS — F330 Major depressive disorder, recurrent, mild: Secondary | ICD-10-CM | POA: Diagnosis not present

## 2020-08-06 DIAGNOSIS — R69 Illness, unspecified: Secondary | ICD-10-CM | POA: Diagnosis not present

## 2020-08-06 DIAGNOSIS — F330 Major depressive disorder, recurrent, mild: Secondary | ICD-10-CM | POA: Diagnosis not present

## 2020-08-06 DIAGNOSIS — G4701 Insomnia due to medical condition: Secondary | ICD-10-CM | POA: Diagnosis not present

## 2020-08-12 DIAGNOSIS — R7982 Elevated C-reactive protein (CRP): Secondary | ICD-10-CM | POA: Diagnosis not present

## 2020-08-12 DIAGNOSIS — J40 Bronchitis, not specified as acute or chronic: Secondary | ICD-10-CM | POA: Diagnosis not present

## 2020-08-12 DIAGNOSIS — I2510 Atherosclerotic heart disease of native coronary artery without angina pectoris: Secondary | ICD-10-CM | POA: Diagnosis not present

## 2020-08-12 DIAGNOSIS — Z20822 Contact with and (suspected) exposure to covid-19: Secondary | ICD-10-CM | POA: Diagnosis not present

## 2020-08-12 DIAGNOSIS — J180 Bronchopneumonia, unspecified organism: Secondary | ICD-10-CM | POA: Diagnosis not present

## 2020-08-12 DIAGNOSIS — U071 COVID-19: Secondary | ICD-10-CM | POA: Diagnosis not present

## 2020-08-12 DIAGNOSIS — Z1152 Encounter for screening for COVID-19: Secondary | ICD-10-CM | POA: Diagnosis not present

## 2020-08-13 DIAGNOSIS — U071 COVID-19: Secondary | ICD-10-CM | POA: Diagnosis not present

## 2020-08-14 DIAGNOSIS — I2510 Atherosclerotic heart disease of native coronary artery without angina pectoris: Secondary | ICD-10-CM | POA: Diagnosis not present

## 2020-08-14 DIAGNOSIS — R7982 Elevated C-reactive protein (CRP): Secondary | ICD-10-CM | POA: Diagnosis not present

## 2020-08-14 DIAGNOSIS — Z79899 Other long term (current) drug therapy: Secondary | ICD-10-CM | POA: Diagnosis not present

## 2020-08-14 DIAGNOSIS — U071 COVID-19: Secondary | ICD-10-CM | POA: Diagnosis not present

## 2020-08-14 DIAGNOSIS — Z1152 Encounter for screening for COVID-19: Secondary | ICD-10-CM | POA: Diagnosis not present

## 2020-08-14 DIAGNOSIS — Z20822 Contact with and (suspected) exposure to covid-19: Secondary | ICD-10-CM | POA: Diagnosis not present

## 2020-08-18 DIAGNOSIS — Z1152 Encounter for screening for COVID-19: Secondary | ICD-10-CM | POA: Diagnosis not present

## 2020-08-18 DIAGNOSIS — U071 COVID-19: Secondary | ICD-10-CM | POA: Diagnosis not present

## 2020-08-18 DIAGNOSIS — Z20822 Contact with and (suspected) exposure to covid-19: Secondary | ICD-10-CM | POA: Diagnosis not present

## 2020-08-18 DIAGNOSIS — R7982 Elevated C-reactive protein (CRP): Secondary | ICD-10-CM | POA: Diagnosis not present

## 2020-08-20 DIAGNOSIS — G4701 Insomnia due to medical condition: Secondary | ICD-10-CM | POA: Diagnosis not present

## 2020-08-20 DIAGNOSIS — F330 Major depressive disorder, recurrent, mild: Secondary | ICD-10-CM | POA: Diagnosis not present

## 2020-08-20 DIAGNOSIS — R69 Illness, unspecified: Secondary | ICD-10-CM | POA: Diagnosis not present

## 2020-08-22 DIAGNOSIS — Z7901 Long term (current) use of anticoagulants: Secondary | ICD-10-CM | POA: Diagnosis not present

## 2020-08-22 DIAGNOSIS — R7982 Elevated C-reactive protein (CRP): Secondary | ICD-10-CM | POA: Diagnosis not present

## 2020-08-22 DIAGNOSIS — Z20822 Contact with and (suspected) exposure to covid-19: Secondary | ICD-10-CM | POA: Diagnosis not present

## 2020-08-22 DIAGNOSIS — U071 COVID-19: Secondary | ICD-10-CM | POA: Diagnosis not present

## 2020-08-25 DIAGNOSIS — F330 Major depressive disorder, recurrent, mild: Secondary | ICD-10-CM | POA: Diagnosis not present

## 2020-08-25 DIAGNOSIS — R69 Illness, unspecified: Secondary | ICD-10-CM | POA: Diagnosis not present

## 2020-09-02 DIAGNOSIS — B379 Candidiasis, unspecified: Secondary | ICD-10-CM | POA: Diagnosis not present

## 2020-09-02 DIAGNOSIS — R300 Dysuria: Secondary | ICD-10-CM | POA: Diagnosis not present

## 2020-09-02 DIAGNOSIS — M25569 Pain in unspecified knee: Secondary | ICD-10-CM | POA: Diagnosis not present

## 2020-09-02 DIAGNOSIS — Z8616 Personal history of COVID-19: Secondary | ICD-10-CM | POA: Diagnosis not present

## 2020-09-03 DIAGNOSIS — F330 Major depressive disorder, recurrent, mild: Secondary | ICD-10-CM | POA: Diagnosis not present

## 2020-09-03 DIAGNOSIS — R69 Illness, unspecified: Secondary | ICD-10-CM | POA: Diagnosis not present

## 2020-09-03 DIAGNOSIS — M2042 Other hammer toe(s) (acquired), left foot: Secondary | ICD-10-CM | POA: Diagnosis not present

## 2020-09-03 DIAGNOSIS — M79675 Pain in left toe(s): Secondary | ICD-10-CM | POA: Diagnosis not present

## 2020-09-03 DIAGNOSIS — I739 Peripheral vascular disease, unspecified: Secondary | ICD-10-CM | POA: Diagnosis not present

## 2020-09-03 DIAGNOSIS — B351 Tinea unguium: Secondary | ICD-10-CM | POA: Diagnosis not present

## 2020-09-03 DIAGNOSIS — M79674 Pain in right toe(s): Secondary | ICD-10-CM | POA: Diagnosis not present

## 2020-09-03 DIAGNOSIS — M170 Bilateral primary osteoarthritis of knee: Secondary | ICD-10-CM | POA: Diagnosis not present

## 2020-09-03 DIAGNOSIS — G4701 Insomnia due to medical condition: Secondary | ICD-10-CM | POA: Diagnosis not present

## 2020-09-03 DIAGNOSIS — M2041 Other hammer toe(s) (acquired), right foot: Secondary | ICD-10-CM | POA: Diagnosis not present

## 2020-09-03 DIAGNOSIS — M1711 Unilateral primary osteoarthritis, right knee: Secondary | ICD-10-CM | POA: Diagnosis not present

## 2020-09-17 ENCOUNTER — Ambulatory Visit: Payer: Medicare HMO | Admitting: Adult Health

## 2020-09-17 ENCOUNTER — Encounter: Payer: Self-pay | Admitting: Adult Health

## 2020-09-17 DIAGNOSIS — G4701 Insomnia due to medical condition: Secondary | ICD-10-CM | POA: Diagnosis not present

## 2020-09-17 DIAGNOSIS — R69 Illness, unspecified: Secondary | ICD-10-CM | POA: Diagnosis not present

## 2020-09-17 DIAGNOSIS — F330 Major depressive disorder, recurrent, mild: Secondary | ICD-10-CM | POA: Diagnosis not present

## 2020-09-17 NOTE — Progress Notes (Deleted)
[facility] [address] [place]. [place] [address] [phone]       STROKE FOLLOW UP NOTE  [name] Date of Birth:  1929/06/05 Medical Record Number:  [id number]   Reason for Referral: stroke follow up    CHIEF COMPLAINT:  No chief complaint on file.   HPI:  Today, 09/17/2020, Ms. Hanners returns for stroke follow-up accompanied by [facility] SN[facility] staff.  Patient is a poor historian due to cognitive deficit and aphasia.  Prior to today's visit, facility faxed updated progress notes which were personally reviewed.  It appears overall stable from stroke standpoint without new stroke/TIA symptoms.  Throughout reports, complains of lower back and knee pain resulting in discharge from therapy due to inability to meet goals.  Lumbar spine x-ray obtained which showed modest osteoarthritis but otherwise unremarkable.  She is currently on Lyrica 50 mg daily managed by facility.  She was positive for Covid asymptomatic at the end of December.  Unable to verify if recent lab work completed.  She has remained on aspirin 81 mg daily.  She is not currently on statin therapy due to history of statin intolerance but has remained on omega-3.  Blood pressure today ***.  Appears stable per review of facility notes.  Medication list reviewed and updated.    History provided for reference purposes only Hospital follow-up 05/19/2020 JM: She is accompanied today by [facility] nurse aide who does not currently work with patients at facility and is not familiar with her care.  Since discharge, she continues to reside at [facility] with residual deficits of aphasia.  She continues to work with therapies at facility with continued improvement.  She denies residual right-sided weakness.  Ambulates short distance with rolling walker which is her baseline. Completed 30 days of Brilinta but does not appear to be on aspirin per review of MAR.  She remains on omega-3 for HLD  management.  Cardiac event monitor sent to facility on 9/23 per epic review but per patient she is currently waiting to start monitoring as this has not been received yet by facility. No concerns at this time.   Stroke admission 04/07/2020 Ms. Allshouse returns for stroke follow-up accompanied by [facility] SNF nurse aide.  Since prior visit, she presented to ED on 04/07/2020 with speech difficulties and stroke work-up showing left frontal lobe cortical small infarct, embolic pattern secondary to unknown source with suspicion for A. fib.  Recommended 30-day cardiac event monitor to assess for atrial fibrillation.  Recommended DAPT for 3 weeks then as previously on aspirin.  Hypertensive urgency on arrival with BP 249/110 stabilized during admission and increased home lisinopril dosage.  LDL 144 with history of statin intolerance on omega-3 PTA and recommended follow-up with PCP outpatient and consider initiating PCSK9 inhibitor.  Residual deficits of generalized weakness and mild expressive aphasia.  Therapy is initially recommended CIR but after further evaluation, she was felt to not be a good candidate and discharged to [facility] on 04/11/2020. She returned on 04/16/2020 with complaints of dizziness, worsening speech and headache with stroke work-up showing significant increase of recent left MCA stroke with trace hemorrhagic transformation.  Already on aspirin and Plavix continue to trace hemorrhagic transformation discontinued and changed to aspirin and Brilinta for 30 days and then aspirin alone.  Cardiac monitor had not been completed and she continues to decline recorder.  She was discharged back to SNF with residual expressive> receptive aphasia and right hemiparesis.  Stroke:  L frontal lobe cortical small infarct, embolic pattern, secondary to unknown source, suspicious for AF  CT head No acute abnormality.   MRI  Inferior L frontal lobe cortical infarct. Atrophy. Old L thalamic lacune   CTA head  & neck acute proximal L M3 occlusion. Mild B ICA bifurcation atherosclerosis. Possible mild pulmonary interstitial congestion.  LE Doppler  No DVT.   2D Echo EF 60-65%  30d monitor requested to look for AF as possible source of stroke - after discussion with son (pt severe hard of hearing not able to participate discussion) who feels pt old-fashioned, conservative, likely not able to accept implantable loop recorder especially with difficulty understanding the rationale with the skin cutting.  LDL 144  HgbA1c 6.2  VTE prophylaxis - Lovenox 40 mg sq daily   aspirin 81 mg daily prior to admission, now on aspirin 81 mg daily and clopidogrel 75 mg daily. Continue DAPT x 3 weeks then plavix alone  Therapy recommendations:  CIR --> SNF  Disposition: SNF Stroke: extension of previous CVA on 04/07/20  CT head Acute to subacute infarct within the left frontotemporal lobe. Territory of involvement is significantly greater than was presenton the previous MRI of 04/07/2020. There are a few tiny punctate areas of relative increased attenuation within the adjacent cortex which could reflect petechial microhemorrhage. No large/space occupying hemorrhage.  MRI  Progressed posterior left MCA ischemia since 04/07/2020 with associated Petechial Hemorrhage. But no malignant hemorrhagic transformation or mass effect.     History provided for reference purposes only Update 10/24/2019 JM: Mr. Gangi is a 87 year old female who is being seen today, 10/24/2018, for TIA follow-up. She has been stable since prior visit without new or reoccurring stroke/TIA symptoms. Continues on aspirin and pravastatin for secondary stroke prevention without side effects.  Blood pressure today 138/80. She does complain of occasional left hand numbness typically worse with certain movements but overall not bothersome. No concerns at this time.   Initial visit 04/25/2019 JM: Ms. Cinco is being seen today for hospital follow-up regarding  recent TIA.  She has been doing well without any reoccurring or new stroke/TIA symptoms.  She is able to maintain ADLs and IADLs independently but does not drive due to arthritis pain in her knees bilaterally.  She ambulates with Rollator walker due to arthritis.  She has completed 3 weeks of DAPT and continues on aspirin alone without bleeding or bruising.  She was unable to tolerate pravastatin with prior history of statin intolerance therefore discontinued.  She does endorse recently having cholesterol levels checked by PCP which patient reports are satisfactory (unable to personally review).  Blood pressure today 162/60, asymptomatic, and does not currently monitor at home.  She does endorse recent change of amlodipine to lisinopril and is questioning whether lisinopril is working well for her.  She also states that her meals have been being prepared with more salt and sodium.  Denies new or worsening stroke/TIA symptoms.  Stroke admission 03/09/2019: Ms. SEMIAH SISTARE is a 87 y.o. female with history of hypertension, hyperlipidemia, and coronary artery disease who had a transient episode of left arm and lip numbness.  She did not receive IV t-PA due to resolution of deficits.   CT head and MRI head negative for acute findings.  MRI did show remote lacunar infarct of the left thalamus.  MRA head and carotid Dopplers unremarkable.  2D echo showed normal EF but unable to exclude tiny PFO.  Lower extremity venous Dopplers negative.  Recommended DAPT for  3 weeks then single agent alone.  HTN stable.  LDL 138 and initiated A statin 20 mg daily.  No prior history of DM with A1c 6.2.  Other stroke risk factors include advanced age, prior history of stroke per imaging, family history of stroke and CAD.  Discharged home in stable condition.    ROS:   14 system review of systems performed and negative with exception of those listed in HPI  PMH:  Past Medical History:  Diagnosis Date  . Arthritis   . CAD  (coronary artery disease)    a. Mod prox LAD & first diagonal stenosis by cath 2002 - managed medically since that time with normal LV function.  . Cataract   . Hyperlipidemia   . Hypertension     PSH:  Past Surgical History:  Procedure Laterality Date  . ABDOMINAL HYSTERECTOMY    . APPENDECTOMY    . BACK SURGERY    . CARPAL TUNNEL RELEASE    . CHOLECYSTECTOMY N/A 06/19/2013   Procedure: LAPAROSCOPIC CHOLECYSTECTOMY;  Surgeon: Harl Bowie, MD;  Location: [facility];  Service: General;  Laterality: N/A;  . ESOPHAGOGASTRODUODENOSCOPY N/A 04/26/2017   Procedure: ESOPHAGOGASTRODUODENOSCOPY (EGD) W/ DILATION;  Surgeon: Ronnette Juniper, MD;  Location: [facility];  Service: Gastroenterology;  Laterality: N/A;  . HERNIA REPAIR    . INSERTION OF MESH  05/19/2018   Procedure: INSERTION OF MESH;  Surgeon: Excell Seltzer, MD;  Location: [facility];  Service: General;;  . IR GENERIC HISTORICAL  03/18/2016   IR RADIOLOGIST EVAL & MGMT 03/18/2016 Corrie Mckusick, DO [facility] INTERV RAD  . KNEE SURGERY    . SHOULDER SURGERY    . SPLENECTOMY, TOTAL    . VENTRAL HERNIA REPAIR N/A 05/19/2018   Procedure: HERNIA REPAIR VENTRAL ADULT;  Surgeon: Excell Seltzer, MD;  Location: [facility];  Service: General;  Laterality: N/A;    Social History:  Social History   Socioeconomic History  . Marital status: Widowed    Spouse name: Not on file  . Number of children: Not on file  . Years of education: Not on file  . Highest education level: Not on file  Occupational History  . Not on file  Tobacco Use  . Smoking status: Never Smoker  . Smokeless tobacco: Never Used  Vaping Use  . Vaping Use: Never used  Substance and Sexual Activity  . Alcohol use: No  . Drug use: No  . Sexual activity: Not on file  Other Topics Concern  . Not on file  Social History Narrative  . Not on file   Social Determinants of Health   Financial Resource Strain: Not on file  Food Insecurity: Not on file  Transportation Needs:  Not on file  Physical Activity: Not on file  Stress: Not on file  Social Connections: Not on file  Intimate Partner Violence: Not on file    Family History:  Family History  Problem Relation Age of Onset  . Heart disease Mother        "enlarged valve"  . Cancer Mother        Ovarian  . Stroke Father   . Cancer Sister        Colon    Medications:   Current Outpatient Medications on File Prior to Visit  Medication Sig Dispense Refill  . acetaminophen (TYLENOL) 325 MG tablet Take 2 tablets (650 mg total) by mouth every 6 (six) hours as needed for headache.    Marland Kitchen alum & mag hydroxide-simeth (MAALOX ADVANCED  MAX ST) 086-578-46 MG/5ML suspension Take by mouth every 6 (six) hours as needed for indigestion.    Marland Kitchen aspirin EC 81 MG tablet Take 1 tablet (81 mg total) by mouth daily. Swallow whole. 30 tablet 11  . calcium carbonate (TUMS) 500 MG chewable tablet Chew 1 tablet by mouth daily.    . clopidogrel (PLAVIX) 75 MG tablet Take 75 mg by mouth daily.    Marland Kitchen labetalol (NORMODYNE) 100 MG tablet Take 1 tablet (100 mg total) by mouth 2 (two) times daily. 60 tablet 0  . lisinopril (ZESTRIL) 20 MG tablet Take 1 tablet (20 mg total) by mouth daily. 30 tablet 0  . Multiple Vitamins-Minerals (PRESERVISION AREDS PO) Take 1 capsule by mouth daily.     . Omega-3 Fatty Acids (FISH OIL) 1000 MG CAPS Take 1,000 mg by mouth every other day.    . traZODone (DESYREL) 50 MG tablet Take 25 mg by mouth at bedtime as needed for sleep.     Marland Kitchen XANAX 0.25 MG tablet Take 1 tablet (0.25 mg total) by mouth every 8 (eight) hours as needed for anxiety. 5 tablet 0   No current facility-administered medications on file prior to visit.    Allergies:   Allergies  Allergen Reactions  . Felodipine Anaphylaxis and Other (See Comments)    Reaction: unknown possible nausea or rash  . Prednisone Other (See Comments)    Unknown reaction  . Shrimp [Shellfish Allergy] Nausea And Vomiting  . Codeine   . Crestor [Rosuvastatin]    . Feldene [Piroxicam]   . Lescol [Fluvastatin]   . Lipitor [Atorvastatin]   . Nexium [Esomeprazole]   . Pravachol [Pravastatin]   . Prilosec Otc [Omeprazole Magnesium]   . Statins Other (See Comments)    Muscle/leg pain.  Earnestine Mealing [Colesevelam]   . Zetia [Ezetimibe]   . Zithromax [Azithromycin]   . Zocor [Simvastatin]   . Omeprazole Other (See Comments)    Reaction: unknown possible nausea or rash     Physical Exam  There were no vitals filed for this visit. There is no height or weight on file to calculate BMI. No exam data present  General: well developed, well nourished,  pleasant elderly Caucasian female, seated, in no evident distress Head: head normocephalic and atraumatic.   Neck: supple with no carotid or supraclavicular bruits Cardiovascular: regular rate and rhythm, no murmurs Musculoskeletal: no deformity Skin:  no rash/petichiae Vascular:  Normal pulses all extremities   Neurologic Exam Mental Status: Awake and fully alert.   Mild expressive aphasia.  Possible slight receptive aphasia but difficulty fully assessing due to severe hearing loss.  Oriented to place and time. Recent and remote memory intact. Attention span, concentration and fund of knowledge appropriate during visit. Mood and affect appropriate.  Cranial Nerves: Pupils equal, briskly reactive to light. Extraocular movements full without nystagmus. Visual fields full to confrontation.  Severe HOH bilaterally.  Facial sensation intact. Face, tongue, palate moves normally and symmetrically.  Motor: Normal bulk and tone. Normal strength in all tested extremity muscles.  Unable to appreciate right-sided weakness. Sensory.: intact to touch , pinprick , position and vibratory sensation.  Coordination: Rapid alternating movements normal in all extremities. Finger-to-nose and heel-to-shin performed accurately bilaterally. Gait and Station: Deferred as rolling walker not present during visit Reflexes: 1+ and  symmetric. Toes downgoing.         ASSESSMENT/PLAN: SAPRINA CHUONG is a 87 y.o. year old female will left frontal lobe cortical stroke in setting of L  M3 occlusion secondary to unknown source on 04/07/2020 with extension of stroke with associated petechial hemorrhage on 04/16/2020.  History of transient episode of left arm and lip numbness on 03/09/2019 with diagnosis of likely TIA. Vascular risk factors include HTN, HLD, prior stroke on imaging and CAD.      1.  L frontal lobe stroke with extension, cryptogenic; hx of TIA:  a. Residual deficits: Expressive aphasia with possible mild receptive aphasia.  Unable to appreciate residual right-sided deficit.  Continue to work with therapies at [facility] facility for ongoing improvement b. Complete 30-day cardiac event monitor to assess for possible atrial fibrillation.  Declined loop recorder placement. c. Continue aspirin 81 mg daily and omega 3 for secondary stroke prevention.   d. Discussed secondary stroke prevention measures and importance of close PCP follow-up for aggressive stroke risk factor management 2. HTN: BP goal<130/90.  Stable on lisinopril and labetalol per facility 3. HLD: LDL goal <70.  Recent LDL 134.  History of statin intolerance.  On omega-3 fatty acid.    Follow-up in 3 months or call earlier if needed  CC:  [facility] provider: Dr. Zigmund Gottron, Christiane Ha, MD    I spent 20 minutes of face-to-face and non-face-to-face time with patient.  This included previsit chart review including recent hospitalization including pertinent progress notes, lab work and imaging, lab review, study review, order entry, electronic health record documentation, patient education regarding recent stroke with residual deficits, completing cardiac monitor to rule out atrial fibrillation, importance of managing stroke risk factors and answered all questions to patient satisfaction   Frann Rider, AGNP-BC  [facility] [address]  [place], [place] [address]  Phone [phone] Fax [phone] Note: This document was prepared with digital dictation and possible smart phrase technology. Any transcriptional errors that result from this process are unintentional.

## 2020-09-22 DIAGNOSIS — F330 Major depressive disorder, recurrent, mild: Secondary | ICD-10-CM | POA: Diagnosis not present

## 2020-09-22 DIAGNOSIS — R69 Illness, unspecified: Secondary | ICD-10-CM | POA: Diagnosis not present

## 2020-09-30 ENCOUNTER — Ambulatory Visit (INDEPENDENT_AMBULATORY_CARE_PROVIDER_SITE_OTHER): Payer: Medicare HMO | Admitting: Adult Health

## 2020-09-30 ENCOUNTER — Encounter: Payer: Self-pay | Admitting: Adult Health

## 2020-09-30 VITALS — BP 158/78 | HR 55 | Ht 63.0 in

## 2020-09-30 DIAGNOSIS — I639 Cerebral infarction, unspecified: Secondary | ICD-10-CM

## 2020-09-30 DIAGNOSIS — I69320 Aphasia following cerebral infarction: Secondary | ICD-10-CM

## 2020-09-30 NOTE — Patient Instructions (Signed)
Overall stable from stroke standpoint with residual aphasia which has been stable without worsening  Continue current treatment regimen and routine follow-up with PCP for aggressive stroke risk factor management    No further recommendations or interventions required from our standpoint and recommend follow-up on an as-needed basis regarding prior stroke    Thank you for coming to see [place] at [facility]. I hope we have been able to provide you high quality care today.  You may receive a patient satisfaction survey over the next few weeks. We would appreciate your feedback and comments so that we may continue to improve ourselves and the health of our patients.

## 2020-09-30 NOTE — Progress Notes (Signed)
[facility] [address] [place] [address] [phone]       STROKE FOLLOW UP NOTE  [name] Date of Birth:  [birth year] Medical Record Number:  [id number]   Reason for Referral: stroke follow up    CHIEF COMPLAINT:  Chief Complaint  Patient presents with  . Follow-up    Rm 14 with angela (cna) Pt is dong ok, cant think of anything going on    HPI:  Today, 09/30/2020, [name] returns for stroke follow-up accompanied by [facility] SN[facility] staff.  Patient is a poor historian due to cognitive deficit and aphasia.  Prior to today's visit, facility faxed updated progress notes which were personally reviewed.  It appears overall stable from stroke standpoint without new stroke/TIA symptoms.  She presently declines any new or worsening stroke/TIA symptoms.  She was positive for Covid asymptomatic at the end of December.  Unable to verify if recent lab work completed.  She has remained on aspirin 81 mg daily.  She is not currently on statin therapy due to history of statin intolerance but has remained on omega-3.  Blood pressure today 158/78.  Appears stable per review of facility notes.  Medication list reviewed and updated.  She has no concerns at this time.    History provided for reference purposes only Hospital follow-up 05/19/2020 JM: She is accompanied today by [facility] nurse aide who does not currently work with patients at facility and is not familiar with her care.  Since discharge, she continues to reside at [facility] with residual deficits of aphasia.  She continues to work with therapies at facility with continued improvement.  She denies residual right-sided weakness.  Ambulates short distance with rolling walker which is her baseline. Completed 30 days of Brilinta but does not appear to be on aspirin per review of MAR.  She remains on omega-3 for HLD management.  Cardiac event monitor sent to facility on 9/23 per epic review  but per patient she is currently waiting to start monitoring as this has not been received yet by facility. No concerns at this time.   Stroke admission 04/07/2020 [name] returns for stroke follow-up accompanied by [facility] nurse aide.  Since prior visit, she presented to ED on 04/07/2020 with speech difficulties and stroke work-up showing left frontal lobe cortical small infarct, embolic pattern secondary to unknown source with suspicion for A. fib.  Recommended 30-day cardiac event monitor to assess for atrial fibrillation.  Recommended DAPT for 3 weeks then as previously on aspirin.  Hypertensive urgency on arrival with BP 249/110 stabilized during admission and increased home lisinopril dosage.  LDL 144 with history of statin intolerance on omega-3 PTA and recommended follow-up with PCP outpatient and consider initiating PCSK9 inhibitor.  Residual deficits of generalized weakness and mild expressive aphasia.  Therapy is initially recommended CIR but after further evaluation, she was felt to not be a good candidate and discharged to [facility] SNF on 04/11/2020. She returned on 04/16/2020 with complaints of dizziness, worsening speech and headache with stroke work-up showing significant increase of recent left MCA stroke with trace hemorrhagic transformation.  Already on aspirin and Plavix continue to trace hemorrhagic transformation discontinued and changed to aspirin and Brilinta for 30 days and then aspirin alone.  Cardiac monitor had not been completed and she continues to decline recorder.  She was discharged back to SNF with residual expressive> receptive aphasia and right hemiparesis.  Stroke:   L frontal lobe cortical  small infarct, embolic pattern, secondary to unknown source, suspicious for AF  CT head No acute abnormality.   MRI  Inferior L frontal lobe cortical infarct. Atrophy. Old L thalamic lacune   CTA head & neck acute proximal L M3 occlusion. Mild B ICA bifurcation  atherosclerosis. Possible mild pulmonary interstitial congestion.  LE Doppler  No DVT.   2D Echo EF 60-65%  30d monitor requested to look for AF as possible source of stroke - after discussion with son (pt severe hard of hearing not able to participate discussion) who feels pt old-fashioned, conservative, likely not able to accept implantable loop recorder especially with difficulty understanding the rationale with the skin cutting.  LDL 144  HgbA1c 6.2  VTE prophylaxis - Lovenox 40 mg sq daily   aspirin 81 mg daily prior to admission, now on aspirin 81 mg daily and clopidogrel 75 mg daily. Continue DAPT x 3 weeks then plavix alone  Therapy recommendations:  CIR --> SNF  Disposition: SNF Stroke: extension of previous CVA on 04/07/20  CT head Acute to subacute infarct within the left frontotemporal lobe. Territory of involvement is significantly greater than was presenton the previous MRI of 04/07/2020. There are a few tiny punctate areas of relative increased attenuation within the adjacent cortex which could reflect petechial microhemorrhage. No large/space occupying hemorrhage.  MRI  Progressed posterior left MCA ischemia since 04/07/2020 with associated Petechial Hemorrhage. But no malignant hemorrhagic transformation or mass effect.     History provided for reference purposes only Update 10/24/2019 JM: [name] is a 87 year old female who is being seen today, 10/24/2018, for TIA follow-up. She has been stable since prior visit without new or reoccurring stroke/TIA symptoms. Continues on aspirin and pravastatin for secondary stroke prevention without side effects.  Blood pressure today 138/80. She does complain of occasional left hand numbness typically worse with certain movements but overall not bothersome. No concerns at this time.   Initial visit 04/25/2019 JM: [name] is being seen today for hospital follow-up regarding recent TIA.  She has been doing well without any reoccurring  or new stroke/TIA symptoms.  She is able to maintain ADLs and IADLs independently but does not drive due to arthritis pain in her knees bilaterally.  She ambulates with Rollator walker due to arthritis.  She has completed 3 weeks of DAPT and continues on aspirin alone without bleeding or bruising.  She was unable to tolerate pravastatin with prior history of statin intolerance therefore discontinued.  She does endorse recently having cholesterol levels checked by PCP which patient reports are satisfactory (unable to personally review).  Blood pressure today 162/60, asymptomatic, and does not currently monitor at home.  She does endorse recent change of amlodipine to lisinopril and is questioning whether lisinopril is working well for her.  She also states that her meals have been being prepared with more salt and sodium.  Denies new or worsening stroke/TIA symptoms.  Stroke admission 03/09/2019: [name] is a 87 y.o. female with history of hypertension, hyperlipidemia, and coronary artery disease who had a transient episode of left arm and lip numbness.  She did not receive IV t-PA due to resolution of deficits.   CT head and MRI head negative for acute findings.  MRI did show remote lacunar infarct of the left thalamus.  MRA head and carotid Dopplers unremarkable.  2D echo showed normal EF but unable to exclude tiny PFO.  Lower extremity venous Dopplers negative.  Recommended DAPT for 3 weeks then single  agent alone.  HTN stable.  LDL 138 and initiated A statin 20 mg daily.  No prior history of DM with A1c 6.2.  Other stroke risk factors include advanced age, prior history of stroke per imaging, family history of stroke and CAD.  Discharged home in stable condition.    ROS:   14 system review of systems performed and negative with exception of those listed in HPI  PMH:  Past Medical History:  Diagnosis Date  . Arthritis   . CAD (coronary artery disease)    a. Mod prox LAD & first diagonal  stenosis by cath 2002 - managed medically since that time with normal LV function.  . Cataract   . Hyperlipidemia   . Hypertension     PSH:  Past Surgical History:  Procedure Laterality Date  . ABDOMINAL HYSTERECTOMY    . APPENDECTOMY    . BACK SURGERY    . CARPAL TUNNEL RELEASE    . CHOLECYSTECTOMY N/A 06/19/2013   Procedure: LAPAROSCOPIC CHOLECYSTECTOMY;  Surgeon: Harl Bowie, MD;  Location: [place];  Service: General;  Laterality: N/A;  . ESOPHAGOGASTRODUODENOSCOPY N/A 04/26/2017   Procedure: ESOPHAGOGASTRODUODENOSCOPY (EGD) W/ DILATION;  Surgeon: Ronnette Juniper, MD;  Location: [facility];  Service: Gastroenterology;  Laterality: N/A;  . HERNIA REPAIR    . INSERTION OF MESH  05/19/2018   Procedure: INSERTION OF MESH;  Surgeon: Excell Seltzer, MD;  Location: [facility];  Service: General;;  . IR GENERIC HISTORICAL  03/18/2016   IR RADIOLOGIST EVAL & MGMT 03/18/2016 Corrie Mckusick, DO [facility] INTERV RAD  . KNEE SURGERY    . SHOULDER SURGERY    . SPLENECTOMY, TOTAL    . VENTRAL HERNIA REPAIR N/A 05/19/2018   Procedure: HERNIA REPAIR VENTRAL ADULT;  Surgeon: Excell Seltzer, MD;  Location: [facility];  Service: General;  Laterality: N/A;    Social History:  Social History   Socioeconomic History  . Marital status: Widowed    Spouse name: Not on file  . Number of children: Not on file  . Years of education: Not on file  . Highest education level: Not on file  Occupational History  . Not on file  Tobacco Use  . Smoking status: Never Smoker  . Smokeless tobacco: Never Used  Vaping Use  . Vaping Use: Never used  Substance and Sexual Activity  . Alcohol use: No  . Drug use: No  . Sexual activity: Not on file  Other Topics Concern  . Not on file  Social History Narrative  . Not on file   Social Determinants of Health   Financial Resource Strain: Not on file  Food Insecurity: Not on file  Transportation Needs: Not on file  Physical Activity: Not on file  Stress: Not on  file  Social Connections: Not on file  Intimate Partner Violence: Not on file    Family History:  Family History  Problem Relation Age of Onset  . Heart disease Mother        "enlarged valve"  . Cancer Mother        Ovarian  . Stroke Father   . Cancer Sister        Colon    Medications:   Current Outpatient Medications on File Prior to Visit  Medication Sig Dispense Refill  . allopurinol (ZYLOPRIM) 100 MG tablet Take 100 mg by mouth daily.    Marland Kitchen aspirin EC 81 MG tablet Take 1 tablet (81 mg total) by mouth daily. Swallow whole. 30 tablet 11  .  labetalol (NORMODYNE) 100 MG tablet Take 1 tablet (100 mg total) by mouth 2 (two) times daily. 60 tablet 0  . lisinopril (ZESTRIL) 20 MG tablet Take 1 tablet (20 mg total) by mouth daily. 30 tablet 0  . Multiple Vitamins-Minerals (PRESERVISION AREDS PO) Take 1 capsule by mouth daily.     . Omega-3 Fatty Acids (FISH OIL) 1000 MG CAPS Take 1,000 mg by mouth every other day.    . pregabalin (LYRICA) 50 MG capsule Take 50 mg by mouth daily.    . sertraline (ZOLOFT) 25 MG tablet Take 25 mg by mouth daily.    Marland Kitchen XANAX 0.25 MG tablet Take 1 tablet (0.25 mg total) by mouth every 8 (eight) hours as needed for anxiety. 5 tablet 0   No current facility-administered medications on file prior to visit.    Allergies:   Allergies  Allergen Reactions  . Felodipine Anaphylaxis and Other (See Comments)    Reaction: unknown possible nausea or rash  . Prednisone Other (See Comments)    Unknown reaction  . Shrimp [Shellfish Allergy] Nausea And Vomiting  . Codeine   . Crestor [Rosuvastatin]   . Feldene [Piroxicam]   . Lescol [Fluvastatin]   . Lipitor [Atorvastatin]   . Nexium [Esomeprazole]   . Pravachol [Pravastatin]   . Prilosec Otc [Omeprazole Magnesium]   . Statins Other (See Comments)    Muscle/leg pain.  Earnestine Mealing [Colesevelam]   . Zetia [Ezetimibe]   . Zithromax [Azithromycin]   . Zocor [Simvastatin]   . Omeprazole Other (See Comments)     Reaction: unknown possible nausea or rash     Physical Exam  Vitals:   09/30/20 1325  BP: (!) 158/78  Pulse: (!) 55  Height: 5\' 3"  (1.6 m)   Body mass index is 27.3 kg/m. No exam data present  General: well developed, well nourished, very pleasant elderly Caucasian female, seated, in no evident distress Head: head normocephalic and atraumatic.   Neck: supple with no carotid or supraclavicular bruits Cardiovascular: regular rate and rhythm, no murmurs Musculoskeletal: no deformity Skin:  no rash/petichiae Vascular:  Normal pulses all extremities   Neurologic Exam Mental Status: Awake and fully alert. Mild expressive aphasia. Oriented to place and time. Recent memory impaired and remote memory intact. Attention span, concentration and fund of knowledge appropriate during visit. Mood and affect appropriate.  Cranial Nerves: Pupils equal, briskly reactive to light. Extraocular movements full without nystagmus. Visual fields full to confrontation.  Severe HOH bilaterally.  Facial sensation intact. Face, tongue, palate moves normally and symmetrically.  Motor: Normal bulk and tone. Normal strength in all tested extremity muscles. Sensory.: intact to touch , pinprick , position and vibratory sensation.  Coordination: Rapid alternating movements normal in all extremities. Finger-to-nose and heel-to-shin performed accurately bilaterally. Gait and Station: Deferred as rolling walker not present during visit Reflexes: 1+ and symmetric. Toes downgoing.         ASSESSMENT/PLAN: [name] is a 87 y.o. year old female will left frontal lobe cortical stroke in setting of L M3 occlusion secondary to unknown source on 04/07/2020 with extension of stroke with associated petechial hemorrhage on 04/16/2020.  History of transient episode of left arm and lip numbness on 03/09/2019 with diagnosis of likely TIA. Vascular risk factors include HTN, HLD, prior stroke on imaging and CAD.      1.  L  frontal lobe stroke with extension, cryptogenic; hx of TIA:  a. Residual deficits: Mild expressive aphasia and mild cognitive impairment -  stable without worsening.   b. 30-day cardiac event monitor negative for atrial fibrillation c. Continue aspirin 81 mg daily and omega 3 for secondary stroke prevention.   d. Discussed secondary stroke prevention measures and importance of close PCP follow-up for aggressive stroke risk factor management 2. HTN: BP goal<130/90.  Stable on lisinopril and labetalol per facility 3. HLD: LDL goal <70. History of statin intolerance.  On omega-3 fatty acid.  Request facility/PCP routinely monitor lipid panel and treat as indicated    Overall stable from stroke standpoint routinely followed by facility/PCP therefore recommend follow-up on an as-needed basis regarding prior stroke   CC:  [facility] provider: Dr. Zigmund Gottron, Christiane Ha, MD    I spent 30 minutes of face-to-face and non-face-to-face time with patient and facility caregiver.  This included previsit chart review including progress notes provided by facility, lab review, study review, order entry, electronic health record documentation, patient education regarding recent stroke with residual deficits, importance of managing stroke risk factors and answered all other questions to patient satisfaction   Frann Rider, [facility]  [facility] [address] [place], [place] [address]  Phone [phone] Fax [phone] Note: This document was prepared with digital dictation and possible smart phrase technology. Any transcriptional errors that result from this process are unintentional.

## 2020-09-30 NOTE — Progress Notes (Signed)
I agree with the above plan 

## 2020-10-01 DIAGNOSIS — F330 Major depressive disorder, recurrent, mild: Secondary | ICD-10-CM | POA: Diagnosis not present

## 2020-10-01 DIAGNOSIS — R69 Illness, unspecified: Secondary | ICD-10-CM | POA: Diagnosis not present

## 2020-10-01 DIAGNOSIS — G4701 Insomnia due to medical condition: Secondary | ICD-10-CM | POA: Diagnosis not present

## 2020-10-02 DIAGNOSIS — M1711 Unilateral primary osteoarthritis, right knee: Secondary | ICD-10-CM | POA: Diagnosis not present

## 2020-10-02 DIAGNOSIS — G4700 Insomnia, unspecified: Secondary | ICD-10-CM | POA: Diagnosis not present

## 2020-10-02 DIAGNOSIS — R69 Illness, unspecified: Secondary | ICD-10-CM | POA: Diagnosis not present

## 2020-10-02 DIAGNOSIS — M25561 Pain in right knee: Secondary | ICD-10-CM | POA: Diagnosis not present

## 2020-10-08 DIAGNOSIS — H1013 Acute atopic conjunctivitis, bilateral: Secondary | ICD-10-CM | POA: Diagnosis not present

## 2020-10-08 DIAGNOSIS — H04203 Unspecified epiphora, bilateral lacrimal glands: Secondary | ICD-10-CM | POA: Diagnosis not present

## 2020-10-15 DIAGNOSIS — G4701 Insomnia due to medical condition: Secondary | ICD-10-CM | POA: Diagnosis not present

## 2020-10-15 DIAGNOSIS — R69 Illness, unspecified: Secondary | ICD-10-CM | POA: Diagnosis not present

## 2020-10-15 DIAGNOSIS — F330 Major depressive disorder, recurrent, mild: Secondary | ICD-10-CM | POA: Diagnosis not present

## 2020-10-16 DIAGNOSIS — H1013 Acute atopic conjunctivitis, bilateral: Secondary | ICD-10-CM | POA: Diagnosis not present

## 2020-10-16 DIAGNOSIS — H6123 Impacted cerumen, bilateral: Secondary | ICD-10-CM | POA: Diagnosis not present

## 2020-10-21 DIAGNOSIS — H6123 Impacted cerumen, bilateral: Secondary | ICD-10-CM | POA: Diagnosis not present

## 2020-10-21 DIAGNOSIS — H1013 Acute atopic conjunctivitis, bilateral: Secondary | ICD-10-CM | POA: Diagnosis not present

## 2020-10-29 DIAGNOSIS — G4701 Insomnia due to medical condition: Secondary | ICD-10-CM | POA: Diagnosis not present

## 2020-10-29 DIAGNOSIS — R69 Illness, unspecified: Secondary | ICD-10-CM | POA: Diagnosis not present

## 2020-10-29 DIAGNOSIS — F330 Major depressive disorder, recurrent, mild: Secondary | ICD-10-CM | POA: Diagnosis not present

## 2020-11-05 DIAGNOSIS — K219 Gastro-esophageal reflux disease without esophagitis: Secondary | ICD-10-CM | POA: Diagnosis not present

## 2020-11-05 DIAGNOSIS — H6123 Impacted cerumen, bilateral: Secondary | ICD-10-CM | POA: Diagnosis not present

## 2020-11-06 DIAGNOSIS — R1310 Dysphagia, unspecified: Secondary | ICD-10-CM | POA: Diagnosis not present

## 2020-11-06 DIAGNOSIS — R093 Abnormal sputum: Secondary | ICD-10-CM | POA: Diagnosis not present

## 2020-11-11 DIAGNOSIS — B351 Tinea unguium: Secondary | ICD-10-CM | POA: Diagnosis not present

## 2020-11-11 DIAGNOSIS — M2042 Other hammer toe(s) (acquired), left foot: Secondary | ICD-10-CM | POA: Diagnosis not present

## 2020-11-11 DIAGNOSIS — M2041 Other hammer toe(s) (acquired), right foot: Secondary | ICD-10-CM | POA: Diagnosis not present

## 2020-11-11 DIAGNOSIS — I739 Peripheral vascular disease, unspecified: Secondary | ICD-10-CM | POA: Diagnosis not present

## 2020-11-11 DIAGNOSIS — L603 Nail dystrophy: Secondary | ICD-10-CM | POA: Diagnosis not present

## 2020-11-12 DIAGNOSIS — R69 Illness, unspecified: Secondary | ICD-10-CM | POA: Diagnosis not present

## 2020-11-12 DIAGNOSIS — I10 Essential (primary) hypertension: Secondary | ICD-10-CM | POA: Diagnosis not present

## 2020-11-12 DIAGNOSIS — M1909 Primary osteoarthritis, other specified site: Secondary | ICD-10-CM | POA: Diagnosis not present

## 2020-11-13 DIAGNOSIS — R7982 Elevated C-reactive protein (CRP): Secondary | ICD-10-CM | POA: Diagnosis not present

## 2020-11-13 DIAGNOSIS — Z79899 Other long term (current) drug therapy: Secondary | ICD-10-CM | POA: Diagnosis not present

## 2020-11-13 DIAGNOSIS — Z7901 Long term (current) use of anticoagulants: Secondary | ICD-10-CM | POA: Diagnosis not present

## 2020-11-17 DIAGNOSIS — R7989 Other specified abnormal findings of blood chemistry: Secondary | ICD-10-CM | POA: Diagnosis not present

## 2020-11-17 DIAGNOSIS — M170 Bilateral primary osteoarthritis of knee: Secondary | ICD-10-CM | POA: Diagnosis not present

## 2020-11-17 DIAGNOSIS — E559 Vitamin D deficiency, unspecified: Secondary | ICD-10-CM | POA: Diagnosis not present

## 2020-11-17 DIAGNOSIS — R5383 Other fatigue: Secondary | ICD-10-CM | POA: Diagnosis not present

## 2020-11-19 DIAGNOSIS — E119 Type 2 diabetes mellitus without complications: Secondary | ICD-10-CM | POA: Diagnosis not present

## 2020-11-19 DIAGNOSIS — Z79899 Other long term (current) drug therapy: Secondary | ICD-10-CM | POA: Diagnosis not present

## 2020-11-19 DIAGNOSIS — E785 Hyperlipidemia, unspecified: Secondary | ICD-10-CM | POA: Diagnosis not present

## 2020-11-19 DIAGNOSIS — E039 Hypothyroidism, unspecified: Secondary | ICD-10-CM | POA: Diagnosis not present

## 2020-11-26 DIAGNOSIS — I6782 Cerebral ischemia: Secondary | ICD-10-CM | POA: Diagnosis not present

## 2020-11-26 DIAGNOSIS — R441 Visual hallucinations: Secondary | ICD-10-CM | POA: Diagnosis not present

## 2020-11-26 DIAGNOSIS — R69 Illness, unspecified: Secondary | ICD-10-CM | POA: Diagnosis not present

## 2020-11-26 DIAGNOSIS — R635 Abnormal weight gain: Secondary | ICD-10-CM | POA: Diagnosis not present

## 2020-11-26 DIAGNOSIS — F330 Major depressive disorder, recurrent, mild: Secondary | ICD-10-CM | POA: Diagnosis not present

## 2020-11-26 DIAGNOSIS — R410 Disorientation, unspecified: Secondary | ICD-10-CM | POA: Diagnosis not present

## 2020-11-26 DIAGNOSIS — G4701 Insomnia due to medical condition: Secondary | ICD-10-CM | POA: Diagnosis not present

## 2020-11-27 DIAGNOSIS — I509 Heart failure, unspecified: Secondary | ICD-10-CM | POA: Diagnosis not present

## 2020-11-27 DIAGNOSIS — N390 Urinary tract infection, site not specified: Secondary | ICD-10-CM | POA: Diagnosis not present

## 2020-11-28 DIAGNOSIS — R410 Disorientation, unspecified: Secondary | ICD-10-CM | POA: Diagnosis not present

## 2020-11-28 DIAGNOSIS — N390 Urinary tract infection, site not specified: Secondary | ICD-10-CM | POA: Diagnosis not present

## 2020-12-02 DIAGNOSIS — N3000 Acute cystitis without hematuria: Secondary | ICD-10-CM | POA: Diagnosis not present

## 2020-12-02 DIAGNOSIS — R531 Weakness: Secondary | ICD-10-CM | POA: Diagnosis not present

## 2020-12-03 DIAGNOSIS — R531 Weakness: Secondary | ICD-10-CM | POA: Diagnosis not present

## 2020-12-03 DIAGNOSIS — N390 Urinary tract infection, site not specified: Secondary | ICD-10-CM | POA: Diagnosis not present

## 2020-12-05 DIAGNOSIS — Z48815 Encounter for surgical aftercare following surgery on the digestive system: Secondary | ICD-10-CM | POA: Diagnosis not present

## 2020-12-05 DIAGNOSIS — H1045 Other chronic allergic conjunctivitis: Secondary | ICD-10-CM | POA: Diagnosis not present

## 2020-12-08 DIAGNOSIS — Z7901 Long term (current) use of anticoagulants: Secondary | ICD-10-CM | POA: Diagnosis not present

## 2020-12-08 DIAGNOSIS — R69 Illness, unspecified: Secondary | ICD-10-CM | POA: Diagnosis not present

## 2020-12-08 DIAGNOSIS — F330 Major depressive disorder, recurrent, mild: Secondary | ICD-10-CM | POA: Diagnosis not present

## 2020-12-11 DIAGNOSIS — S60416A Abrasion of right little finger, initial encounter: Secondary | ICD-10-CM | POA: Diagnosis not present

## 2020-12-15 DIAGNOSIS — R635 Abnormal weight gain: Secondary | ICD-10-CM | POA: Diagnosis not present

## 2020-12-15 DIAGNOSIS — R001 Bradycardia, unspecified: Secondary | ICD-10-CM | POA: Diagnosis not present

## 2020-12-15 DIAGNOSIS — I10 Essential (primary) hypertension: Secondary | ICD-10-CM | POA: Diagnosis not present

## 2020-12-15 DIAGNOSIS — E039 Hypothyroidism, unspecified: Secondary | ICD-10-CM | POA: Diagnosis not present

## 2020-12-16 DIAGNOSIS — R9431 Abnormal electrocardiogram [ECG] [EKG]: Secondary | ICD-10-CM | POA: Diagnosis not present

## 2020-12-16 DIAGNOSIS — I10 Essential (primary) hypertension: Secondary | ICD-10-CM | POA: Diagnosis not present

## 2020-12-22 DIAGNOSIS — B372 Candidiasis of skin and nail: Secondary | ICD-10-CM | POA: Diagnosis not present

## 2020-12-22 DIAGNOSIS — L299 Pruritus, unspecified: Secondary | ICD-10-CM | POA: Diagnosis not present

## 2020-12-24 DIAGNOSIS — F330 Major depressive disorder, recurrent, mild: Secondary | ICD-10-CM | POA: Diagnosis not present

## 2020-12-24 DIAGNOSIS — R69 Illness, unspecified: Secondary | ICD-10-CM | POA: Diagnosis not present

## 2020-12-24 DIAGNOSIS — G4701 Insomnia due to medical condition: Secondary | ICD-10-CM | POA: Diagnosis not present

## 2020-12-25 DIAGNOSIS — E039 Hypothyroidism, unspecified: Secondary | ICD-10-CM | POA: Diagnosis not present

## 2020-12-25 DIAGNOSIS — I10 Essential (primary) hypertension: Secondary | ICD-10-CM | POA: Diagnosis not present

## 2020-12-25 DIAGNOSIS — R635 Abnormal weight gain: Secondary | ICD-10-CM | POA: Diagnosis not present

## 2020-12-30 DIAGNOSIS — I10 Essential (primary) hypertension: Secondary | ICD-10-CM | POA: Diagnosis not present

## 2021-01-01 DIAGNOSIS — Z79899 Other long term (current) drug therapy: Secondary | ICD-10-CM | POA: Diagnosis not present

## 2021-01-01 DIAGNOSIS — I509 Heart failure, unspecified: Secondary | ICD-10-CM | POA: Diagnosis not present

## 2021-01-01 DIAGNOSIS — R7989 Other specified abnormal findings of blood chemistry: Secondary | ICD-10-CM | POA: Diagnosis not present

## 2021-01-06 DIAGNOSIS — R7989 Other specified abnormal findings of blood chemistry: Secondary | ICD-10-CM | POA: Diagnosis not present

## 2021-01-06 DIAGNOSIS — E039 Hypothyroidism, unspecified: Secondary | ICD-10-CM | POA: Diagnosis not present

## 2021-01-13 DIAGNOSIS — H9193 Unspecified hearing loss, bilateral: Secondary | ICD-10-CM | POA: Diagnosis not present

## 2021-01-13 DIAGNOSIS — H6123 Impacted cerumen, bilateral: Secondary | ICD-10-CM | POA: Diagnosis not present

## 2021-01-20 DIAGNOSIS — R58 Hemorrhage, not elsewhere classified: Secondary | ICD-10-CM | POA: Diagnosis not present

## 2021-01-20 DIAGNOSIS — S6991XA Unspecified injury of right wrist, hand and finger(s), initial encounter: Secondary | ICD-10-CM | POA: Diagnosis not present

## 2021-01-20 DIAGNOSIS — R600 Localized edema: Secondary | ICD-10-CM | POA: Diagnosis not present

## 2021-01-21 DIAGNOSIS — R69 Illness, unspecified: Secondary | ICD-10-CM | POA: Diagnosis not present

## 2021-01-21 DIAGNOSIS — F330 Major depressive disorder, recurrent, mild: Secondary | ICD-10-CM | POA: Diagnosis not present

## 2021-01-21 DIAGNOSIS — G4701 Insomnia due to medical condition: Secondary | ICD-10-CM | POA: Diagnosis not present

## 2021-01-26 DIAGNOSIS — E039 Hypothyroidism, unspecified: Secondary | ICD-10-CM | POA: Diagnosis not present

## 2021-01-27 DIAGNOSIS — E039 Hypothyroidism, unspecified: Secondary | ICD-10-CM | POA: Diagnosis not present

## 2021-01-27 DIAGNOSIS — R7989 Other specified abnormal findings of blood chemistry: Secondary | ICD-10-CM | POA: Diagnosis not present

## 2021-01-30 DIAGNOSIS — M25561 Pain in right knee: Secondary | ICD-10-CM | POA: Diagnosis not present

## 2021-01-30 DIAGNOSIS — M1711 Unilateral primary osteoarthritis, right knee: Secondary | ICD-10-CM | POA: Diagnosis not present

## 2021-02-05 DIAGNOSIS — R062 Wheezing: Secondary | ICD-10-CM | POA: Diagnosis not present

## 2021-02-05 DIAGNOSIS — R197 Diarrhea, unspecified: Secondary | ICD-10-CM | POA: Diagnosis not present

## 2021-02-05 DIAGNOSIS — R051 Acute cough: Secondary | ICD-10-CM | POA: Diagnosis not present

## 2021-02-05 DIAGNOSIS — H6123 Impacted cerumen, bilateral: Secondary | ICD-10-CM | POA: Diagnosis not present

## 2021-02-05 DIAGNOSIS — R918 Other nonspecific abnormal finding of lung field: Secondary | ICD-10-CM | POA: Diagnosis not present

## 2021-02-06 DIAGNOSIS — E039 Hypothyroidism, unspecified: Secondary | ICD-10-CM | POA: Diagnosis not present

## 2021-02-06 DIAGNOSIS — R5383 Other fatigue: Secondary | ICD-10-CM | POA: Diagnosis not present

## 2021-02-06 DIAGNOSIS — I2510 Atherosclerotic heart disease of native coronary artery without angina pectoris: Secondary | ICD-10-CM | POA: Diagnosis not present

## 2021-02-06 DIAGNOSIS — I509 Heart failure, unspecified: Secondary | ICD-10-CM | POA: Diagnosis not present

## 2021-02-06 DIAGNOSIS — J690 Pneumonitis due to inhalation of food and vomit: Secondary | ICD-10-CM | POA: Diagnosis not present

## 2021-02-06 DIAGNOSIS — R051 Acute cough: Secondary | ICD-10-CM | POA: Diagnosis not present

## 2021-02-09 DIAGNOSIS — I248 Other forms of acute ischemic heart disease: Secondary | ICD-10-CM | POA: Diagnosis not present

## 2021-02-09 DIAGNOSIS — R062 Wheezing: Secondary | ICD-10-CM | POA: Diagnosis not present

## 2021-02-09 DIAGNOSIS — J690 Pneumonitis due to inhalation of food and vomit: Secondary | ICD-10-CM | POA: Diagnosis not present

## 2021-02-09 DIAGNOSIS — I509 Heart failure, unspecified: Secondary | ICD-10-CM | POA: Diagnosis not present

## 2021-02-09 DIAGNOSIS — R1312 Dysphagia, oropharyngeal phase: Secondary | ICD-10-CM | POA: Diagnosis not present

## 2021-02-09 DIAGNOSIS — I639 Cerebral infarction, unspecified: Secondary | ICD-10-CM | POA: Diagnosis not present

## 2021-02-10 DIAGNOSIS — H6123 Impacted cerumen, bilateral: Secondary | ICD-10-CM | POA: Diagnosis not present

## 2021-02-11 DIAGNOSIS — I639 Cerebral infarction, unspecified: Secondary | ICD-10-CM | POA: Diagnosis not present

## 2021-02-11 DIAGNOSIS — R1312 Dysphagia, oropharyngeal phase: Secondary | ICD-10-CM | POA: Diagnosis not present

## 2021-02-12 DIAGNOSIS — I2510 Atherosclerotic heart disease of native coronary artery without angina pectoris: Secondary | ICD-10-CM | POA: Diagnosis not present

## 2021-02-12 DIAGNOSIS — R7989 Other specified abnormal findings of blood chemistry: Secondary | ICD-10-CM | POA: Diagnosis not present

## 2021-02-12 DIAGNOSIS — E119 Type 2 diabetes mellitus without complications: Secondary | ICD-10-CM | POA: Diagnosis not present

## 2021-02-12 DIAGNOSIS — I509 Heart failure, unspecified: Secondary | ICD-10-CM | POA: Diagnosis not present

## 2021-02-13 DIAGNOSIS — R531 Weakness: Secondary | ICD-10-CM | POA: Diagnosis not present

## 2021-02-13 DIAGNOSIS — J189 Pneumonia, unspecified organism: Secondary | ICD-10-CM | POA: Diagnosis not present

## 2021-02-13 DIAGNOSIS — R1312 Dysphagia, oropharyngeal phase: Secondary | ICD-10-CM | POA: Diagnosis not present

## 2021-02-13 DIAGNOSIS — Z48815 Encounter for surgical aftercare following surgery on the digestive system: Secondary | ICD-10-CM | POA: Diagnosis not present

## 2021-02-13 DIAGNOSIS — I639 Cerebral infarction, unspecified: Secondary | ICD-10-CM | POA: Diagnosis not present

## 2021-02-13 DIAGNOSIS — H1045 Other chronic allergic conjunctivitis: Secondary | ICD-10-CM | POA: Diagnosis not present

## 2021-02-17 DIAGNOSIS — H903 Sensorineural hearing loss, bilateral: Secondary | ICD-10-CM | POA: Diagnosis not present

## 2021-02-18 DIAGNOSIS — F330 Major depressive disorder, recurrent, mild: Secondary | ICD-10-CM | POA: Diagnosis not present

## 2021-02-18 DIAGNOSIS — G4701 Insomnia due to medical condition: Secondary | ICD-10-CM | POA: Diagnosis not present

## 2021-02-18 DIAGNOSIS — R69 Illness, unspecified: Secondary | ICD-10-CM | POA: Diagnosis not present

## 2021-02-24 DIAGNOSIS — I739 Peripheral vascular disease, unspecified: Secondary | ICD-10-CM | POA: Diagnosis not present

## 2021-02-24 DIAGNOSIS — L603 Nail dystrophy: Secondary | ICD-10-CM | POA: Diagnosis not present

## 2021-02-27 DIAGNOSIS — K1379 Other lesions of oral mucosa: Secondary | ICD-10-CM | POA: Diagnosis not present

## 2021-02-27 DIAGNOSIS — Z972 Presence of dental prosthetic device (complete) (partial): Secondary | ICD-10-CM | POA: Diagnosis not present

## 2021-02-27 DIAGNOSIS — K120 Recurrent oral aphthae: Secondary | ICD-10-CM | POA: Diagnosis not present

## 2021-03-16 DIAGNOSIS — L659 Nonscarring hair loss, unspecified: Secondary | ICD-10-CM | POA: Diagnosis not present

## 2021-03-16 DIAGNOSIS — E039 Hypothyroidism, unspecified: Secondary | ICD-10-CM | POA: Diagnosis not present

## 2021-03-16 DIAGNOSIS — L570 Actinic keratosis: Secondary | ICD-10-CM | POA: Diagnosis not present

## 2021-03-16 DIAGNOSIS — L989 Disorder of the skin and subcutaneous tissue, unspecified: Secondary | ICD-10-CM | POA: Diagnosis not present

## 2021-03-18 DIAGNOSIS — F330 Major depressive disorder, recurrent, mild: Secondary | ICD-10-CM | POA: Diagnosis not present

## 2021-03-18 DIAGNOSIS — R69 Illness, unspecified: Secondary | ICD-10-CM | POA: Diagnosis not present

## 2021-03-18 DIAGNOSIS — G4701 Insomnia due to medical condition: Secondary | ICD-10-CM | POA: Diagnosis not present

## 2021-03-20 DIAGNOSIS — D485 Neoplasm of uncertain behavior of skin: Secondary | ICD-10-CM | POA: Diagnosis not present

## 2021-03-20 DIAGNOSIS — C4442 Squamous cell carcinoma of skin of scalp and neck: Secondary | ICD-10-CM | POA: Diagnosis not present

## 2021-04-13 DIAGNOSIS — G4701 Insomnia due to medical condition: Secondary | ICD-10-CM | POA: Diagnosis not present

## 2021-04-13 DIAGNOSIS — R69 Illness, unspecified: Secondary | ICD-10-CM | POA: Diagnosis not present

## 2021-04-13 DIAGNOSIS — F064 Anxiety disorder due to known physiological condition: Secondary | ICD-10-CM | POA: Diagnosis not present

## 2021-04-14 DIAGNOSIS — R519 Headache, unspecified: Secondary | ICD-10-CM | POA: Diagnosis not present

## 2021-04-14 DIAGNOSIS — L989 Disorder of the skin and subcutaneous tissue, unspecified: Secondary | ICD-10-CM | POA: Diagnosis not present

## 2021-04-15 DIAGNOSIS — R531 Weakness: Secondary | ICD-10-CM | POA: Diagnosis not present

## 2021-04-21 DIAGNOSIS — S81801A Unspecified open wound, right lower leg, initial encounter: Secondary | ICD-10-CM | POA: Diagnosis not present

## 2021-04-21 DIAGNOSIS — M79604 Pain in right leg: Secondary | ICD-10-CM | POA: Diagnosis not present

## 2021-04-25 DIAGNOSIS — R062 Wheezing: Secondary | ICD-10-CM | POA: Diagnosis not present

## 2021-04-28 DIAGNOSIS — S81801D Unspecified open wound, right lower leg, subsequent encounter: Secondary | ICD-10-CM | POA: Diagnosis not present

## 2021-04-28 DIAGNOSIS — R053 Chronic cough: Secondary | ICD-10-CM | POA: Diagnosis not present

## 2021-05-04 DIAGNOSIS — J3089 Other allergic rhinitis: Secondary | ICD-10-CM | POA: Diagnosis not present

## 2021-05-04 DIAGNOSIS — R067 Sneezing: Secondary | ICD-10-CM | POA: Diagnosis not present

## 2021-05-04 DIAGNOSIS — R053 Chronic cough: Secondary | ICD-10-CM | POA: Diagnosis not present

## 2021-05-11 DIAGNOSIS — F064 Anxiety disorder due to known physiological condition: Secondary | ICD-10-CM | POA: Diagnosis not present

## 2021-05-11 DIAGNOSIS — G4701 Insomnia due to medical condition: Secondary | ICD-10-CM | POA: Diagnosis not present

## 2021-05-11 DIAGNOSIS — R69 Illness, unspecified: Secondary | ICD-10-CM | POA: Diagnosis not present

## 2021-05-11 DIAGNOSIS — F331 Major depressive disorder, recurrent, moderate: Secondary | ICD-10-CM | POA: Diagnosis not present

## 2021-05-12 DIAGNOSIS — D044 Carcinoma in situ of skin of scalp and neck: Secondary | ICD-10-CM | POA: Diagnosis not present

## 2021-05-12 DIAGNOSIS — L578 Other skin changes due to chronic exposure to nonionizing radiation: Secondary | ICD-10-CM | POA: Diagnosis not present

## 2021-05-12 DIAGNOSIS — L570 Actinic keratosis: Secondary | ICD-10-CM | POA: Diagnosis not present

## 2021-05-20 DIAGNOSIS — R053 Chronic cough: Secondary | ICD-10-CM | POA: Diagnosis not present

## 2021-05-20 DIAGNOSIS — R0602 Shortness of breath: Secondary | ICD-10-CM | POA: Diagnosis not present

## 2021-05-20 DIAGNOSIS — R062 Wheezing: Secondary | ICD-10-CM | POA: Diagnosis not present

## 2021-05-22 DIAGNOSIS — R051 Acute cough: Secondary | ICD-10-CM | POA: Diagnosis not present

## 2021-05-25 DIAGNOSIS — R5383 Other fatigue: Secondary | ICD-10-CM | POA: Diagnosis not present

## 2021-05-25 DIAGNOSIS — I10 Essential (primary) hypertension: Secondary | ICD-10-CM | POA: Diagnosis not present

## 2021-06-02 DIAGNOSIS — M79604 Pain in right leg: Secondary | ICD-10-CM | POA: Diagnosis not present

## 2021-06-02 DIAGNOSIS — S81801D Unspecified open wound, right lower leg, subsequent encounter: Secondary | ICD-10-CM | POA: Diagnosis not present

## 2021-06-03 DIAGNOSIS — I2510 Atherosclerotic heart disease of native coronary artery without angina pectoris: Secondary | ICD-10-CM | POA: Diagnosis not present

## 2021-06-03 DIAGNOSIS — I509 Heart failure, unspecified: Secondary | ICD-10-CM | POA: Diagnosis not present

## 2021-06-08 DIAGNOSIS — R69 Illness, unspecified: Secondary | ICD-10-CM | POA: Diagnosis not present

## 2021-06-08 DIAGNOSIS — F064 Anxiety disorder due to known physiological condition: Secondary | ICD-10-CM | POA: Diagnosis not present

## 2021-06-08 DIAGNOSIS — G4701 Insomnia due to medical condition: Secondary | ICD-10-CM | POA: Diagnosis not present

## 2021-06-12 DIAGNOSIS — R0609 Other forms of dyspnea: Secondary | ICD-10-CM | POA: Diagnosis not present

## 2021-06-12 DIAGNOSIS — R0602 Shortness of breath: Secondary | ICD-10-CM | POA: Diagnosis not present

## 2021-06-12 DIAGNOSIS — J418 Mixed simple and mucopurulent chronic bronchitis: Secondary | ICD-10-CM | POA: Diagnosis not present

## 2021-06-12 DIAGNOSIS — R053 Chronic cough: Secondary | ICD-10-CM | POA: Diagnosis not present

## 2021-06-15 DIAGNOSIS — J441 Chronic obstructive pulmonary disease with (acute) exacerbation: Secondary | ICD-10-CM | POA: Diagnosis not present

## 2021-06-15 DIAGNOSIS — R053 Chronic cough: Secondary | ICD-10-CM | POA: Diagnosis not present

## 2021-06-15 DIAGNOSIS — R0602 Shortness of breath: Secondary | ICD-10-CM | POA: Diagnosis not present

## 2021-06-15 DIAGNOSIS — R0609 Other forms of dyspnea: Secondary | ICD-10-CM | POA: Diagnosis not present

## 2021-06-19 DIAGNOSIS — J418 Mixed simple and mucopurulent chronic bronchitis: Secondary | ICD-10-CM | POA: Diagnosis not present

## 2021-06-19 DIAGNOSIS — R0602 Shortness of breath: Secondary | ICD-10-CM | POA: Diagnosis not present

## 2021-06-19 DIAGNOSIS — R053 Chronic cough: Secondary | ICD-10-CM | POA: Diagnosis not present

## 2021-06-22 DIAGNOSIS — R69 Illness, unspecified: Secondary | ICD-10-CM | POA: Diagnosis not present

## 2021-06-23 DIAGNOSIS — Z91198 Patient's noncompliance with other medical treatment and regimen for other reason: Secondary | ICD-10-CM | POA: Diagnosis not present

## 2021-06-23 DIAGNOSIS — Z87440 Personal history of urinary (tract) infections: Secondary | ICD-10-CM | POA: Diagnosis not present

## 2021-06-23 DIAGNOSIS — Z7689 Persons encountering health services in other specified circumstances: Secondary | ICD-10-CM | POA: Diagnosis not present

## 2021-07-06 DIAGNOSIS — F064 Anxiety disorder due to known physiological condition: Secondary | ICD-10-CM | POA: Diagnosis not present

## 2021-07-06 DIAGNOSIS — R69 Illness, unspecified: Secondary | ICD-10-CM | POA: Diagnosis not present

## 2021-07-07 DIAGNOSIS — R69 Illness, unspecified: Secondary | ICD-10-CM | POA: Diagnosis not present

## 2021-07-07 DIAGNOSIS — R451 Restlessness and agitation: Secondary | ICD-10-CM | POA: Diagnosis not present

## 2021-07-07 DIAGNOSIS — R410 Disorientation, unspecified: Secondary | ICD-10-CM | POA: Diagnosis not present

## 2021-07-08 DIAGNOSIS — Z79899 Other long term (current) drug therapy: Secondary | ICD-10-CM | POA: Diagnosis not present

## 2021-07-08 DIAGNOSIS — B9620 Unspecified Escherichia coli [E. coli] as the cause of diseases classified elsewhere: Secondary | ICD-10-CM | POA: Diagnosis not present

## 2021-07-08 DIAGNOSIS — R410 Disorientation, unspecified: Secondary | ICD-10-CM | POA: Diagnosis not present

## 2021-07-08 DIAGNOSIS — N390 Urinary tract infection, site not specified: Secondary | ICD-10-CM | POA: Diagnosis not present

## 2021-07-08 DIAGNOSIS — I639 Cerebral infarction, unspecified: Secondary | ICD-10-CM | POA: Diagnosis not present

## 2021-07-08 DIAGNOSIS — I509 Heart failure, unspecified: Secondary | ICD-10-CM | POA: Diagnosis not present

## 2021-07-08 DIAGNOSIS — I69320 Aphasia following cerebral infarction: Secondary | ICD-10-CM | POA: Diagnosis not present

## 2021-07-08 DIAGNOSIS — R69 Illness, unspecified: Secondary | ICD-10-CM | POA: Diagnosis not present

## 2021-07-08 DIAGNOSIS — N3000 Acute cystitis without hematuria: Secondary | ICD-10-CM | POA: Diagnosis not present

## 2021-07-13 DIAGNOSIS — R410 Disorientation, unspecified: Secondary | ICD-10-CM | POA: Diagnosis not present

## 2021-07-13 DIAGNOSIS — N3000 Acute cystitis without hematuria: Secondary | ICD-10-CM | POA: Diagnosis not present

## 2021-07-13 DIAGNOSIS — R69 Illness, unspecified: Secondary | ICD-10-CM | POA: Diagnosis not present

## 2021-07-14 DIAGNOSIS — L578 Other skin changes due to chronic exposure to nonionizing radiation: Secondary | ICD-10-CM | POA: Diagnosis not present

## 2021-07-14 DIAGNOSIS — L244 Irritant contact dermatitis due to drugs in contact with skin: Secondary | ICD-10-CM | POA: Diagnosis not present

## 2021-07-15 DIAGNOSIS — N390 Urinary tract infection, site not specified: Secondary | ICD-10-CM | POA: Diagnosis not present

## 2021-07-15 DIAGNOSIS — Z87440 Personal history of urinary (tract) infections: Secondary | ICD-10-CM | POA: Diagnosis not present

## 2021-07-15 DIAGNOSIS — R410 Disorientation, unspecified: Secondary | ICD-10-CM | POA: Diagnosis not present

## 2021-07-18 DIAGNOSIS — I517 Cardiomegaly: Secondary | ICD-10-CM | POA: Diagnosis not present

## 2021-07-19 ENCOUNTER — Other Ambulatory Visit: Payer: Self-pay

## 2021-07-19 ENCOUNTER — Inpatient Hospital Stay (HOSPITAL_COMMUNITY)
Admission: EM | Admit: 2021-07-19 | Discharge: 2021-07-28 | DRG: 193 | Disposition: A | Discharge status: Skilled Nursing Facility | Payer: Medicare HMO | Source: Skilled Nursing Facility | Attending: Internal Medicine | Admitting: Internal Medicine

## 2021-07-19 ENCOUNTER — Emergency Department (HOSPITAL_COMMUNITY): Payer: Medicare HMO

## 2021-07-19 ENCOUNTER — Encounter (HOSPITAL_COMMUNITY): Payer: Self-pay | Admitting: Internal Medicine

## 2021-07-19 DIAGNOSIS — N1831 Chronic kidney disease, stage 3a: Secondary | ICD-10-CM | POA: Diagnosis present

## 2021-07-19 DIAGNOSIS — L89322 Pressure ulcer of left buttock, stage 2: Secondary | ICD-10-CM | POA: Diagnosis present

## 2021-07-19 DIAGNOSIS — Z8673 Personal history of transient ischemic attack (TIA), and cerebral infarction without residual deficits: Secondary | ICD-10-CM

## 2021-07-19 DIAGNOSIS — J1000 Influenza due to other identified influenza virus with unspecified type of pneumonia: Principal | ICD-10-CM | POA: Diagnosis present

## 2021-07-19 DIAGNOSIS — Z9081 Acquired absence of spleen: Secondary | ICD-10-CM

## 2021-07-19 DIAGNOSIS — G9341 Metabolic encephalopathy: Secondary | ICD-10-CM | POA: Diagnosis present

## 2021-07-19 DIAGNOSIS — J111 Influenza due to unidentified influenza virus with other respiratory manifestations: Secondary | ICD-10-CM

## 2021-07-19 DIAGNOSIS — R0902 Hypoxemia: Secondary | ICD-10-CM | POA: Diagnosis not present

## 2021-07-19 DIAGNOSIS — R001 Bradycardia, unspecified: Secondary | ICD-10-CM | POA: Diagnosis not present

## 2021-07-19 DIAGNOSIS — J9601 Acute respiratory failure with hypoxia: Secondary | ICD-10-CM | POA: Diagnosis present

## 2021-07-19 DIAGNOSIS — E039 Hypothyroidism, unspecified: Secondary | ICD-10-CM | POA: Diagnosis present

## 2021-07-19 DIAGNOSIS — R070 Pain in throat: Secondary | ICD-10-CM | POA: Diagnosis not present

## 2021-07-19 DIAGNOSIS — J441 Chronic obstructive pulmonary disease with (acute) exacerbation: Secondary | ICD-10-CM | POA: Diagnosis present

## 2021-07-19 DIAGNOSIS — R0602 Shortness of breath: Secondary | ICD-10-CM

## 2021-07-19 DIAGNOSIS — Z7982 Long term (current) use of aspirin: Secondary | ICD-10-CM

## 2021-07-19 DIAGNOSIS — Z91013 Allergy to seafood: Secondary | ICD-10-CM

## 2021-07-19 DIAGNOSIS — I10 Essential (primary) hypertension: Secondary | ICD-10-CM | POA: Diagnosis present

## 2021-07-19 DIAGNOSIS — Z66 Do not resuscitate: Secondary | ICD-10-CM | POA: Diagnosis present

## 2021-07-19 DIAGNOSIS — N182 Chronic kidney disease, stage 2 (mild): Secondary | ICD-10-CM | POA: Diagnosis present

## 2021-07-19 DIAGNOSIS — E785 Hyperlipidemia, unspecified: Secondary | ICD-10-CM | POA: Diagnosis present

## 2021-07-19 DIAGNOSIS — J9600 Acute respiratory failure, unspecified whether with hypoxia or hypercapnia: Secondary | ICD-10-CM | POA: Diagnosis present

## 2021-07-19 DIAGNOSIS — R0689 Other abnormalities of breathing: Secondary | ICD-10-CM | POA: Diagnosis not present

## 2021-07-19 DIAGNOSIS — Z90710 Acquired absence of both cervix and uterus: Secondary | ICD-10-CM

## 2021-07-19 DIAGNOSIS — L89152 Pressure ulcer of sacral region, stage 2: Secondary | ICD-10-CM | POA: Diagnosis present

## 2021-07-19 DIAGNOSIS — I2510 Atherosclerotic heart disease of native coronary artery without angina pectoris: Secondary | ICD-10-CM | POA: Diagnosis present

## 2021-07-19 DIAGNOSIS — Z515 Encounter for palliative care: Secondary | ICD-10-CM

## 2021-07-19 DIAGNOSIS — Z9049 Acquired absence of other specified parts of digestive tract: Secondary | ICD-10-CM

## 2021-07-19 DIAGNOSIS — R4182 Altered mental status, unspecified: Secondary | ICD-10-CM | POA: Diagnosis not present

## 2021-07-19 DIAGNOSIS — I4892 Unspecified atrial flutter: Secondary | ICD-10-CM | POA: Diagnosis present

## 2021-07-19 DIAGNOSIS — Z743 Need for continuous supervision: Secondary | ICD-10-CM | POA: Diagnosis not present

## 2021-07-19 DIAGNOSIS — Z885 Allergy status to narcotic agent status: Secondary | ICD-10-CM

## 2021-07-19 DIAGNOSIS — F419 Anxiety disorder, unspecified: Secondary | ICD-10-CM | POA: Diagnosis not present

## 2021-07-19 DIAGNOSIS — Z881 Allergy status to other antibiotic agents status: Secondary | ICD-10-CM

## 2021-07-19 DIAGNOSIS — I959 Hypotension, unspecified: Secondary | ICD-10-CM | POA: Diagnosis not present

## 2021-07-19 DIAGNOSIS — H9190 Unspecified hearing loss, unspecified ear: Secondary | ICD-10-CM | POA: Diagnosis present

## 2021-07-19 DIAGNOSIS — Z823 Family history of stroke: Secondary | ICD-10-CM

## 2021-07-19 DIAGNOSIS — R062 Wheezing: Secondary | ICD-10-CM | POA: Diagnosis not present

## 2021-07-19 DIAGNOSIS — Z8249 Family history of ischemic heart disease and other diseases of the circulatory system: Secondary | ICD-10-CM

## 2021-07-19 DIAGNOSIS — Z79899 Other long term (current) drug therapy: Secondary | ICD-10-CM

## 2021-07-19 DIAGNOSIS — I517 Cardiomegaly: Secondary | ICD-10-CM | POA: Diagnosis not present

## 2021-07-19 DIAGNOSIS — Z20822 Contact with and (suspected) exposure to covid-19: Secondary | ICD-10-CM | POA: Diagnosis present

## 2021-07-19 DIAGNOSIS — R9431 Abnormal electrocardiogram [ECG] [EKG]: Secondary | ICD-10-CM | POA: Diagnosis not present

## 2021-07-19 DIAGNOSIS — I129 Hypertensive chronic kidney disease with stage 1 through stage 4 chronic kidney disease, or unspecified chronic kidney disease: Secondary | ICD-10-CM | POA: Diagnosis present

## 2021-07-19 DIAGNOSIS — Z888 Allergy status to other drugs, medicaments and biological substances status: Secondary | ICD-10-CM

## 2021-07-19 DIAGNOSIS — R269 Unspecified abnormalities of gait and mobility: Secondary | ICD-10-CM | POA: Diagnosis present

## 2021-07-19 DIAGNOSIS — N179 Acute kidney failure, unspecified: Secondary | ICD-10-CM | POA: Diagnosis present

## 2021-07-19 HISTORY — DX: Chronic obstructive pulmonary disease with (acute) exacerbation: J44.1

## 2021-07-19 LAB — BLOOD GAS, VENOUS
Acid-Base Excess: 0.6 mmol/L (ref 0.0–2.0)
Bicarbonate: 25.8 mmol/L (ref 20.0–28.0)
FIO2: 40
O2 Saturation: 85.6 %
Patient temperature: 37
pCO2, Ven: 49.9 mmHg (ref 44.0–60.0)
pH, Ven: 7.333 (ref 7.250–7.430)
pO2, Ven: 56 mmHg — ABNORMAL HIGH (ref 32.0–45.0)

## 2021-07-19 LAB — CBC WITH DIFFERENTIAL/PLATELET
Abs Immature Granulocytes: 0.07 10*3/uL (ref 0.00–0.07)
Basophils Absolute: 0 10*3/uL (ref 0.0–0.1)
Basophils Relative: 1 %
Eosinophils Absolute: 0.2 10*3/uL (ref 0.0–0.5)
Eosinophils Relative: 2 %
HCT: 44.6 % (ref 36.0–46.0)
Hemoglobin: 15 g/dL (ref 12.0–15.0)
Immature Granulocytes: 1 %
Lymphocytes Relative: 17 %
Lymphs Abs: 1.1 10*3/uL (ref 0.7–4.0)
MCH: 31.5 pg (ref 26.0–34.0)
MCHC: 33.6 g/dL (ref 30.0–36.0)
MCV: 93.7 fL (ref 80.0–100.0)
Monocytes Absolute: 1.1 10*3/uL — ABNORMAL HIGH (ref 0.1–1.0)
Monocytes Relative: 16 %
Neutro Abs: 4.2 10*3/uL (ref 1.7–7.7)
Neutrophils Relative %: 63 %
Platelets: 224 10*3/uL (ref 150–400)
RBC: 4.76 MIL/uL (ref 3.87–5.11)
RDW: 15.9 % — ABNORMAL HIGH (ref 11.5–15.5)
WBC: 6.6 10*3/uL (ref 4.0–10.5)
nRBC: 0 % (ref 0.0–0.2)

## 2021-07-19 LAB — COMPREHENSIVE METABOLIC PANEL
ALT: 15 U/L (ref 0–44)
AST: 41 U/L (ref 15–41)
Albumin: 3.5 g/dL (ref 3.5–5.0)
Alkaline Phosphatase: 73 U/L (ref 38–126)
Anion gap: 12 (ref 5–15)
BUN: 25 mg/dL — ABNORMAL HIGH (ref 8–23)
CO2: 28 mmol/L (ref 22–32)
Calcium: 10 mg/dL (ref 8.9–10.3)
Chloride: 96 mmol/L — ABNORMAL LOW (ref 98–111)
Creatinine, Ser: 1.62 mg/dL — ABNORMAL HIGH (ref 0.44–1.00)
GFR, Estimated: 30 mL/min — ABNORMAL LOW (ref >60)
Glucose, Bld: 114 mg/dL — ABNORMAL HIGH (ref 70–99)
Potassium: 4 mmol/L (ref 3.5–5.1)
Sodium: 136 mmol/L (ref 135–145)
Total Bilirubin: 0.8 mg/dL (ref 0.3–1.2)
Total Protein: 6.3 g/dL — ABNORMAL LOW (ref 6.5–8.1)

## 2021-07-19 LAB — BRAIN NATRIURETIC PEPTIDE: B Natriuretic Peptide: 481.7 pg/mL — ABNORMAL HIGH (ref 0.0–100.0)

## 2021-07-19 LAB — RESP PANEL BY RT-PCR (FLU A&B, COVID) ARPGX2
Influenza A by PCR: POSITIVE — AB
Influenza B by PCR: NEGATIVE
SARS Coronavirus 2 by RT PCR: NEGATIVE

## 2021-07-19 MED ORDER — GUAIFENESIN ER 600 MG PO TB12
600.0000 mg | ORAL_TABLET | Freq: Two times a day (BID) | ORAL | Status: DC
  Administered 2021-07-19 – 2021-07-28 (×18): 600 mg via ORAL
  Filled 2021-07-19 (×18): qty 1

## 2021-07-19 MED ORDER — ACETAMINOPHEN 325 MG PO TABS
650.0000 mg | ORAL_TABLET | Freq: Four times a day (QID) | ORAL | Status: DC | PRN
  Administered 2021-07-19 – 2021-07-24 (×9): 650 mg via ORAL
  Filled 2021-07-19 (×9): qty 2

## 2021-07-19 MED ORDER — DICLOFENAC SODIUM 1 % EX GEL
2.0000 g | Freq: Three times a day (TID) | CUTANEOUS | Status: DC
  Administered 2021-07-19 – 2021-07-28 (×26): 2 g via TOPICAL
  Filled 2021-07-19: qty 100

## 2021-07-19 MED ORDER — PANTOPRAZOLE SODIUM 20 MG PO TBEC
20.0000 mg | DELAYED_RELEASE_TABLET | Freq: Every day | ORAL | Status: DC
  Administered 2021-07-19 – 2021-07-28 (×10): 20 mg via ORAL
  Filled 2021-07-19 (×10): qty 1

## 2021-07-19 MED ORDER — METHYLPREDNISOLONE SODIUM SUCC 125 MG IJ SOLR
125.0000 mg | Freq: Once | INTRAMUSCULAR | Status: AC
  Administered 2021-07-19: 15:00:00 125 mg via INTRAVENOUS
  Filled 2021-07-19: qty 2

## 2021-07-19 MED ORDER — METHYLPREDNISOLONE SODIUM SUCC 125 MG IJ SOLR
120.0000 mg | INTRAMUSCULAR | Status: DC
Start: 2021-07-20 — End: 2021-07-22
  Administered 2021-07-20 – 2021-07-22 (×3): 120 mg via INTRAVENOUS
  Filled 2021-07-19 (×3): qty 2

## 2021-07-19 MED ORDER — ALLOPURINOL 100 MG PO TABS
100.0000 mg | ORAL_TABLET | Freq: Every day | ORAL | Status: DC
  Administered 2021-07-20 – 2021-07-28 (×9): 100 mg via ORAL
  Filled 2021-07-19 (×10): qty 1

## 2021-07-19 MED ORDER — ALBUTEROL SULFATE (2.5 MG/3ML) 0.083% IN NEBU
2.5000 mg | INHALATION_SOLUTION | RESPIRATORY_TRACT | Status: DC | PRN
  Administered 2021-07-22 – 2021-07-28 (×5): 2.5 mg via RESPIRATORY_TRACT
  Filled 2021-07-19 (×6): qty 3

## 2021-07-19 MED ORDER — ONDANSETRON HCL 4 MG PO TABS: 4.0000 mg | ORAL_TABLET | Freq: Four times a day (QID) | ORAL | Status: DC | PRN

## 2021-07-19 MED ORDER — PREGABALIN 50 MG PO CAPS
50.0000 mg | ORAL_CAPSULE | Freq: Every day | ORAL | Status: DC
  Administered 2021-07-20 – 2021-07-28 (×9): 50 mg via ORAL
  Filled 2021-07-19 (×10): qty 1

## 2021-07-19 MED ORDER — LACTATED RINGERS IV SOLN: INTRAVENOUS | Status: DC

## 2021-07-19 MED ORDER — LABETALOL HCL 100 MG PO TABS
50.0000 mg | ORAL_TABLET | Freq: Two times a day (BID) | ORAL | Status: DC
  Administered 2021-07-19 – 2021-07-27 (×16): 50 mg via ORAL
  Filled 2021-07-19 (×21): qty 0.5

## 2021-07-19 MED ORDER — ALPRAZOLAM 0.25 MG PO TABS
0.2500 mg | ORAL_TABLET | Freq: Every evening | ORAL | Status: DC | PRN
  Administered 2021-07-19 – 2021-07-23 (×4): 0.25 mg via ORAL
  Filled 2021-07-19 (×4): qty 1

## 2021-07-19 MED ORDER — SODIUM CHLORIDE 0.9 % IV BOLUS
500.0000 mL | Freq: Once | INTRAVENOUS | Status: AC
  Administered 2021-07-19: 17:00:00 500 mL via INTRAVENOUS

## 2021-07-19 MED ORDER — ACETAMINOPHEN 650 MG RE SUPP: 650.0000 mg | Freq: Four times a day (QID) | RECTAL | Status: DC | PRN

## 2021-07-19 MED ORDER — LEVOTHYROXINE SODIUM 75 MCG PO TABS
75.0000 ug | ORAL_TABLET | Freq: Every day | ORAL | Status: DC
  Administered 2021-07-20 – 2021-07-21 (×2): 75 ug via ORAL
  Filled 2021-07-19 (×2): qty 1

## 2021-07-19 MED ORDER — SERTRALINE HCL 25 MG PO TABS
25.0000 mg | ORAL_TABLET | Freq: Every day | ORAL | Status: DC
  Administered 2021-07-20 – 2021-07-28 (×9): 25 mg via ORAL
  Filled 2021-07-19 (×10): qty 1

## 2021-07-19 MED ORDER — BENZONATATE 100 MG PO CAPS
200.0000 mg | ORAL_CAPSULE | Freq: Three times a day (TID) | ORAL | Status: DC | PRN
  Administered 2021-07-20 – 2021-07-22 (×2): 200 mg via ORAL
  Filled 2021-07-19 (×2): qty 2

## 2021-07-19 MED ORDER — OSELTAMIVIR PHOSPHATE 30 MG PO CAPS
30.0000 mg | ORAL_CAPSULE | Freq: Every day | ORAL | Status: AC
  Administered 2021-07-19 – 2021-07-23 (×5): 30 mg via ORAL
  Filled 2021-07-19 (×5): qty 1

## 2021-07-19 MED ORDER — ENOXAPARIN SODIUM 30 MG/0.3ML IJ SOSY
30.0000 mg | PREFILLED_SYRINGE | INTRAMUSCULAR | Status: DC
  Administered 2021-07-19 – 2021-07-27 (×9): 30 mg via SUBCUTANEOUS
  Filled 2021-07-19 (×9): qty 0.3

## 2021-07-19 MED ORDER — IPRATROPIUM-ALBUTEROL 0.5-2.5 (3) MG/3ML IN SOLN
3.0000 mL | Freq: Once | RESPIRATORY_TRACT | Status: AC
  Administered 2021-07-19: 15:00:00 3 mL via RESPIRATORY_TRACT
  Filled 2021-07-19: qty 3

## 2021-07-19 MED ORDER — ASPIRIN EC 81 MG PO TBEC
81.0000 mg | DELAYED_RELEASE_TABLET | Freq: Every day | ORAL | Status: DC
  Administered 2021-07-20 – 2021-07-28 (×9): 81 mg via ORAL
  Filled 2021-07-19 (×10): qty 1

## 2021-07-19 MED ORDER — IPRATROPIUM-ALBUTEROL 0.5-2.5 (3) MG/3ML IN SOLN
3.0000 mL | Freq: Four times a day (QID) | RESPIRATORY_TRACT | Status: DC
Start: 2021-07-20 — End: 2021-07-20
  Administered 2021-07-20: 3 mL via RESPIRATORY_TRACT
  Filled 2021-07-19: qty 3

## 2021-07-19 MED ORDER — ALBUTEROL SULFATE (2.5 MG/3ML) 0.083% IN NEBU: 2.5000 mg | INHALATION_SOLUTION | Freq: Four times a day (QID) | RESPIRATORY_TRACT | Status: DC

## 2021-07-19 MED ORDER — IPRATROPIUM BROMIDE 0.02 % IN SOLN: 0.5000 mg | Freq: Four times a day (QID) | RESPIRATORY_TRACT | Status: DC

## 2021-07-19 MED ORDER — NITROFURANTOIN MACROCRYSTAL 100 MG PO CAPS: 100.0000 mg | ORAL_CAPSULE | Freq: Two times a day (BID) | ORAL | Status: DC

## 2021-07-19 MED ORDER — OMEGA-3-ACID ETHYL ESTERS 1 G PO CAPS
1.0000 g | ORAL_CAPSULE | ORAL | Status: DC
  Administered 2021-07-21 – 2021-07-27 (×4): 1 g via ORAL
  Filled 2021-07-19 (×4): qty 1

## 2021-07-19 MED ORDER — FISH OIL 1000 MG PO CAPS: 1000.0000 mg | ORAL_CAPSULE | ORAL | Status: DC

## 2021-07-19 MED ORDER — ONDANSETRON HCL 4 MG/2ML IJ SOLN: 4.0000 mg | Freq: Four times a day (QID) | INTRAMUSCULAR | Status: DC | PRN

## 2021-07-19 NOTE — ED Provider Notes (Signed)
87-year-old female presenting from nursing facility with altered mental status.  Found to be hypoxic to 85% on room air.  Placed on 3 L via nasal cannula.  ROS positive for harsh nonproductive cough.  Otherwise afebrile, hemodynamic stable.  Patient given duo nebs, steroids for presumed COPD exacerbation.  CT head showed no acute intracranial findings. Labs remarkable for AKI.  Influenza test positive.  Will admit for further evaluation management of hypoxic respiratory failure in setting of influenza.  Nursing team contacted for admission.   Idamae Lusher, MD 07/19/21 1721    Drenda Freeze, MD 07/19/21 2220

## 2021-07-19 NOTE — H&P (Signed)
History and Physical    HADASAH BRUGGER OQH:[id number] DOB: 07/29/1929 DOA: 07/19/2021  PCP: Garwin Brothers, MD   Patient coming from: [place]  Chief Complaint  Patient presents with   Altered Mental Status   HPI: [name] is a 87 y.o. female with medical history significant for HTN/HLD/history of stroke, CAD, CKD stage II, sent from [facility] facility for evaluation of altered mental status. Per EMS and baseline AAO x4 but was confused today, on arrival for EMS patient was alert to person place date and events, was having constant cough dry hacking and also hypoxic 85 percent on room air.  Apparently she is in constant nebulizer at the facility.  In the ED afebrile, again was hypoxic 85% placed on 3 L nasal cannula with improvement, labs revealed stable CBC but creatinine elevated 1.6, influenza came back positive, chest x-ray no acute finding CT head with old left MCA stroke.  Patient was tachypneic and wheezing placed on nebulizer bronchodilators Tamiflu and admission requested  Assessment/Plan  Acute hypoxic respiratory failure Influenza A infection: Reactive airway disease/Chronic Bronchitis-non smoker: Patient needing supplemental oxygen,having wheezing and coughing in the ED, mostly. We will cont on nebulizer, Solu-Medrol supplemental oxygen, Tamiflu -renal dosing per pharmacy with droplet precaution.  Acute metabolic encephalopathy:resolved. Patient is AA0 to self place, dob, situations. Close to baseline. Ct had no acute finding.  We will keep the patient on delerium  precaution fall precaution supportive care  Hypertension CAD HLD History of stroke ?? A flutter on EKG: Blood pressure well controlled.No chest pain. Check TSH repeat EKG monitor in telemetry-no previous history of a flutter.We will resume her home aspirin and antihypertensives once med rec completed.  Hypothyroidism- resume synthroid  AKI on CKD TW:SFKCLEXN creatinine 0.8 04/17/20: Keep on gentle IV  fluid hydration minimize nephrotoxic medication  Ambulatory dysfunction-has knee issues,does not walk.  Medrec pending- resume home meds except ACEI/Lasix/nsaid ocne med rec done  There is no height or weight on file to calculate BMI.   Severity of Illness:The appropriate patient status for this patient is OBSERVATION. Observation status is judged to be reasonable and necessary in order to provide the required intensity of service to ensure the patient's safety. The patient's presenting symptoms, physical exam findings, and initial radiographic and laboratory data in the context of their medical condition is felt to place them at decreased risk for further clinical deterioration. Furthermore, it is anticipated that the patient will be medically stable for discharge from the hospital within 2 midnights of admission.    DVT prophylaxis:enoxaparin (LOVENOX) injection 40 mg Start: 07/19/21 1730 Code Status:   Code Status: DNR , came in with yellow DNR form from facility. Family Communication: Admission, patients condition and plan of care including tests being ordered have been discussed with the patient and son [name] over the phone:who indicate understanding and agree with the plan and Code Status.  Consults called:  None  Review of Systems: All systems were reviewed and were negative except as mentioned in HPI above. Negative for fever Negative for focal weakness  Past Medical History:  Diagnosis Date   Arthritis    CAD (coronary artery disease)    a. Mod prox LAD & first diagonal stenosis by cath 2002 - managed medically since that time with normal LV function.   Cataract    COPD with acute exacerbation ([place]) 07/19/2021   Hyperlipidemia    Hypertension     Past Surgical History:  Procedure Laterality Date   ABDOMINAL HYSTERECTOMY  APPENDECTOMY     BACK SURGERY     CARPAL TUNNEL RELEASE     CHOLECYSTECTOMY N/A 06/19/2013   Procedure: LAPAROSCOPIC CHOLECYSTECTOMY;  Surgeon:  Harl Bowie, MD;  Location: [facility];  Service: General;  Laterality: N/A;   ESOPHAGOGASTRODUODENOSCOPY N/A 04/26/2017   Procedure: ESOPHAGOGASTRODUODENOSCOPY (EGD) W/ DILATION;  Surgeon: Ronnette Juniper, MD;  [facility];  Service: Gastroenterology;  Laterality: N/A;   HERNIA REPAIR     INSERTION OF MESH  05/19/2018   Procedure: INSERTION OF MESH;  Surgeon: Excell Seltzer, MD;  Location: [facility];  Service: General;;   IR GENERIC HISTORICAL  03/18/2016   IR RADIOLOGIST EVAL & MGMT 03/18/2016 Corrie Mckusick, DO [facility] INTERV RAD   KNEE SURGERY     SHOULDER SURGERY     SPLENECTOMY, TOTAL     VENTRAL HERNIA REPAIR N/A 05/19/2018   Procedure: HERNIA REPAIR VENTRAL ADULT;  Surgeon: Excell Seltzer, MD;  Location[facility];  Service: General;  Laterality: N/A;   reports that she has never smoked. She has never used smokeless tobacco. She reports that she does not drink alcohol and does not use drugs.  Allergies  Allergen Reactions   Felodipine Anaphylaxis and Other (See Comments)    Reaction: unknown possible nausea or rash   Prednisone Other (See Comments)    Unknown reaction   Shrimp [Shellfish Allergy] Nausea And Vomiting   Codeine    Crestor [Rosuvastatin]    Feldene [Piroxicam]    Lescol [Fluvastatin]    Lipitor [Atorvastatin]    Nexium [Esomeprazole]    Pravachol [Pravastatin]    Prilosec Otc [Omeprazole Magnesium]    Statins Other (See Comments)    Muscle/leg pain.   Welchol [Colesevelam]    Zetia [Ezetimibe]    Zithromax [Azithromycin]    Zocor [Simvastatin]    Omeprazole Other (See Comments)    Reaction: unknown possible nausea or rash    Family History  Problem Relation Age of Onset   Heart disease Mother        "enlarged valve"   Cancer Mother        Ovarian   Stroke Father    Cancer Sister        Colon   Prior to Admission medications   Medication Sig Start Date End Date Taking? Authorizing Provider  allopurinol (ZYLOPRIM) 100 MG tablet Take 100 mg by  mouth daily.    [provider]  aspirin EC 81 MG tablet Take 1 tablet (81 mg total) by mouth daily. Swallow whole. 05/19/20   Frann Rider, NP  labetalol (NORMODYNE) 100 MG tablet Take 1 tablet (100 mg total) by mouth 2 (two) times daily. 04/11/20   Swayze, Ava, DO  lisinopril (ZESTRIL) 20 MG tablet Take 1 tablet (20 mg total) by mouth daily. 04/12/20   Swayze, Ava, DO  Multiple Vitamins-Minerals (PRESERVISION AREDS PO) Take 1 capsule by mouth daily.     [provider]  Omega-3 Fatty Acids (FISH OIL) 1000 MG CAPS Take 1,000 mg by mouth every other day.    [provider]  pregabalin (LYRICA) 50 MG capsule Take 50 mg by mouth daily.    [provider]  sertraline (ZOLOFT) 25 MG tablet Take 25 mg by mouth daily.    [provider]  XANAX 0.25 MG tablet Take 1 tablet (0.25 mg total) by mouth every 8 (eight) hours as needed for anxiety. 04/17/20   Mariel Aloe, MD  Physical Exam: Vitals:   07/19/21 1545 07/19/21 1600 07/19/21 1615  07/19/21 1630  BP: (!) 151/52 (!) 127/53 126/82 (!) 131/93  Pulse: 67 65 (!) 56 62  Resp: (!) 22 (!) 25 (!) 22 18  Temp:      SpO2: 100% 100% 100% 98%    General exam: AAOx3, elderly,weak appearing. HEENT:Oral mucosa moist, Ear/Nose WNL grossly, dentition normal. Respiratory system: bilaterally wheezing, coughing,no use of accessory muscle Cardiovascular system: S1 & S2 +, No JVD,. Gastrointestinal system: Abdomen soft, NT,ND, BS+ Nervous System:Alert, awake, moving extremities and grossly nonfocal Extremities: No edema, distal peripheral pulses palpable.  Skin: No rashes,no icterus. MSK: Normal muscle bulk,tone, power.  Labs on Admission: I have personally reviewed following labs and imaging studies  CBC: Recent Labs  Lab 07/19/21 1451  WBC 6.6  NEUTROABS 4.2  HGB 15.0  HCT 44.6  MCV 93.7  PLT 417   Basic Metabolic Panel: Recent Labs  Lab 07/19/21 1451  NA 136  K 4.0  CL 96*  CO2 28  GLUCOSE 114*   BUN 25*  CREATININE 1.62*  CALCIUM 10.0   GFR: CrCl cannot be calculated (Unknown ideal weight.). Liver Function Tests: Recent Labs  Lab 07/19/21 1451  AST 41  ALT 15  ALKPHOS 73  BILITOT 0.8  PROT 6.3*  ALBUMIN 3.5   No results for input(s): LIPASE, AMYLASE in the last 168 hours. No results for input(s): AMMONIA in the last 168 hours. Coagulation Profile: No results for input(s): INR, PROTIME in the last 168 hours. Cardiac Enzymes: No results for input(s): CKTOTAL, CKMB, CKMBINDEX, TROPONINI in the last 168 hours. BNP (last 3 results) No results for input(s): PROBNP in the last 8760 hours. HbA1C: No results for input(s): HGBA1C in the last 72 hours. CBG: No results for input(s): GLUCAP in the last 168 hours. Lipid Profile: No results for input(s): CHOL, HDL, LDLCALC, TRIG, CHOLHDL, LDLDIRECT in the last 72 hours. Thyroid Function Tests: No results for input(s): TSH, T4TOTAL, FREET4, T3FREE, THYROIDAB in the last 72 hours. Anemia Panel: No results for input(s): VITAMINB12, FOLATE, FERRITIN, TIBC, IRON, RETICCTPCT in the last 72 hours. Urine analysis:    Component Value Date/Time   COLORURINE YELLOW 04/08/2020 0420   APPEARANCEUR CLEAR 04/08/2020 0420   LABSPEC 1.026 04/08/2020 0420   PHURINE 7.0 04/08/2020 0420   GLUCOSEU NEGATIVE 04/08/2020 0420   HGBUR NEGATIVE 04/08/2020 0420   BILIRUBINUR NEGATIVE 04/08/2020 0420   KETONESUR 5 (A) 04/08/2020 0420   PROTEINUR NEGATIVE 04/08/2020 0420   UROBILINOGEN 0.2 08/02/2013 1342   NITRITE POSITIVE (A) 04/08/2020 0420   LEUKOCYTESUR LARGE (A) 04/08/2020 0420    Radiological Exams on Admission: CT Head Wo Contrast  Result Date: 07/19/2021 CLINICAL DATA:  Mental status change. EXAM: CT HEAD WITHOUT CONTRAST TECHNIQUE: Contiguous axial images were obtained from the base of the skull through the vertex without intravenous contrast. COMPARISON:  April 17, 2020 FINDINGS: Brain: No evidence of acute infarction, hemorrhage,  hydrocephalus, extra-axial collection or mass lesion/mass effect. Chronic left MCA territory infarct again seen. Vascular: No hyperdense vessel or unexpected calcification. Skull: Normal. Negative for fracture or focal lesion. Sinuses/Orbits: No acute finding. Other: None. IMPRESSION: 1. No acute intracranial abnormality. 2. Chronic left MCA territory infarct. Electronically Signed   By: Fidela Salisbury M.D.   On: 07/19/2021 16:35   DG Chest Port 1 View  Result Date: 07/19/2021 CLINICAL DATA:  87 year old female presents for evaluation of shortness of breath and cough. EXAM: PORTABLE CHEST 1 VIEW COMPARISON:  Comparison is made with examination from April 08, 2020. FINDINGS: Cardiomediastinal contours and  hilar structures are stable with mild cardiac enlargement and signs of aortic atherosclerosis. Increased retrocardiac density may represent a combination of airspace disease and known hiatal hernia in the LEFT chest. No additional signs of focal airspace opacity. EKG leads project over the chest. On limited assessment there is no acute skeletal process. IMPRESSION: Increased retrocardiac density may represent a combination of airspace disease and known hiatal hernia in the LEFT chest. Correlate with any signs of pneumonia. Electronically Signed   By: Zetta Bills M.D.   On: 07/19/2021 16:12      Antonieta Pert MD [facility]  If 7PM-7AM, please contact night-coverage [email or web address]  07/19/2021, 5:27 PM

## 2021-07-19 NOTE — ED Notes (Signed)
Medications are not verified at this time  °

## 2021-07-19 NOTE — ED Provider Notes (Signed)
[facility] EMERGENCY DEPARTMENT Provider Note   CSN: [id number] Arrival date & time: 07/19/21  1434     History Chief Complaint  Patient presents with   Altered Mental Status    [name] is a 87 y.o. female.  87 year old female brought in by EMS from [place] facility for altered mental status.  Per EMS, patient is reportedly normally alert and oriented x4 however was altered today.  On arrival in the emergency room, patient is alert to person, place, date and events.  Patient has a history of COPD, is reportedly on constant nebs at her facility.  She has an O2 sat of 85% on room air which improved with 3 L nasal cannula.  Patient has a constant harsh sounding nonproductive cough.      Past Medical History:  Diagnosis Date   Arthritis    CAD (coronary artery disease)    a. Mod prox LAD & first diagonal stenosis by cath 2002 - managed medically since that time with normal LV function.   Cataract    Hyperlipidemia    Hypertension     Patient Active Problem List   Diagnosis Date Noted   Extension of stroke ([place]) 04/16/2020   Dyslipidemia    Stage 3a chronic kidney disease ([place])    Demand ischemia ([place])    Upper back pain 04/08/2020   Thrombocytopenia ([place]) 04/08/2020   Acute CVA (cerebrovascular accident) ([place]) 04/07/2020   TIA (transient ischemic attack) 03/09/2019   Pressure injury of skin 05/21/2018   Incarcerated incisional hernia - reduced 05/19/2018   SBO (small bowel obstruction) ([place]) 05/18/2018   Bilateral primary osteoarthritis of knee 07/22/2017   Hyperlipidemia 05/20/2014   Hypotension 08/02/2013   UTI (lower urinary tract infection) 08/02/2013   Acute renal failure ([place]) 08/02/2013   ARF (acute renal failure) ([place]) 08/02/2013   Calculus of gallbladder with acute cholecystitis, without mention of obstruction 07/10/2013   Acute cholecystitis 06/17/2013   Chest pain 06/17/2013   Pulmonary infiltrate in left lung on chest  x-ray 06/17/2013   Hypertension    CAD (coronary artery disease)     Past Surgical History:  Procedure Laterality Date   ABDOMINAL HYSTERECTOMY     APPENDECTOMY     BACK SURGERY     CARPAL TUNNEL RELEASE     CHOLECYSTECTOMY N/A 06/19/2013   Procedure: LAPAROSCOPIC CHOLECYSTECTOMY;  Surgeon: Harl Bowie, MD;  Location: [place];  Service: General;  Laterality: N/A;   ESOPHAGOGASTRODUODENOSCOPY N/A 04/26/2017   Procedure: ESOPHAGOGASTRODUODENOSCOPY (EGD) W/ DILATION;  Surgeon: Ronnette Juniper, MD;  [facility];  Service: Gastroenterology;  Laterality: N/A;   HERNIA REPAIR     INSERTION OF MESH  05/19/2018   Procedure: INSERTION OF MESH;  Surgeon: Excell Seltzer, MD;  [facility];  Service: General;;   IR GENERIC HISTORICAL  03/18/2016   IR RADIOLOGIST EVAL & MGMT 03/18/2016 Corrie Mckusick, DO [facility] INTERV RAD   KNEE SURGERY     SHOULDER SURGERY     SPLENECTOMY, TOTAL     VENTRAL HERNIA REPAIR N/A 05/19/2018   Procedure: HERNIA REPAIR VENTRAL ADULT;  Surgeon: Excell Seltzer, MD;  [facility];  Service: General;  Laterality: N/A;     OB History   No obstetric history on file.     Family History  Problem Relation Age of Onset   Heart disease Mother        "enlarged valve"   Cancer Mother        Ovarian  Stroke Father    Cancer Sister        Colon    Social History   Tobacco Use   Smoking status: Never   Smokeless tobacco: Never  Vaping Use   Vaping Use: Never used  Substance Use Topics   Alcohol use: No   Drug use: No    Home Medications Prior to Admission medications   Medication Sig Start Date End Date Taking? Authorizing Provider  allopurinol (ZYLOPRIM) 100 MG tablet Take 100 mg by mouth daily.    [provider]  aspirin EC 81 MG tablet Take 1 tablet (81 mg total) by mouth daily. Swallow whole. 05/19/20   Frann Rider, NP  labetalol (NORMODYNE) 100 MG tablet Take 1 tablet (100 mg total) by mouth 2 (two) times daily. 04/11/20    Swayze, Ava, DO  lisinopril (ZESTRIL) 20 MG tablet Take 1 tablet (20 mg total) by mouth daily. 04/12/20   Swayze, Ava, DO  Multiple Vitamins-Minerals (PRESERVISION AREDS PO) Take 1 capsule by mouth daily.     [provider]  Omega-3 Fatty Acids (FISH OIL) 1000 MG CAPS Take 1,000 mg by mouth every other day.    [provider]  pregabalin (LYRICA) 50 MG capsule Take 50 mg by mouth daily.    [provider]  sertraline (ZOLOFT) 25 MG tablet Take 25 mg by mouth daily.    [provider]  XANAX 0.25 MG tablet Take 1 tablet (0.25 mg total) by mouth every 8 (eight) hours as needed for anxiety. 04/17/20   Mariel Aloe, MD    Allergies    Felodipine, Prednisone, Shrimp [shellfish allergy], Codeine, Crestor [rosuvastatin], Feldene [piroxicam], Lescol [fluvastatin], Lipitor [atorvastatin], Nexium [esomeprazole], Pravachol [pravastatin], Prilosec otc [omeprazole magnesium], Statins, Welchol [colesevelam], Zetia [ezetimibe], Zithromax [azithromycin], Zocor [simvastatin], and Omeprazole  Review of Systems   Review of Systems  Constitutional:  Negative for fever.  HENT:  Negative for congestion.   Respiratory:  Positive for cough.   Cardiovascular:  Negative for chest pain.  Gastrointestinal:  Negative for abdominal pain, nausea and vomiting.  Genitourinary:  Negative for dysuria.  Musculoskeletal:  Negative for arthralgias and myalgias.  Skin:  Negative for rash and wound.  Neurological:  Negative for weakness.  Psychiatric/Behavioral:  Positive for confusion.   All other systems reviewed and are negative.  Physical Exam Updated Vital Signs Pulse 64   Temp 97.9 F (36.6 C)   Resp (!) 25   SpO2 90%   Physical Exam Vitals and nursing note reviewed.  Constitutional:      General: She is in acute distress.     Appearance: She is well-developed. She is not diaphoretic.  HENT:     Head: Normocephalic and atraumatic.     Mouth/Throat:     Mouth: Mucous  membranes are moist.  Eyes:     Conjunctiva/sclera: Conjunctivae normal.  Cardiovascular:     Rate and Rhythm: Normal rate and regular rhythm.     Heart sounds: Normal heart sounds.  Pulmonary:     Effort: Tachypnea present.     Breath sounds: Decreased breath sounds and wheezing present.  Abdominal:     Palpations: Abdomen is soft.     Tenderness: There is no abdominal tenderness.  Musculoskeletal:     Cervical back: Neck supple.     Right lower leg: No edema.     Left lower leg: No edema.  Skin:    General: Skin is warm and dry.     Findings: No  erythema or rash.  Neurological:     Mental Status: She is alert and oriented to person, place, and time.  Psychiatric:        Behavior: Behavior normal.    ED Results / Procedures / Treatments   Labs (all labs ordered are listed, but only abnormal results are displayed) Labs Reviewed  RESP PANEL BY RT-PCR (FLU A&B, COVID) ARPGX2  COMPREHENSIVE METABOLIC PANEL  CBC WITH DIFFERENTIAL/PLATELET  URINALYSIS, ROUTINE W REFLEX MICROSCOPIC  BLOOD GAS, VENOUS    EKG EKG Interpretation  Date/Time:  Sunday July 19 2021 14:41:22 EST Ventricular Rate:  69 PR Interval:    QRS Duration: 103 QT Interval:  434 QTC Calculation: 441 R Axis:   139 Text Interpretation: Atrial flutter Low voltage, precordial leads Right ventricular hypertrophy No significant change since last tracing Confirmed by Wandra Arthurs [phone]) on 07/19/2021 3:02:23 PM  Radiology No results found.  Procedures Procedures   Medications Ordered in ED Medications  ipratropium-albuterol (DUONEB) 0.5-2.5 (3) MG/3ML nebulizer solution 3 mL (has no administration in time range)  methylPREDNISolone sodium succinate (SOLU-MEDROL) 125 mg/2 mL injection 125 mg (has no administration in time range)    ED Course  I have reviewed the triage vital signs and the nursing notes.  Pertinent labs & imaging results that were available during my care of the patient were reviewed  by me and considered in my medical decision making (see chart for details).  Clinical Course as of 07/19/21 1507  Sun Jul 19, 2376  3427 87 year old female brought in by EMS for altered mental status, arrives to sat of 85% on room air which improved with 3 L nasal cannula.  Patient states that she is not normally on home oxygen although EMS reports patient is on constant neb treatments at facility at baseline. Patient is a constant harsh nonproductive cough, DuoNeb ordered for her diminished lung sounds with wheezing. CT head ordered due to report of change in mental status although patient seems to provide a fairly good history and is alert to person, place, events although she is hard of hearing. Chest x-ray, EKG, basic labs ordered as well as COVID/flu test for her cough and respiratory distress today. Prednisone was not ordered due to reported allergy. [LM]  Sigurd signed out to oncoming team pending work-up and evaluation after treatment. [LM]    Clinical Course User Index [LM] Roque Lias   MDM Rules/Calculators/A&P                           Final Clinical Impression(s) / ED Diagnoses Final diagnoses:  COPD exacerbation [facility])    Rx / DC Orders ED Discharge Orders     None        Tacy Learn, PA-C 07/19/21 1507    Pattricia Boss, MD 07/21/21 1201

## 2021-07-19 NOTE — ED Notes (Signed)
[name] son [phone] requesting an update

## 2021-07-19 NOTE — ED Triage Notes (Signed)
Pt BIB EMS due to AMS. Pt is usually axox4. Staff from [facility] [employer].

## 2021-07-19 NOTE — ED Notes (Signed)
Patient transported to CT 

## 2021-07-19 NOTE — ED Notes (Signed)
PT to CT.

## 2021-07-20 DIAGNOSIS — H9190 Unspecified hearing loss, unspecified ear: Secondary | ICD-10-CM | POA: Diagnosis present

## 2021-07-20 DIAGNOSIS — Z90710 Acquired absence of both cervix and uterus: Secondary | ICD-10-CM | POA: Diagnosis not present

## 2021-07-20 DIAGNOSIS — I4892 Unspecified atrial flutter: Secondary | ICD-10-CM | POA: Diagnosis present

## 2021-07-20 DIAGNOSIS — N1831 Chronic kidney disease, stage 3a: Secondary | ICD-10-CM | POA: Diagnosis not present

## 2021-07-20 DIAGNOSIS — I129 Hypertensive chronic kidney disease with stage 1 through stage 4 chronic kidney disease, or unspecified chronic kidney disease: Secondary | ICD-10-CM | POA: Diagnosis not present

## 2021-07-20 DIAGNOSIS — I2510 Atherosclerotic heart disease of native coronary artery without angina pectoris: Secondary | ICD-10-CM | POA: Diagnosis present

## 2021-07-20 DIAGNOSIS — Z515 Encounter for palliative care: Secondary | ICD-10-CM | POA: Diagnosis not present

## 2021-07-20 DIAGNOSIS — J9600 Acute respiratory failure, unspecified whether with hypoxia or hypercapnia: Secondary | ICD-10-CM | POA: Diagnosis present

## 2021-07-20 DIAGNOSIS — Z20822 Contact with and (suspected) exposure to covid-19: Secondary | ICD-10-CM | POA: Diagnosis not present

## 2021-07-20 DIAGNOSIS — Z8249 Family history of ischemic heart disease and other diseases of the circulatory system: Secondary | ICD-10-CM | POA: Diagnosis not present

## 2021-07-20 DIAGNOSIS — J1000 Influenza due to other identified influenza virus with unspecified type of pneumonia: Secondary | ICD-10-CM | POA: Diagnosis not present

## 2021-07-20 DIAGNOSIS — J441 Chronic obstructive pulmonary disease with (acute) exacerbation: Secondary | ICD-10-CM | POA: Diagnosis not present

## 2021-07-20 DIAGNOSIS — L89152 Pressure ulcer of sacral region, stage 2: Secondary | ICD-10-CM | POA: Diagnosis present

## 2021-07-20 DIAGNOSIS — Z823 Family history of stroke: Secondary | ICD-10-CM | POA: Diagnosis not present

## 2021-07-20 DIAGNOSIS — L89322 Pressure ulcer of left buttock, stage 2: Secondary | ICD-10-CM | POA: Diagnosis present

## 2021-07-20 DIAGNOSIS — E785 Hyperlipidemia, unspecified: Secondary | ICD-10-CM | POA: Diagnosis present

## 2021-07-20 DIAGNOSIS — N179 Acute kidney failure, unspecified: Secondary | ICD-10-CM | POA: Diagnosis not present

## 2021-07-20 DIAGNOSIS — J9811 Atelectasis: Secondary | ICD-10-CM | POA: Diagnosis not present

## 2021-07-20 DIAGNOSIS — E039 Hypothyroidism, unspecified: Secondary | ICD-10-CM | POA: Diagnosis not present

## 2021-07-20 DIAGNOSIS — G9341 Metabolic encephalopathy: Secondary | ICD-10-CM | POA: Diagnosis not present

## 2021-07-20 DIAGNOSIS — I10 Essential (primary) hypertension: Secondary | ICD-10-CM | POA: Diagnosis not present

## 2021-07-20 DIAGNOSIS — J111 Influenza due to unidentified influenza virus with other respiratory manifestations: Secondary | ICD-10-CM | POA: Diagnosis not present

## 2021-07-20 DIAGNOSIS — Z9081 Acquired absence of spleen: Secondary | ICD-10-CM | POA: Diagnosis not present

## 2021-07-20 DIAGNOSIS — Z9049 Acquired absence of other specified parts of digestive tract: Secondary | ICD-10-CM | POA: Diagnosis not present

## 2021-07-20 DIAGNOSIS — Z8673 Personal history of transient ischemic attack (TIA), and cerebral infarction without residual deficits: Secondary | ICD-10-CM | POA: Diagnosis not present

## 2021-07-20 DIAGNOSIS — Z79899 Other long term (current) drug therapy: Secondary | ICD-10-CM | POA: Diagnosis not present

## 2021-07-20 DIAGNOSIS — Z66 Do not resuscitate: Secondary | ICD-10-CM | POA: Diagnosis not present

## 2021-07-20 DIAGNOSIS — R269 Unspecified abnormalities of gait and mobility: Secondary | ICD-10-CM | POA: Diagnosis present

## 2021-07-20 DIAGNOSIS — J9601 Acute respiratory failure with hypoxia: Secondary | ICD-10-CM | POA: Diagnosis not present

## 2021-07-20 DIAGNOSIS — R0602 Shortness of breath: Secondary | ICD-10-CM | POA: Diagnosis not present

## 2021-07-20 LAB — CBC
HCT: 41.8 % (ref 36.0–46.0)
Hemoglobin: 13.7 g/dL (ref 12.0–15.0)
MCH: 30.5 pg (ref 26.0–34.0)
MCHC: 32.8 g/dL (ref 30.0–36.0)
MCV: 93.1 fL (ref 80.0–100.0)
Platelets: 203 10*3/uL (ref 150–400)
RBC: 4.49 MIL/uL (ref 3.87–5.11)
RDW: 15.9 % — ABNORMAL HIGH (ref 11.5–15.5)
WBC: 6.3 10*3/uL (ref 4.0–10.5)
nRBC: 0 % (ref 0.0–0.2)

## 2021-07-20 LAB — COMPREHENSIVE METABOLIC PANEL
ALT: 17 U/L (ref 0–44)
AST: 43 U/L — ABNORMAL HIGH (ref 15–41)
Albumin: 3 g/dL — ABNORMAL LOW (ref 3.5–5.0)
Alkaline Phosphatase: 63 U/L (ref 38–126)
Anion gap: 10 (ref 5–15)
BUN: 30 mg/dL — ABNORMAL HIGH (ref 8–23)
CO2: 25 mmol/L (ref 22–32)
Calcium: 9.2 mg/dL (ref 8.9–10.3)
Chloride: 96 mmol/L — ABNORMAL LOW (ref 98–111)
Creatinine, Ser: 1.44 mg/dL — ABNORMAL HIGH (ref 0.44–1.00)
GFR, Estimated: 35 mL/min — ABNORMAL LOW (ref >60)
Glucose, Bld: 219 mg/dL — ABNORMAL HIGH (ref 70–99)
Potassium: 3.9 mmol/L (ref 3.5–5.1)
Sodium: 131 mmol/L — ABNORMAL LOW (ref 135–145)
Total Bilirubin: 0.6 mg/dL (ref 0.3–1.2)
Total Protein: 5.8 g/dL — ABNORMAL LOW (ref 6.5–8.1)

## 2021-07-20 LAB — MRSA NEXT GEN BY PCR, NASAL: MRSA by PCR Next Gen: NOT DETECTED

## 2021-07-20 LAB — T4, FREE: Free T4: 1.49 ng/dL — ABNORMAL HIGH (ref 0.61–1.12)

## 2021-07-20 LAB — TSH: TSH: 0.295 u[IU]/mL — ABNORMAL LOW (ref 0.350–4.500)

## 2021-07-20 MED ORDER — GUAIFENESIN 100 MG/5ML PO LIQD
5.0000 mL | ORAL | Status: DC | PRN
  Administered 2021-07-20 – 2021-07-28 (×8): 5 mL via ORAL
  Filled 2021-07-20 (×8): qty 5

## 2021-07-20 MED ORDER — LACTATED RINGERS IV SOLN: INTRAVENOUS | Status: AC

## 2021-07-20 MED ORDER — IPRATROPIUM-ALBUTEROL 0.5-2.5 (3) MG/3ML IN SOLN
3.0000 mL | Freq: Three times a day (TID) | RESPIRATORY_TRACT | Status: DC
  Administered 2021-07-20 – 2021-07-28 (×24): 3 mL via RESPIRATORY_TRACT
  Filled 2021-07-20 (×25): qty 3

## 2021-07-20 NOTE — Progress Notes (Signed)
PROGRESS NOTE    [name]  ZOX:[id number] DOB: [birth year] DOA: 07/19/2021 PCP: Garwin Brothers, MD   Chief Complaint  Patient presents with   Altered Mental Status  Brief Narrative/Hospital Course: [name], 87 y.o. female with PMH of HTN/HLD/history of stroke, CAD, CKD stage II, sent from [facility] facility for evaluation of altered mental status. Per EMS and baseline AAO x4 but was confused today, on arrival for EMS patient was alert to person place date and events, was having constant cough dry hacking and also hypoxic 85 percent on room air.  Apparently she is in constant nebulizer at the facility.   In the ED afebrile, again was hypoxic 85% placed on 3 L nasal cannula with improvement, labs revealed stable CBC but creatinine elevated 1.6, influenza came back positive, chest x-ray no acute finding CT head with old left MCA stroke.  Patient was tachypneic and wheezing placed on nebulizer bronchodilators Tamiflu and admission requested   Subjective: Seen and examined. She appears more alert awake oriented but still complains of cough and wheezing and short of breath Overnight she remains afebrile, on oxygen 2 L vital stable Labs showed improving renal function  Assessment & Plan:  Acute hypoxic respiratory failure Influenza A infection: Reactive airway disease/Chronic Bronchitis-non smoker: No known history of COPD, wheezing hypoxia in the setting of influenza infection, reactive airway disease.  Continue supplemental oxygen, IV Solu-Medrol, bronchodilators tin duoneb and prn albuterol. She had ongoing wheezing hypoxia. Cont Tamiflu. , Chest x-ray with no consolidation.  Acute metabolic encephalopathy:resolved. Appears to be close to baseline since admission CT head with chronic stroke.  Continue delirium precautions and supportive care.    Deconditioning we will obtain PT OT evaluation   Hypertension CAD HLD History of stroke ?? A flutter on EKG: BP is  controlled.  No chest pain.EKG on admission question a flutter, EKG repeated this morning junctional rhythm. Repeat in am. Continue her home labetalol, aspirin   Hypothyroidism-TSH suppressed 0.29, check free T4, continue Synthroid   AKI on CKD WJ:XBJYNWGN creatinine 0.8 04/17/20: Creatinine slowly improving continue to hydrate with IV fluids , monitor bmp in am.   Recent Labs  Lab 07/19/21 1451 07/20/21 0240  BUN 25* 30*  CREATININE 1.62* 1.44*    Ambulatory dysfunction-has knee issues,does not walk  DVT prophylaxis: enoxaparin (LOVENOX) injection 30 mg Start: 07/19/21 2000 Code Status:   Code Status: DNR Family Communication: plan of care discussed with patient's son on phone Status is: admitted as Observation Remains hospitalized for ongoing needs for systemic steroids, bronchodilators, IV fluid hydration due to her AKI with ongoing wheezing cough hypoxia and shortness of breath  Disposition: Currently not medically stable for discharge. Anticipated Disposition: SNF   Objective: Vitals last 24 hrs: Vitals:   07/19/21 1956 07/20/21 0238 07/20/21 0410 07/20/21 0824  BP: (!) 136/100  (!) 142/64   Pulse: 88  (!) 49 66  Resp: 18  16 20   Temp: 98.7 F (37.1 C)  (!) 97.3 F (36.3 C)   TempSrc:   Oral   SpO2: 98% 92% 93% 95%   Weight change:   Intake/Output Summary (Last 24 hours) at 07/20/2021 0930 Last data filed at 07/20/2021 0349 Gross per 24 hour  Intake 1194.54 ml  Output --  Net 1194.54 ml   Net IO Since Admission: 1,194.54 mL [07/20/21 0930]   Physical Examination: General exam: AA and oriented, at baseline, weak,older than stated age. HEENT:Oral mucosa moist, Ear/Nose WNL grossly,dentition normal. Respiratory system: B/l air  entry present with diffuse wheezing BS, no use of accessory muscle, non tender. Cardiovascular system: S1 & S2 +,No JVD. Gastrointestinal system: Abdomen soft, NT,ND, BS+. Nervous System:Alert, awake, moving extremities. Extremities: edema  none, distal peripheral pulses palpable.  Skin: No rashes, no icterus. MSK: Normal muscle bulk, tone, power.  Medications reviewed:  Scheduled Meds:  allopurinol  100 mg Oral Daily   aspirin EC  81 mg Oral Daily   diclofenac Sodium  2 g Topical TID   enoxaparin (LOVENOX) injection  30 mg Subcutaneous Q24H   guaiFENesin  600 mg Oral BID   ipratropium-albuterol  3 mL Nebulization TID   labetalol  50 mg Oral BID   levothyroxine  75 mcg Oral QAC breakfast   methylPREDNISolone (SOLU-MEDROL) injection  120 mg Intravenous Q24H   [START ON 07/21/2021] omega-3 acid ethyl esters  1 g Oral QODAY   oseltamivir  30 mg Oral Daily   pantoprazole  20 mg Oral Daily   pregabalin  50 mg Oral Daily   sertraline  25 mg Oral Daily   Continuous Infusions:  lactated ringers     Diet Order             DIET SOFT Room service appropriate? Yes; Fluid consistency: Thin  Diet effective now                          Weight change:   Wt Readings from Last 3 Encounters:  04/17/20 69.9 kg  04/08/20 64.1 kg  10/24/19 67.9 kg     Consultants:see note  Procedures:see note Antimicrobials: Anti-infectives (From admission, onward)    Start     Dose/Rate Route Frequency Ordered Stop   07/19/21 2200  nitrofurantoin (MACRODANTIN) capsule 100 mg  Status:  Discontinued        100 mg Oral Every 12 hours 07/19/21 1743 07/19/21 1817   07/19/21 1815  oseltamivir (TAMIFLU) capsule 30 mg        30 mg Oral Daily 07/19/21 1811 07/24/21 0959      Culture/Microbiology    Component Value Date/Time   SDES URINE, RANDOM 04/08/2020 1830   SPECREQUEST NONE 04/08/2020 1830   CULT (A) 04/08/2020 1830    <10,000 COLONIES/mL INSIGNIFICANT GROWTH Performed at [facility] [address] [place], [place] [address]    REPTSTATUS 04/10/2020 FINAL 04/08/2020 1830    Other culture-see note  Unresulted Labs (From admission, onward)     Start     Ordered   07/26/21 0500  Creatinine, serum  (enoxaparin  (LOVENOX)    CrCl >/= 30 ml/min)  Weekly,   R     Comments: while on enoxaparin therapy    07/19/21 1727   07/19/21 1451  Urinalysis, Routine w reflex microscopic  Once,   STAT        07/19/21 1450          Data Reviewed: I have personally reviewed following labs and imaging studies CBC: Recent Labs  Lab 07/19/21 1451 07/20/21 0240  WBC 6.6 6.3  NEUTROABS 4.2  --   HGB 15.0 13.7  HCT 44.6 41.8  MCV 93.7 93.1  PLT 224 604   Basic Metabolic Panel: Recent Labs  Lab 07/19/21 1451 07/20/21 0240  NA 136 131*  K 4.0 3.9  CL 96* 96*  CO2 28 25  GLUCOSE 114* 219*  BUN 25* 30*  CREATININE 1.62* 1.44*  CALCIUM 10.0 9.2   GFR: CrCl cannot be calculated (Unknown ideal  weight.). Liver Function Tests: Recent Labs  Lab 07/19/21 1451 07/20/21 0240  AST 41 43*  ALT 15 17  ALKPHOS 73 63  BILITOT 0.8 0.6  PROT 6.3* 5.8*  ALBUMIN 3.5 3.0*   No results for input(s): LIPASE, AMYLASE in the last 168 hours. No results for input(s): AMMONIA in the last 168 hours. Coagulation Profile: No results for input(s): INR, PROTIME in the last 168 hours. Cardiac Enzymes: No results for input(s): CKTOTAL, CKMB, CKMBINDEX, TROPONINI in the last 168 hours. BNP (last 3 results) No results for input(s): PROBNP in the last 8760 hours. HbA1C: No results for input(s): HGBA1C in the last 72 hours. CBG: No results for input(s): GLUCAP in the last 168 hours. Lipid Profile: No results for input(s): CHOL, HDL, LDLCALC, TRIG, CHOLHDL, LDLDIRECT in the last 72 hours. Thyroid Function Tests: Recent Labs    07/20/21 0240  TSH 0.295*  FREET4 1.49*   Anemia Panel: No results for input(s): VITAMINB12, FOLATE, FERRITIN, TIBC, IRON, RETICCTPCT in the last 72 hours. Sepsis Labs: No results for input(s): PROCALCITON, LATICACIDVEN in the last 168 hours.  Recent Results (from the past 240 hour(s))  Resp Panel by RT-PCR (Flu A&B, Covid) Nasopharyngeal Swab     Status: Abnormal   Collection Time:  07/19/21  2:52 PM   Specimen: Nasopharyngeal Swab; Nasopharyngeal(NP) swabs in vial transport medium  Result Value Ref Range Status   SARS Coronavirus 2 by RT PCR NEGATIVE NEGATIVE Final    Comment: (NOTE) SARS-CoV-2 target nucleic acids are NOT DETECTED.  The SARS-CoV-2 RNA is generally detectable in upper respiratory specimens during the acute phase of infection. The lowest concentration of SARS-CoV-2 viral copies this assay can detect is 138 copies/mL. A negative result does not preclude SARS-Cov-2 infection and should not be used as the sole basis for treatment or other patient management decisions. A negative result may occur with  improper specimen collection/handling, submission of specimen other than nasopharyngeal swab, presence of viral mutation(s) within the areas targeted by this assay, and inadequate number of viral copies(<138 copies/mL). A negative result must be combined with clinical observations, patient history, and epidemiological information. The expected result is Negative.  Fact Sheet for Patients:  EntrepreneurPulse.com.au  Fact Sheet for Healthcare Providers:  IncredibleEmployment.be  This test is no t yet approved or cleared by the [place] FDA and  has been authorized for detection and/or diagnosis of SARS-CoV-2 by FDA under an Emergency Use Authorization (EUA). This EUA will remain  in effect (meaning this test can be used) for the duration of the COVID-19 declaration under Section 564(b)(1) of the Act, 21 U.S.C.section 360bbb-3(b)(1), unless the authorization is terminated  or revoked sooner.       Influenza A by PCR POSITIVE (A) NEGATIVE Final   Influenza B by PCR NEGATIVE NEGATIVE Final    Comment: (NOTE) The Xpert Xpress SARS-CoV-2/FLU/RSV plus assay is intended as an aid in the diagnosis of influenza from Nasopharyngeal swab specimens and should not be used as a sole basis for treatment. Nasal washings  and aspirates are unacceptable for Xpert Xpress SARS-CoV-2/FLU/RSV testing.  Fact Sheet for Patients: EntrepreneurPulse.com.au  Fact Sheet for Healthcare Providers: IncredibleEmployment.be  This test is not yet approved or cleared by the [place] FDA and has been authorized for detection and/or diagnosis of SARS-CoV-2 by FDA under an Emergency Use Authorization (EUA). This EUA will remain in effect (meaning this test can be used) for the duration of the COVID-19 declaration under Section 564(b)(1) of the Act, 21 U.S.C.  section 360bbb-3(b)(1), unless the authorization is terminated or revoked.  Performed at [facility], [place] [address], [place], [place] [address]      Radiology Studies: CT Head Wo Contrast  Result Date: 07/19/2021 CLINICAL DATA:  Mental status change. EXAM: CT HEAD WITHOUT CONTRAST TECHNIQUE: Contiguous axial images were obtained from the base of the skull through the vertex without intravenous contrast. COMPARISON:  April 17, 2020 FINDINGS: Brain: No evidence of acute infarction, hemorrhage, hydrocephalus, extra-axial collection or mass lesion/mass effect. Chronic left MCA territory infarct again seen. Vascular: No hyperdense vessel or unexpected calcification. Skull: Normal. Negative for fracture or focal lesion. Sinuses/Orbits: No acute finding. Other: None. IMPRESSION: 1. No acute intracranial abnormality. 2. Chronic left MCA territory infarct. Electronically Signed   By: Fidela Salisbury M.D.   On: 07/19/2021 16:35   DG Chest Port 1 View  Result Date: 07/19/2021 CLINICAL DATA:  87 year old female presents for evaluation of shortness of breath and cough. EXAM: PORTABLE CHEST 1 VIEW COMPARISON:  Comparison is made with examination from April 08, 2020. FINDINGS: Cardiomediastinal contours and hilar structures are stable with mild cardiac enlargement and signs of aortic atherosclerosis. Increased retrocardiac density  may represent a combination of airspace disease and known hiatal hernia in the LEFT chest. No additional signs of focal airspace opacity. EKG leads project over the chest. On limited assessment there is no acute skeletal process. IMPRESSION: Increased retrocardiac density may represent a combination of airspace disease and known hiatal hernia in the LEFT chest. Correlate with any signs of pneumonia. Electronically Signed   By: Zetta Bills M.D.   On: 07/19/2021 16:12     LOS: 0 days   Antonieta Pert, MD [facility]  07/20/2021, 9:30 AM

## 2021-07-20 NOTE — Plan of Care (Signed)
  Problem: Education: Goal: Knowledge of General Education information will improve Description: Including pain rating scale, medication(s)/side effects and non-pharmacologic comfort measures Outcome: Progressing   Problem: Health Behavior/Discharge Planning: Goal: Ability to manage health-related needs will improve Outcome: Progressing   Problem: Clinical Measurements: Goal: Ability to maintain clinical measurements within normal limits will improve Outcome: Progressing Goal: Will remain free from infection Outcome: Progressing Goal: Diagnostic test results will improve Outcome: Progressing Goal: Cardiovascular complication will be avoided Outcome: Progressing   Problem: Nutrition: Goal: Adequate nutrition will be maintained Outcome: Progressing   Problem: Coping: Goal: Level of anxiety will decrease Outcome: Progressing   Problem: Elimination: Goal: Will not experience complications related to bowel motility Outcome: Progressing Goal: Will not experience complications related to urinary retention Outcome: Progressing   Problem: Pain Managment: Goal: General experience of comfort will improve Outcome: Progressing   Problem: Safety: Goal: Ability to remain free from injury will improve Outcome: Progressing   Problem: Skin Integrity: Goal: Risk for impaired skin integrity will decrease Outcome: Progressing   Problem: Clinical Measurements: Goal: Respiratory complications will improve Outcome: Not Progressing   Problem: Activity: Goal: Risk for activity intolerance will decrease Outcome: Not Progressing

## 2021-07-20 NOTE — Plan of Care (Signed)

## 2021-07-21 DIAGNOSIS — J441 Chronic obstructive pulmonary disease with (acute) exacerbation: Secondary | ICD-10-CM | POA: Diagnosis not present

## 2021-07-21 LAB — BASIC METABOLIC PANEL
Anion gap: 10 (ref 5–15)
BUN: 40 mg/dL — ABNORMAL HIGH (ref 8–23)
CO2: 25 mmol/L (ref 22–32)
Calcium: 9.6 mg/dL (ref 8.9–10.3)
Chloride: 99 mmol/L (ref 98–111)
Creatinine, Ser: 1.02 mg/dL — ABNORMAL HIGH (ref 0.44–1.00)
GFR, Estimated: 52 mL/min — ABNORMAL LOW (ref >60)
Glucose, Bld: 134 mg/dL — ABNORMAL HIGH (ref 70–99)
Potassium: 3.9 mmol/L (ref 3.5–5.1)
Sodium: 134 mmol/L — ABNORMAL LOW (ref 135–145)

## 2021-07-21 MED ORDER — LEVOTHYROXINE SODIUM 50 MCG PO TABS
50.0000 ug | ORAL_TABLET | Freq: Every day | ORAL | Status: DC
  Administered 2021-07-22 – 2021-07-28 (×6): 50 ug via ORAL
  Filled 2021-07-21 (×7): qty 1

## 2021-07-21 MED ORDER — HYDRALAZINE HCL 20 MG/ML IJ SOLN
INTRAMUSCULAR | Status: AC
  Filled 2021-07-21: qty 1

## 2021-07-21 MED ORDER — HYDRALAZINE HCL 25 MG PO TABS
25.0000 mg | ORAL_TABLET | Freq: Three times a day (TID) | ORAL | Status: DC
Start: 2021-07-21 — End: 2021-07-22
  Administered 2021-07-21 – 2021-07-22 (×4): 25 mg via ORAL
  Filled 2021-07-21 (×4): qty 1

## 2021-07-21 MED ORDER — HYDRALAZINE HCL 20 MG/ML IJ SOLN
10.0000 mg | Freq: Four times a day (QID) | INTRAMUSCULAR | Status: DC | PRN
  Administered 2021-07-21 – 2021-07-22 (×2): 10 mg via INTRAVENOUS
  Filled 2021-07-21: qty 1

## 2021-07-21 MED ORDER — HYDRALAZINE HCL 20 MG/ML IJ SOLN
10.0000 mg | Freq: Once | INTRAMUSCULAR | Status: AC
Start: 2021-07-21 — End: 2021-07-21
  Administered 2021-07-21: 10 mg via INTRAVENOUS

## 2021-07-21 NOTE — Evaluation (Signed)
Physical Therapy Evaluation Patient Details Name: [name] MRN: [id number] DOB: 11/08/1928 Today's Date: 07/21/2021  History of Present Illness  Pt is a 87 y.o. female admitted from [facility] SNF 12/4 with AMS, influenza+. PMH:  HTN, HLD, h/o stroke, CAD, CKD stage II, OA, hearing loss   Clinical Impression  PT eval complete. PTA pt resided at [facility] She required hoyer lift for transfers OOB, and was dependent with ADLs except able to self feed after set up. Spoke with pt's son, [name], via phone to confirm prior status. On eval, pt required total assist rolling R/L. Pain reported BLE (primarily knees) with any mobility/ROM attempts. Skilled PT intervention not indicated due to total assist status PTA. PT signing off.       Recommendations for follow up therapy are one component of a multi-disciplinary discharge planning process, led by the attending physician.  Recommendations may be updated based on patient status, additional functional criteria and insurance authorization.  Follow Up Recommendations Long-term institutional care without follow-up therapy    Assistance Recommended at Discharge Frequent or constant Supervision/Assistance  Functional Status Assessment Patient has not had a recent decline in their functional status  Equipment Recommendations  None recommended by PT    Recommendations for Other Services       Precautions / Restrictions Precautions Precautions: Fall;Other (comment) Precaution Comments: bradycardia      Mobility  Bed Mobility Overal bed mobility: Needs Assistance Bed Mobility: Rolling Rolling: Total assist              Transfers                   General transfer comment: hoyer lift transfers at baseline    Ambulation/Gait               General Gait Details: nonambulatory at baseline  Stairs            Wheelchair Mobility    Modified Rankin (Stroke Patients Only)       Balance                                              Pertinent Vitals/Pain Pain Assessment: Faces Faces Pain Scale: Hurts even more Pain Location: BLE (primarily knees) with mobility/ROM Pain Descriptors / Indicators: Grimacing;Guarding Pain Intervention(s): Limited activity within patient's tolerance;Monitored during session    Home Living Family/patient expects to be discharged to:: Skilled nursing facility                   Additional Comments: from [facility] SN[facility]. Spoke with pt's son, [name], via phone to confirm prior mobility status.    Prior Function Prior Level of Function : Needs assist       Physical Assist : Mobility (physical);ADLs (physical) Mobility (physical): Bed mobility;Transfers ADLs (physical): Grooming;Bathing;Dressing;Toileting Mobility Comments: hoyer lift transfers, primarily in bed or lift chair ADLs Comments: Pt able to feed herself.     Hand Dominance   Dominant Hand: Right    Extremity/Trunk Assessment   Upper Extremity Assessment Upper Extremity Assessment: Generalized weakness    Lower Extremity Assessment Lower Extremity Assessment: Generalized weakness;RLE deficits/detail;LLE deficits/detail RLE Deficits / Details: absent cartilage bilat knees, very painful with any LE mobility, limited flexion at hips/knees LLE Deficits / Details: absent cartilage bilat knees, very painful with any LE mobility, limited flexion at hips/knees  Communication   Communication: Expressive difficulties;HOH  Cognition Arousal/Alertness: Awake/alert Behavior During Therapy: WFL for tasks assessed/performed Overall Cognitive Status: Difficult to assess                                 General Comments: Appropriate. Able to provide accurate history (as confirmed by speaking to pt's son).        General Comments General comments (skin integrity, edema, etc.): Sleeping on arrival with HR 48. Increased into 60s by end of  session. SpO2 95% on 2L.    Exercises     Assessment/Plan    PT Assessment Patient does not need any further PT services  PT Problem List         PT Treatment Interventions      PT Goals (Current goals can be found in the Care Plan section)  Acute Rehab PT Goals Patient Stated Goal: not stated PT Goal Formulation: All assessment and education complete, DC therapy    Frequency     Barriers to discharge        Co-evaluation               AM-PAC PT "6 Clicks" Mobility  Outcome Measure Help needed turning from your back to your side while in a flat bed without using bedrails?: Total Help needed moving from lying on your back to sitting on the side of a flat bed without using bedrails?: Total Help needed moving to and from a bed to a chair (including a wheelchair)?: Total Help needed standing up from a chair using your arms (e.g., wheelchair or bedside chair)?: Total Help needed to walk in hospital room?: Total Help needed climbing 3-5 steps with a railing? : Total 6 Click Score: 6    End of Session Equipment Utilized During Treatment: Oxygen Activity Tolerance: Patient limited by pain;Patient limited by fatigue Patient left: in bed;with call bell/phone within reach;with bed alarm set Nurse Communication: Mobility status;Need for lift equipment PT Visit Diagnosis: Muscle weakness (generalized) (M62.81)    Time: 1100-1114 PT Time Calculation (min) (ACUTE ONLY): 14 min   Charges:   PT Evaluation $PT Eval Moderate Complexity: 1 Mod          Lorrin Goodell, PT  Office # [phone] Pager [phone]   Lorriane Shire 07/21/2021, 12:48 PM

## 2021-07-21 NOTE — Plan of Care (Signed)

## 2021-07-21 NOTE — Progress Notes (Signed)
PROGRESS NOTE    HAYDIN DUNN  WER:[id number] DOB: 05/05/1929 DOA: 07/19/2021 PCP: Garwin Brothers, MD   Chief Complaint  Patient presents with   Altered Mental Status  Brief Narrative/Hospital Course: [name], 87 y.o. female with PMH of HTN/HLD/history of stroke, CAD, CKD stage II, sent from [place] facility for evaluation of altered mental status. Per EMS and baseline AAO x4 but was confused today, on arrival for EMS patient was alert to person place date and events, was having constant cough dry hacking and also hypoxic 85 percent on room air.  Apparently she is in constant nebulizer at the facility.   In the ED afebrile, again was hypoxic 85% placed on 3 L nasal cannula with improvement, labs revealed stable CBC but creatinine elevated 1.6, influenza came back positive, chest x-ray no acute finding CT head with old left MCA stroke.  Patient was tachypneic and wheezing placed on nebulizer bronchodilators Tamiflu and admission requested   Subjective: Seen examined this morning.  Patient is alert awake cooperative.  Still complains of coughing wheezing and chest pain. Overnight still endorses nasal cannula, blood pressure up to 190s needing IV hydralazine.    Assessment & Plan:  Acute hypoxic respiratory failure Influenza A infection: Reactive airway disease/Chronic Bronchitis-non smoker: No known history of COPD,  but wheezing extensively and also needing supplemental oxygen suspect reactive airway disease in the setting of influenza infection.continue with current supplemental oxygen and wean as tolerated, continue IV Solu-Medrol, bronchodilators -scheduled duoneb and prn albuterol.Cont Tamiflu. , Chest x-ray with no consolidation.  Acute metabolic encephalopathy:resolved.CT head with chronic stroke.  Continue delirium precautions and supportive care.    Deconditioning:Obtain PT OT evaluation   Hypertension CAD HLD History of stroke ?? A flutter on EKG: BP poorly  controlled/accelerated, continue labetalol, aspirin add hydralazine 25mg  tisHas had no chest pain.EKG on admission question a flutter, EKG repeat- junctional rhythm.  Repeat EKG again monitoring telemetry. On ASA  Hypothyroidism-TSH suppressed 0.29, free T4 is high at 1.4, she is on Synthroid 75 MCG we will cut down the dose to 50 mcg repeat TSH in 3 weeks   AKI on CKD PP:JKDTOIZT creatinine 0.8 04/17/20: Creatinine nicely improved, will wean down IV fluids encourage oral intake.   Recent Labs  Lab 07/19/21 1451 07/20/21 0240 07/21/21 0658  BUN 25* 30* 40*  CREATININE 1.62* 1.44* 1.02*    Ambulatory dysfunction-has knee issues,does not walk  DVT prophylaxis: enoxaparin (LOVENOX) injection 30 mg Start: 07/19/21 2000 Code Status:   Code Status: DNR Family Communication: plan of care discussed with patient's son on phone Status is: Inpatient Remains hospitalized for ongoing needs for reactive airway disease with influenza A positive wheezing hypoxic   Disposition: Currently not medically stable for discharge. Anticipated Disposition: SNF   Objective: Vitals last 24 hrs: Vitals:   07/21/21 0510 07/21/21 0545 07/21/21 0831 07/21/21 0845  BP: (S) (!) 194/74 (!) 166/65 (!) 174/86   Pulse: (!) 52 83 (!) 58   Resp:   18   Temp:   97.9 F (36.6 C)   TempSrc:   Oral   SpO2:   94% 94%   Weight change:   Intake/Output Summary (Last 24 hours) at 07/21/2021 1106 Last data filed at 07/21/2021 0527 Gross per 24 hour  Intake 1073 ml  Output 1450 ml  Net -377 ml   Net IO Since Admission: 1,057.54 mL [07/21/21 1106]   Physical Examination: General exam: AAOx baseline, elderly,weak appearing. HEENT:Oral mucosa moist, Ear/Nose WNL grossly, dentition  normal. Respiratory system: bilaterally wheezing present diffusely, no use of accessory muscle Cardiovascular system: S1 & S2 +, No JVD,. Gastrointestinal system: Abdomen soft,NT,ND, BS+ Nervous System:Alert, awake, moving extremities and  grossly nonfocal Extremities: No edema, distal peripheral pulses palpable.  Skin: No rashes,no icterus. MSK: Normal muscle bulk,tone, power .  Medications reviewed:  Scheduled Meds:  allopurinol  100 mg Oral Daily   aspirin EC  81 mg Oral Daily   diclofenac Sodium  2 g Topical TID   enoxaparin (LOVENOX) injection  30 mg Subcutaneous Q24H   guaiFENesin  600 mg Oral BID   hydrALAZINE       hydrALAZINE  25 mg Oral Q8H   ipratropium-albuterol  3 mL Nebulization TID   labetalol  50 mg Oral BID   levothyroxine  75 mcg Oral QAC breakfast   methylPREDNISolone (SOLU-MEDROL) injection  120 mg Intravenous Q24H   omega-3 acid ethyl esters  1 g Oral QODAY   oseltamivir  30 mg Oral Daily   pantoprazole  20 mg Oral Daily   pregabalin  50 mg Oral Daily   sertraline  25 mg Oral Daily   Continuous Infusions:   Diet Order             DIET SOFT Room service appropriate? Yes; Fluid consistency: Thin  Diet effective now                          Weight change:   Wt Readings from Last 3 Encounters:  04/17/20 69.9 kg  04/08/20 64.1 kg  10/24/19 67.9 kg     Consultants:see note  Procedures:see note Antimicrobials: Anti-infectives (From admission, onward)    Start     Dose/Rate Route Frequency Ordered Stop   07/19/21 2200  nitrofurantoin (MACRODANTIN) capsule 100 mg  Status:  Discontinued        100 mg Oral Every 12 hours 07/19/21 1743 07/19/21 1817   07/19/21 1815  oseltamivir (TAMIFLU) capsule 30 mg        30 mg Oral Daily 07/19/21 1811 07/24/21 0959      Culture/Microbiology    Component Value Date/Time   SDES URINE, RANDOM 04/08/2020 1830   SPECREQUEST NONE 04/08/2020 1830   CULT (A) 04/08/2020 1830    <10,000 COLONIES/mL INSIGNIFICANT GROWTH Performed at [facility] [address] [place], [place] [address]    REPTSTATUS 04/10/2020 FINAL 04/08/2020 1830    Other culture-see note  Unresulted Labs (From admission, onward)     Start     Ordered   07/26/21  0500  Creatinine, serum  (enoxaparin (LOVENOX)    CrCl >/= 30 ml/min)  Weekly,   R     Comments: while on enoxaparin therapy    07/19/21 1727   07/22/21 9147  Basic metabolic panel  Daily,   R     Question:  Specimen collection method  Answer:  Lab=Lab collect   07/21/21 0734   07/22/21 0500  CBC  Daily,   R     Question:  Specimen collection method  Answer:  Lab=Lab collect   07/21/21 0734   07/19/21 1451  Urinalysis, Routine w reflex microscopic  Once,   STAT        07/19/21 1450          Data Reviewed: I have personally reviewed following labs and imaging studies CBC: Recent Labs  Lab 07/19/21 1451 07/20/21 0240  WBC 6.6 6.3  NEUTROABS 4.2  --   HGB  15.0 13.7  HCT 44.6 41.8  MCV 93.7 93.1  PLT 224 660   Basic Metabolic Panel: Recent Labs  Lab 07/19/21 1451 07/20/21 0240 07/21/21 0658  NA 136 131* 134*  K 4.0 3.9 3.9  CL 96* 96* 99  CO2 28 25 25   GLUCOSE 114* 219* 134*  BUN 25* 30* 40*  CREATININE 1.62* 1.44* 1.02*  CALCIUM 10.0 9.2 9.6   GFR: CrCl cannot be calculated (Unknown ideal weight.). Liver Function Tests: Recent Labs  Lab 07/19/21 1451 07/20/21 0240  AST 41 43*  ALT 15 17  ALKPHOS 73 63  BILITOT 0.8 0.6  PROT 6.3* 5.8*  ALBUMIN 3.5 3.0*   No results for input(s): LIPASE, AMYLASE in the last 168 hours. No results for input(s): AMMONIA in the last 168 hours. Coagulation Profile: No results for input(s): INR, PROTIME in the last 168 hours. Cardiac Enzymes: No results for input(s): CKTOTAL, CKMB, CKMBINDEX, TROPONINI in the last 168 hours. BNP (last 3 results) No results for input(s): PROBNP in the last 8760 hours. HbA1C: No results for input(s): HGBA1C in the last 72 hours. CBG: No results for input(s): GLUCAP in the last 168 hours. Lipid Profile: No results for input(s): CHOL, HDL, LDLCALC, TRIG, CHOLHDL, LDLDIRECT in the last 72 hours. Thyroid Function Tests: Recent Labs    07/20/21 0240  TSH 0.295*  FREET4 1.49*   Anemia  Panel: No results for input(s): VITAMINB12, FOLATE, FERRITIN, TIBC, IRON, RETICCTPCT in the last 72 hours. Sepsis Labs: No results for input(s): PROCALCITON, LATICACIDVEN in the last 168 hours.  Recent Results (from the past 240 hour(s))  Resp Panel by RT-PCR (Flu A&B, Covid) Nasopharyngeal Swab     Status: Abnormal   Collection Time: 07/19/21  2:52 PM   Specimen: Nasopharyngeal Swab; Nasopharyngeal(NP) swabs in vial transport medium  Result Value Ref Range Status   SARS Coronavirus 2 by RT PCR NEGATIVE NEGATIVE Final    Comment: (NOTE) SARS-CoV-2 target nucleic acids are NOT DETECTED.  The SARS-CoV-2 RNA is generally detectable in upper respiratory specimens during the acute phase of infection. The lowest concentration of SARS-CoV-2 viral copies this assay can detect is 138 copies/mL. A negative result does not preclude SARS-Cov-2 infection and should not be used as the sole basis for treatment or other patient management decisions. A negative result may occur with  improper specimen collection/handling, submission of specimen other than nasopharyngeal swab, presence of viral mutation(s) within the areas targeted by this assay, and inadequate number of viral copies(<138 copies/mL). A negative result must be combined with clinical observations, patient history, and epidemiological information. The expected result is Negative.  Fact Sheet for Patients:  EntrepreneurPulse.com.au  Fact Sheet for Healthcare Providers:  IncredibleEmployment.be  This test is no t yet approved or cleared by the [place] FDA and  has been authorized for detection and/or diagnosis of SARS-CoV-2 by FDA under an Emergency Use Authorization (EUA). This EUA will remain  in effect (meaning this test can be used) for the duration of the COVID-19 declaration under Section 564(b)(1) of the Act, 21 U.S.C.section 360bbb-3(b)(1), unless the authorization is terminated  or  revoked sooner.       Influenza A by PCR POSITIVE (A) NEGATIVE Final   Influenza B by PCR NEGATIVE NEGATIVE Final    Comment: (NOTE) The Xpert Xpress SARS-CoV-2/FLU/RSV plus assay is intended as an aid in the diagnosis of influenza from Nasopharyngeal swab specimens and should not be used as a sole basis for treatment. Nasal washings and aspirates are  unacceptable for Xpert Xpress SARS-CoV-2/FLU/RSV testing.  Fact Sheet for Patients: EntrepreneurPulse.com.au  Fact Sheet for Healthcare Providers: IncredibleEmployment.be  This test is not yet approved or cleared by the [place] FDA and has been authorized for detection and/or diagnosis of SARS-CoV-2 by FDA under an Emergency Use Authorization (EUA). This EUA will remain in effect (meaning this test can be used) for the duration of the COVID-19 declaration under Section 564(b)(1) of the Act, 21 U.S.C. section 360bbb-3(b)(1), unless the authorization is terminated or revoked.  Performed at [facility], [place] [address] [place], [place] [address]   MRSA Next Gen by PCR, Nasal     Status: None   Collection Time: 07/20/21  8:46 PM   Specimen: Nasal Mucosa; Nasal Swab  Result Value Ref Range Status   MRSA by PCR Next Gen NOT DETECTED NOT DETECTED Final    Comment: (NOTE) The GeneXpert MRSA Assay (FDA approved for NASAL specimens only), is one component of a comprehensive MRSA colonization surveillance program. It is not intended to diagnose MRSA infection nor to guide or monitor treatment for MRSA infections. Test performance is not FDA approved in patients less than 69 years old. Performed at [facility], [place] [address] [place], [place] [address]      Radiology Studies: CT Head Wo Contrast  Result Date: 07/19/2021 CLINICAL DATA:  Mental status change. EXAM: CT HEAD WITHOUT CONTRAST TECHNIQUE: Contiguous axial images were obtained from the base of the skull through the  vertex without intravenous contrast. COMPARISON:  April 17, 2020 FINDINGS: Brain: No evidence of acute infarction, hemorrhage, hydrocephalus, extra-axial collection or mass lesion/mass effect. Chronic left MCA territory infarct again seen. Vascular: No hyperdense vessel or unexpected calcification. Skull: Normal. Negative for fracture or focal lesion. Sinuses/Orbits: No acute finding. Other: None. IMPRESSION: 1. No acute intracranial abnormality. 2. Chronic left MCA territory infarct. Electronically Signed   By: Fidela Salisbury M.D.   On: 07/19/2021 16:35   DG Chest Port 1 View  Result Date: 07/19/2021 CLINICAL DATA:  87 year old female presents for evaluation of shortness of breath and cough. EXAM: PORTABLE CHEST 1 VIEW COMPARISON:  Comparison is made with examination from April 08, 2020. FINDINGS: Cardiomediastinal contours and hilar structures are stable with mild cardiac enlargement and signs of aortic atherosclerosis. Increased retrocardiac density may represent a combination of airspace disease and known hiatal hernia in the LEFT chest. No additional signs of focal airspace opacity. EKG leads project over the chest. On limited assessment there is no acute skeletal process. IMPRESSION: Increased retrocardiac density may represent a combination of airspace disease and known hiatal hernia in the LEFT chest. Correlate with any signs of pneumonia. Electronically Signed   By: Zetta Bills M.D.   On: 07/19/2021 16:12     LOS: 1 day   Antonieta Pert, MD [facility]  07/21/2021, 11:06 AM

## 2021-07-21 NOTE — Progress Notes (Signed)
Patient's BP elevated this shift, patient asymptomatic. Clarene Essex, NP notified. PRN tylenol given for headache and HS dose of PRN xanax given as well with no effect on BP though patient stated anxiety and headache improved - see MAR. Clarene Essex notified of continued elevated BP and x1 dose 10mg  IV hydralazine ordered and given with good effect -see flowsheets for vitals signs.

## 2021-07-22 DIAGNOSIS — J111 Influenza due to unidentified influenza virus with other respiratory manifestations: Secondary | ICD-10-CM | POA: Diagnosis not present

## 2021-07-22 LAB — BASIC METABOLIC PANEL
Anion gap: 11 (ref 5–15)
BUN: 38 mg/dL — ABNORMAL HIGH (ref 8–23)
CO2: 26 mmol/L (ref 22–32)
Calcium: 9.5 mg/dL (ref 8.9–10.3)
Chloride: 99 mmol/L (ref 98–111)
Creatinine, Ser: 0.96 mg/dL (ref 0.44–1.00)
GFR, Estimated: 56 mL/min — ABNORMAL LOW (ref >60)
Glucose, Bld: 143 mg/dL — ABNORMAL HIGH (ref 70–99)
Potassium: 4.1 mmol/L (ref 3.5–5.1)
Sodium: 136 mmol/L (ref 135–145)

## 2021-07-22 LAB — CBC
HCT: 41.8 % (ref 36.0–46.0)
Hemoglobin: 13.7 g/dL (ref 12.0–15.0)
MCH: 30.5 pg (ref 26.0–34.0)
MCHC: 32.8 g/dL (ref 30.0–36.0)
MCV: 93.1 fL (ref 80.0–100.0)
Platelets: 218 10*3/uL (ref 150–400)
RBC: 4.49 MIL/uL (ref 3.87–5.11)
RDW: 16.1 % — ABNORMAL HIGH (ref 11.5–15.5)
WBC: 9.6 10*3/uL (ref 4.0–10.5)
nRBC: 0.2 % (ref 0.0–0.2)

## 2021-07-22 LAB — GLUCOSE, CAPILLARY: Glucose-Capillary: 115 mg/dL — ABNORMAL HIGH (ref 70–99)

## 2021-07-22 MED ORDER — BUDESONIDE 0.5 MG/2ML IN SUSP
0.2500 mg | Freq: Two times a day (BID) | RESPIRATORY_TRACT | Status: DC
  Administered 2021-07-22 – 2021-07-28 (×11): 0.25 mg via RESPIRATORY_TRACT
  Filled 2021-07-22 (×12): qty 2

## 2021-07-22 MED ORDER — HYDRALAZINE HCL 50 MG PO TABS
50.0000 mg | ORAL_TABLET | ORAL | Status: AC
  Administered 2021-07-22: 50 mg via ORAL
  Filled 2021-07-22: qty 1

## 2021-07-22 MED ORDER — HYDRALAZINE HCL 50 MG PO TABS
50.0000 mg | ORAL_TABLET | Freq: Three times a day (TID) | ORAL | Status: DC
  Administered 2021-07-22 – 2021-07-23 (×3): 50 mg via ORAL
  Filled 2021-07-22 (×3): qty 1

## 2021-07-22 MED ORDER — PHENOL 1.4 % MT LIQD
1.0000 | OROMUCOSAL | Status: DC | PRN
  Administered 2021-07-23 – 2021-07-24 (×3): 1 via OROMUCOSAL
  Filled 2021-07-22: qty 177

## 2021-07-22 MED ORDER — METHYLPREDNISOLONE SODIUM SUCC 40 MG IJ SOLR
40.0000 mg | Freq: Two times a day (BID) | INTRAMUSCULAR | Status: DC
Start: 2021-07-23 — End: 2021-07-25
  Administered 2021-07-23 – 2021-07-25 (×5): 40 mg via INTRAVENOUS
  Filled 2021-07-22 (×5): qty 1

## 2021-07-22 NOTE — Evaluation (Signed)
Occupational Therapy Evaluation Patient Details Name: [name] MRN: [id number] DOB: February 06, 1929 Today's Date: 07/22/2021   History of Present Illness Pt is a 87 y.o. female admitted from [facility] 12/4 with AMS, influenza+. PMH:  HTN, HLD, h/o stroke, CAD, CKD stage II, OA, hearing loss   Clinical Impression   Pt dependent at baseline with most ADLs and functional mobility, per chart review pt able to feed self. Pt lives in SNF and uses hoyer lift at baseline for transfers. Pt max A for ADLs during session, UB shoulder ROM ~60 degrees, grip strength 3/5 in BUE. Pt limited during session by decreased activity tolerance, pain, and strength/ROM however is close to baseline function. Will  continue to follow to maximize safety and independence with UB ADLs. Recommend SNF at d/c.     Recommendations for follow up therapy are one component of a multi-disciplinary discharge planning process, led by the attending physician.  Recommendations may be updated based on patient status, additional functional criteria and insurance authorization.   Follow Up Recommendations  Skilled nursing-short term rehab (<3 hours/day)    Assistance Recommended at Discharge Intermittent Supervision/Assistance  Functional Status Assessment     Equipment Recommendations  Other (comment) (TBD at next venue of care)    Recommendations for Other Services       Precautions / Restrictions Precautions Precautions: Fall;Other (comment) Precaution Comments: bradycardia Restrictions Weight Bearing Restrictions: No      Mobility Bed Mobility Overal bed mobility: Needs Assistance Bed Mobility: Rolling Rolling: Total assist              Transfers                   General transfer comment: transfers via hoyer lift at baseline      Balance                                           ADL either performed or assessed with clinical judgement   ADL Overall ADL's : Needs  assistance/impaired Eating/Feeding: Set up;Bed level   Grooming: Minimal assistance;Moderate assistance;Set up;Bed level   Upper Body Bathing: Maximal assistance;Bed level   Lower Body Bathing: Maximal assistance;Bed level   Upper Body Dressing : Maximal assistance;Bed level   Lower Body Dressing: Maximal assistance;Bed level   Toilet Transfer: Maximal assistance;+2 for physical assistance Toilet Transfer Details (indicate cue type and reason): uses hoyer lift at baseline Toileting- Clothing Manipulation and Hygiene: Maximal assistance;Bed level       Functional mobility during ADLs: Maximal assistance;+2 for physical assistance General ADL Comments: supervision for bed level ADLs, eating and seated grooming task.     Vision   Vision Assessment?: No apparent visual deficits     Perception Perception Perception: Not tested   Praxis Praxis Praxis: Not tested    Pertinent Vitals/Pain Pain Assessment: Faces Faces Pain Scale: Hurts little more Pain Location: abdomen with coughing Pain Descriptors / Indicators: Constant;Discomfort;Grimacing Pain Intervention(s): Limited activity within patient's tolerance;Monitored during session     Hand Dominance     Extremity/Trunk Assessment Upper Extremity Assessment Upper Extremity Assessment: Generalized weakness   Lower Extremity Assessment Lower Extremity Assessment: Defer to PT evaluation       Communication Communication Communication: Expressive difficulties;HOH   Cognition Arousal/Alertness: Awake/alert Behavior During Therapy: WFL for tasks assessed/performed Overall Cognitive Status: Difficult to assess  General Comments: confirms PLOF and history as per chart     General Comments  SpO2 in the 90's on O2 during session, pt coughing heavily throughout session    Exercises     Shoulder Instructions      Home Living Family/patient expects to be discharged to::  Skilled nursing facility                                 Additional Comments: from [facility]. Per PT chart review who spoke with family via phone to confirm prior mobility/ADL status.      Prior Functioning/Environment Prior Level of Function : Needs assist       Physical Assist : Mobility (physical);ADLs (physical) Mobility (physical): Bed mobility;Transfers ADLs (physical): Grooming;Bathing;Dressing;Toileting Mobility Comments: hoyer lift transfers, primarily in bed or lift chair ADLs Comments: Pt able to feed herself.        OT Problem List:        OT Treatment/Interventions:      OT Goals(Current goals can be found in the care plan section) ADL Goals Pt Will Perform Eating: with set-up;bed level Pt Will Perform Grooming: with set-up;bed level Pt Will Perform Upper Body Dressing: with min assist;with mod assist;bed level  OT Frequency: Min 2X/week   Barriers to D/C:            Co-evaluation              AM-PAC OT "6 Clicks" Daily Activity     Outcome Measure Help from another person eating meals?: A Little Help from another person taking care of personal grooming?: A Little Help from another person toileting, which includes using toliet, bedpan, or urinal?: Total Help from another person bathing (including washing, rinsing, drying)?: Total Help from another person to put on and taking off regular upper body clothing?: A Lot Help from another person to put on and taking off regular lower body clothing?: Total 6 Click Score: 11   End of Session Nurse Communication: Mobility status  Activity Tolerance: Patient limited by fatigue Patient left: in bed;with call bell/phone within reach;with bed alarm set  OT Visit Diagnosis: Pain;Muscle weakness (generalized) (M62.81)                Time: 1037-1050 OT Time Calculation (min): 13 min Charges:  OT General Charges $OT Visit: 1 Visit OT Evaluation $OT Eval Low Complexity: 1 Low OT  Treatments $Self Care/Home Management : 8-22 mins  Lynnda Child, OTD, OTR/L Acute Rehab [phone] - [place] 07/22/2021, 11:02 AM

## 2021-07-22 NOTE — Plan of Care (Signed)

## 2021-07-22 NOTE — NC FL2 (Signed)
MEDICAID FL2 LEVEL OF CARE SCREENING TOOL     IDENTIFICATION  Patient Name: [name] Birthdate: 1929/06/18 Sex: female Admission Date (Current Location): 07/19/2021  [place] and [place] Number:  Kathleen Argue [id number] [place] and Address:  [facility] [place], [place] [address] [place], [place] [address]      Provider Number: [id number]  Attending Physician Name and Address:  Antonieta Pert, MD  Relative Name and Phone Number:  Sherran Needs [phone]  [phone]    Current Level of Care: Hospital Recommended Level of Care: [place] Prior Approval Number:    Date Approved/Denied:   PASRR Number: [id number]  Discharge Plan: SNF    Current Diagnoses: Patient Active Problem List   Diagnosis Date Noted   Acute respiratory failure ([place]) 07/20/2021   History of stroke 07/19/2021   Influenza 07/19/2021   Extension of stroke ([place]) 04/16/2020   Dyslipidemia    Stage 3a chronic kidney disease ([place])    Demand ischemia ([place])    Upper back pain 04/08/2020   Thrombocytopenia ([place]) 04/08/2020   Acute CVA (cerebrovascular accident) ([place]) 04/07/2020   TIA (transient ischemic attack) 03/09/2019   Pressure injury of skin 05/21/2018   Incarcerated incisional hernia - reduced 05/19/2018   SBO (small bowel obstruction) ([place]) 05/18/2018   Bilateral primary osteoarthritis of knee 07/22/2017   Hyperlipidemia 05/20/2014   Hypotension 08/02/2013   UTI (lower urinary tract infection) 08/02/2013   Acute renal failure ([place]) 08/02/2013   ARF (acute renal failure) ([place]) 08/02/2013   Calculus of gallbladder with acute cholecystitis, without mention of obstruction 07/10/2013   Acute cholecystitis 06/17/2013   Chest pain 06/17/2013   Pulmonary infiltrate in left lung on chest x-ray 06/17/2013   Hypertension    CAD (coronary artery disease)     Orientation RESPIRATION BLADDER Height & Weight     Self, Time, Place  O2 Incontinent, External  catheter Weight:   Height:     BEHAVIORAL SYMPTOMS/MOOD NEUROLOGICAL BOWEL NUTRITION STATUS      Incontinent Diet (see discharge summary)  AMBULATORY STATUS COMMUNICATION OF NEEDS Skin   Total Care Verbally Normal                       Personal Care Assistance Level of Assistance  Bathing, Feeding, Dressing Bathing Assistance: Maximum assistance Feeding assistance: Limited assistance Dressing Assistance: Maximum assistance     Functional Limitations Info  Sight, Hearing, Speech Sight Info: Adequate Hearing Info: Impaired Speech Info: Adequate    SPECIAL CARE FACTORS FREQUENCY                       Contractures Contractures Info: Not present    Additional Factors Info  Code Status, Allergies Code Status Info: DNR Allergies Info: Felodipine, Prednisone, Shrimp (Shellfish Allergy), Codeine, Crestor (Rosuvastatin), Feldene (Piroxicam), Lescol (Fluvastatin), Lipitor (Atorvastatin), Nexium (Esomeprazole), Pravachol (Pravastatin), Prilosec Otc (Omeprazole Magnesium), Statins, Welchol (Colesevelam), Zetia (Ezetimibe), Zithromax (Azithromycin), Zocor (Simvastatin), Omeprazole           Current Medications (07/22/2021):  This is the current hospital active medication list Current Facility-Administered Medications  Medication Dose Route Frequency Provider Last Rate Last Admin   acetaminophen (TYLENOL) tablet 650 mg  650 mg Oral Q6H PRN Kc, Ramesh, MD   650 mg at 07/21/21 0409   Or   acetaminophen (TYLENOL) suppository 650 mg  650 mg Rectal Q6H PRN Kc, Ramesh, MD       albuterol (PROVENTIL) (2.5 MG/3ML) 0.083% nebulizer  solution 2.5 mg  2.5 mg Nebulization Q2H PRN Kc, Ramesh, MD   2.5 mg at 07/22/21 0832   allopurinol (ZYLOPRIM) tablet 100 mg  100 mg Oral Daily Kc, Ramesh, MD   100 mg at 07/22/21 0834   ALPRAZolam (XANAX) tablet 0.25 mg  0.25 mg Oral QHS PRN Kc, Maren [place], MD   0.25 mg at 07/21/21 0410   aspirin EC tablet 81 mg  81 mg Oral Daily Kc, Ramesh, MD   81 mg at  07/22/21 0834   benzonatate (TESSALON) capsule 200 mg  200 mg Oral TID PRN Antonieta Pert, MD   200 mg at 07/22/21 0834   budesonide (PULMICORT) nebulizer solution 0.25 mg  0.25 mg Nebulization BID Kc, Ramesh, MD   0.25 mg at 07/22/21 1030   diclofenac Sodium (VOLTAREN) 1 % topical gel 2 g  2 g Topical TID Kc, Ramesh, MD   2 g at 07/22/21 0836   enoxaparin (LOVENOX) injection 30 mg  30 mg Subcutaneous Q24H Kc, Ramesh, MD   30 mg at 07/21/21 2137   guaiFENesin (MUCINEX) 12 hr tablet 600 mg  600 mg Oral BID Kc, Ramesh, MD   600 mg at 07/22/21 0834   guaiFENesin (ROBITUSSIN) 100 MG/5ML liquid 5 mL  5 mL Oral Q4H PRN Kc, Ramesh, MD   5 mL at 07/22/21 0833   hydrALAZINE (APRESOLINE) injection 10 mg  10 mg Intravenous Q6H PRN Kc, Maren Beach, MD   10 mg at 07/21/21 1534   hydrALAZINE (APRESOLINE) tablet 25 mg  25 mg Oral Q8H Kc, Ramesh, MD   25 mg at 07/22/21 0523   ipratropium-albuterol (DUONEB) 0.5-2.5 (3) MG/3ML nebulizer solution 3 mL  3 mL Nebulization TID Kc, Maren Beach, MD   3 mL at 07/22/21 0823   labetalol (NORMODYNE) tablet 50 mg  50 mg Oral BID Kc, Maren Beach, MD   50 mg at 07/22/21 0835   levothyroxine (SYNTHROID) tablet 50 mcg  50 mcg Oral QAC breakfast Kc, Maren Beach, MD   50 mcg at 07/22/21 0523   [START ON 07/23/2021] methylPREDNISolone sodium succinate (SOLU-MEDROL) 40 mg/mL injection 40 mg  40 mg Intravenous Q12H Kc, Ramesh, MD       omega-3 acid ethyl esters (LOVAZA) capsule 1 g  1 g Oral QODAY Heloise Purpura, RPH   1 g at 07/21/21 0842   ondansetron (ZOFRAN) tablet 4 mg  4 mg Oral Q6H PRN Kc, Ramesh, MD       Or   ondansetron (ZOFRAN) injection 4 mg  4 mg Intravenous Q6H PRN Kc, Ramesh, MD       oseltamivir (TAMIFLU) capsule 30 mg  30 mg Oral Daily Heloise Purpura, RPH   30 mg at 07/22/21 0835   pantoprazole (PROTONIX) EC tablet 20 mg  20 mg Oral Daily Kc, Ramesh, MD   20 mg at 07/22/21 0834   pregabalin (LYRICA) capsule 50 mg  50 mg Oral Daily Kc, Ramesh, MD   50 mg at 07/22/21 3790   sertraline  (ZOLOFT) tablet 25 mg  25 mg Oral Daily Antonieta Pert, MD   25 mg at 07/22/21 2409     Discharge Medications: Please see discharge summary for a list of discharge medications.  Relevant Imaging Results:  Relevant Lab Results:   Additional Information SS#[id number] [phone]. Pt is vaccinated for covid with 2 boosters.  Joanne Chars, LCSW

## 2021-07-22 NOTE — Progress Notes (Signed)
Prn neb given to patient due to sob and exp wheezing.

## 2021-07-22 NOTE — Progress Notes (Signed)
PROGRESS NOTE    [name]  EUM:[id number] DOB: 21-Sep-1928 DOA: 07/19/2021 PCP: Garwin Brothers, MD   Chief Complaint  Patient presents with   Altered Mental Status  Brief Narrative/Hospital Course: [name], 87 y.o. female with PMH of HTN/HLD/history of stroke, CAD, CKD stage II, sent from [facility] facility for evaluation of altered mental status. Per EMS and baseline AAO x4 but was confused today, on arrival for EMS patient was alert to person place date and events, was having constant cough dry hacking and also hypoxic 85 percent on room air.  Apparently she is in constant nebulizer at the facility.   In the ED afebrile, again was hypoxic 85% placed on 3 L nasal cannula with improvement, labs revealed stable CBC but creatinine elevated 1.6, influenza came back positive, chest x-ray no acute finding CT head with old left MCA stroke.  Patient was tachypneic and wheezing placed on nebulizer bronchodilators Tamiflu and admission requested  Patient was admitted management IV fluid hydration steroids bronchodilators Tamiflu   subjective: Overnight short of breath wheezing extra nebulizer treatment given.  Nursing report patient was short of breath earlier but when I came and saw her she was resting comfortably appeared calm, is very hard of hearing not in distress. Labs shows improvement, rest of the vitals stable Still on 2 L nasal cannula  Assessment & Plan:  Acute hypoxic respiratory failure Influenza A infection: Reactive airway disease/Chronic Bronchitis-non smoker: No known history of COPD/no smoking history, wheezing extensively and also needing supplemental oxygen suspect reactive airway disease in the setting of influenza infection.chest x-ray no pneumonia on admission.  Wheezing somewhat better this morning but he still short of breath and needing supplemental oxygen.  Add Pulmicort  nebs, continue with IV Solu-Medrol-cut down to 40 every 12 hours from 12/8, cont  bronchodilators -scheduled duoneb and prn albuterol,continue Tamiflu.Encourage PT OT  Acute metabolic encephalopathy:resolved.CT head with chronic stroke.  Continue delirium precautions and supportive care.    Deconditioning: Continue PT OT   Hypertension:BP poorly controlled/accelerated at times added oral hydralazine-increase to 50 mg 3 times daily, continue with her home labetalol-cont PRNs.  CAD HLD History of stroke ?? A flutter on EKG: Has prolonged QTC monitor EKG, there is question of a flutter versus junctional rhythm repeating EKG.  Cont her home Aspirin.  She is high risk for anticoagulation and we will hold off on anticoagulation if even if she has A. fib, will need to be evaluated by her primary care doctor or primary cardiologist as outpatient.  Hypothyroidism-TSH suppressed 0.29, free T4 is high at 1.4, she is on Synthroid 75 MCG we will cut down the dose to 50 mcg repeat TSH in 3 weeks   AKI on CKD VQ:MGQQPYPP creatinine 0.8 04/17/20: AKI has resolved with IV fluid hydration.  Encourage oral hydration discontinue IV fluids  Recent Labs  Lab 07/19/21 1451 07/20/21 0240 07/21/21 0658 07/22/21 0052  BUN 25* 30* 40* 38*  CREATININE 1.62* 1.44* 1.02* 0.96     Ambulatory dysfunction-has knee issues,does not walk  DVT prophylaxis: enoxaparin (LOVENOX) injection 30 mg Start: 07/19/21 2000 Code Status:   Code Status: DNR Family Communication: plan of care discussed with patient's son on phone previously. Status is: Inpatient Remains hospitalized for ongoing needs for reactive airway disease with influenza A positive wheezing hypoxic   Disposition: Currently not medically stable for discharge. Anticipated Disposition: Back to her long-term facility   Objective: Vitals last 24 hrs: Vitals:   07/21/21 2145 07/22/21 0045  07/22/21 0416 07/22/21 0541  BP:  (!) 160/57 (!) 155/77   Pulse: (!) 110  (!) 48   Resp:   18   Temp:   97.6 F (36.4 C)   TempSrc:   Oral   SpO2:    91% 94%   Weight change:   Intake/Output Summary (Last 24 hours) at 07/22/2021 0737 Last data filed at 07/21/2021 2156 Gross per 24 hour  Intake 120 ml  Output 500 ml  Net -380 ml    Net IO Since Admission: 677.54 mL [07/22/21 0737]   Physical Examination: General exam: AAOx at baseline elderly female, hard of hearing. HEENT:Oral mucosa moist, Ear/Nose WNL grossly, dentition normal. Respiratory system: bilaterally air entry present with wheezing, no use of accessory muscle Cardiovascular system: S1 & S2 +, No JVD,. Gastrointestinal system: Abdomen soft,NT,ND, BS+ Nervous System:Alert, awake, moving extremities and grossly nonfocal Extremities: No edema, distal peripheral pulses palpable.  Skin: No rashes,no icterus. MSK: Normal muscle bulk,tone, power  .  Medications reviewed:  Scheduled Meds:  allopurinol  100 mg Oral Daily   aspirin EC  81 mg Oral Daily   diclofenac Sodium  2 g Topical TID   enoxaparin (LOVENOX) injection  30 mg Subcutaneous Q24H   guaiFENesin  600 mg Oral BID   hydrALAZINE  25 mg Oral Q8H   ipratropium-albuterol  3 mL Nebulization TID   labetalol  50 mg Oral BID   levothyroxine  50 mcg Oral QAC breakfast   methylPREDNISolone (SOLU-MEDROL) injection  120 mg Intravenous Q24H   omega-3 acid ethyl esters  1 g Oral QODAY   oseltamivir  30 mg Oral Daily   pantoprazole  20 mg Oral Daily   pregabalin  50 mg Oral Daily   sertraline  25 mg Oral Daily   Continuous Infusions:  Diet Order             DIET SOFT Room service appropriate? Yes; Fluid consistency: Thin  Diet effective now                          Weight change:   Wt Readings from Last 3 Encounters:  04/17/20 69.9 kg  04/08/20 64.1 kg  10/24/19 67.9 kg     Consultants:see note  Procedures:see note Antimicrobials: Anti-infectives (From admission, onward)    Start     Dose/Rate Route Frequency Ordered Stop   07/19/21 2200  nitrofurantoin (MACRODANTIN) capsule 100 mg  Status:   Discontinued        100 mg Oral Every 12 hours 07/19/21 1743 07/19/21 1817   07/19/21 1815  oseltamivir (TAMIFLU) capsule 30 mg        30 mg Oral Daily 07/19/21 1811 07/24/21 0959      Culture/Microbiology    Component Value Date/Time   SDES URINE, RANDOM 04/08/2020 1830   SPECREQUEST NONE 04/08/2020 1830   CULT (A) 04/08/2020 1830    <10,000 COLONIES/mL INSIGNIFICANT GROWTH Performed at [facility] [address] [place], [place] [address]    REPTSTATUS 04/10/2020 FINAL 04/08/2020 1830    Other culture-see note  Unresulted Labs (From admission, onward)     Start     Ordered   07/26/21 0500  Creatinine, serum  (enoxaparin (LOVENOX)    CrCl >/= 30 ml/min)  Weekly,   R     Comments: while on enoxaparin therapy    07/19/21 1727   07/22/21 6789  Basic metabolic panel  Daily,   R  Question:  Specimen collection method  Answer:  Lab=Lab collect   07/21/21 0734   07/22/21 0500  CBC  Daily,   R     Question:  Specimen collection method  Answer:  Lab=Lab collect   07/21/21 0734   07/19/21 1451  Urinalysis, Routine w reflex microscopic  Once,   STAT        07/19/21 1450          Data Reviewed: I have personally reviewed following labs and imaging studies CBC: Recent Labs  Lab 07/19/21 1451 07/20/21 0240 07/22/21 0052  WBC 6.6 6.3 9.6  NEUTROABS 4.2  --   --   HGB 15.0 13.7 13.7  HCT 44.6 41.8 41.8  MCV 93.7 93.1 93.1  PLT 224 203 193    Basic Metabolic Panel: Recent Labs  Lab 07/19/21 1451 07/20/21 0240 07/21/21 0658 07/22/21 0052  NA 136 131* 134* 136  K 4.0 3.9 3.9 4.1  CL 96* 96* 99 99  CO2 28 25 25 26   GLUCOSE 114* 219* 134* 143*  BUN 25* 30* 40* 38*  CREATININE 1.62* 1.44* 1.02* 0.96  CALCIUM 10.0 9.2 9.6 9.5    GFR: CrCl cannot be calculated (Unknown ideal weight.). Liver Function Tests: Recent Labs  Lab 07/19/21 1451 07/20/21 0240  AST 41 43*  ALT 15 17  ALKPHOS 73 63  BILITOT 0.8 0.6  PROT 6.3* 5.8*  ALBUMIN 3.5 3.0*     No results for input(s): LIPASE, AMYLASE in the last 168 hours. No results for input(s): AMMONIA in the last 168 hours. Coagulation Profile: No results for input(s): INR, PROTIME in the last 168 hours. Cardiac Enzymes: No results for input(s): CKTOTAL, CKMB, CKMBINDEX, TROPONINI in the last 168 hours. BNP (last 3 results) No results for input(s): PROBNP in the last 8760 hours. HbA1C: No results for input(s): HGBA1C in the last 72 hours. CBG: No results for input(s): GLUCAP in the last 168 hours. Lipid Profile: No results for input(s): CHOL, HDL, LDLCALC, TRIG, CHOLHDL, LDLDIRECT in the last 72 hours. Thyroid Function Tests: Recent Labs    07/20/21 0240  TSH 0.295*  FREET4 1.49*    Anemia Panel: No results for input(s): VITAMINB12, FOLATE, FERRITIN, TIBC, IRON, RETICCTPCT in the last 72 hours. Sepsis Labs: No results for input(s): PROCALCITON, LATICACIDVEN in the last 168 hours.  Recent Results (from the past 240 hour(s))  Resp Panel by RT-PCR (Flu A&B, Covid) Nasopharyngeal Swab     Status: Abnormal   Collection Time: 07/19/21  2:52 PM   Specimen: Nasopharyngeal Swab; Nasopharyngeal(NP) swabs in vial transport medium  Result Value Ref Range Status   SARS Coronavirus 2 by RT PCR NEGATIVE NEGATIVE Final    Comment: (NOTE) SARS-CoV-2 target nucleic acids are NOT DETECTED.  The SARS-CoV-2 RNA is generally detectable in upper respiratory specimens during the acute phase of infection. The lowest concentration of SARS-CoV-2 viral copies this assay can detect is 138 copies/mL. A negative result does not preclude SARS-Cov-2 infection and should not be used as the sole basis for treatment or other patient management decisions. A negative result may occur with  improper specimen collection/handling, submission of specimen other than nasopharyngeal swab, presence of viral mutation(s) within the areas targeted by this assay, and inadequate number of viral copies(<138 copies/mL).  A negative result must be combined with clinical observations, patient history, and epidemiological information. The expected result is Negative.  Fact Sheet for Patients:  EntrepreneurPulse.com.au  Fact Sheet for Healthcare Providers:  IncredibleEmployment.be  This test is  no t yet approved or cleared by the [place] and  has been authorized for detection and/or diagnosis of SARS-CoV-2 by FDA under an Emergency Use Authorization (EUA). This EUA will remain  in effect (meaning this test can be used) for the duration of the COVID-19 declaration under Section 564(b)(1) of the Act, 21 U.S.C.section 360bbb-3(b)(1), unless the authorization is terminated  or revoked sooner.       Influenza A by PCR POSITIVE (A) NEGATIVE Final   Influenza B by PCR NEGATIVE NEGATIVE Final    Comment: (NOTE) The Xpert Xpress SARS-CoV-2/FLU/RSV plus assay is intended as an aid in the diagnosis of influenza from Nasopharyngeal swab specimens and should not be used as a sole basis for treatment. Nasal washings and aspirates are unacceptable for Xpert Xpress SARS-CoV-2/FLU/RSV testing.  Fact Sheet for Patients: EntrepreneurPulse.com.au  Fact Sheet for Healthcare Providers: IncredibleEmployment.be  This test is not yet approved or cleared by the [place] FDA and has been authorized for detection and/or diagnosis of SARS-CoV-2 by FDA under an Emergency Use Authorization (EUA). This EUA will remain in effect (meaning this test can be used) for the duration of the COVID-19 declaration under Section 564(b)(1) of the Act, 21 U.S.C. section 360bbb-3(b)(1), unless the authorization is terminated or revoked.  Performed at [facility], [place] [address], [place], [place] [address]   MRSA Next Gen by PCR, Nasal     Status: None   Collection Time: 07/20/21  8:46 PM   Specimen: Nasal Mucosa; Nasal Swab  Result Value Ref  Range Status   MRSA by PCR Next Gen NOT DETECTED NOT DETECTED Final    Comment: (NOTE) The GeneXpert MRSA Assay (FDA approved for NASAL specimens only), is one component of a comprehensive MRSA colonization surveillance program. It is not intended to diagnose MRSA infection nor to guide or monitor treatment for MRSA infections. Test performance is not FDA approved in patients less than 9 years old. Performed at [facility], [place] [address], [place], [place] [address]       Radiology Studies: No results found.   LOS: 2 days   Antonieta Pert, MD [facility]  07/22/2021, 7:37 AM

## 2021-07-22 NOTE — TOC Initial Note (Signed)
Transition of Care [facility]) - Initial/Assessment Note    Patient Details  Name: [name] MRN: [id number] Date of Birth: [birth year]  Transition of Care [facility]) CM/SW Contact:    Joanne Chars, LCSW Phone Number: 07/22/2021, 10:24 AM  Clinical Narrative:     CSW met with pt for initial assessment.  Pt very hard of hearing, unable to give much information, does give permission to speak with son [name].  CSW reached son by phone.  He confirmed that pt is in long term care at [facility] and he would like her to return at [facility].  Pt is vaccinated for covid with 2 boosters.  CSW confirmed with Claiborne Billings at [facility] that pt is resident there and able to return at [facility].              Expected Discharge Plan: Long Term Nursing Home Barriers to Discharge: Continued Medical Work up   Patient Goals and CMS Choice        Expected Discharge Plan and Services Expected Discharge Plan: [place] Acute Care Choice: [place] Living arrangements for the past 2 months: [place]                                      Prior Living Arrangements/Services Living arrangements for the past 2 months: [place] Lives with:: Facility Resident Patient language and need for interpreter reviewed:: Yes        Need for Family Participation in Patient Care: Yes (Comment) Care giver support system in place?: Yes (comment) Current home services: Other (comment) (na) Criminal Activity/Legal Involvement Pertinent to Current Situation/Hospitalization: No - Comment as needed  Activities of Daily Living Home Assistive Devices/Equipment: Wheelchair ADL Screening (condition at time of admission) Patient's cognitive ability adequate to safely complete daily activities?: Yes Is the patient deaf or have difficulty hearing?: Yes Does the patient have difficulty seeing, even when wearing glasses/contacts?: No Does the patient have difficulty  concentrating, remembering, or making decisions?: No Patient able to express need for assistance with ADLs?: Yes Does the patient have difficulty dressing or bathing?: Yes Independently performs ADLs?: No Communication: Independent Dressing (OT): Needs assistance Is this a change from baseline?: Pre-admission baseline Grooming: Needs assistance Is this a change from baseline?: Pre-admission baseline Feeding: Independent Bathing: Needs assistance Is this a change from baseline?: Pre-admission baseline Toileting: Needs assistance Is this a change from baseline?: Pre-admission baseline In/Out Bed: Needs assistance Is this a change from baseline?: Pre-admission baseline Walks in Home: Dependent Is this a change from baseline?: Pre-admission baseline Does the patient have difficulty walking or climbing stairs?: Yes Weakness of Legs: None Weakness of Arms/Hands: None  Permission Sought/Granted Permission sought to share information with : Family Supports Permission granted to share information with : Yes, Verbal Permission Granted  Share Information with NAME: son [name]           Emotional Assessment Appearance:: Appears stated age Attitude/Demeanor/Rapport: Unable to Assess Affect (typically observed): Unable to Assess Orientation: : Oriented to Self, Oriented to Place, Oriented to  Time Alcohol / Substance Use: Not Applicable Psych Involvement: No (comment)  Admission diagnosis:  COPD exacerbation ([place]) [J44.1] COPD with acute exacerbation ([place]) [J44.1] Acute respiratory failure ([place]) [J96.00] Patient Active Problem List   Diagnosis Date Noted   Acute respiratory failure ([place]) 07/20/2021   History of stroke 07/19/2021  Influenza 07/19/2021   Extension of stroke ([place]) 04/16/2020   Dyslipidemia    Stage 3a chronic kidney disease ([place])    Demand ischemia ([place])    Upper back pain 04/08/2020   Thrombocytopenia ([place]) 04/08/2020   Acute CVA (cerebrovascular accident) ([place])  04/07/2020   TIA (transient ischemic attack) 03/09/2019   Pressure injury of skin 05/21/2018   Incarcerated incisional hernia - reduced 05/19/2018   SBO (small bowel obstruction) ([place]) 05/18/2018   Bilateral primary osteoarthritis of knee 07/22/2017   Hyperlipidemia 05/20/2014   Hypotension 08/02/2013   UTI (lower urinary tract infection) 08/02/2013   Acute renal failure ([place]) 08/02/2013   ARF (acute renal failure) ([place]) 08/02/2013   Calculus of gallbladder with acute cholecystitis, without mention of obstruction 07/10/2013   Acute cholecystitis 06/17/2013   Chest pain 06/17/2013   Pulmonary infiltrate in left lung on chest x-ray 06/17/2013   Hypertension    CAD (coronary artery disease)    PCP:  Garwin Brothers, MD Pharmacy:   [facility] [place], [place] - [address] [address] AT [facility] [address] [place] [address] Phone: [phone] Fax: [phone]  [facility]) - [place], [place] [address] Phone: [phone] Fax: [phone]     Social Determinants of Health (SDOH) Interventions    Readmission Risk Interventions No flowsheet data found.

## 2021-07-23 DIAGNOSIS — J111 Influenza due to unidentified influenza virus with other respiratory manifestations: Secondary | ICD-10-CM | POA: Diagnosis not present

## 2021-07-23 LAB — BASIC METABOLIC PANEL
Anion gap: 9 (ref 5–15)
BUN: 35 mg/dL — ABNORMAL HIGH (ref 8–23)
CO2: 22 mmol/L (ref 22–32)
Calcium: 9.8 mg/dL (ref 8.9–10.3)
Chloride: 99 mmol/L (ref 98–111)
Creatinine, Ser: 0.89 mg/dL (ref 0.44–1.00)
GFR, Estimated: >60 mL/min (ref >60)
Glucose, Bld: 126 mg/dL — ABNORMAL HIGH (ref 70–99)
Potassium: 4.4 mmol/L (ref 3.5–5.1)
Sodium: 130 mmol/L — ABNORMAL LOW (ref 135–145)

## 2021-07-23 LAB — SARS CORONAVIRUS 2 (TAT 6-24 HRS): SARS Coronavirus 2: NEGATIVE

## 2021-07-23 LAB — CBC
HCT: 43.6 % (ref 36.0–46.0)
Hemoglobin: 14.9 g/dL (ref 12.0–15.0)
MCH: 31 pg (ref 26.0–34.0)
MCHC: 34.2 g/dL (ref 30.0–36.0)
MCV: 90.8 fL (ref 80.0–100.0)
Platelets: 227 10*3/uL (ref 150–400)
RBC: 4.8 MIL/uL (ref 3.87–5.11)
RDW: 15.8 % — ABNORMAL HIGH (ref 11.5–15.5)
WBC: 10.4 10*3/uL (ref 4.0–10.5)
nRBC: 0 % (ref 0.0–0.2)

## 2021-07-23 MED ORDER — HYDRALAZINE HCL 50 MG PO TABS
100.0000 mg | ORAL_TABLET | Freq: Three times a day (TID) | ORAL | Status: DC
  Administered 2021-07-23 – 2021-07-28 (×14): 100 mg via ORAL
  Filled 2021-07-23 (×16): qty 2

## 2021-07-23 MED ORDER — HYDRALAZINE HCL 50 MG PO TABS
50.0000 mg | ORAL_TABLET | ORAL | Status: AC
  Administered 2021-07-23: 50 mg via ORAL
  Filled 2021-07-23: qty 1

## 2021-07-23 MED ORDER — BENZONATATE 100 MG PO CAPS
200.0000 mg | ORAL_CAPSULE | Freq: Three times a day (TID) | ORAL | Status: DC
  Administered 2021-07-23 – 2021-07-28 (×16): 200 mg via ORAL
  Filled 2021-07-23 (×17): qty 2

## 2021-07-23 NOTE — Progress Notes (Signed)
PROGRESS NOTE    [name]  TKZ:[id number] DOB: 21-Feb-1929 DOA: 07/19/2021 PCP: Garwin Brothers, MD   Chief Complaint  Patient presents with   Altered Mental Status  Brief Narrative/Hospital Course: [name], 87 y.o. female with PMH of HTN/HLD/history of stroke, CAD, CKD stage II, sent from [facility] facility for evaluation of altered mental status. Per EMS and baseline AAO x4 but was confused today, on arrival for EMS patient was alert to person place date and events, was having constant cough dry hacking and also hypoxic 85 percent on room air.  Apparently she is in constant nebulizer at the facility.   In the ED afebrile, again was hypoxic 85% placed on 3 L nasal cannula with improvement, labs revealed stable CBC but creatinine elevated 1.6, influenza came back positive, chest x-ray no acute finding CT head with old left MCA stroke.  Patient was tachypneic and wheezing placed on nebulizer bronchodilators Tamiflu and admission requested Patient was admitted management IV fluid hydration steroids bronchodilators Tamiflu   subjective: Seen and examined this morning. Overnight patient had an anxious, short of breath hypertensive, complaining of throat pain.  Morning appears calm comfortable [place]  Assessment & Plan:  Acute hypoxic respiratory failure Influenza A infection: Reactive airway disease/Chronic Bronchitis-non smoker: No known history of COPD/no smoking history, wheezing extensively and also needing supplemental oxygen suspect reactive airway disease in the setting of influenza infection.chest x-ray no pneumonia on admission. Thi am wheezing/shortness of breath is improving compared to previous, still needing oxygen at 2L [place], continue Pulmicort  nebs,IV Solu-Medrol 40 every 12 hours X 24 rs along with Chloraseptic spray Tamiflu.  Encourage PT OT.  Acute metabolic encephalopathy:resolved.CT head with chronic stroke.  Continue delirium precautions and supportive care.     Deconditioning: Appears weak and frail, continue PT OT   Hypertension:BP poorly controlled/accelerated, increase hydralazine further to 1 mg 3 times daily, continue labetalol, unable to increase labetalol more due to bradycardia, if remains high add amlodipine , Cont IV PRNs  CAD/HLD History of stroke ?? A flutter on EKG: Has prolonged QTC monitor EKG, there is question of a flutter versus junctional rhythm repeating EKG.  Cont her home Aspirin.  She is high risk for anticoagulation and we will hold off on anticoagulation if even if she has A. fib, will need to be evaluated by her primary care doctor or primary cardiologist as outpatient.  Hypothyroidism-TSH suppressed 0.29, free T4 is high at 1.4, she is on Synthroid 75 MCG we will cut down the dose to 50 mcg repeat TSH in 3 weeks   AKI on CKD TD:DUKGURKY creatinine 0.8 04/17/20: It has nicely resolved with IV fluid hydration discontinued.  Encourage oral intake Recent Labs  Lab 07/19/21 1451 07/20/21 0240 07/21/21 0658 07/22/21 0052 07/23/21 0425  BUN 25* 30* 40* 38* 35*  CREATININE 1.62* 1.44* 1.02* 0.96 0.89    Ambulatory dysfunction-has knee issues,does not walk  DVT prophylaxis: enoxaparin (LOVENOX) injection 30 mg Start: 07/19/21 2000 Code Status:   Code Status: DNR Family Communication: plan of care discussed with patient's son on phone previously. Status is: Inpatient Remains hospitalized for ongoing needs for reactive airway disease with influenza A positive wheezing hypoxic   Disposition: Currently not medically stable for discharge. Anticipated Disposition: Back to her long-term facility tomorrow if remains stable.send covid test today  Objective: Vitals last 24 hrs: Vitals:   07/22/21 2333 07/23/21 0832 07/23/21 0840 07/23/21 0918  BP: (!) 177/81   (!) 182/95  Pulse:  61   65  Resp:    16  Temp:    98.1 F (36.7 C)  TempSrc:      SpO2: 99% 98% 98% 98%   Weight change:   Intake/Output Summary (Last 24 hours)  at 07/23/2021 1010 Last data filed at 07/22/2021 1900 Gross per 24 hour  Intake --  Output 700 ml  Net -700 ml   Net IO Since Admission: -22.46 mL [07/23/21 1010]   Physical Examination: General exam: AAO, hard of hearing, elderly, weak appearing. HEENT:Oral mucosa moist, Ear/Nose WNL grossly, dentition normal. Respiratory system: bilaterally diminished with wheezing but improved from previous Cardiovascular system: S1 & S2 +, No JVD,. Gastrointestinal system: Abdomen soft,NT,ND, BS+ Nervous System:Alert, awake, moving extremities and grossly nonfocal Extremities: No edema, distal peripheral pulses palpable.  Skin: No rashes,no icterus. MSK: Normal muscle bulk,tone, power   Medications reviewed:  Scheduled Meds:  allopurinol  100 mg Oral Daily   aspirin EC  81 mg Oral Daily   benzonatate  200 mg Oral TID   budesonide (PULMICORT) nebulizer solution  0.25 mg Nebulization BID   diclofenac Sodium  2 g Topical TID   enoxaparin (LOVENOX) injection  30 mg Subcutaneous Q24H   guaiFENesin  600 mg Oral BID   hydrALAZINE  100 mg Oral Q8H   ipratropium-albuterol  3 mL Nebulization TID   labetalol  50 mg Oral BID   levothyroxine  50 mcg Oral QAC breakfast   methylPREDNISolone (SOLU-MEDROL) injection  40 mg Intravenous Q12H   omega-3 acid ethyl esters  1 g Oral QODAY   pantoprazole  20 mg Oral Daily   pregabalin  50 mg Oral Daily   sertraline  25 mg Oral Daily   Continuous Infusions:  Diet Order             DIET SOFT Room service appropriate? Yes; Fluid consistency: Thin  Diet effective now                          Weight change:   Wt Readings from Last 3 Encounters:  04/17/20 69.9 kg  04/08/20 64.1 kg  10/24/19 67.9 kg     Consultants:see note  Procedures:see note Antimicrobials: Anti-infectives (From admission, onward)    Start     Dose/Rate Route Frequency Ordered Stop   07/19/21 2200  nitrofurantoin (MACRODANTIN) capsule 100 mg  Status:  Discontinued         100 mg Oral Every 12 hours 07/19/21 1743 07/19/21 1817   07/19/21 1815  oseltamivir (TAMIFLU) capsule 30 mg        30 mg Oral Daily 07/19/21 1811 07/23/21 0859      Culture/Microbiology    Component Value Date/Time   SDES URINE, RANDOM 04/08/2020 1830   SPECREQUEST NONE 04/08/2020 1830   CULT (A) 04/08/2020 1830    <10,000 COLONIES/mL INSIGNIFICANT GROWTH Performed at [facility] [address] [place], [place] [address]    REPTSTATUS 04/10/2020 FINAL 04/08/2020 1830    Other culture-see note  Unresulted Labs (From admission, onward)     Start     Ordered   07/26/21 0500  Creatinine, serum  (enoxaparin (LOVENOX)    CrCl >/= 30 ml/min)  Weekly,   R     Comments: while on enoxaparin therapy    07/19/21 1727   07/22/21 8099  Basic metabolic panel  Daily,   R     Question:  Specimen collection method  Answer:  Lab=Lab collect  07/21/21 0734   07/22/21 0500  CBC  Daily,   R     Question:  Specimen collection method  Answer:  Lab=Lab collect   07/21/21 0734   07/19/21 1451  Urinalysis, Routine w reflex microscopic  Once,   STAT        07/19/21 1450          Data Reviewed: I have personally reviewed following labs and imaging studies CBC: Recent Labs  Lab 07/19/21 1451 07/20/21 0240 07/22/21 0052 07/23/21 0425  WBC 6.6 6.3 9.6 10.4  NEUTROABS 4.2  --   --   --   HGB 15.0 13.7 13.7 14.9  HCT 44.6 41.8 41.8 43.6  MCV 93.7 93.1 93.1 90.8  PLT 224 203 218 585   Basic Metabolic Panel: Recent Labs  Lab 07/19/21 1451 07/20/21 0240 07/21/21 0658 07/22/21 0052 07/23/21 0425  NA 136 131* 134* 136 130*  K 4.0 3.9 3.9 4.1 4.4  CL 96* 96* 99 99 99  CO2 28 25 25 26 22   GLUCOSE 114* 219* 134* 143* 126*  BUN 25* 30* 40* 38* 35*  CREATININE 1.62* 1.44* 1.02* 0.96 0.89  CALCIUM 10.0 9.2 9.6 9.5 9.8   GFR: CrCl cannot be calculated (Unknown ideal weight.). Liver Function Tests: Recent Labs  Lab 07/19/21 1451 07/20/21 0240  AST 41 43*  ALT 15 17  ALKPHOS  73 63  BILITOT 0.8 0.6  PROT 6.3* 5.8*  ALBUMIN 3.5 3.0*   No results for input(s): LIPASE, AMYLASE in the last 168 hours. No results for input(s): AMMONIA in the last 168 hours. Coagulation Profile: No results for input(s): INR, PROTIME in the last 168 hours. Cardiac Enzymes: No results for input(s): CKTOTAL, CKMB, CKMBINDEX, TROPONINI in the last 168 hours. BNP (last 3 results) No results for input(s): PROBNP in the last 8760 hours. HbA1C: No results for input(s): HGBA1C in the last 72 hours. CBG: Recent Labs  Lab 07/22/21 0818  GLUCAP 115*   Lipid Profile: No results for input(s): CHOL, HDL, LDLCALC, TRIG, CHOLHDL, LDLDIRECT in the last 72 hours. Thyroid Function Tests: No results for input(s): TSH, T4TOTAL, FREET4, T3FREE, THYROIDAB in the last 72 hours.  Anemia Panel: No results for input(s): VITAMINB12, FOLATE, FERRITIN, TIBC, IRON, RETICCTPCT in the last 72 hours. Sepsis Labs: No results for input(s): PROCALCITON, LATICACIDVEN in the last 168 hours.  Recent Results (from the past 240 hour(s))  Resp Panel by RT-PCR (Flu A&B, Covid) Nasopharyngeal Swab     Status: Abnormal   Collection Time: 07/19/21  2:52 PM   Specimen: Nasopharyngeal Swab; Nasopharyngeal(NP) swabs in vial transport medium  Result Value Ref Range Status   SARS Coronavirus 2 by RT PCR NEGATIVE NEGATIVE Final    Comment: (NOTE) SARS-CoV-2 target nucleic acids are NOT DETECTED.  The SARS-CoV-2 RNA is generally detectable in upper respiratory specimens during the acute phase of infection. The lowest concentration of SARS-CoV-2 viral copies this assay can detect is 138 copies/mL. A negative result does not preclude SARS-Cov-2 infection and should not be used as the sole basis for treatment or other patient management decisions. A negative result may occur with  improper specimen collection/handling, submission of specimen other than nasopharyngeal swab, presence of viral mutation(s) within the areas  targeted by this assay, and inadequate number of viral copies(<138 copies/mL). A negative result must be combined with clinical observations, patient history, and epidemiological information. The expected result is Negative.  Fact Sheet for Patients:  EntrepreneurPulse.com.au  Fact Sheet for Healthcare Providers:  IncredibleEmployment.be  This test is no t yet approved or cleared by the [place] and  has been authorized for detection and/or diagnosis of SARS-CoV-2 by FDA under an Emergency Use Authorization (EUA). This EUA will remain  in effect (meaning this test can be used) for the duration of the COVID-19 declaration under Section 564(b)(1) of the Act, 21 U.S.C.section 360bbb-3(b)(1), unless the authorization is terminated  or revoked sooner.       Influenza A by PCR POSITIVE (A) NEGATIVE Final   Influenza B by PCR NEGATIVE NEGATIVE Final    Comment: (NOTE) The Xpert Xpress SARS-CoV-2/FLU/RSV plus assay is intended as an aid in the diagnosis of influenza from Nasopharyngeal swab specimens and should not be used as a sole basis for treatment. Nasal washings and aspirates are unacceptable for Xpert Xpress SARS-CoV-2/FLU/RSV testing.  Fact Sheet for Patients: EntrepreneurPulse.com.au  Fact Sheet for Healthcare Providers: IncredibleEmployment.be  This test is not yet approved or cleared by the [place] FDA and has been authorized for detection and/or diagnosis of SARS-CoV-2 by FDA under an Emergency Use Authorization (EUA). This EUA will remain in effect (meaning this test can be used) for the duration of the COVID-19 declaration under Section 564(b)(1) of the Act, 21 U.S.C. section 360bbb-3(b)(1), unless the authorization is terminated or revoked.  Performed at [facility], [place] [address], [place], [place] [address]   MRSA Next Gen by PCR, Nasal     Status: None   Collection  Time: 07/20/21  8:46 PM   Specimen: Nasal Mucosa; Nasal Swab  Result Value Ref Range Status   MRSA by PCR Next Gen NOT DETECTED NOT DETECTED Final    Comment: (NOTE) The GeneXpert MRSA Assay (FDA approved for NASAL specimens only), is one component of a comprehensive MRSA colonization surveillance program. It is not intended to diagnose MRSA infection nor to guide or monitor treatment for MRSA infections. Test performance is not FDA approved in patients less than 87 years old. Performed at [facility], [place] [address], [place],  [address]       Radiology Studies: No results found.   LOS: 3 days   Antonieta Pert, MD [facility]  07/23/2021, 10:10 AM

## 2021-07-23 NOTE — Progress Notes (Addendum)
Pt's BP has been elevated. PRN and scheduled BP meds were given. Pt is very anxious and tearful repetitively says "I can't take this anymore", also complains of sore throat and pain on her knees. Chloraseptic spray, xanax and tylenol were given. Pt also noted to be coughing a lot and has some expiratory wheezes. PRN cough medicine and breathing treatment were given. Pt is very hard of hearing. RN communicates to pt with written words on paper, pt able to tell needs. Will continue to monitor pt.

## 2021-07-23 NOTE — Progress Notes (Addendum)
Patient appears calm and resting comfortably on bed at this time.

## 2021-07-23 NOTE — Plan of Care (Signed)

## 2021-07-24 ENCOUNTER — Inpatient Hospital Stay (HOSPITAL_COMMUNITY): Payer: Medicare HMO

## 2021-07-24 DIAGNOSIS — J111 Influenza due to unidentified influenza virus with other respiratory manifestations: Secondary | ICD-10-CM | POA: Diagnosis not present

## 2021-07-24 LAB — BASIC METABOLIC PANEL
Anion gap: 10 (ref 5–15)
BUN: 36 mg/dL — ABNORMAL HIGH (ref 8–23)
CO2: 25 mmol/L (ref 22–32)
Calcium: 9.9 mg/dL (ref 8.9–10.3)
Chloride: 97 mmol/L — ABNORMAL LOW (ref 98–111)
Creatinine, Ser: 0.98 mg/dL (ref 0.44–1.00)
GFR, Estimated: 55 mL/min — ABNORMAL LOW (ref >60)
Glucose, Bld: 163 mg/dL — ABNORMAL HIGH (ref 70–99)
Potassium: 4.2 mmol/L (ref 3.5–5.1)
Sodium: 132 mmol/L — ABNORMAL LOW (ref 135–145)

## 2021-07-24 LAB — CBC
HCT: 42.6 % (ref 36.0–46.0)
Hemoglobin: 14.7 g/dL (ref 12.0–15.0)
MCH: 31.4 pg (ref 26.0–34.0)
MCHC: 34.5 g/dL (ref 30.0–36.0)
MCV: 91 fL (ref 80.0–100.0)
Platelets: 217 10*3/uL (ref 150–400)
RBC: 4.68 MIL/uL (ref 3.87–5.11)
RDW: 15.8 % — ABNORMAL HIGH (ref 11.5–15.5)
WBC: 10.2 10*3/uL (ref 4.0–10.5)
nRBC: 0 % (ref 0.0–0.2)

## 2021-07-24 MED ORDER — HYDROCHLOROTHIAZIDE 25 MG PO TABS
25.0000 mg | ORAL_TABLET | Freq: Every day | ORAL | Status: DC
  Administered 2021-07-24 – 2021-07-28 (×5): 25 mg via ORAL
  Filled 2021-07-24 (×4): qty 1

## 2021-07-24 MED ORDER — ALPRAZOLAM 0.25 MG PO TABS
0.2500 mg | ORAL_TABLET | Freq: Two times a day (BID) | ORAL | Status: DC | PRN
  Administered 2021-07-24: 0.25 mg via ORAL
  Filled 2021-07-24: qty 1

## 2021-07-24 MED ORDER — MENTHOL 3 MG MT LOZG
1.0000 | LOZENGE | OROMUCOSAL | Status: DC | PRN
  Administered 2021-07-24: 3 mg via ORAL
  Filled 2021-07-24: qty 9

## 2021-07-24 NOTE — Plan of Care (Signed)

## 2021-07-24 NOTE — TOC Progression Note (Signed)
Transition of Care [facility]) - Progression Note    Patient Details  Name: [name] MRN: [id number] Date of Birth: 1928/10/16  [facility] CM/SW Contact  Joanne Chars, LCSW Phone Number: 07/24/2021, 2:24 PM  Clinical Narrative:   Pt not stable for DC today.  Claiborne Billings at [facility] informed.  They cannot accept admissions over the weekend, will need to wait until Monday.  MD informed.      Expected Discharge Plan: Long Term Nursing Home Barriers to Discharge: Continued Medical Work up  Expected Discharge Plan and Services Expected Discharge Plan: [facility] Acute Care Choice: [place] arrangements for the past 2 months: [facility]                                       Social Determinants of Health (SDOH) Interventions    Readmission Risk Interventions No flowsheet data found.

## 2021-07-24 NOTE — Progress Notes (Addendum)
Occupational Therapy Treatment Patient Details Name: [name] MRN: [id number] DOB: 04-Apr-1929 Today's Date: 07/24/2021   History of present illness Pt is a 87 y.o. female admitted from [facility] SNF 12/4 with AMS, influenza+. PMH:  HTN, HLD, h/o stroke, CAD, CKD stage II, OA, hearing loss   OT comments  Pt set up for bed level grooming tasks this session, able to perform assisted BUE shoulder flexion x5 reps, pt does ~50%. Pt max A to roll LB to perform pad change, pt able to use UB to hold railing when rolling L/R. Pt groaning, grimacing towards end of session, appeared in mild distressed. Pt on 2LO2 during session, SpO2 above 93% and VSS throughout. Pt reported difficulty breathing, RN immediately notified, in room at end of session to provide breathing treatment. Pt continues to be limited by decreased activity tolerance at this time, will continue to follow acutely. Recommend SNF at d/c.   Recommendations for follow up therapy are one component of a multi-disciplinary discharge planning process, led by the attending physician.  Recommendations may be updated based on patient status, additional functional criteria and insurance authorization.    Follow Up Recommendations  Skilled nursing-short term rehab (<3 hours/day)    Assistance Recommended at Discharge Intermittent Supervision/Assistance  Equipment Recommendations  None recommended by OT;Other (comment) (TBD at next venue of care)    Recommendations for Other Services      Precautions / Restrictions Precautions Precautions: Fall;Other (comment) Precaution Comments: bradycardia Restrictions Weight Bearing Restrictions: No       Mobility Bed Mobility Overal bed mobility: Needs Assistance Bed Mobility: Rolling Rolling: Total assist         General bed mobility comments: pt able to roll L/R for pad change with max A for LB movement    Transfers                   General transfer comment: transfers via  hoyer lift at baseline     Balance                                           ADL either performed or assessed with clinical judgement   ADL   Eating/Feeding: Set up;Bed level   Grooming: Set up;Bed level                       Toileting- Clothing Manipulation and Hygiene: Maximal assistance;Sitting/lateral lean Toileting - Clothing Manipulation Details (indicate cue type and reason): max A for rolling to replace pad            Extremity/Trunk Assessment Upper Extremity Assessment Upper Extremity Assessment: Generalized weakness   Lower Extremity Assessment Lower Extremity Assessment: Defer to PT evaluation        Vision   Vision Assessment?: No apparent visual deficits   Perception Perception Perception: Not tested   Praxis Praxis Praxis: Not tested    Cognition Arousal/Alertness: Lethargic Behavior During Therapy: WFL for tasks assessed/performed Overall Cognitive Status: Difficult to assess                                            Exercises Exercises: General Upper Extremity General Exercises - Upper Extremity Shoulder Flexion: AROM;5 reps;Both;Other (comment) (in long sitting/reclined in bed)   Shoulder  Instructions       General Comments SpO2 in the 90's throughout session, Pt on 2LO2 via [place], pt groaning and becoming mildly distressed during session, requesting water. RN notified, in room at end of session to provide breathing treatment    Pertinent Vitals/ Pain       Pain Assessment: Faces Pain Score: 4  Faces Pain Scale: Hurts little more Pain Location: generalized Pain Descriptors / Indicators: Constant;Discomfort;Grimacing Pain Intervention(s): Limited activity within patient's tolerance;Monitored during session;Premedicated before session;Other (comment) (RN gave breathing treatment during session)  Home Living                                          Prior  Functioning/Environment              Frequency  Min 2X/week        Progress Toward Goals  OT Goals(current goals can now be found in the care plan section)  Progress towards OT goals: Progressing toward goals  ADL Goals Pt Will Perform Eating: with set-up;bed level Pt Will Perform Grooming: with set-up;bed level Pt Will Perform Upper Body Dressing: with min assist;with mod assist;bed level  Plan Discharge plan remains appropriate;Frequency remains appropriate    Co-evaluation                 AM-PAC OT "6 Clicks" Daily Activity     Outcome Measure   Help from another person eating meals?: A Little Help from another person taking care of personal grooming?: A Little Help from another person toileting, which includes using toliet, bedpan, or urinal?: Total Help from another person bathing (including washing, rinsing, drying)?: Total Help from another person to put on and taking off regular upper body clothing?: Total Help from another person to put on and taking off regular lower body clothing?: Total 6 Click Score: 10    End of Session Equipment Utilized During Treatment: Oxygen  OT Visit Diagnosis: Muscle weakness (generalized) (M62.81)   Activity Tolerance Patient limited by fatigue;Treatment limited secondary to medical complications (Comment) (pt needed breathing treatment)   Patient Left in bed;with call bell/phone within reach;with bed alarm set   Nurse Communication Mobility status        Time: 3403-5248 OT Time Calculation (min): 34 min  Charges: OT General Charges $OT Visit: 1 Visit OT Treatments $Self Care/Home Management : 8-22 mins $Therapeutic Activity: 8-22 mins  Lynnda Child, OTD, OTR/L Acute Rehab [phone] - [place] 07/24/2021, 10:32 AM

## 2021-07-24 NOTE — Progress Notes (Signed)
PROGRESS NOTE    [name]  ATF:[id number] DOB: August 02, 1929 DOA: 07/19/2021 PCP: Garwin Brothers, MD   Chief Complaint  Patient presents with   Altered Mental Status  Brief Narrative/Hospital Course: [name], 87 y.o. female with PMH of HTN/HLD/history of stroke, CAD, CKD stage II, sent from [facility] facility for evaluation of altered mental status. Per EMS and baseline AAO x4 but was confused today, on arrival for EMS patient was alert to person place date and events, was having constant cough dry hacking and also hypoxic 85 percent on room air.  Apparently she is in constant nebulizer at the facility.   In the ED afebrile, again was hypoxic 85% placed on 3 L nasal cannula with improvement, labs revealed stable CBC but creatinine elevated 1.6, influenza came back positive, chest x-ray no acute finding CT head with old left MCA stroke.  Patient was tachypneic and wheezing placed on nebulizer bronchodilators Tamiflu and admission requested Patient was admitted management IV fluid hydration steroids bronchodilators Tamiflu   subjective: Seen and examined this morning.  This afternoon patient had episodes with short of breath chest pain cough throat pain, patient panicked. She did calm down after Tylenol, Cepacol lozenges along with nebulizer Reexamined this afternoon resting comfortably On oxygen still. Assessment & Plan:  Acute hypoxic respiratory failure Influenza A infection: Reactive airway disease/Chronic Bronchitis-non smoker: No known history of COPD/no smoking history, wheezing extensively and also needing supplemental oxygen suspect reactive airway disease in the setting of influenza infection.chest x-ray no pneumonia on admission.  Repeat x-ray ordered this morning due to episodes of shortness of breath coughing spell, overall wheezing is much better continue with current Solu-Medrol, bronchodilators Chloraseptic spray, added Cepacol lozenges.  Completed  Tamiflu.  Acute metabolic encephalopathy:resolved.CT head with chronic stroke.  Continue delirium precautions and supportive care.    Deconditioning: Appears weak and frail, continue PT OT-is for discharge back to her facility.   Hypertension:BP poorly controlled/accelerated, at times fluctuating, hydralazine started and now at 100 mg 3 times daily can increase to 4 times daily if needed.  Continue current labetalol unable to increase due to bradycardia.She is intolerant to calcium channel blocker.  CAD/HLD History of stroke ?? A flutter on EKG: Has prolonged QTC monitor EKG, there is question of a flutter versus junctional rhythm repeating EKG.  Cont her home Aspirin.  She is high risk for anticoagulation and we will hold off on anticoagulation if even if she has A. fib, will need to be evaluated by her primary care doctor or primary cardiologist as outpatient.  Hypothyroidism-TSH suppressed 0.29, free T4 is high at 1.4, she is on Synthroid 75 MCG -decreased the dose to 50 mcg repeat TSH in 3 weeks   AKI on CKD YH:CWCBJSEG creatinine 0.8 04/17/20: AKI resolved, off IV fluids.  Encourage oral intake.   Recent Labs  Lab 07/20/21 0240 07/21/21 0658 07/22/21 0052 07/23/21 0425 07/24/21 0110  BUN 30* 40* 38* 35* 36*  CREATININE 1.44* 1.02* 0.96 0.89 0.98     Ambulatory dysfunction-has knee issues,does not walk  DVT prophylaxis: enoxaparin (LOVENOX) injection 30 mg Start: 07/19/21 2000 Code Status:   Code Status: DNR Family Communication: plan of care discussed with patient's son on phone previously.  I called son [name] today to update- went to voicemail.   Status is: Inpatient Remains hospitalized for ongoing needs for reactive airway disease with influenza A positive wheezing hypoxic   Disposition: Currently not medically stable for discharge. Anticipated Disposition: Back to her long-term  facility, wants no more coughing spells.  Per social worker patient is unable to return over the  weekend  Objective: Vitals last 24 hrs: Vitals:   07/24/21 0539 07/24/21 0829 07/24/21 0830 07/24/21 0927  BP: (!) 188/64   131/67  Pulse: 95   71  Resp: 20   16  Temp: 97.9 F (36.6 C)   98.1 F (36.7 C)  TempSrc:      SpO2: 95% 98% 98% 96%   Weight change:   Intake/Output Summary (Last 24 hours) at 07/24/2021 1306 Last data filed at 07/24/2021 0928 Gross per 24 hour  Intake 360 ml  Output 1000 ml  Net -640 ml    Net IO Since Admission: -742.46 mL [07/24/21 1306]   Physical Examination: General exam: AAO, at baseline, very hard of hearing, in mild distress HEENT:Oral mucosa moist, Ear/Nose WNL grossly, dentition normal. Respiratory system: bilaterally diminished air entry with expiratory wheezing, no use of accessory muscle Cardiovascular system: S1 & S2 +, No JVD,. Gastrointestinal system: Abdomen soft,NT,ND, BS+ Nervous System:Alert, awake, moving extremities and grossly nonfocal Extremities: no edema, distal peripheral pulses palpable.  Skin: No rashes,no icterus. MSK: Normal muscle bulk,tone, power   Medications reviewed:  Scheduled Meds:  allopurinol  100 mg Oral Daily   aspirin EC  81 mg Oral Daily   benzonatate  200 mg Oral TID   budesonide (PULMICORT) nebulizer solution  0.25 mg Nebulization BID   diclofenac Sodium  2 g Topical TID   enoxaparin (LOVENOX) injection  30 mg Subcutaneous Q24H   guaiFENesin  600 mg Oral BID   hydrALAZINE  100 mg Oral Q8H   hydrochlorothiazide  25 mg Oral Daily   ipratropium-albuterol  3 mL Nebulization TID   labetalol  50 mg Oral BID   levothyroxine  50 mcg Oral QAC breakfast   methylPREDNISolone (SOLU-MEDROL) injection  40 mg Intravenous Q12H   omega-3 acid ethyl esters  1 g Oral QODAY   pantoprazole  20 mg Oral Daily   pregabalin  50 mg Oral Daily   sertraline  25 mg Oral Daily   Continuous Infusions:  Diet Order             DIET SOFT Room service appropriate? Yes; Fluid consistency: Thin  Diet effective now                           Weight change:   Wt Readings from Last 3 Encounters:  04/17/20 69.9 kg  04/08/20 64.1 kg  10/24/19 67.9 kg     Consultants:see note  Procedures:see note Antimicrobials: Anti-infectives (From admission, onward)    Start     Dose/Rate Route Frequency Ordered Stop   07/19/21 2200  nitrofurantoin (MACRODANTIN) capsule 100 mg  Status:  Discontinued        100 mg Oral Every 12 hours 07/19/21 1743 07/19/21 1817   07/19/21 1815  oseltamivir (TAMIFLU) capsule 30 mg        30 mg Oral Daily 07/19/21 1811 07/23/21 0859      Culture/Microbiology    Component Value Date/Time   SDES URINE, RANDOM 04/08/2020 1830   SPECREQUEST NONE 04/08/2020 1830   CULT (A) 04/08/2020 1830    <10,000 COLONIES/mL INSIGNIFICANT GROWTH Performed at [facility] [address] [place], [place] [address]    REPTSTATUS 04/10/2020 FINAL 04/08/2020 1830    Other culture-see note  Unresulted Labs (From admission, onward)     Start  Ordered   07/26/21 0500  Creatinine, serum  (enoxaparin (LOVENOX)    CrCl >/= 30 ml/min)  Weekly,   R     Comments: while on enoxaparin therapy    07/19/21 1727   07/19/21 1451  Urinalysis, Routine w reflex microscopic  Once,   STAT        07/19/21 1450          Data Reviewed: I have personally reviewed following labs and imaging studies CBC: Recent Labs  Lab 07/19/21 1451 07/20/21 0240 07/22/21 0052 07/23/21 0425 07/24/21 0110  WBC 6.6 6.3 9.6 10.4 10.2  NEUTROABS 4.2  --   --   --   --   HGB 15.0 13.7 13.7 14.9 14.7  HCT 44.6 41.8 41.8 43.6 42.6  MCV 93.7 93.1 93.1 90.8 91.0  PLT 224 203 218 227 009    Basic Metabolic Panel: Recent Labs  Lab 07/20/21 0240 07/21/21 0658 07/22/21 0052 07/23/21 0425 07/24/21 0110  NA 131* 134* 136 130* 132*  K 3.9 3.9 4.1 4.4 4.2  CL 96* 99 99 99 97*  CO2 25 25 26 22 25   GLUCOSE 219* 134* 143* 126* 163*  BUN 30* 40* 38* 35* 36*  CREATININE 1.44* 1.02* 0.96 0.89 0.98  CALCIUM 9.2  9.6 9.5 9.8 9.9    GFR: CrCl cannot be calculated (Unknown ideal weight.). Liver Function Tests: Recent Labs  Lab 07/19/21 1451 07/20/21 0240  AST 41 43*  ALT 15 17  ALKPHOS 73 63  BILITOT 0.8 0.6  PROT 6.3* 5.8*  ALBUMIN 3.5 3.0*    No results for input(s): LIPASE, AMYLASE in the last 168 hours. No results for input(s): AMMONIA in the last 168 hours. Coagulation Profile: No results for input(s): INR, PROTIME in the last 168 hours. Cardiac Enzymes: No results for input(s): CKTOTAL, CKMB, CKMBINDEX, TROPONINI in the last 168 hours. BNP (last 3 results) No results for input(s): PROBNP in the last 8760 hours. HbA1C: No results for input(s): HGBA1C in the last 72 hours. CBG: Recent Labs  Lab 07/22/21 0818  GLUCAP 115*    Lipid Profile: No results for input(s): CHOL, HDL, LDLCALC, TRIG, CHOLHDL, LDLDIRECT in the last 72 hours. Thyroid Function Tests: No results for input(s): TSH, T4TOTAL, FREET4, T3FREE, THYROIDAB in the last 72 hours.  Anemia Panel: No results for input(s): VITAMINB12, FOLATE, FERRITIN, TIBC, IRON, RETICCTPCT in the last 72 hours. Sepsis Labs: No results for input(s): PROCALCITON, LATICACIDVEN in the last 168 hours.  Recent Results (from the past 240 hour(s))  Resp Panel by RT-PCR (Flu A&B, Covid) Nasopharyngeal Swab     Status: Abnormal   Collection Time: 07/19/21  2:52 PM   Specimen: Nasopharyngeal Swab; Nasopharyngeal(NP) swabs in vial transport medium  Result Value Ref Range Status   SARS Coronavirus 2 by RT PCR NEGATIVE NEGATIVE Final    Comment: (NOTE) SARS-CoV-2 target nucleic acids are NOT DETECTED.  The SARS-CoV-2 RNA is generally detectable in upper respiratory specimens during the acute phase of infection. The lowest concentration of SARS-CoV-2 viral copies this assay can detect is 138 copies/mL. A negative result does not preclude SARS-Cov-2 infection and should not be used as the sole basis for treatment or other patient management  decisions. A negative result may occur with  improper specimen collection/handling, submission of specimen other than nasopharyngeal swab, presence of viral mutation(s) within the areas targeted by this assay, and inadequate number of viral copies(<138 copies/mL). A negative result must be combined with clinical observations, patient history, and epidemiological  information. The expected result is Negative.  Fact Sheet for Patients:  EntrepreneurPulse.com.au  Fact Sheet for Healthcare Providers:  IncredibleEmployment.be  This test is no t yet approved or cleared by the [place] FDA and  has been authorized for detection and/or diagnosis of SARS-CoV-2 by FDA under an Emergency Use Authorization (EUA). This EUA will remain  in effect (meaning this test can be used) for the duration of the COVID-19 declaration under Section 564(b)(1) of the Act, 21 U.S.C.section 360bbb-3(b)(1), unless the authorization is terminated  or revoked sooner.       Influenza A by PCR POSITIVE (A) NEGATIVE Final   Influenza B by PCR NEGATIVE NEGATIVE Final    Comment: (NOTE) The Xpert Xpress SARS-CoV-2/FLU/RSV plus assay is intended as an aid in the diagnosis of influenza from Nasopharyngeal swab specimens and should not be used as a sole basis for treatment. Nasal washings and aspirates are unacceptable for Xpert Xpress SARS-CoV-2/FLU/RSV testing.  Fact Sheet for Patients: EntrepreneurPulse.com.au  Fact Sheet for Healthcare Providers: IncredibleEmployment.be  This test is not yet approved or cleared by the [place] FDA and has been authorized for detection and/or diagnosis of SARS-CoV-2 by FDA under an Emergency Use Authorization (EUA). This EUA will remain in effect (meaning this test can be used) for the duration of the COVID-19 declaration under Section 564(b)(1) of the Act, 21 U.S.C. section 360bbb-3(b)(1), unless  the authorization is terminated or revoked.  Performed at [facility], [place] [address], [place], [place] [address]   MRSA Next Gen by PCR, Nasal     Status: None   Collection Time: 07/20/21  8:46 PM   Specimen: Nasal Mucosa; Nasal Swab  Result Value Ref Range Status   MRSA by PCR Next Gen NOT DETECTED NOT DETECTED Final    Comment: (NOTE) The GeneXpert MRSA Assay (FDA approved for NASAL specimens only), is one component of a comprehensive MRSA colonization surveillance program. It is not intended to diagnose MRSA infection nor to guide or monitor treatment for MRSA infections. Test performance is not FDA approved in patients less than 65 years old. Performed at [facility], [place] [address] [place], [place] [address]   SARS CORONAVIRUS 2 (TAT 6-24 HRS) Nasopharyngeal Nasopharyngeal Swab     Status: None   Collection Time: 07/23/21 11:44 AM   Specimen: Nasopharyngeal Swab  Result Value Ref Range Status   SARS Coronavirus 2 NEGATIVE NEGATIVE Final    Comment: (NOTE) SARS-CoV-2 target nucleic acids are NOT DETECTED.  The SARS-CoV-2 RNA is generally detectable in upper and lower respiratory specimens during the acute phase of infection. Negative results do not preclude SARS-CoV-2 infection, do not rule out co-infections with other pathogens, and should not be used as the sole basis for treatment or other patient management decisions. Negative results must be combined with clinical observations, patient history, and epidemiological information. The expected result is Negative.  Fact Sheet for Patients: SugarRoll.be  Fact Sheet for Healthcare Providers: https://www.woods-mathews.com/  This test is not yet approved or cleared by the [place] FDA and  has been authorized for detection and/or diagnosis of SARS-CoV-2 by FDA under an Emergency Use Authorization (EUA). This EUA will remain  in effect (meaning this test can be  used) for the duration of the COVID-19 declaration under Se ction 564(b)(1) of the Act, 21 U.S.C. section 360bbb-3(b)(1), unless the authorization is terminated or revoked sooner.  Performed at [facility], [place] [address], [place], [place] [address]  Radiology Studies: DG Chest 1 View  Result Date: 07/24/2021 CLINICAL DATA:  Shortness of breath. EXAM: CHEST  1 VIEW COMPARISON:  July 19, 2021. FINDINGS: Stable cardiomediastinal silhouette. Right lung is clear. Stable minimal left basilar subsegmental atelectasis is noted. Bony thorax is unremarkable. IMPRESSION: Stable minimal left basilar subsegmental atelectasis. Electronically Signed   By: Marijo Conception M.D.   On: 07/24/2021 10:47     LOS: 4 days   Antonieta Pert, MD [facility]  07/24/2021, 1:06 PM

## 2021-07-25 DIAGNOSIS — J111 Influenza due to unidentified influenza virus with other respiratory manifestations: Secondary | ICD-10-CM | POA: Diagnosis not present

## 2021-07-25 LAB — CBC
HCT: 42.9 % (ref 36.0–46.0)
Hemoglobin: 14.9 g/dL (ref 12.0–15.0)
MCH: 31.5 pg (ref 26.0–34.0)
MCHC: 34.7 g/dL (ref 30.0–36.0)
MCV: 90.7 fL (ref 80.0–100.0)
Platelets: 236 10*3/uL (ref 150–400)
RBC: 4.73 MIL/uL (ref 3.87–5.11)
RDW: 15.4 % (ref 11.5–15.5)
WBC: 12.6 10*3/uL — ABNORMAL HIGH (ref 4.0–10.5)
nRBC: 0 % (ref 0.0–0.2)

## 2021-07-25 LAB — COMPREHENSIVE METABOLIC PANEL
ALT: 35 U/L (ref 0–44)
AST: 68 U/L — ABNORMAL HIGH (ref 15–41)
Albumin: 3.3 g/dL — ABNORMAL LOW (ref 3.5–5.0)
Alkaline Phosphatase: 47 U/L (ref 38–126)
Anion gap: 10 (ref 5–15)
BUN: 43 mg/dL — ABNORMAL HIGH (ref 8–23)
CO2: 23 mmol/L (ref 22–32)
Calcium: 10.1 mg/dL (ref 8.9–10.3)
Chloride: 101 mmol/L (ref 98–111)
Creatinine, Ser: 1.07 mg/dL — ABNORMAL HIGH (ref 0.44–1.00)
GFR, Estimated: 49 mL/min — ABNORMAL LOW (ref >60)
Glucose, Bld: 108 mg/dL — ABNORMAL HIGH (ref 70–99)
Potassium: 4 mmol/L (ref 3.5–5.1)
Sodium: 134 mmol/L — ABNORMAL LOW (ref 135–145)
Total Bilirubin: 1 mg/dL (ref 0.3–1.2)
Total Protein: 5.6 g/dL — ABNORMAL LOW (ref 6.5–8.1)

## 2021-07-25 MED ORDER — METHYLPREDNISOLONE SODIUM SUCC 40 MG IJ SOLR
40.0000 mg | INTRAMUSCULAR | Status: DC
  Administered 2021-07-26: 40 mg via INTRAVENOUS
  Filled 2021-07-25: qty 1

## 2021-07-25 MED ORDER — ALPRAZOLAM 0.25 MG PO TABS
0.2500 mg | ORAL_TABLET | Freq: Three times a day (TID) | ORAL | Status: DC | PRN
  Administered 2021-07-25: 0.25 mg via ORAL
  Filled 2021-07-25: qty 1

## 2021-07-25 MED ORDER — HALOPERIDOL LACTATE 2 MG/ML PO CONC
1.0000 mg | Freq: Three times a day (TID) | ORAL | Status: DC | PRN
  Filled 2021-07-25: qty 0.5

## 2021-07-25 MED ORDER — HALOPERIDOL LACTATE 2 MG/ML PO CONC
1.0000 mg | Freq: Two times a day (BID) | ORAL | Status: DC | PRN
  Filled 2021-07-25: qty 0.5

## 2021-07-25 MED ORDER — LORAZEPAM 0.5 MG PO TABS: 0.5000 mg | ORAL_TABLET | Freq: Three times a day (TID) | ORAL | Status: DC | PRN

## 2021-07-25 MED ORDER — LORAZEPAM 0.5 MG PO TABS: 0.5000 mg | ORAL_TABLET | Freq: Four times a day (QID) | ORAL | Status: DC | PRN

## 2021-07-25 NOTE — Plan of Care (Signed)
  Problem: Education: Goal: Knowledge of General Education information will improve Description: Including pain rating scale, medication(s)/side effects and non-pharmacologic comfort measures Outcome: Not Progressing   Problem: Health Behavior/Discharge Planning: Goal: Ability to manage health-related needs will improve Outcome: Not Progressing   Problem: Clinical Measurements: Goal: Ability to maintain clinical measurements within normal limits will improve Outcome: Progressing Goal: Will remain free from infection Outcome: Progressing Goal: Diagnostic test results will improve Outcome: Progressing Goal: Respiratory complications will improve Outcome: Progressing Goal: Cardiovascular complication will be avoided Outcome: Progressing   Problem: Clinical Measurements: Goal: Respiratory complications will improve Outcome: Progressing   Problem: Clinical Measurements: Goal: Cardiovascular complication will be avoided Outcome: Progressing   Problem: Activity: Goal: Risk for activity intolerance will decrease Outcome: Not Progressing   Problem: Elimination: Goal: Will not experience complications related to bowel motility Outcome: Not Progressing Goal: Will not experience complications related to urinary retention Outcome: Progressing   Problem: Safety: Goal: Ability to remain free from injury will improve Outcome: Progressing   Problem: Skin Integrity: Goal: Risk for impaired skin integrity will decrease Outcome: Progressing

## 2021-07-25 NOTE — Progress Notes (Addendum)
PROGRESS NOTE    [name]  WNI:[id number] DOB: 12/17/1928 DOA: 07/19/2021 PCP: Garwin Brothers, MD    Brief Narrative:  [name], 87 y.o. female with PMH of HTN/HLD/history of stroke, CAD, CKD stage II, sent from [facility] for evaluation of altered mental status. Per EMS and baseline AAO x4 but was confused today, on arrival for EMS patient was alert to person place date and events, was having constant cough dry hacking and also hypoxic 85 percent on room air.  Apparently she is in constant nebulizer at the facility.   In the ED afebrile, again was hypoxic 85% placed on 3 L nasal cannula with improvement, labs revealed stable CBC but creatinine elevated 1.6, influenza came back positive, chest x-ray no acute finding CT head with old left MCA stroke.  Patient was tachypneic and wheezing placed on nebulizer bronchodilators Tamiflu and admission requested Patient was admitted management IV fluid hydration steroids bronchodilators Tamiflu  07/25/2021 During my physical exam patient was in no respiratory distress  Some coughing and mild anxiety Per nurse Patient has been very anxious and crying out and having some dry coughing since day shift.  Her vitals are WNL with exception to her BP being high.  Tried to medicate with cough syrup and Tylenol. Patient is spitting out medications.  She did take her Xanax earlier in the shift. Xanax was not effective.   She is alert and oriented x 3 but she seems uncomfortable due to the dry coughing and her very high anxiety.  She is not wanting to take the medications that could possibly help her. I will change Xanax with Ativan and Haldol as needed She takes Zoloft as well we need to hold if any reaction Blood pressure mildly elevated is receiving hydralazine IV as needed   Assessment & Plan:  Acute respiratory failure secondary to influenza Reactive airway disease No history of COPD Patient receiving Pulmicort around-the-clock  Solu-Medrol, DuoNeb around-the-clock Cough medication, Tamiflu  Acute metabolic encephalopathy resolved CT of the head with chronic CVA Continue delirium precaution and supportive care On telemetry Xanax does not help or we will change to Ativan and Haldol as needed Patient taking Zoloft as well we will consider to hold  Essential hypertension Resume home medication hydralazine IV as needed  Coronary artery disease History of CVA ?? A flutter on EKG: Has prolonged QTC monitor EKG, there is question of a flutter versus junctional rhythm repeating EKG.  Cont her home Aspirin.  She is high risk for anticoagulation and we will hold off on anticoagulation if even if she has A. fib, will need to be evaluated by her primary care doctor or primary cardiologist as outpatient  Hypothyroidism Synthroid was changed to 26 MCG from 75 check TSH in 3 weeks  Acute kidney injury on chronic kidney disease Resolved with IV fluids  Ambulatory dysfunction Has knee issues does not walk   Principal Problem:   Influenza Active Problems:   Hypertension   CAD (coronary artery disease)   ARF (acute renal failure) (HCC)   Hyperlipidemia   History of stroke   Acute respiratory failure (HCC)      DVT prophylaxis: (Lovenox/ Code Status: DNR Family Communication: (No family in the room Disposition Plan:  Disposition: Currently not medically stable for discharge. Anticipated Disposition: Back to her long-term facility tomorrow if remains stable.send covid test today COVID test was negative Consultants:    Procedures:    Antimicrobials: (   Subjective: Crying refusing to take  her p.o. medication Anxious Cough  Objective: Vitals:   07/25/21 0100 07/25/21 0619 07/25/21 0621 07/25/21 0731  BP: (!) 157/100 (!) 185/74 (!) 185/74   Pulse: (!) 106  87   Resp: 16  16   Temp: 99.9 F (37.7 C)  98.9 F (37.2 C)   TempSrc: Axillary  Oral   SpO2:   97% 96%    Intake/Output Summary (Last  24 hours) at 07/25/2021 1248 Last data filed at 07/24/2021 1544 Gross per 24 hour  Intake --  Output 300 ml  Net -300 ml   There were no vitals filed for this visit.  Examination:  General exam: Appears anxious Respiratory system: Some wheezing. Respiratory effort normal. Cardiovascular system: S1 & S2 heard, RRR. No JVD, murmurs, rubs, gallops or clicks. No pedal edema. Gastrointestinal system: Abdomen is nondistended, soft and nontender. No organomegaly or masses felt. Normal bowel sounds heard. Central nervous system: Alert and oriented. No focal neurological deficits. Extremities: Symmetric 5 x 5 power. Skin: No rashes, lesions or ulcers Psychiatry: Unable to perform    Data Reviewed: I have personally reviewed following labs and imaging studies  CBC: Recent Labs  Lab 07/19/21 1451 07/20/21 0240 07/22/21 0052 07/23/21 0425 07/24/21 0110  WBC 6.6 6.3 9.6 10.4 10.2  NEUTROABS 4.2  --   --   --   --   HGB 15.0 13.7 13.7 14.9 14.7  HCT 44.6 41.8 41.8 43.6 42.6  MCV 93.7 93.1 93.1 90.8 91.0  PLT 224 203 218 227 329   Basic Metabolic Panel: Recent Labs  Lab 07/20/21 0240 07/21/21 0658 07/22/21 0052 07/23/21 0425 07/24/21 0110  NA 131* 134* 136 130* 132*  K 3.9 3.9 4.1 4.4 4.2  CL 96* 99 99 99 97*  CO2 25 25 26 22 25   GLUCOSE 219* 134* 143* 126* 163*  BUN 30* 40* 38* 35* 36*  CREATININE 1.44* 1.02* 0.96 0.89 0.98  CALCIUM 9.2 9.6 9.5 9.8 9.9   GFR: CrCl cannot be calculated (Unknown ideal weight.). Liver Function Tests: Recent Labs  Lab 07/19/21 1451 07/20/21 0240  AST 41 43*  ALT 15 17  ALKPHOS 73 63  BILITOT 0.8 0.6  PROT 6.3* 5.8*  ALBUMIN 3.5 3.0*   No results for input(s): LIPASE, AMYLASE in the last 168 hours. No results for input(s): AMMONIA in the last 168 hours. Coagulation Profile: No results for input(s): INR, PROTIME in the last 168 hours. Cardiac Enzymes: No results for input(s): CKTOTAL, CKMB, CKMBINDEX, TROPONINI in the last 168  hours. BNP (last 3 results) No results for input(s): PROBNP in the last 8760 hours. HbA1C: No results for input(s): HGBA1C in the last 72 hours. CBG: Recent Labs  Lab 07/22/21 0818  GLUCAP 115*   Lipid Profile: No results for input(s): CHOL, HDL, LDLCALC, TRIG, CHOLHDL, LDLDIRECT in the last 72 hours. Thyroid Function Tests: No results for input(s): TSH, T4TOTAL, FREET4, T3FREE, THYROIDAB in the last 72 hours. Anemia Panel: No results for input(s): VITAMINB12, FOLATE, FERRITIN, TIBC, IRON, RETICCTPCT in the last 72 hours. Sepsis Labs: No results for input(s): PROCALCITON, LATICACIDVEN in the last 168 hours.  Recent Results (from the past 240 hour(s))  Resp Panel by RT-PCR (Flu A&B, Covid) Nasopharyngeal Swab     Status: Abnormal   Collection Time: 07/19/21  2:52 PM   Specimen: Nasopharyngeal Swab; Nasopharyngeal(NP) swabs in vial transport medium  Result Value Ref Range Status   SARS Coronavirus 2 by RT PCR NEGATIVE NEGATIVE Final    Comment: (NOTE) SARS-CoV-2  target nucleic acids are NOT DETECTED.  The SARS-CoV-2 RNA is generally detectable in upper respiratory specimens during the acute phase of infection. The lowest concentration of SARS-CoV-2 viral copies this assay can detect is 138 copies/mL. A negative result does not preclude SARS-Cov-2 infection and should not be used as the sole basis for treatment or other patient management decisions. A negative result may occur with  improper specimen collection/handling, submission of specimen other than nasopharyngeal swab, presence of viral mutation(s) within the areas targeted by this assay, and inadequate number of viral copies(<138 copies/mL). A negative result must be combined with clinical observations, patient history, and epidemiological information. The expected result is Negative.  Fact Sheet for Patients:  EntrepreneurPulse.com.au  Fact Sheet for Healthcare Providers:   IncredibleEmployment.be  This test is no t yet approved or cleared by the [place] FDA and  has been authorized for detection and/or diagnosis of SARS-CoV-2 by FDA under an Emergency Use Authorization (EUA). This EUA will remain  in effect (meaning this test can be used) for the duration of the COVID-19 declaration under Section 564(b)(1) of the Act, 21 U.S.C.section 360bbb-3(b)(1), unless the authorization is terminated  or revoked sooner.       Influenza A by PCR POSITIVE (A) NEGATIVE Final   Influenza B by PCR NEGATIVE NEGATIVE Final    Comment: (NOTE) The Xpert Xpress SARS-CoV-2/FLU/RSV plus assay is intended as an aid in the diagnosis of influenza from Nasopharyngeal swab specimens and should not be used as a sole basis for treatment. Nasal washings and aspirates are unacceptable for Xpert Xpress SARS-CoV-2/FLU/RSV testing.  Fact Sheet for Patients: EntrepreneurPulse.com.au  Fact Sheet for Healthcare Providers: IncredibleEmployment.be  This test is not yet approved or cleared by the [place] FDA and has been authorized for detection and/or diagnosis of SARS-CoV-2 by FDA under an Emergency Use Authorization (EUA). This EUA will remain in effect (meaning this test can be used) for the duration of the COVID-19 declaration under Section 564(b)(1) of the Act, 21 U.S.C. section 360bbb-3(b)(1), unless the authorization is terminated or revoked.  Performed at [facility], [place] [address] [place], [place] [address]   MRSA Next Gen by PCR, Nasal     Status: None   Collection Time: 07/20/21  8:46 PM   Specimen: Nasal Mucosa; Nasal Swab  Result Value Ref Range Status   MRSA by PCR Next Gen NOT DETECTED NOT DETECTED Final    Comment: (NOTE) The GeneXpert MRSA Assay (FDA approved for NASAL specimens only), is one component of a comprehensive MRSA colonization surveillance program. It is not intended to  diagnose MRSA infection nor to guide or monitor treatment for MRSA infections. Test performance is not FDA approved in patients less than 69 years old. Performed at [facility], [place] [address] [place], [place] [address]   SARS CORONAVIRUS 2 (TAT 6-24 HRS) Nasopharyngeal Nasopharyngeal Swab     Status: None   Collection Time: 07/23/21 11:44 AM   Specimen: Nasopharyngeal Swab  Result Value Ref Range Status   SARS Coronavirus 2 NEGATIVE NEGATIVE Final    Comment: (NOTE) SARS-CoV-2 target nucleic acids are NOT DETECTED.  The SARS-CoV-2 RNA is generally detectable in upper and lower respiratory specimens during the acute phase of infection. Negative results do not preclude SARS-CoV-2 infection, do not rule out co-infections with other pathogens, and should not be used as the sole basis for treatment or other patient management decisions. Negative results must be combined with clinical observations, patient history, and  epidemiological information. The expected result is Negative.  Fact Sheet for Patients: SugarRoll.be  Fact Sheet for Healthcare Providers: https://www.woods-mathews.com/  This test is not yet approved or cleared by the [place] FDA and  has been authorized for detection and/or diagnosis of SARS-CoV-2 by FDA under an Emergency Use Authorization (EUA). This EUA will remain  in effect (meaning this test can be used) for the duration of the COVID-19 declaration under Se ction 564(b)(1) of the Act, 21 U.S.C. section 360bbb-3(b)(1), unless the authorization is terminated or revoked sooner.  Performed at [facility], [place] [address], [place], [place] [address]          Radiology Studies: DG Chest 1 View  Result Date: 07/24/2021 CLINICAL DATA:  Shortness of breath. EXAM: CHEST  1 VIEW COMPARISON:  July 19, 2021. FINDINGS: Stable cardiomediastinal silhouette. Right lung is clear. Stable minimal left  basilar subsegmental atelectasis is noted. Bony thorax is unremarkable. IMPRESSION: Stable minimal left basilar subsegmental atelectasis. Electronically Signed   By: Marijo Conception M.D.   On: 07/24/2021 10:47        Scheduled Meds:  allopurinol  100 mg Oral Daily   aspirin EC  81 mg Oral Daily   benzonatate  200 mg Oral TID   budesonide (PULMICORT) nebulizer solution  0.25 mg Nebulization BID   diclofenac Sodium  2 g Topical TID   enoxaparin (LOVENOX) injection  30 mg Subcutaneous Q24H   guaiFENesin  600 mg Oral BID   hydrALAZINE  100 mg Oral Q8H   hydrochlorothiazide  25 mg Oral Daily   ipratropium-albuterol  3 mL Nebulization TID   labetalol  50 mg Oral BID   levothyroxine  50 mcg Oral QAC breakfast   [START ON 07/26/2021] methylPREDNISolone (SOLU-MEDROL) injection  40 mg Intravenous Q24H   omega-3 acid ethyl esters  1 g Oral QODAY   pantoprazole  20 mg Oral Daily   pregabalin  50 mg Oral Daily   sertraline  25 mg Oral Daily   Continuous Infusions:   LOS: 5 days    Time spent: 35 minutes    Olyn Landstrom G Shaniqua Guillot, MD [facility] Pager  If 7PM-7AM, please contact night-coverage [email or web address] Password [facility] 07/25/2021, 12:48 PM

## 2021-07-25 NOTE — Progress Notes (Signed)
Patient has been very anxious and crying out and having some dry coughing since day shift.  Her vitals are WNL with exception to her BP being high.  Tried to medicate with cough syrup and Tylenol. Patient is spitting out medications.  She did take her Xanax earlier in the shift. Xanax was not effective.  She is alert and oriented x 3 but she seems uncomfortable due to the dry coughing and her very high anxiety.  She is not wanting to take the medications that could possibly help her.    Staff have tried repositioning her, bathing her, offering medications, offering drinks and snacks. She seems to want staff to remain in the room with her.  0430 Patient finally started to settle down and got a little bit of sleep.  [phone] Patient is refusing to take her morning synthroid and BP medication.

## 2021-07-26 DIAGNOSIS — J111 Influenza due to unidentified influenza virus with other respiratory manifestations: Secondary | ICD-10-CM | POA: Diagnosis not present

## 2021-07-26 LAB — COMPREHENSIVE METABOLIC PANEL
ALT: 40 U/L (ref 0–44)
AST: 84 U/L — ABNORMAL HIGH (ref 15–41)
Albumin: 3.2 g/dL — ABNORMAL LOW (ref 3.5–5.0)
Alkaline Phosphatase: 47 U/L (ref 38–126)
Anion gap: 9 (ref 5–15)
BUN: 39 mg/dL — ABNORMAL HIGH (ref 8–23)
CO2: 24 mmol/L (ref 22–32)
Calcium: 9.7 mg/dL (ref 8.9–10.3)
Chloride: 100 mmol/L (ref 98–111)
Creatinine, Ser: 1.02 mg/dL — ABNORMAL HIGH (ref 0.44–1.00)
GFR, Estimated: 52 mL/min — ABNORMAL LOW (ref >60)
Glucose, Bld: 96 mg/dL (ref 70–99)
Potassium: 3.9 mmol/L (ref 3.5–5.1)
Sodium: 133 mmol/L — ABNORMAL LOW (ref 135–145)
Total Bilirubin: 1.3 mg/dL — ABNORMAL HIGH (ref 0.3–1.2)
Total Protein: 5.3 g/dL — ABNORMAL LOW (ref 6.5–8.1)

## 2021-07-26 LAB — CBC
HCT: 42.4 % (ref 36.0–46.0)
Hemoglobin: 14.6 g/dL (ref 12.0–15.0)
MCH: 30.9 pg (ref 26.0–34.0)
MCHC: 34.4 g/dL (ref 30.0–36.0)
MCV: 89.6 fL (ref 80.0–100.0)
Platelets: 258 10*3/uL (ref 150–400)
RBC: 4.73 MIL/uL (ref 3.87–5.11)
RDW: 15.4 % (ref 11.5–15.5)
WBC: 11.7 10*3/uL — ABNORMAL HIGH (ref 4.0–10.5)
nRBC: 0 % (ref 0.0–0.2)

## 2021-07-26 NOTE — Progress Notes (Signed)
PROGRESS NOTE    [name]  SWF:[id number] DOB: [birth year] DOA: 07/19/2021 PCP: Garwin Brothers, MD    Brief Narrative: [name], 87 y.o. female with PMH of HTN/HLD/history of stroke, CAD, CKD stage II, sent from [facility] facility for evaluation of altered mental status. Per EMS and baseline AAO x4 but was confused today, on arrival for EMS patient was alert to person place date and events, was having constant cough dry hacking and also hypoxic 85 percent on room air.  Apparently she is in constant nebulizer at the facility.   In the ED afebrile, again was hypoxic 85% placed on 3 L nasal cannula with improvement, labs revealed stable CBC but creatinine elevated 1.6, influenza came back positive, chest x-ray no acute finding CT head with old left MCA stroke.  Patient was tachypneic and wheezing placed on nebulizer bronchodilators Tamiflu and admission requested Patient was admitted management IV fluid hydration steroids bronchodilators Tamiflu   07/25/2021 During my physical exam patient was in no respiratory distress  Some coughing and mild anxiety Per nurse Patient has been very anxious and crying out and having some dry coughing since day shift.  Her vitals are WNL with exception to her BP being high.  Tried to medicate with cough syrup and Tylenol. Patient is spitting out medications.  She did take her Xanax earlier in the shift. Xanax was not effective.   She is alert and oriented x 3 but she seems uncomfortable due to the dry coughing and her very high anxiety.  She is not wanting to take the medications that could possibly help her. I will change Xanax with Ativan and Haldol as needed She takes Zoloft as well we need to hold if any reaction Blood pressure mildly elevated is receiving hydralazine IV as needed  05/26/2021 No complaints No anxiety or crying, no fever chills shortness of breath nausea vomiting  Assessment & Plan: Acute respiratory failure secondary to  influenza Reactive airway disease No history of COPD Patient receiving Pulmicort around-the-clock Solu-Medrol, DuoNeb around-the-clock Cough medication, Tamiflu   Acute metabolic encephalopathy resolved CT of the head with chronic CVA Continue delirium precaution and supportive care On telemetry Xanax does not help or we will change to Ativan and Haldol as needed Patient taking Zoloft as well we will consider to hold   Essential hypertension Resume home medication hydralazine IV as needed   Coronary artery disease History of CVA ?? A flutter on EKG: Has prolonged QTC monitor EKG, there is question of a flutter versus junctional rhythm repeating EKG.  Cont her home Aspirin.  She is high risk for anticoagulation and we will hold off on anticoagulation if even if she has A. fib, will need to be evaluated by her primary care doctor or primary cardiologist as outpatient   Hypothyroidism Synthroid was changed to 59 MCG from 75 check TSH in 3 weeks   Acute kidney injury on chronic kidney disease Resolved with IV fluids   Ambulatory dysfunction Has knee issues does not walk     Principal Problem:   Influenza Active Problems:   Hypertension   CAD (coronary artery disease)   ARF (acute renal failure) (HCC)   Hyperlipidemia   History of stroke   Acute respiratory failure (HCC)     DVT prophylaxis: (Lovenox/ Code Status: DNR Family Communication: (No family in the room Disposition Plan:  Disposition: Currently not medically stable for discharge. Anticipated Disposition: Back to her long-term facility tomorrow if remains stable.send covid test  today COVID test was negative      Subjective: Stable-hemodynamically no complaints  Objective: Vitals:   07/25/21 2102 07/26/21 0606 07/26/21 0845 07/26/21 0935  BP:  (!) 167/69 (!) 173/61   Pulse:   (!) 55   Resp:   19   Temp:   97.9 F (36.6 C)   TempSrc:   Oral   SpO2: 97%  96% 96%   No intake or output data in the 24  hours ending 07/26/21 1256 There were no vitals filed for this visit.  Examination:  General exam: Appears calm and comfortable  Respiratory system: Clear to auscultation. Respiratory effort normal. Cardiovascular system: S1 & S2 heard, RRR. No JVD, murmurs, rubs, gallops or clicks. No pedal edema. Gastrointestinal system: Abdomen is nondistended, soft and nontender. No organomegaly or masses felt. Normal bowel sounds heard. Central nervous system: Alert and oriented. No focal neurological deficits. Extremities: Symmetric 5 x 5 power. Skin: No rashes, lesions or ulcers Psychiatry: Judgement and insight appear normal. Mood & affect appropriate.     Data Reviewed: I have personally reviewed following labs and imaging studies  CBC: Recent Labs  Lab 07/19/21 1451 07/20/21 0240 07/22/21 0052 07/23/21 0425 07/24/21 0110 07/25/21 1513 07/26/21 0441  WBC 6.6   < > 9.6 10.4 10.2 12.6* 11.7*  NEUTROABS 4.2  --   --   --   --   --   --   HGB 15.0   < > 13.7 14.9 14.7 14.9 14.6  HCT 44.6   < > 41.8 43.6 42.6 42.9 42.4  MCV 93.7   < > 93.1 90.8 91.0 90.7 89.6  PLT 224   < > 218 227 217 236 258   < > = values in this interval not displayed.   Basic Metabolic Panel: Recent Labs  Lab 07/22/21 0052 07/23/21 0425 07/24/21 0110 07/25/21 1513 07/26/21 0441  NA 136 130* 132* 134* 133*  K 4.1 4.4 4.2 4.0 3.9  CL 99 99 97* 101 100  CO2 26 22 25 23 24   GLUCOSE 143* 126* 163* 108* 96  BUN 38* 35* 36* 43* 39*  CREATININE 0.96 0.89 0.98 1.07* 1.02*  CALCIUM 9.5 9.8 9.9 10.1 9.7   GFR: CrCl cannot be calculated (Unknown ideal weight.). Liver Function Tests: Recent Labs  Lab 07/19/21 1451 07/20/21 0240 07/25/21 1513 07/26/21 0441  AST 41 43* 68* 84*  ALT 15 17 35 40  ALKPHOS 73 63 47 47  BILITOT 0.8 0.6 1.0 1.3*  PROT 6.3* 5.8* 5.6* 5.3*  ALBUMIN 3.5 3.0* 3.3* 3.2*   No results for input(s): LIPASE, AMYLASE in the last 168 hours. No results for input(s): AMMONIA in the last  168 hours. Coagulation Profile: No results for input(s): INR, PROTIME in the last 168 hours. Cardiac Enzymes: No results for input(s): CKTOTAL, CKMB, CKMBINDEX, TROPONINI in the last 168 hours. BNP (last 3 results) No results for input(s): PROBNP in the last 8760 hours. HbA1C: No results for input(s): HGBA1C in the last 72 hours. CBG: Recent Labs  Lab 07/22/21 0818  GLUCAP 115*   Lipid Profile: No results for input(s): CHOL, HDL, LDLCALC, TRIG, CHOLHDL, LDLDIRECT in the last 72 hours. Thyroid Function Tests: No results for input(s): TSH, T4TOTAL, FREET4, T3FREE, THYROIDAB in the last 72 hours. Anemia Panel: No results for input(s): VITAMINB12, FOLATE, FERRITIN, TIBC, IRON, RETICCTPCT in the last 72 hours. Sepsis Labs: No results for input(s): PROCALCITON, LATICACIDVEN in the last 168 hours.  Recent Results (from the past 240 hour(s))  Resp Panel by RT-PCR (Flu A&B, Covid) Nasopharyngeal Swab     Status: Abnormal   Collection Time: 07/19/21  2:52 PM   Specimen: Nasopharyngeal Swab; Nasopharyngeal(NP) swabs in vial transport medium  Result Value Ref Range Status   SARS Coronavirus 2 by RT PCR NEGATIVE NEGATIVE Final    Comment: (NOTE) SARS-CoV-2 target nucleic acids are NOT DETECTED.  The SARS-CoV-2 RNA is generally detectable in upper respiratory specimens during the acute phase of infection. The lowest concentration of SARS-CoV-2 viral copies this assay can detect is 138 copies/mL. A negative result does not preclude SARS-Cov-2 infection and should not be used as the sole basis for treatment or other patient management decisions. A negative result may occur with  improper specimen collection/handling, submission of specimen other than nasopharyngeal swab, presence of viral mutation(s) within the areas targeted by this assay, and inadequate number of viral copies(<138 copies/mL). A negative result must be combined with clinical observations, patient history, and  epidemiological information. The expected result is Negative.  Fact Sheet for Patients:  EntrepreneurPulse.com.au  Fact Sheet for Healthcare Providers:  IncredibleEmployment.be  This test is no t yet approved or cleared by the [place] FDA and  has been authorized for detection and/or diagnosis of SARS-CoV-2 by FDA under an Emergency Use Authorization (EUA). This EUA will remain  in effect (meaning this test can be used) for the duration of the COVID-19 declaration under Section 564(b)(1) of the Act, 21 U.S.C.section 360bbb-3(b)(1), unless the authorization is terminated  or revoked sooner.       Influenza A by PCR POSITIVE (A) NEGATIVE Final   Influenza B by PCR NEGATIVE NEGATIVE Final    Comment: (NOTE) The Xpert Xpress SARS-CoV-2/FLU/RSV plus assay is intended as an aid in the diagnosis of influenza from Nasopharyngeal swab specimens and should not be used as a sole basis for treatment. Nasal washings and aspirates are unacceptable for Xpert Xpress SARS-CoV-2/FLU/RSV testing.  Fact Sheet for Patients: EntrepreneurPulse.com.au  Fact Sheet for Healthcare Providers: IncredibleEmployment.be  This test is not yet approved or cleared by the [place] FDA and has been authorized for detection and/or diagnosis of SARS-CoV-2 by FDA under an Emergency Use Authorization (EUA). This EUA will remain in effect (meaning this test can be used) for the duration of the COVID-19 declaration under Section 564(b)(1) of the Act, 21 U.S.C. section 360bbb-3(b)(1), unless the authorization is terminated or revoked.  Performed at [facility], [place] [address], [place], [place] [address]   MRSA Next Gen by PCR, Nasal     Status: None   Collection Time: 07/20/21  8:46 PM   Specimen: Nasal Mucosa; Nasal Swab  Result Value Ref Range Status   MRSA by PCR Next Gen NOT DETECTED NOT DETECTED Final    Comment:  (NOTE) The GeneXpert MRSA Assay (FDA approved for NASAL specimens only), is one component of a comprehensive MRSA colonization surveillance program. It is not intended to diagnose MRSA infection nor to guide or monitor treatment for MRSA infections. Test performance is not FDA approved in patients less than 74 years old. Performed at [facility], [place] [address], [place], [place] [address]   SARS CORONAVIRUS 2 (TAT 6-24 HRS) Nasopharyngeal Nasopharyngeal Swab     Status: None   Collection Time: 07/23/21 11:44 AM   Specimen: Nasopharyngeal Swab  Result Value Ref Range Status   SARS Coronavirus 2 NEGATIVE NEGATIVE Final    Comment: (NOTE) SARS-CoV-2 target nucleic acids are NOT DETECTED.  The SARS-CoV-2 RNA  is generally detectable in upper and lower respiratory specimens during the acute phase of infection. Negative results do not preclude SARS-CoV-2 infection, do not rule out co-infections with other pathogens, and should not be used as the sole basis for treatment or other patient management decisions. Negative results must be combined with clinical observations, patient history, and epidemiological information. The expected result is Negative.  Fact Sheet for Patients: SugarRoll.be  Fact Sheet for Healthcare Providers: https://www.woods-mathews.com/  This test is not yet approved or cleared by the [place] FDA and  has been authorized for detection and/or diagnosis of SARS-CoV-2 by FDA under an Emergency Use Authorization (EUA). This EUA will remain  in effect (meaning this test can be used) for the duration of the COVID-19 declaration under Se ction 564(b)(1) of the Act, 21 U.S.C. section 360bbb-3(b)(1), unless the authorization is terminated or revoked sooner.  Performed at [facility], [place] [address], [place], [place] [address]          Radiology Studies: No results found.      Scheduled Meds:   allopurinol  100 mg Oral Daily   aspirin EC  81 mg Oral Daily   benzonatate  200 mg Oral TID   budesonide (PULMICORT) nebulizer solution  0.25 mg Nebulization BID   diclofenac Sodium  2 g Topical TID   enoxaparin (LOVENOX) injection  30 mg Subcutaneous Q24H   guaiFENesin  600 mg Oral BID   hydrALAZINE  100 mg Oral Q8H   hydrochlorothiazide  25 mg Oral Daily   ipratropium-albuterol  3 mL Nebulization TID   labetalol  50 mg Oral BID   levothyroxine  50 mcg Oral QAC breakfast   methylPREDNISolone (SOLU-MEDROL) injection  40 mg Intravenous Q24H   omega-3 acid ethyl esters  1 g Oral QODAY   pantoprazole  20 mg Oral Daily   pregabalin  50 mg Oral Daily   sertraline  25 mg Oral Daily   Continuous Infusions:   LOS: 6 days    Time spent: 35 min    Cheryllynn Sarff G Latondra Gebhart, MD [facility]   If 7PM-7AM, please contact night-coverage [email or web address] Password [facility] 07/26/2021, 12:56 PM

## 2021-07-27 DIAGNOSIS — J111 Influenza due to unidentified influenza virus with other respiratory manifestations: Secondary | ICD-10-CM | POA: Diagnosis not present

## 2021-07-27 LAB — GLUCOSE, CAPILLARY
Glucose-Capillary: 169 mg/dL — ABNORMAL HIGH (ref 70–99)
Glucose-Capillary: 156 mg/dL — ABNORMAL HIGH (ref 70–99)
Glucose-Capillary: 121 mg/dL — ABNORMAL HIGH (ref 70–99)

## 2021-07-27 MED ORDER — MELATONIN 3 MG PO TABS
3.0000 mg | ORAL_TABLET | Freq: Every day | ORAL | Status: DC
  Administered 2021-07-27: 3 mg via ORAL
  Filled 2021-07-27: qty 1

## 2021-07-27 NOTE — Progress Notes (Signed)
[facility] PROGRESS NOTE    Progress Note  [name]  INO:[id number] DOB: Feb 19, 1929 DOA: 07/19/2021 PCP: Garwin Brothers, MD     Brief Narrative:   [name] is an 87 y.o. female past medical history of essential hypertension, chronic kidney disease stage II sent from nursing home for altered mental status, according to staff she has been having a hacking cough was hypoxic at 85% placed on 3 L of oxygen influenza PCR came back positive started on Tamiflu.  Weaned to 2 L of oxygen.    Assessment/Plan:   Acute respiratory failure with hypoxia due to influenza pneumonia: Still requiring 2 L of oxygen to keep saturation greater 90%. Completed Tamiflu treatment.  Acute metabolic encephalopathy: CT scan of the head showed no acute findings. At risk of delirium and aspiration pneumonia. Haldol for delirium.  Discontinue steroids as can make encephalopathy worse.  Essential hypertension: Home antihypertensive med regimen has been resumed, continue IV hydralazine as needed. Continue hydralazine and hydrochlorothiazide.  History of coronary artery disease, history of CVA: Continue home aspirin.  Sinus bradycardia: Has remained in sinus sinus rhythm with a heart rate of 60-65 discontinue telemetry with no rate controlling medication.  Hypothyroidism: Continue Synthroid.  Acute kidney injury on chronic kidney disease stage IIIa: Resolved with IV fluid hydration.  Likely prerenal azotemia.  Ambulatory dysfunction: Nonambulatory   RN Pressure Injury Documentation: Pressure Injury 05/21/18 Stage II -  Partial thickness loss of dermis presenting as a shallow open ulcer with a red, pink wound bed without slough. (Active)  05/21/18 1052  Location: Buttocks  Location Orientation: Left  Staging: Stage II -  Partial thickness loss of dermis presenting as a shallow open ulcer with a red, pink wound bed without slough.  Wound Description (Comments):   Present on Admission:       Pressure Injury 05/21/18 Stage II -  Partial thickness loss of dermis presenting as a shallow open ulcer with a red, pink wound bed without slough. (Active)  05/21/18 1052  Location: Buttocks  Location Orientation:   Staging: Stage II -  Partial thickness loss of dermis presenting as a shallow open ulcer with a red, pink wound bed without slough.  Wound Description (Comments):   Present on Admission:     Estimated body mass index is 27.3 kg/m as calculated from the following:   Height as of 09/30/20: 5\' 3"  (1.6 m).   Weight as of 04/17/20: 69.9 kg.     DVT prophylaxis: lovenox Family Communication:none Status is: Inpatient  Remains inpatient appropriate because: Awaiting skilled nursing facility placement.      Code Status:     Code Status Orders  (From admission, onward)           Start     Ordered   07/19/21 1728  Do not attempt resuscitation (DNR)  Continuous       Question Answer Comment  In the event of cardiac or respiratory ARREST Do not call a "code blue"   In the event of cardiac or respiratory ARREST Do not perform Intubation, CPR, defibrillation or ACLS   In the event of cardiac or respiratory ARREST Use medication by any route, position, wound care, and other measures to relive pain and suffering. May use oxygen, suction and manual treatment of airway obstruction as needed for comfort.      07/19/21 1727           Code Status History     Date Active Date Inactive Code  Status Order ID Comments User Context   04/16/2020 2129 04/18/2020 0044 DNR [id number]  Etta Quill, DO ED   04/08/2020 0047 04/12/2020 0026 DNR [id number]  Chotiner, Yevonne Aline, MD ED   03/09/2019 2249 03/10/2019 2244 DNR [id number]  [name], MD Inpatient   05/18/2018 0508 05/25/2018 2155 DNR [id number]  Rise Patience, MD Inpatient   08/02/2013 1607 08/03/2013 1725 DNR [id number]  Donne Hazel, MD ED   08/02/2013 1600 08/02/2013 1607 Full Code [id number]  Donne Hazel, MD ED   06/17/2013 0203 06/21/2013 1951 Full Code [id number]  Donnie Mesa, MD Inpatient         IV Access:   Peripheral IV   Procedures and diagnostic studies:   No results found.   Medical Consultants:   None.   Subjective:    [name] no complaints this morning  Objective:    Vitals:   07/26/21 2028 07/27/21 0445 07/27/21 0801 07/27/21 0839  BP:  (!) 144/78  (!) 159/55  Pulse:  97 80 72  Resp:  20 20 16   Temp:  98.6 F (37 C)  97.8 F (36.6 C)  TempSrc:  Oral  Oral  SpO2: 96% 97% 95% 95%   SpO2: 95 % O2 Flow Rate (L/min): 2 L/min   Intake/Output Summary (Last 24 hours) at 07/27/2021 1043 Last data filed at 07/27/2021 6237 Gross per 24 hour  Intake --  Output 1100 ml  Net -1100 ml   There were no vitals filed for this visit.  Exam: General exam: In no acute distress. Respiratory system: Good air movement and clear to auscultation. Cardiovascular system: S1 & S2 heard, RRR.  Gastrointestinal system: Abdomen is nondistended, soft and nontender.  Extremities: No pedal edema. Skin: No rashes, lesions or ulcers  Data Reviewed:    Labs: Basic Metabolic Panel: Recent Labs  Lab 07/22/21 0052 07/23/21 0425 07/24/21 0110 07/25/21 1513 07/26/21 0441  NA 136 130* 132* 134* 133*  K 4.1 4.4 4.2 4.0 3.9  CL 99 99 97* 101 100  CO2 26 22 25 23 24   GLUCOSE 143* 126* 163* 108* 96  BUN 38* 35* 36* 43* 39*  CREATININE 0.96 0.89 0.98 1.07* 1.02*  CALCIUM 9.5 9.8 9.9 10.1 9.7   GFR CrCl cannot be calculated (Unknown ideal weight.). Liver Function Tests: Recent Labs  Lab 07/25/21 1513 07/26/21 0441  AST 68* 84*  ALT 35 40  ALKPHOS 47 47  BILITOT 1.0 1.3*  PROT 5.6* 5.3*  ALBUMIN 3.3* 3.2*   No results for input(s): LIPASE, AMYLASE in the last 168 hours. No results for input(s): AMMONIA in the last 168 hours. Coagulation profile No results for input(s): INR, PROTIME in the last 168 hours. COVID-19 Labs  No results for  input(s): DDIMER, FERRITIN, LDH, CRP in the last 72 hours.  Lab Results  Component Value Date   SARSCOV2NAA NEGATIVE 07/23/2021   SARSCOV2NAA NEGATIVE 07/19/2021   [place] NEGATIVE 04/17/2020   [place] NEGATIVE 04/10/2020    CBC: Recent Labs  Lab 07/22/21 0052 07/23/21 0425 07/24/21 0110 07/25/21 1513 07/26/21 0441  WBC 9.6 10.4 10.2 12.6* 11.7*  HGB 13.7 14.9 14.7 14.9 14.6  HCT 41.8 43.6 42.6 42.9 42.4  MCV 93.1 90.8 91.0 90.7 89.6  PLT 218 227 217 236 258   Cardiac Enzymes: No results for input(s): CKTOTAL, CKMB, CKMBINDEX, TROPONINI in the last 168 hours. BNP (last 3 results) No results for input(s): PROBNP in the last 8760 hours. CBG: Recent Labs  Lab  07/22/21 0818 07/27/21 0841  GLUCAP 115* 169*   D-Dimer: No results for input(s): DDIMER in the last 72 hours. Hgb A1c: No results for input(s): HGBA1C in the last 72 hours. Lipid Profile: No results for input(s): CHOL, HDL, LDLCALC, TRIG, CHOLHDL, LDLDIRECT in the last 72 hours. Thyroid function studies: No results for input(s): TSH, T4TOTAL, T3FREE, THYROIDAB in the last 72 hours.  Invalid input(s): FREET3 Anemia work up: No results for input(s): VITAMINB12, FOLATE, FERRITIN, TIBC, IRON, RETICCTPCT in the last 72 hours. Sepsis Labs: Recent Labs  Lab 07/23/21 0425 07/24/21 0110 07/25/21 1513 07/26/21 0441  WBC 10.4 10.2 12.6* 11.7*   Microbiology Recent Results (from the past 240 hour(s))  Resp Panel by RT-PCR (Flu A&B, Covid) Nasopharyngeal Swab     Status: Abnormal   Collection Time: 07/19/21  2:52 PM   Specimen: Nasopharyngeal Swab; Nasopharyngeal(NP) swabs in vial transport medium  Result Value Ref Range Status   SARS Coronavirus 2 by RT PCR NEGATIVE NEGATIVE Final    Comment: (NOTE) SARS-CoV-2 target nucleic acids are NOT DETECTED.  The SARS-CoV-2 RNA is generally detectable in upper respiratory specimens during the acute phase of infection. The lowest concentration of SARS-CoV-2  viral copies this assay can detect is 138 copies/mL. A negative result does not preclude SARS-Cov-2 infection and should not be used as the sole basis for treatment or other patient management decisions. A negative result may occur with  improper specimen collection/handling, submission of specimen other than nasopharyngeal swab, presence of viral mutation(s) within the areas targeted by this assay, and inadequate number of viral copies(<138 copies/mL). A negative result must be combined with clinical observations, patient history, and epidemiological information. The expected result is Negative.  Fact Sheet for Patients:  EntrepreneurPulse.com.au  Fact Sheet for Healthcare Providers:  IncredibleEmployment.be  This test is no t yet approved or cleared by the [place] FDA and  has been authorized for detection and/or diagnosis of SARS-CoV-2 by FDA under an Emergency Use Authorization (EUA). This EUA will remain  in effect (meaning this test can be used) for the duration of the COVID-19 declaration under Section 564(b)(1) of the Act, 21 U.S.C.section 360bbb-3(b)(1), unless the authorization is terminated  or revoked sooner.       Influenza A by PCR POSITIVE (A) NEGATIVE Final   Influenza B by PCR NEGATIVE NEGATIVE Final    Comment: (NOTE) The Xpert Xpress SARS-CoV-2/FLU/RSV plus assay is intended as an aid in the diagnosis of influenza from Nasopharyngeal swab specimens and should not be used as a sole basis for treatment. Nasal washings and aspirates are unacceptable for Xpert Xpress SARS-CoV-2/FLU/RSV testing.  Fact Sheet for Patients: EntrepreneurPulse.com.au  Fact Sheet for Healthcare Providers: IncredibleEmployment.be  This test is not yet approved or cleared by the [place] FDA and has been authorized for detection and/or diagnosis of SARS-CoV-2 by FDA under an Emergency Use Authorization  (EUA). This EUA will remain in effect (meaning this test can be used) for the duration of the COVID-19 declaration under Section 564(b)(1) of the Act, 21 U.S.C. section 360bbb-3(b)(1), unless the authorization is terminated or revoked.  Performed at [facility], [place] [address] [place], [place] [address]   MRSA Next Gen by PCR, Nasal     Status: None   Collection Time: 07/20/21  8:46 PM   Specimen: Nasal Mucosa; Nasal Swab  Result Value Ref Range Status   MRSA by PCR Next Gen NOT DETECTED NOT DETECTED Final    Comment: (NOTE) The GeneXpert MRSA  Assay (FDA approved for NASAL specimens only), is one component of a comprehensive MRSA colonization surveillance program. It is not intended to diagnose MRSA infection nor to guide or monitor treatment for MRSA infections. Test performance is not FDA approved in patients less than 16 years old. Performed at [facility], [place] [address], [place], [place] [address]   SARS CORONAVIRUS 2 (TAT 6-24 HRS) Nasopharyngeal Nasopharyngeal Swab     Status: None   Collection Time: 07/23/21 11:44 AM   Specimen: Nasopharyngeal Swab  Result Value Ref Range Status   SARS Coronavirus 2 NEGATIVE NEGATIVE Final    Comment: (NOTE) SARS-CoV-2 target nucleic acids are NOT DETECTED.  The SARS-CoV-2 RNA is generally detectable in upper and lower respiratory specimens during the acute phase of infection. Negative results do not preclude SARS-CoV-2 infection, do not rule out co-infections with other pathogens, and should not be used as the sole basis for treatment or other patient management decisions. Negative results must be combined with clinical observations, patient history, and epidemiological information. The expected result is Negative.  Fact Sheet for Patients: SugarRoll.be  Fact Sheet for Healthcare Providers: https://www.woods-mathews.com/  This test is not yet approved or cleared by the  [place] FDA and  has been authorized for detection and/or diagnosis of SARS-CoV-2 by FDA under an Emergency Use Authorization (EUA). This EUA will remain  in effect (meaning this test can be used) for the duration of the COVID-19 declaration under Se ction 564(b)(1) of the Act, 21 U.S.C. section 360bbb-3(b)(1), unless the authorization is terminated or revoked sooner.  Performed at [facility], [place] [address], [place], [place] [address]      Medications:    allopurinol  100 mg Oral Daily   aspirin EC  81 mg Oral Daily   benzonatate  200 mg Oral TID   budesonide (PULMICORT) nebulizer solution  0.25 mg Nebulization BID   diclofenac Sodium  2 g Topical TID   enoxaparin (LOVENOX) injection  30 mg Subcutaneous Q24H   guaiFENesin  600 mg Oral BID   hydrALAZINE  100 mg Oral Q8H   hydrochlorothiazide  25 mg Oral Daily   ipratropium-albuterol  3 mL Nebulization TID   labetalol  50 mg Oral BID   levothyroxine  50 mcg Oral QAC breakfast   methylPREDNISolone (SOLU-MEDROL) injection  40 mg Intravenous Q24H   omega-3 acid ethyl esters  1 g Oral QODAY   pantoprazole  20 mg Oral Daily   pregabalin  50 mg Oral Daily   sertraline  25 mg Oral Daily   Continuous Infusions:    LOS: 7 days   Charlynne Cousins  [facility]  07/27/2021, 10:43 AM

## 2021-07-27 NOTE — Plan of Care (Signed)

## 2021-07-27 NOTE — TOC Progression Note (Addendum)
Transition of Care [facility]) - Progression Note    Patient Details  Name: [name] MRN: [id number] Date of Birth: 07-29-1929  Transition of [facility]) CM/SW Contact  Joanne Chars, LCSW Phone Number: 07/27/2021, 11:37 AM  Clinical Narrative:   CSW confirmed with Claiborne Billings at [facility] that they can receive pt today.  MD notified.  Will need new covid test.    1430: TC [facility] at [facility].  Needed DC summary now.  Will need to wait until tomorrow.     Expected Discharge Plan: Long Term Nursing Home Barriers to Discharge: Continued Medical Work up  Expected Discharge Plan and Services Expected Discharge Plan: [place] Acute Care Choice: [place] arrangements for the past 2 months: [place]                                       Social Determinants of Health (SDOH) Interventions    Readmission Risk Interventions No flowsheet data found.

## 2021-07-27 NOTE — Progress Notes (Signed)
Occupational Therapy Treatment Patient Details Name: [name] MRN: [id number] DOB: 12-12-1928 Today's Date: 07/27/2021   History of present illness Pt is a 87 y.o. female admitted from [facility] 12/4 with AMS, influenza+. PMH:  HTN, HLD, h/o stroke, CAD, CKD stage II, OA, hearing loss   OT comments  Pt not progressing toward goals this session, increasingly fatigued during session. Pt unable to respond to orientation questions or follow single step verbal commands during session, requiring max A for bed level ADLs. Pt continues to be limited by decreased cognition and activity tolerance at this time, affecting progression with UB ADLs and functional mobility. Will continue to follow acutely, continue SNF recommendation at d/c.   Recommendations for follow up therapy are one component of a multi-disciplinary discharge planning process, led by the attending physician.  Recommendations may be updated based on patient status, additional functional criteria and insurance authorization.    Follow Up Recommendations  Skilled nursing-short term rehab (<3 hours/day)    Assistance Recommended at Discharge Intermittent Supervision/Assistance  Equipment Recommendations  None recommended by OT;Other (comment) (TBD at next venue of care)    Recommendations for Other Services      Precautions / Restrictions Precautions Precautions: Fall;Other (comment) Precaution Comments: bradycardia Restrictions Weight Bearing Restrictions: No       Mobility Bed Mobility               General bed mobility comments: deferred bed mobility this session    Transfers                   General transfer comment: transfers via hoyer lift at baseline     Balance                                           ADL either performed or assessed with clinical judgement   ADL Overall ADL's : Needs assistance/impaired     Grooming: Maximal assistance;Bed level Grooming  Details (indicate cue type and reason): max A to wash face/brush hair at bed level                                    Extremity/Trunk Assessment Upper Extremity Assessment Upper Extremity Assessment: Generalized weakness (pt unable to participate in grip strength testing during session)   Lower Extremity Assessment Lower Extremity Assessment: Defer to PT evaluation        Vision   Vision Assessment?: No apparent visual deficits   Perception Perception Perception: Not tested   Praxis Praxis Praxis: Not tested    Cognition Arousal/Alertness: Lethargic Behavior During Therapy: Flat affect Overall Cognitive Status: Difficult to assess                                 General Comments: pt unable to follow single step cues during session, makes eye contact with therapist and groans but does not respond to orientation questions.          Exercises     Shoulder Instructions       General Comments      Pertinent Vitals/ Pain       Pain Assessment: Faces Pain Score: 4  Pain Location: generalized Pain Descriptors / Indicators: Constant;Discomfort;Grimacing Pain Intervention(s): Limited activity within patient's  tolerance;Monitored during session;Repositioned  Home Living                                          Prior Functioning/Environment              Frequency  Min 1X/week        Progress Toward Goals  OT Goals(current goals can now be found in the care plan section)  Progress towards OT goals: OT to reassess next treatment  ADL Goals Pt Will Perform Eating: with set-up;bed level Pt Will Perform Grooming: with set-up;bed level Pt Will Perform Upper Body Dressing: with min assist;with mod assist;bed level  Plan Discharge plan remains appropriate;Frequency needs to be updated    Co-evaluation                 AM-PAC OT "6 Clicks" Daily Activity     Outcome Measure   Help from another person  eating meals?: A Lot Help from another person taking care of personal grooming?: A Lot Help from another person toileting, which includes using toliet, bedpan, or urinal?: Total Help from another person bathing (including washing, rinsing, drying)?: Total Help from another person to put on and taking off regular upper body clothing?: Total Help from another person to put on and taking off regular lower body clothing?: Total 6 Click Score: 8    End of Session Equipment Utilized During Treatment: Oxygen  OT Visit Diagnosis: Muscle weakness (generalized) (M62.81)   Activity Tolerance Patient limited by fatigue;Patient limited by lethargy   Patient Left in bed;with call bell/phone within reach;with bed alarm set   Nurse Communication Mobility status        Time: 8937-3428 OT Time Calculation (min): 9 min  Charges: OT General Charges $OT Visit: 1 Visit OT Treatments $Self Care/Home Management : 8-22 mins  Lynnda Child, OTD, OTR/L Acute Rehab [phone] - [place] 07/27/2021, 9:26 AM

## 2021-07-28 DIAGNOSIS — N179 Acute kidney failure, unspecified: Secondary | ICD-10-CM | POA: Diagnosis not present

## 2021-07-28 DIAGNOSIS — J111 Influenza due to unidentified influenza virus with other respiratory manifestations: Secondary | ICD-10-CM | POA: Diagnosis not present

## 2021-07-28 DIAGNOSIS — I10 Essential (primary) hypertension: Secondary | ICD-10-CM

## 2021-07-28 DIAGNOSIS — Z7401 Bed confinement status: Secondary | ICD-10-CM | POA: Diagnosis not present

## 2021-07-28 DIAGNOSIS — M2550 Pain in unspecified joint: Secondary | ICD-10-CM | POA: Diagnosis not present

## 2021-07-28 DIAGNOSIS — J9601 Acute respiratory failure with hypoxia: Secondary | ICD-10-CM | POA: Diagnosis not present

## 2021-07-28 LAB — RESP PANEL BY RT-PCR (FLU A&B, COVID) ARPGX2
Influenza A by PCR: POSITIVE — AB
Influenza B by PCR: NEGATIVE
SARS Coronavirus 2 by RT PCR: NEGATIVE

## 2021-07-28 MED ORDER — ALUM & MAG HYDROXIDE-SIMETH 200-200-20 MG/5ML PO SUSP
30.0000 mL | ORAL | Status: DC | PRN
  Administered 2021-07-28: 30 mL via TOPICAL
  Filled 2021-07-28: qty 30

## 2021-07-28 MED ORDER — ALBUTEROL SULFATE (2.5 MG/3ML) 0.083% IN NEBU: 2.5000 mg | INHALATION_SOLUTION | RESPIRATORY_TRACT | 2 refills | Status: AC | PRN

## 2021-07-28 MED ORDER — MELATONIN 3 MG PO TABS: 3.0000 mg | ORAL_TABLET | Freq: Every day | ORAL | 0 refills | Status: DC

## 2021-07-28 NOTE — TOC Transition Note (Addendum)
Transition of Care [facility]) - CM/SW Discharge Note   Patient Details  Name: [name] MRN: [id number] Date of Birth: 1928-12-06  Transition of Care [facility]) CM/SW Contact:  Joanne Chars, LCSW Phone Number: 07/28/2021, 9:56 AM   Clinical Narrative:   Pt discharging to [facility].  RN call report to [phone].  Pt pending covid result, transport has not yet been called.  Son requesting update from MD, who was notified.    1255: Covid test negative.  PTAR called.    Final next level of care: Long Term Nursing Home Barriers to Discharge: Barriers Resolved   Patient Goals and CMS Choice        Discharge Placement              Patient chooses bed at:  [facility]) Patient to be transferred to facility by: [place] Name of family member notified: son [name] Patient and family notified of of transfer: 07/28/21  Discharge Plan and Services     Post Acute Care Choice: [place]                               Social Determinants of Health (SDOH) Interventions     Readmission Risk Interventions No flowsheet data found.

## 2021-07-28 NOTE — Care Management Important Message (Signed)
Important Message  Patient Details  Name: [name] MRN: [id number] Date of Birth: [birth year]   Medicare Important Message Given:  Yes     Hannah Beat 07/28/2021, 1:53 PM

## 2021-07-28 NOTE — Progress Notes (Signed)
Discharge summary packet/pertinent documents provided to PTAR. Pt d/c to [facility] as tolerated. Pt remains alert in no apparent distress.

## 2021-07-28 NOTE — Discharge Summary (Addendum)
Physician Discharge Summary  [name] TJQ:[id number] DOB: June 20, 1929 DOA: 07/19/2021  PCP: Garwin Brothers, MD  Admit date: 07/19/2021 Discharge date: 07/28/2021  Admitted From: SNF Disposition:  SNF  Recommendations for Outpatient Follow-up:  Follow up with PCP in 1-2 weeks Try to wean her off the oxygen in 2 weeks Palliative care to meet with family at facility to discuss end of life.   Home Health:No Equipment/Devices:None  Discharge Condition:Stable CODE STATUS:DNR Diet recommendation: Heart Healthy   Brief/Interim Summary: 87 y.o. female past medical history of essential hypertension, chronic kidney disease stage II sent from nursing home for altered mental status, according to staff she has been having a hacking cough was hypoxic at 85% placed on 3 L of oxygen influenza PCR came back positive started on Tamiflu.    Discharge Diagnoses:  Principal Problem:   Influenza Active Problems:   Hypertension   CAD (coronary artery disease)   ARF (acute renal failure) (HCC)   Hyperlipidemia   History of stroke   Acute respiratory failure (HCC)  Acute respiratory failure with hypoxia due to influenza pneumonia: She was started on Tamiflu as her PCR was positive. She is still requiring 2 L of oxygen which she will go to nurses facility with and will wean her off the oxygen over the next 2 weeks.  Acute metabolic encephalopathy: CT scan of the head showed no acute findings she is at high risk of delirium and aspiration. This resolved after treating infection.  Essential hypertension: Initially antihypertensive medications were held she will resume them as an outpatient.  History of coronary artery disease/CVA: Continue home aspirin has remained asymptomatic.  Sinus bradycardia: Remains sinus rhythm.  Hypothyroidism: Continue Synthroid.  Acute kidney injury on chronic kidney disease 3A: Resolved with IV fluid hydration likely prerenal azotemia.  Ambulatory  dysfunction: She is nonambulatory.  Hx of CVA: Cont meds.  Sacral decubitus ulcer stage II on the left buttock present on admission  Discharge Instructions  Discharge Instructions     Diet - low sodium heart healthy   Complete by: As directed    Increase activity slowly   Complete by: As directed       Allergies as of 07/28/2021       Reactions   Felodipine Anaphylaxis, Other (See Comments)   Reaction: unknown possible nausea or rash   Prednisone Other (See Comments)   Unknown reaction   Shrimp [shellfish Allergy] Nausea And Vomiting   Codeine    unknown   Crestor [rosuvastatin]    Unknown   Feldene [piroxicam]    Unknown   Lescol [fluvastatin]    Unknown   Lipitor [atorvastatin]    Unknown   Nexium [esomeprazole]    Unknown   Pravachol [pravastatin]    Unknown   Prilosec Otc [omeprazole Magnesium]    Unknown   Statins Other (See Comments)   Muscle/leg pain.   Welchol [colesevelam]    Unknown   Zetia [ezetimibe]    Unknown   Zithromax [azithromycin]    Unknown   Zocor [simvastatin]    Unknown   Omeprazole Other (See Comments)   Reaction: unknown possible nausea or rash        Medication List     STOP taking these medications    acetaminophen 650 MG CR tablet Commonly known as: TYLENOL   albuterol 108 (90 Base) MCG/ACT inhaler Commonly known as: VENTOLIN HFA Replaced by: albuterol (2.5 MG/3ML) 0.083% nebulizer solution   ALPRAZolam 0.25 MG tablet Commonly known as: Duanne Moron  Fish Oil 1000 MG Caps   nitrofurantoin 100 MG capsule Commonly known as: MACRODANTIN   pantoprazole 20 MG tablet Commonly known as: PROTONIX   PRESERVISION AREDS PO       TAKE these medications    albuterol (2.5 MG/3ML) 0.083% nebulizer solution Commonly known as: PROVENTIL Take 3 mLs (2.5 mg total) by nebulization every 4 (four) hours as needed for wheezing or shortness of breath. Replaces: albuterol 108 (90 Base) MCG/ACT inhaler   allopurinol 100 MG  tablet Commonly known as: ZYLOPRIM Take 100 mg by mouth daily.   aspirin EC 81 MG tablet Take 1 tablet (81 mg total) by mouth daily. Swallow whole.   diclofenac Sodium 1 % Gel Commonly known as: VOLTAREN Apply 2 g topically 3 (three) times daily. Apply to knees   furosemide 20 MG tablet Commonly known as: LASIX Take 20 mg by mouth daily.   labetalol 100 MG tablet Commonly known as: NORMODYNE Take 1 tablet (100 mg total) by mouth 2 (two) times daily. What changed: how much to take   levothyroxine 75 MCG tablet Commonly known as: SYNTHROID Take 75 mcg by mouth daily before breakfast.   lisinopril 40 MG tablet Commonly known as: ZESTRIL Take 40 mg by mouth daily.   melatonin 3 MG Tabs tablet Take 1 tablet (3 mg total) by mouth at bedtime.   pregabalin 50 MG capsule Commonly known as: LYRICA Take 50 mg by mouth daily.   sertraline 25 MG tablet Commonly known as: ZOLOFT Take 25 mg by mouth daily.        Allergies  Allergen Reactions   Felodipine Anaphylaxis and Other (See Comments)    Reaction: unknown possible nausea or rash   Prednisone Other (See Comments)    Unknown reaction   Shrimp [Shellfish Allergy] Nausea And Vomiting   Codeine     unknown   Crestor [Rosuvastatin]     Unknown   Feldene [Piroxicam]     Unknown   Lescol [Fluvastatin]     Unknown   Lipitor [Atorvastatin]     Unknown   Nexium [Esomeprazole]     Unknown   Pravachol [Pravastatin]     Unknown   Prilosec Otc [Omeprazole Magnesium]     Unknown   Statins Other (See Comments)    Muscle/leg pain.   Welchol [Colesevelam]     Unknown   Zetia [Ezetimibe]     Unknown   Zithromax [Azithromycin]     Unknown   Zocor [Simvastatin]     Unknown   Omeprazole Other (See Comments)    Reaction: unknown possible nausea or rash    Consultations: None   Procedures/Studies: DG Chest 1 View  Result Date: 07/24/2021 CLINICAL DATA:  Shortness of breath. EXAM: CHEST  1 VIEW COMPARISON:   July 19, 2021. FINDINGS: Stable cardiomediastinal silhouette. Right lung is clear. Stable minimal left basilar subsegmental atelectasis is noted. Bony thorax is unremarkable. IMPRESSION: Stable minimal left basilar subsegmental atelectasis. Electronically Signed   By: Marijo Conception M.D.   On: 07/24/2021 10:47   CT Head Wo Contrast  Result Date: 07/19/2021 CLINICAL DATA:  Mental status change. EXAM: CT HEAD WITHOUT CONTRAST TECHNIQUE: Contiguous axial images were obtained from the base of the skull through the vertex without intravenous contrast. COMPARISON:  April 17, 2020 FINDINGS: Brain: No evidence of acute infarction, hemorrhage, hydrocephalus, extra-axial collection or mass lesion/mass effect. Chronic left MCA territory infarct again seen. Vascular: No hyperdense vessel or unexpected calcification. Skull: Normal. Negative for fracture or focal  lesion. Sinuses/Orbits: No acute finding. Other: None. IMPRESSION: 1. No acute intracranial abnormality. 2. Chronic left MCA territory infarct. Electronically Signed   By: Fidela Salisbury M.D.   On: 07/19/2021 16:35   DG Chest Port 1 View  Result Date: 07/19/2021 CLINICAL DATA:  87 year old female presents for evaluation of shortness of breath and cough. EXAM: PORTABLE CHEST 1 VIEW COMPARISON:  Comparison is made with examination from April 08, 2020. FINDINGS: Cardiomediastinal contours and hilar structures are stable with mild cardiac enlargement and signs of aortic atherosclerosis. Increased retrocardiac density may represent a combination of airspace disease and known hiatal hernia in the LEFT chest. No additional signs of focal airspace opacity. EKG leads project over the chest. On limited assessment there is no acute skeletal process. IMPRESSION: Increased retrocardiac density may represent a combination of airspace disease and known hiatal hernia in the LEFT chest. Correlate with any signs of pneumonia. Electronically Signed   By: Zetta Bills  M.D.   On: 07/19/2021 16:12   (Echo, Carotid, EGD, Colonoscopy, ERCP)    Subjective: Nonverbal this morning  Discharge Exam: Vitals:   07/28/21 0456 07/28/21 0802  BP: (!) 131/53 119/63  Pulse: (!) 55 (!) 59  Resp: 16 17  Temp: 98.6 F (37 C) 98.1 F (36.7 C)  SpO2: 98% 98%   Vitals:   07/27/21 1956 07/27/21 2111 07/28/21 0456 07/28/21 0802  BP:  (!) 149/67 (!) 131/53 119/63  Pulse: 74 63 (!) 55 (!) 59  Resp: 16 (!) 21 16 17   Temp:  98.6 F (37 C) 98.6 F (37 C) 98.1 F (36.7 C)  TempSrc:  Oral Oral Oral  SpO2: 98% 98% 98% 98%  Weight:      Height:    5\' 3"  (1.6 m)    General: Pt is alert, awake, not in acute distress Cardiovascular: RRR, S1/S2 +, no rubs, no gallops Respiratory: CTA bilaterally, no wheezing, no rhonchi Abdominal: Soft, NT, ND, bowel sounds + Extremities: no edema, no cyanosis    The results of significant diagnostics from this hospitalization (including imaging, microbiology, ancillary and laboratory) are listed below for reference.     Microbiology: Recent Results (from the past 240 hour(s))  Resp Panel by RT-PCR (Flu A&B, Covid) Nasopharyngeal Swab     Status: Abnormal   Collection Time: 07/19/21  2:52 PM   Specimen: Nasopharyngeal Swab; Nasopharyngeal(NP) swabs in vial transport medium  Result Value Ref Range Status   SARS Coronavirus 2 by RT PCR NEGATIVE NEGATIVE Final    Comment: (NOTE) SARS-CoV-2 target nucleic acids are NOT DETECTED.  The SARS-CoV-2 RNA is generally detectable in upper respiratory specimens during the acute phase of infection. The lowest concentration of SARS-CoV-2 viral copies this assay can detect is 138 copies/mL. A negative result does not preclude SARS-Cov-2 infection and should not be used as the sole basis for treatment or other patient management decisions. A negative result may occur with  improper specimen collection/handling, submission of specimen other than nasopharyngeal swab, presence of viral  mutation(s) within the areas targeted by this assay, and inadequate number of viral copies(<138 copies/mL). A negative result must be combined with clinical observations, patient history, and epidemiological information. The expected result is Negative.  Fact Sheet for Patients:  EntrepreneurPulse.com.au  Fact Sheet for Healthcare Providers:  IncredibleEmployment.be  This test is no t yet approved or cleared by the [place] FDA and  has been authorized for detection and/or diagnosis of SARS-CoV-2 by FDA under an Emergency Use Authorization (EUA). This EUA  will remain  in effect (meaning this test can be used) for the duration of the COVID-19 declaration under Section 564(b)(1) of the Act, 21 U.S.C.section 360bbb-3(b)(1), unless the authorization is terminated  or revoked sooner.       Influenza A by PCR POSITIVE (A) NEGATIVE Final   Influenza B by PCR NEGATIVE NEGATIVE Final    Comment: (NOTE) The Xpert Xpress SARS-CoV-2/FLU/RSV plus assay is intended as an aid in the diagnosis of influenza from Nasopharyngeal swab specimens and should not be used as a sole basis for treatment. Nasal washings and aspirates are unacceptable for Xpert Xpress SARS-CoV-2/FLU/RSV testing.  Fact Sheet for Patients: EntrepreneurPulse.com.au  Fact Sheet for Healthcare Providers: IncredibleEmployment.be  This test is not yet approved or cleared by the [place] FDA and has been authorized for detection and/or diagnosis of SARS-CoV-2 by FDA under an Emergency Use Authorization (EUA). This EUA will remain in effect (meaning this test can be used) for the duration of the COVID-19 declaration under Section 564(b)(1) of the Act, 21 U.S.C. section 360bbb-3(b)(1), unless the authorization is terminated or revoked.  Performed at [facility], [place] [address] [place], [place] [address]   MRSA Next Gen by PCR, Nasal      Status: None   Collection Time: 07/20/21  8:46 PM   Specimen: Nasal Mucosa; Nasal Swab  Result Value Ref Range Status   MRSA by PCR Next Gen NOT DETECTED NOT DETECTED Final    Comment: (NOTE) The GeneXpert MRSA Assay (FDA approved for NASAL specimens only), is one component of a comprehensive MRSA colonization surveillance program. It is not intended to diagnose MRSA infection nor to guide or monitor treatment for MRSA infections. Test performance is not FDA approved in patients less than 38 years old. Performed at [facility], [place] [address] [place], [place] [address]   SARS CORONAVIRUS 2 (TAT 6-24 HRS) Nasopharyngeal Nasopharyngeal Swab     Status: None   Collection Time: 07/23/21 11:44 AM   Specimen: Nasopharyngeal Swab  Result Value Ref Range Status   SARS Coronavirus 2 NEGATIVE NEGATIVE Final    Comment: (NOTE) SARS-CoV-2 target nucleic acids are NOT DETECTED.  The SARS-CoV-2 RNA is generally detectable in upper and lower respiratory specimens during the acute phase of infection. Negative results do not preclude SARS-CoV-2 infection, do not rule out co-infections with other pathogens, and should not be used as the sole basis for treatment or other patient management decisions. Negative results must be combined with clinical observations, patient history, and epidemiological information. The expected result is Negative.  Fact Sheet for Patients: SugarRoll.be  Fact Sheet for Healthcare Providers: https://www.woods-mathews.com/  This test is not yet approved or cleared by the [place] FDA and  has been authorized for detection and/or diagnosis of SARS-CoV-2 by FDA under an Emergency Use Authorization (EUA). This EUA will remain  in effect (meaning this test can be used) for the duration of the COVID-19 declaration under Se ction 564(b)(1) of the Act, 21 U.S.C. section 360bbb-3(b)(1), unless the authorization is  terminated or revoked sooner.  Performed at [facility], [place] [address] [place], [place] [address]      Labs: BNP (last 3 results) Recent Labs    07/19/21 2118  BNP 914.7*   Basic Metabolic Panel: Recent Labs  Lab 07/22/21 0052 07/23/21 0425 07/24/21 0110 07/25/21 1513 07/26/21 0441  NA 136 130* 132* 134* 133*  K 4.1 4.4 4.2 4.0 3.9  CL 99 99 97* 101 100  CO2 26 22 25 23 24   GLUCOSE 143* 126* 163* 108* 96  BUN 38* 35* 36* 43* 39*  CREATININE 0.96 0.89 0.98 1.07* 1.02*  CALCIUM 9.5 9.8 9.9 10.1 9.7   Liver Function Tests: Recent Labs  Lab 07/25/21 1513 07/26/21 0441  AST 68* 84*  ALT 35 40  ALKPHOS 47 47  BILITOT 1.0 1.3*  PROT 5.6* 5.3*  ALBUMIN 3.3* 3.2*   No results for input(s): LIPASE, AMYLASE in the last 168 hours. No results for input(s): AMMONIA in the last 168 hours. CBC: Recent Labs  Lab 07/22/21 0052 07/23/21 0425 07/24/21 0110 07/25/21 1513 07/26/21 0441  WBC 9.6 10.4 10.2 12.6* 11.7*  HGB 13.7 14.9 14.7 14.9 14.6  HCT 41.8 43.6 42.6 42.9 42.4  MCV 93.1 90.8 91.0 90.7 89.6  PLT 218 227 217 236 258   Cardiac Enzymes: No results for input(s): CKTOTAL, CKMB, CKMBINDEX, TROPONINI in the last 168 hours. BNP: Invalid input(s): POCBNP CBG: Recent Labs  Lab 07/22/21 0818 07/27/21 0841 07/27/21 1150 07/27/21 1637  GLUCAP 115* 169* 156* 121*   D-Dimer No results for input(s): DDIMER in the last 72 hours. Hgb A1c No results for input(s): HGBA1C in the last 72 hours. Lipid Profile No results for input(s): CHOL, HDL, LDLCALC, TRIG, CHOLHDL, LDLDIRECT in the last 72 hours. Thyroid function studies No results for input(s): TSH, T4TOTAL, T3FREE, THYROIDAB in the last 72 hours.  Invalid input(s): FREET3 Anemia work up No results for input(s): VITAMINB12, FOLATE, FERRITIN, TIBC, IRON, RETICCTPCT in the last 72 hours. Urinalysis    Component Value Date/Time   COLORURINE YELLOW 04/08/2020 0420   APPEARANCEUR CLEAR 04/08/2020 0420    LABSPEC 1.026 04/08/2020 0420   PHURINE 7.0 04/08/2020 0420   GLUCOSEU NEGATIVE 04/08/2020 0420   HGBUR NEGATIVE 04/08/2020 0420   BILIRUBINUR NEGATIVE 04/08/2020 0420   KETONESUR 5 (A) 04/08/2020 0420   PROTEINUR NEGATIVE 04/08/2020 0420   UROBILINOGEN 0.2 08/02/2013 1342   NITRITE POSITIVE (A) 04/08/2020 0420   LEUKOCYTESUR LARGE (A) 04/08/2020 0420   Sepsis Labs Invalid input(s): PROCALCITONIN,  WBC,  LACTICIDVEN Microbiology Recent Results (from the past 240 hour(s))  Resp Panel by RT-PCR (Flu A&B, Covid) Nasopharyngeal Swab     Status: Abnormal   Collection Time: 07/19/21  2:52 PM   Specimen: Nasopharyngeal Swab; Nasopharyngeal(NP) swabs in vial transport medium  Result Value Ref Range Status   SARS Coronavirus 2 by RT PCR NEGATIVE NEGATIVE Final    Comment: (NOTE) SARS-CoV-2 target nucleic acids are NOT DETECTED.  The SARS-CoV-2 RNA is generally detectable in upper respiratory specimens during the acute phase of infection. The lowest concentration of SARS-CoV-2 viral copies this assay can detect is 138 copies/mL. A negative result does not preclude SARS-Cov-2 infection and should not be used as the sole basis for treatment or other patient management decisions. A negative result may occur with  improper specimen collection/handling, submission of specimen other than nasopharyngeal swab, presence of viral mutation(s) within the areas targeted by this assay, and inadequate number of viral copies(<138 copies/mL). A negative result must be combined with clinical observations, patient history, and epidemiological information. The expected result is Negative.  Fact Sheet for Patients:  EntrepreneurPulse.com.au  Fact Sheet for Healthcare Providers:  IncredibleEmployment.be  This test is no t yet approved or cleared by the [place] FDA and  has been authorized for detection and/or diagnosis of SARS-CoV-2 by FDA under an Emergency  Use Authorization (EUA). This EUA will remain  in effect (meaning this test can  be used) for the duration of the COVID-19 declaration under Section 564(b)(1) of the Act, 21 U.S.C.section 360bbb-3(b)(1), unless the authorization is terminated  or revoked sooner.       Influenza A by PCR POSITIVE (A) NEGATIVE Final   Influenza B by PCR NEGATIVE NEGATIVE Final    Comment: (NOTE) The Xpert Xpress SARS-CoV-2/FLU/RSV plus assay is intended as an aid in the diagnosis of influenza from Nasopharyngeal swab specimens and should not be used as a sole basis for treatment. Nasal washings and aspirates are unacceptable for Xpert Xpress SARS-CoV-2/FLU/RSV testing.  Fact Sheet for Patients: EntrepreneurPulse.com.au  Fact Sheet for Healthcare Providers: IncredibleEmployment.be  This test is not yet approved or cleared by the [place] FDA and has been authorized for detection and/or diagnosis of SARS-CoV-2 by FDA under an Emergency Use Authorization (EUA). This EUA will remain in effect (meaning this test can be used) for the duration of the COVID-19 declaration under Section 564(b)(1) of the Act, 21 U.S.C. section 360bbb-3(b)(1), unless the authorization is terminated or revoked.  Performed at [facility], [place] [address] [place], [place] [address]   MRSA Next Gen by PCR, Nasal     Status: None   Collection Time: 07/20/21  8:46 PM   Specimen: Nasal Mucosa; Nasal Swab  Result Value Ref Range Status   MRSA by PCR Next Gen NOT DETECTED NOT DETECTED Final    Comment: (NOTE) The GeneXpert MRSA Assay (FDA approved for NASAL specimens only), is one component of a comprehensive MRSA colonization surveillance program. It is not intended to diagnose MRSA infection nor to guide or monitor treatment for MRSA infections. Test performance is not FDA approved in patients less than 76 years old. Performed at [facility], [place] [address]  [place], [place] [address]   SARS CORONAVIRUS 2 (TAT 6-24 HRS) Nasopharyngeal Nasopharyngeal Swab     Status: None   Collection Time: 07/23/21 11:44 AM   Specimen: Nasopharyngeal Swab  Result Value Ref Range Status   SARS Coronavirus 2 NEGATIVE NEGATIVE Final    Comment: (NOTE) SARS-CoV-2 target nucleic acids are NOT DETECTED.  The SARS-CoV-2 RNA is generally detectable in upper and lower respiratory specimens during the acute phase of infection. Negative results do not preclude SARS-CoV-2 infection, do not rule out co-infections with other pathogens, and should not be used as the sole basis for treatment or other patient management decisions. Negative results must be combined with clinical observations, patient history, and epidemiological information. The expected result is Negative.  Fact Sheet for Patients: SugarRoll.be  Fact Sheet for Healthcare Providers: https://www.woods-mathews.com/  This test is not yet approved or cleared by the [place] FDA and  has been authorized for detection and/or diagnosis of SARS-CoV-2 by FDA under an Emergency Use Authorization (EUA). This EUA will remain  in effect (meaning this test can be used) for the duration of the COVID-19 declaration under Se ction 564(b)(1) of the Act, 21 U.S.C. section 360bbb-3(b)(1), unless the authorization is terminated or revoked sooner.  Performed at [facility], [place] [address], [place], [place] [address]    SIGNED:   Charlynne Cousins, MD  [facility] 07/28/2021, 10:15 AM Pager   If 7PM-7AM, please contact night-coverage [email or web address] Password [email or web address]

## 2021-07-28 NOTE — Progress Notes (Signed)
Report given to [facility] nurse at [facility]. All questions and concerns were fully addressed.

## 2021-07-29 DIAGNOSIS — J111 Influenza due to unidentified influenza virus with other respiratory manifestations: Secondary | ICD-10-CM | POA: Diagnosis not present

## 2021-07-29 DIAGNOSIS — R0602 Shortness of breath: Secondary | ICD-10-CM | POA: Diagnosis not present

## 2021-07-29 DIAGNOSIS — R062 Wheezing: Secondary | ICD-10-CM | POA: Diagnosis not present

## 2021-07-29 DIAGNOSIS — J441 Chronic obstructive pulmonary disease with (acute) exacerbation: Secondary | ICD-10-CM | POA: Diagnosis not present

## 2021-07-30 DIAGNOSIS — J441 Chronic obstructive pulmonary disease with (acute) exacerbation: Secondary | ICD-10-CM | POA: Diagnosis not present

## 2021-07-30 DIAGNOSIS — R1312 Dysphagia, oropharyngeal phase: Secondary | ICD-10-CM | POA: Diagnosis not present

## 2021-07-30 DIAGNOSIS — R0602 Shortness of breath: Secondary | ICD-10-CM | POA: Diagnosis not present

## 2021-07-30 DIAGNOSIS — J449 Chronic obstructive pulmonary disease, unspecified: Secondary | ICD-10-CM | POA: Diagnosis not present

## 2021-07-30 DIAGNOSIS — R062 Wheezing: Secondary | ICD-10-CM | POA: Diagnosis not present

## 2021-07-30 DIAGNOSIS — R410 Disorientation, unspecified: Secondary | ICD-10-CM | POA: Diagnosis not present

## 2021-07-30 DIAGNOSIS — I639 Cerebral infarction, unspecified: Secondary | ICD-10-CM | POA: Diagnosis not present

## 2021-07-31 DIAGNOSIS — E785 Hyperlipidemia, unspecified: Secondary | ICD-10-CM | POA: Diagnosis not present

## 2021-08-03 DIAGNOSIS — R52 Pain, unspecified: Secondary | ICD-10-CM | POA: Diagnosis not present

## 2021-08-03 DIAGNOSIS — R69 Illness, unspecified: Secondary | ICD-10-CM | POA: Diagnosis not present

## 2021-08-03 DIAGNOSIS — B379 Candidiasis, unspecified: Secondary | ICD-10-CM | POA: Diagnosis not present

## 2021-08-03 DIAGNOSIS — R627 Adult failure to thrive: Secondary | ICD-10-CM | POA: Diagnosis not present

## 2021-09-16 DEATH — deceased
